# Patient Record
Sex: Female | Born: 1940 | Race: White | Hispanic: No | Marital: Married | Smoking: Former smoker
Health system: Southern US, Community
[De-identification: ages and names within clinical notes are randomized; demographics above are authoritative.]

## PROBLEM LIST (undated history)

## (undated) DIAGNOSIS — R739 Hyperglycemia, unspecified: Secondary | ICD-10-CM

## (undated) DIAGNOSIS — K5730 Diverticulosis of large intestine without perforation or abscess without bleeding: Secondary | ICD-10-CM

## (undated) DIAGNOSIS — K649 Unspecified hemorrhoids: Secondary | ICD-10-CM

## (undated) DIAGNOSIS — F0390 Unspecified dementia without behavioral disturbance: Secondary | ICD-10-CM

## (undated) DIAGNOSIS — K5140 Inflammatory polyps of colon without complications: Secondary | ICD-10-CM

## (undated) DIAGNOSIS — Z9221 Personal history of antineoplastic chemotherapy: Secondary | ICD-10-CM

## (undated) DIAGNOSIS — D509 Iron deficiency anemia, unspecified: Secondary | ICD-10-CM

## (undated) DIAGNOSIS — K589 Irritable bowel syndrome without diarrhea: Secondary | ICD-10-CM

## (undated) DIAGNOSIS — K2270 Barrett's esophagus without dysplasia: Secondary | ICD-10-CM

## (undated) DIAGNOSIS — IMO0002 Reserved for concepts with insufficient information to code with codable children: Secondary | ICD-10-CM

## (undated) DIAGNOSIS — K760 Fatty (change of) liver, not elsewhere classified: Secondary | ICD-10-CM

## (undated) DIAGNOSIS — I509 Heart failure, unspecified: Secondary | ICD-10-CM

## (undated) DIAGNOSIS — C50919 Malignant neoplasm of unspecified site of unspecified female breast: Secondary | ICD-10-CM

## (undated) DIAGNOSIS — M1990 Unspecified osteoarthritis, unspecified site: Secondary | ICD-10-CM

## (undated) DIAGNOSIS — K219 Gastro-esophageal reflux disease without esophagitis: Secondary | ICD-10-CM

## (undated) DIAGNOSIS — G4730 Sleep apnea, unspecified: Secondary | ICD-10-CM

## (undated) DIAGNOSIS — C189 Malignant neoplasm of colon, unspecified: Secondary | ICD-10-CM

## (undated) DIAGNOSIS — R740 Nonspecific elevation of levels of transaminase and lactic acid dehydrogenase [LDH]: Secondary | ICD-10-CM

## (undated) DIAGNOSIS — Z923 Personal history of irradiation: Secondary | ICD-10-CM

## (undated) DIAGNOSIS — C220 Liver cell carcinoma: Secondary | ICD-10-CM

## (undated) DIAGNOSIS — I10 Essential (primary) hypertension: Secondary | ICD-10-CM

## (undated) DIAGNOSIS — K449 Diaphragmatic hernia without obstruction or gangrene: Secondary | ICD-10-CM

## (undated) DIAGNOSIS — K7460 Unspecified cirrhosis of liver: Secondary | ICD-10-CM

## (undated) DIAGNOSIS — T7840XA Allergy, unspecified, initial encounter: Secondary | ICD-10-CM

## (undated) DIAGNOSIS — E785 Hyperlipidemia, unspecified: Secondary | ICD-10-CM

## (undated) DIAGNOSIS — C679 Malignant neoplasm of bladder, unspecified: Secondary | ICD-10-CM

## (undated) DIAGNOSIS — K2970 Gastritis, unspecified, without bleeding: Secondary | ICD-10-CM

## (undated) HISTORY — DX: Malignant neoplasm of bladder, unspecified: C67.9

## (undated) HISTORY — DX: Allergy, unspecified, initial encounter: T78.40XA

## (undated) HISTORY — DX: Gastritis, unspecified, without bleeding: K29.70

## (undated) HISTORY — DX: Diaphragmatic hernia without obstruction or gangrene: K44.9

## (undated) HISTORY — DX: Unspecified dementia, unspecified severity, without behavioral disturbance, psychotic disturbance, mood disturbance, and anxiety: F03.90

## (undated) HISTORY — DX: Unspecified cirrhosis of liver: K74.60

## (undated) HISTORY — PX: BREAST BIOPSY: SHX20

## (undated) HISTORY — PX: CARPAL TUNNEL RELEASE: SHX101

## (undated) HISTORY — DX: Inflammatory polyps of colon without complications: K51.40

## (undated) HISTORY — PX: ESOPHAGEAL DILATION: SHX303

## (undated) HISTORY — DX: Gastro-esophageal reflux disease without esophagitis: K21.9

## (undated) HISTORY — DX: Iron deficiency anemia, unspecified: D50.9

## (undated) HISTORY — DX: Hyperglycemia, unspecified: R73.9

## (undated) HISTORY — DX: Unspecified osteoarthritis, unspecified site: M19.90

## (undated) HISTORY — DX: Diverticulosis of large intestine without perforation or abscess without bleeding: K57.30

## (undated) HISTORY — DX: Essential (primary) hypertension: I10

## (undated) HISTORY — DX: Fatty (change of) liver, not elsewhere classified: K76.0

## (undated) HISTORY — DX: Nonspecific elevation of levels of transaminase and lactic acid dehydrogenase (ldh): R74.0

## (undated) HISTORY — DX: Liver cell carcinoma: C22.0

## (undated) HISTORY — DX: Unspecified hemorrhoids: K64.9

## (undated) HISTORY — PX: COLONOSCOPY: SHX174

## (undated) HISTORY — DX: Malignant neoplasm of unspecified site of unspecified female breast: C50.919

## (undated) HISTORY — PX: OTHER SURGICAL HISTORY: SHX169

## (undated) HISTORY — DX: Malignant neoplasm of colon, unspecified: C18.9

## (undated) HISTORY — DX: Reserved for concepts with insufficient information to code with codable children: IMO0002

## (undated) HISTORY — DX: Hyperlipidemia, unspecified: E78.5

## (undated) HISTORY — PX: MASTECTOMY: SHX3

## (undated) HISTORY — DX: Irritable bowel syndrome, unspecified: K58.9

## (undated) HISTORY — DX: Barrett's esophagus without dysplasia: K22.70

## (undated) HISTORY — DX: Sleep apnea, unspecified: G47.30

---

## 1993-05-08 HISTORY — PX: COLON SURGERY: SHX602

## 1998-12-23 ENCOUNTER — Other Ambulatory Visit: Admission: RE | Admit: 1998-12-23 | Discharge: 1998-12-23 | Payer: Self-pay | Admitting: Obstetrics and Gynecology

## 1999-05-09 HISTORY — PX: BREAST SURGERY: SHX581

## 1999-07-07 ENCOUNTER — Encounter (INDEPENDENT_AMBULATORY_CARE_PROVIDER_SITE_OTHER): Payer: Self-pay | Admitting: Specialist

## 1999-07-07 ENCOUNTER — Other Ambulatory Visit: Admission: RE | Admit: 1999-07-07 | Discharge: 1999-07-07 | Payer: Self-pay | Admitting: Obstetrics and Gynecology

## 1999-07-14 ENCOUNTER — Encounter: Admission: RE | Admit: 1999-07-14 | Discharge: 1999-07-14 | Payer: Self-pay | Admitting: Obstetrics and Gynecology

## 1999-07-14 ENCOUNTER — Encounter: Payer: Self-pay | Admitting: Obstetrics and Gynecology

## 1999-11-03 ENCOUNTER — Encounter (INDEPENDENT_AMBULATORY_CARE_PROVIDER_SITE_OTHER): Payer: Self-pay | Admitting: Specialist

## 1999-11-03 ENCOUNTER — Encounter: Admission: RE | Admit: 1999-11-03 | Discharge: 1999-11-03 | Payer: Self-pay | Admitting: Obstetrics and Gynecology

## 1999-11-03 ENCOUNTER — Other Ambulatory Visit: Admission: RE | Admit: 1999-11-03 | Discharge: 1999-11-03 | Payer: Self-pay | Admitting: Obstetrics and Gynecology

## 1999-11-03 ENCOUNTER — Encounter: Payer: Self-pay | Admitting: Obstetrics and Gynecology

## 1999-11-08 ENCOUNTER — Encounter: Admission: RE | Admit: 1999-11-08 | Discharge: 1999-11-08 | Payer: Self-pay | Admitting: General Surgery

## 1999-11-08 ENCOUNTER — Encounter: Payer: Self-pay | Admitting: General Surgery

## 1999-11-14 ENCOUNTER — Encounter: Payer: Self-pay | Admitting: General Surgery

## 1999-11-16 ENCOUNTER — Encounter: Payer: Self-pay | Admitting: General Surgery

## 1999-11-16 ENCOUNTER — Ambulatory Visit (HOSPITAL_COMMUNITY): Admission: RE | Admit: 1999-11-16 | Discharge: 1999-11-16 | Payer: Self-pay | Admitting: General Surgery

## 1999-11-16 ENCOUNTER — Encounter (INDEPENDENT_AMBULATORY_CARE_PROVIDER_SITE_OTHER): Payer: Self-pay | Admitting: Specialist

## 1999-12-05 ENCOUNTER — Ambulatory Visit (HOSPITAL_COMMUNITY): Admission: RE | Admit: 1999-12-05 | Discharge: 1999-12-06 | Payer: Self-pay | Admitting: General Surgery

## 1999-12-05 ENCOUNTER — Encounter: Payer: Self-pay | Admitting: General Surgery

## 1999-12-05 ENCOUNTER — Encounter (INDEPENDENT_AMBULATORY_CARE_PROVIDER_SITE_OTHER): Payer: Self-pay | Admitting: Specialist

## 1999-12-19 ENCOUNTER — Other Ambulatory Visit: Admission: RE | Admit: 1999-12-19 | Discharge: 1999-12-19 | Payer: Self-pay | Admitting: Obstetrics and Gynecology

## 1999-12-22 ENCOUNTER — Ambulatory Visit (HOSPITAL_COMMUNITY): Admission: RE | Admit: 1999-12-22 | Discharge: 1999-12-22 | Payer: Self-pay

## 1999-12-22 ENCOUNTER — Encounter: Payer: Self-pay | Admitting: *Deleted

## 2000-01-12 ENCOUNTER — Encounter (INDEPENDENT_AMBULATORY_CARE_PROVIDER_SITE_OTHER): Payer: Self-pay | Admitting: Specialist

## 2000-01-12 ENCOUNTER — Observation Stay (HOSPITAL_COMMUNITY): Admission: RE | Admit: 2000-01-12 | Discharge: 2000-01-13 | Payer: Self-pay | Admitting: Urology

## 2000-03-09 ENCOUNTER — Encounter: Admission: RE | Admit: 2000-03-09 | Discharge: 2000-06-07 | Payer: Self-pay | Admitting: Radiation Oncology

## 2000-07-03 ENCOUNTER — Encounter (HOSPITAL_COMMUNITY): Admission: RE | Admit: 2000-07-03 | Discharge: 2000-10-01 | Payer: Self-pay | Admitting: *Deleted

## 2000-09-17 ENCOUNTER — Ambulatory Visit (HOSPITAL_COMMUNITY): Admission: RE | Admit: 2000-09-17 | Discharge: 2000-09-17 | Payer: Self-pay | Admitting: *Deleted

## 2000-09-17 ENCOUNTER — Encounter: Payer: Self-pay | Admitting: *Deleted

## 2000-09-18 ENCOUNTER — Encounter: Payer: Self-pay | Admitting: *Deleted

## 2000-09-18 ENCOUNTER — Ambulatory Visit (HOSPITAL_COMMUNITY): Admission: RE | Admit: 2000-09-18 | Discharge: 2000-09-18 | Payer: Self-pay | Admitting: *Deleted

## 2000-12-05 ENCOUNTER — Encounter (INDEPENDENT_AMBULATORY_CARE_PROVIDER_SITE_OTHER): Payer: Self-pay | Admitting: Specialist

## 2000-12-05 ENCOUNTER — Ambulatory Visit (HOSPITAL_COMMUNITY): Admission: RE | Admit: 2000-12-05 | Discharge: 2000-12-05 | Payer: Self-pay | Admitting: Gastroenterology

## 2000-12-07 ENCOUNTER — Encounter: Payer: Self-pay | Admitting: *Deleted

## 2000-12-07 ENCOUNTER — Encounter: Admission: RE | Admit: 2000-12-07 | Discharge: 2000-12-07 | Payer: Self-pay | Admitting: *Deleted

## 2001-01-24 ENCOUNTER — Other Ambulatory Visit: Admission: RE | Admit: 2001-01-24 | Discharge: 2001-01-24 | Payer: Self-pay | Admitting: Obstetrics and Gynecology

## 2001-03-19 ENCOUNTER — Encounter: Admission: RE | Admit: 2001-03-19 | Discharge: 2001-06-17 | Payer: Self-pay | Admitting: Internal Medicine

## 2001-04-10 ENCOUNTER — Ambulatory Visit (HOSPITAL_COMMUNITY): Admission: RE | Admit: 2001-04-10 | Discharge: 2001-04-10 | Payer: Self-pay | Admitting: Gastroenterology

## 2001-04-10 ENCOUNTER — Encounter (INDEPENDENT_AMBULATORY_CARE_PROVIDER_SITE_OTHER): Payer: Self-pay | Admitting: Specialist

## 2001-07-02 ENCOUNTER — Ambulatory Visit (HOSPITAL_BASED_OUTPATIENT_CLINIC_OR_DEPARTMENT_OTHER): Admission: RE | Admit: 2001-07-02 | Discharge: 2001-07-02 | Payer: Self-pay | Admitting: Orthopedic Surgery

## 2001-09-20 ENCOUNTER — Encounter: Payer: Self-pay | Admitting: *Deleted

## 2001-09-20 ENCOUNTER — Ambulatory Visit (HOSPITAL_COMMUNITY): Admission: RE | Admit: 2001-09-20 | Discharge: 2001-09-20 | Payer: Self-pay | Admitting: *Deleted

## 2001-12-17 ENCOUNTER — Encounter: Payer: Self-pay | Admitting: *Deleted

## 2001-12-17 ENCOUNTER — Encounter: Admission: RE | Admit: 2001-12-17 | Discharge: 2001-12-17 | Payer: Self-pay

## 2002-02-24 ENCOUNTER — Other Ambulatory Visit: Admission: RE | Admit: 2002-02-24 | Discharge: 2002-02-24 | Payer: Self-pay | Admitting: Obstetrics and Gynecology

## 2002-04-08 ENCOUNTER — Ambulatory Visit (HOSPITAL_BASED_OUTPATIENT_CLINIC_OR_DEPARTMENT_OTHER): Admission: RE | Admit: 2002-04-08 | Discharge: 2002-04-08 | Payer: Self-pay | Admitting: Orthopedic Surgery

## 2002-04-09 ENCOUNTER — Encounter: Payer: Self-pay | Admitting: Gastroenterology

## 2002-04-09 ENCOUNTER — Ambulatory Visit (HOSPITAL_COMMUNITY): Admission: RE | Admit: 2002-04-09 | Discharge: 2002-04-09 | Payer: Self-pay | Admitting: Gastroenterology

## 2002-04-16 ENCOUNTER — Encounter (INDEPENDENT_AMBULATORY_CARE_PROVIDER_SITE_OTHER): Payer: Self-pay | Admitting: Specialist

## 2002-04-16 ENCOUNTER — Ambulatory Visit (HOSPITAL_COMMUNITY): Admission: RE | Admit: 2002-04-16 | Discharge: 2002-04-16 | Payer: Self-pay | Admitting: Gastroenterology

## 2002-05-21 ENCOUNTER — Ambulatory Visit (HOSPITAL_COMMUNITY): Admission: RE | Admit: 2002-05-21 | Discharge: 2002-05-21 | Payer: Self-pay | Admitting: Gastroenterology

## 2002-10-13 ENCOUNTER — Ambulatory Visit (HOSPITAL_BASED_OUTPATIENT_CLINIC_OR_DEPARTMENT_OTHER): Admission: RE | Admit: 2002-10-13 | Discharge: 2002-10-13 | Payer: Self-pay | Admitting: General Surgery

## 2002-12-19 ENCOUNTER — Encounter: Payer: Self-pay | Admitting: *Deleted

## 2002-12-19 ENCOUNTER — Encounter: Admission: RE | Admit: 2002-12-19 | Discharge: 2002-12-19 | Payer: Self-pay | Admitting: *Deleted

## 2003-03-26 ENCOUNTER — Other Ambulatory Visit: Admission: RE | Admit: 2003-03-26 | Discharge: 2003-03-26 | Payer: Self-pay | Admitting: Obstetrics and Gynecology

## 2003-07-08 DIAGNOSIS — C189 Malignant neoplasm of colon, unspecified: Secondary | ICD-10-CM

## 2003-07-08 HISTORY — DX: Malignant neoplasm of colon, unspecified: C18.9

## 2003-07-13 ENCOUNTER — Encounter: Admission: RE | Admit: 2003-07-13 | Discharge: 2003-07-13 | Payer: Self-pay | Admitting: General Surgery

## 2003-07-20 ENCOUNTER — Inpatient Hospital Stay (HOSPITAL_COMMUNITY): Admission: RE | Admit: 2003-07-20 | Discharge: 2003-07-26 | Payer: Self-pay | Admitting: General Surgery

## 2003-07-20 ENCOUNTER — Encounter (INDEPENDENT_AMBULATORY_CARE_PROVIDER_SITE_OTHER): Payer: Self-pay | Admitting: Specialist

## 2003-08-19 ENCOUNTER — Ambulatory Visit (HOSPITAL_COMMUNITY): Admission: RE | Admit: 2003-08-19 | Discharge: 2003-08-19 | Payer: Self-pay | Admitting: Oncology

## 2003-08-24 ENCOUNTER — Encounter: Admission: RE | Admit: 2003-08-24 | Discharge: 2003-08-24 | Payer: Self-pay | Admitting: Oncology

## 2003-09-15 ENCOUNTER — Ambulatory Visit (HOSPITAL_BASED_OUTPATIENT_CLINIC_OR_DEPARTMENT_OTHER): Admission: RE | Admit: 2003-09-15 | Discharge: 2003-09-15 | Payer: Self-pay | Admitting: General Surgery

## 2003-09-15 ENCOUNTER — Ambulatory Visit (HOSPITAL_COMMUNITY): Admission: RE | Admit: 2003-09-15 | Discharge: 2003-09-15 | Payer: Self-pay | Admitting: General Surgery

## 2003-11-11 ENCOUNTER — Ambulatory Visit (HOSPITAL_COMMUNITY): Admission: RE | Admit: 2003-11-11 | Discharge: 2003-11-11 | Payer: Self-pay | Admitting: Oncology

## 2003-12-30 ENCOUNTER — Encounter: Admission: RE | Admit: 2003-12-30 | Discharge: 2003-12-30 | Payer: Self-pay | Admitting: General Surgery

## 2004-03-08 ENCOUNTER — Ambulatory Visit: Payer: Self-pay | Admitting: Oncology

## 2004-04-21 ENCOUNTER — Other Ambulatory Visit: Admission: RE | Admit: 2004-04-21 | Discharge: 2004-04-21 | Payer: Self-pay | Admitting: Obstetrics and Gynecology

## 2004-04-27 ENCOUNTER — Ambulatory Visit: Payer: Self-pay | Admitting: Internal Medicine

## 2004-05-05 ENCOUNTER — Ambulatory Visit (HOSPITAL_COMMUNITY): Admission: RE | Admit: 2004-05-05 | Discharge: 2004-05-05 | Payer: Self-pay | Admitting: Oncology

## 2004-05-11 ENCOUNTER — Ambulatory Visit: Payer: Self-pay | Admitting: Oncology

## 2004-08-09 ENCOUNTER — Ambulatory Visit: Payer: Self-pay | Admitting: Oncology

## 2004-08-22 ENCOUNTER — Encounter: Admission: RE | Admit: 2004-08-22 | Discharge: 2004-08-22 | Payer: Self-pay | Admitting: Oncology

## 2004-08-22 ENCOUNTER — Ambulatory Visit (HOSPITAL_COMMUNITY): Admission: RE | Admit: 2004-08-22 | Discharge: 2004-08-22 | Payer: Self-pay | Admitting: Oncology

## 2004-09-06 ENCOUNTER — Ambulatory Visit: Payer: Self-pay | Admitting: Gastroenterology

## 2004-09-14 ENCOUNTER — Ambulatory Visit: Payer: Self-pay | Admitting: Oncology

## 2004-09-16 ENCOUNTER — Encounter: Admission: RE | Admit: 2004-09-16 | Discharge: 2004-09-16 | Payer: Self-pay | Admitting: Oncology

## 2004-09-26 ENCOUNTER — Ambulatory Visit: Payer: Self-pay | Admitting: Gastroenterology

## 2004-10-12 ENCOUNTER — Ambulatory Visit: Payer: Self-pay | Admitting: Oncology

## 2004-11-25 ENCOUNTER — Ambulatory Visit: Payer: Self-pay | Admitting: Internal Medicine

## 2004-11-28 ENCOUNTER — Ambulatory Visit: Payer: Self-pay | Admitting: Internal Medicine

## 2004-12-06 ENCOUNTER — Ambulatory Visit: Payer: Self-pay | Admitting: Internal Medicine

## 2004-12-20 ENCOUNTER — Ambulatory Visit: Payer: Self-pay | Admitting: Oncology

## 2004-12-20 ENCOUNTER — Encounter: Admission: RE | Admit: 2004-12-20 | Discharge: 2005-03-20 | Payer: Self-pay | Admitting: Internal Medicine

## 2005-01-16 ENCOUNTER — Encounter: Admission: RE | Admit: 2005-01-16 | Discharge: 2005-01-16 | Payer: Self-pay | Admitting: General Surgery

## 2005-01-17 ENCOUNTER — Ambulatory Visit (HOSPITAL_BASED_OUTPATIENT_CLINIC_OR_DEPARTMENT_OTHER): Admission: RE | Admit: 2005-01-17 | Discharge: 2005-01-17 | Payer: Self-pay | Admitting: General Surgery

## 2005-01-17 ENCOUNTER — Encounter (INDEPENDENT_AMBULATORY_CARE_PROVIDER_SITE_OTHER): Payer: Self-pay | Admitting: *Deleted

## 2005-02-14 ENCOUNTER — Ambulatory Visit: Payer: Self-pay | Admitting: Oncology

## 2005-02-20 ENCOUNTER — Ambulatory Visit (HOSPITAL_COMMUNITY): Admission: RE | Admit: 2005-02-20 | Discharge: 2005-02-20 | Payer: Self-pay | Admitting: Oncology

## 2005-02-21 ENCOUNTER — Ambulatory Visit: Payer: Self-pay | Admitting: Oncology

## 2005-04-14 ENCOUNTER — Ambulatory Visit: Payer: Self-pay | Admitting: Oncology

## 2005-04-20 ENCOUNTER — Ambulatory Visit: Payer: Self-pay | Admitting: Oncology

## 2005-05-25 ENCOUNTER — Other Ambulatory Visit: Admission: RE | Admit: 2005-05-25 | Discharge: 2005-05-25 | Payer: Self-pay | Admitting: Obstetrics and Gynecology

## 2005-06-08 ENCOUNTER — Ambulatory Visit: Payer: Self-pay | Admitting: Oncology

## 2005-06-16 ENCOUNTER — Ambulatory Visit: Payer: Self-pay | Admitting: Internal Medicine

## 2005-06-27 ENCOUNTER — Ambulatory Visit: Payer: Self-pay | Admitting: Internal Medicine

## 2005-07-04 ENCOUNTER — Ambulatory Visit: Payer: Self-pay | Admitting: Oncology

## 2005-07-27 ENCOUNTER — Ambulatory Visit: Payer: Self-pay | Admitting: Oncology

## 2005-08-11 ENCOUNTER — Ambulatory Visit (HOSPITAL_COMMUNITY): Admission: RE | Admit: 2005-08-11 | Discharge: 2005-08-11 | Payer: Self-pay | Admitting: Oncology

## 2005-08-21 ENCOUNTER — Encounter: Admission: RE | Admit: 2005-08-21 | Discharge: 2005-08-21 | Payer: Self-pay | Admitting: Oncology

## 2005-08-21 LAB — PROTIME-INR: Protime: 24.5 Seconds — ABNORMAL HIGH (ref 10.6–13.4)

## 2005-08-25 LAB — PROTIME-INR
INR: 1.4 (ref 2.00–3.50)
Protime: 14.7 Seconds (ref 10.6–13.4)

## 2005-09-07 LAB — PROTIME-INR: INR: 1.4 — ABNORMAL LOW (ref 2.00–3.50)

## 2005-09-13 ENCOUNTER — Ambulatory Visit: Payer: Self-pay | Admitting: Oncology

## 2005-09-14 LAB — PROTIME-INR: Protime: 16.5 Seconds — ABNORMAL HIGH (ref 10.6–13.4)

## 2005-09-25 ENCOUNTER — Ambulatory Visit: Payer: Self-pay | Admitting: Internal Medicine

## 2005-09-25 LAB — PROTIME-INR: Protime: 17 Seconds — ABNORMAL HIGH (ref 10.6–13.4)

## 2005-10-03 LAB — PROTIME-INR
INR: 4.2 — ABNORMAL HIGH (ref 2.00–3.50)
Protime: 24.9 Seconds — ABNORMAL HIGH (ref 10.6–13.4)

## 2005-10-17 LAB — PROTIME-INR
INR: 2.5 (ref 2.00–3.50)
Protime: 19 Seconds (ref 10.6–13.4)

## 2005-10-25 ENCOUNTER — Ambulatory Visit: Payer: Self-pay | Admitting: Oncology

## 2005-10-26 LAB — HEPATIC FUNCTION PANEL
Albumin: 4.1 g/dL (ref 3.5–5.2)
Alkaline Phosphatase: 102 U/L (ref 39–117)
Indirect Bilirubin: 0.6 mg/dL (ref 0.0–0.9)
Total Bilirubin: 0.7 mg/dL (ref 0.3–1.2)
Total Protein: 7.2 g/dL (ref 6.0–8.3)

## 2005-10-26 LAB — PROTIME-INR (CHCC SATELLITE)
INR: 2.7 (ref 2.0–3.5)
Protime: 20 Seconds — ABNORMAL HIGH (ref 10.6–13.4)

## 2005-10-30 ENCOUNTER — Ambulatory Visit: Payer: Self-pay | Admitting: Internal Medicine

## 2005-10-30 ENCOUNTER — Ambulatory Visit (HOSPITAL_COMMUNITY): Admission: RE | Admit: 2005-10-30 | Discharge: 2005-10-30 | Payer: Self-pay | Admitting: Oncology

## 2005-11-06 ENCOUNTER — Ambulatory Visit: Payer: Self-pay | Admitting: Oncology

## 2005-11-14 ENCOUNTER — Ambulatory Visit: Payer: Self-pay | Admitting: Internal Medicine

## 2005-11-20 ENCOUNTER — Ambulatory Visit: Payer: Self-pay | Admitting: Internal Medicine

## 2005-12-11 ENCOUNTER — Ambulatory Visit: Payer: Self-pay | Admitting: Internal Medicine

## 2006-01-16 ENCOUNTER — Ambulatory Visit: Payer: Self-pay | Admitting: Oncology

## 2006-01-18 LAB — BASIC METABOLIC PANEL - CANCER CENTER ONLY
BUN, Bld: 19 mg/dL (ref 7–22)
CO2: 29 mEq/L (ref 18–33)
Calcium: 10.3 mg/dL (ref 8.0–10.3)
Glucose, Bld: 130 mg/dL — ABNORMAL HIGH (ref 73–118)
Potassium: 4.2 mEq/L (ref 3.3–4.7)
Sodium: 138 mEq/L (ref 128–145)

## 2006-01-18 LAB — CBC WITH DIFFERENTIAL (CANCER CENTER ONLY)
BASO%: 0.8 % (ref 0.0–2.0)
EOS%: 1.8 % (ref 0.0–7.0)
HCT: 40.6 % (ref 34.8–46.6)
LYMPH#: 3.2 10*3/uL (ref 0.9–3.3)
MCHC: 33.3 g/dL (ref 32.0–36.0)
MONO#: 0.5 10*3/uL (ref 0.1–0.9)
NEUT#: 3.8 10*3/uL (ref 1.5–6.5)
NEUT%: 49.5 % (ref 39.6–80.0)
Platelets: 301 10*3/uL (ref 145–400)
RDW: 11.8 % (ref 10.5–14.6)
WBC: 7.7 10*3/uL (ref 3.9–10.0)

## 2006-01-30 ENCOUNTER — Encounter: Admission: RE | Admit: 2006-01-30 | Discharge: 2006-01-30 | Payer: Self-pay | Admitting: Oncology

## 2006-02-12 ENCOUNTER — Ambulatory Visit: Payer: Self-pay | Admitting: Internal Medicine

## 2006-07-12 ENCOUNTER — Ambulatory Visit: Payer: Self-pay | Admitting: Oncology

## 2006-07-17 LAB — CBC WITH DIFFERENTIAL/PLATELET
BASO%: 0.6 % (ref 0.0–2.0)
Basophils Absolute: 0 10*3/uL (ref 0.0–0.1)
EOS%: 2.6 % (ref 0.0–7.0)
Eosinophils Absolute: 0.2 10*3/uL (ref 0.0–0.5)
HCT: 35.9 % (ref 34.8–46.6)
HGB: 12.3 g/dL (ref 11.6–15.9)
LYMPH%: 29.7 % (ref 14.0–48.0)
MCH: 30.7 pg (ref 26.0–34.0)
MCHC: 34.2 g/dL (ref 32.0–36.0)
MCV: 89.8 fL (ref 81.0–101.0)
MONO#: 0.7 10*3/uL (ref 0.1–0.9)
MONO%: 8.8 % (ref 0.0–13.0)
NEUT#: 4.6 10*3/uL (ref 1.5–6.5)
NEUT%: 58.3 % (ref 39.6–76.8)
Platelets: 262 10*3/uL (ref 145–400)
RBC: 4 10*6/uL (ref 3.70–5.32)
RDW: 13.3 % (ref 11.3–14.5)
WBC: 7.9 10*3/uL (ref 3.9–10.0)
lymph#: 2.3 10*3/uL (ref 0.9–3.3)

## 2006-07-17 LAB — COMPREHENSIVE METABOLIC PANEL
ALT: 58 U/L — ABNORMAL HIGH (ref 0–35)
AST: 48 U/L — ABNORMAL HIGH (ref 0–37)
Albumin: 3.9 g/dL (ref 3.5–5.2)
Alkaline Phosphatase: 100 U/L (ref 39–117)
BUN: 17 mg/dL (ref 6–23)
CO2: 26 mEq/L (ref 19–32)
Calcium: 9.3 mg/dL (ref 8.4–10.5)
Chloride: 100 mEq/L (ref 96–112)
Creatinine, Ser: 0.77 mg/dL (ref 0.40–1.20)
Glucose, Bld: 147 mg/dL — ABNORMAL HIGH (ref 70–99)
Potassium: 4.1 mEq/L (ref 3.5–5.3)
Sodium: 139 mEq/L (ref 135–145)
Total Bilirubin: 0.4 mg/dL (ref 0.3–1.2)
Total Protein: 7 g/dL (ref 6.0–8.3)

## 2006-07-17 LAB — CEA: CEA: 1.1 ng/mL (ref 0.0–5.0)

## 2006-07-17 LAB — LACTATE DEHYDROGENASE: LDH: 139 U/L (ref 94–250)

## 2006-07-19 ENCOUNTER — Ambulatory Visit (HOSPITAL_COMMUNITY): Admission: RE | Admit: 2006-07-19 | Discharge: 2006-07-19 | Payer: Self-pay | Admitting: Oncology

## 2006-07-19 ENCOUNTER — Encounter (INDEPENDENT_AMBULATORY_CARE_PROVIDER_SITE_OTHER): Payer: Self-pay | Admitting: *Deleted

## 2006-07-25 ENCOUNTER — Ambulatory Visit: Payer: Self-pay | Admitting: Oncology

## 2006-08-02 ENCOUNTER — Encounter: Admission: RE | Admit: 2006-08-02 | Discharge: 2006-08-02 | Payer: Self-pay | Admitting: Oncology

## 2006-08-09 ENCOUNTER — Ambulatory Visit: Payer: Self-pay | Admitting: Gastroenterology

## 2006-08-13 DIAGNOSIS — R740 Nonspecific elevation of levels of transaminase and lactic acid dehydrogenase [LDH]: Secondary | ICD-10-CM

## 2006-08-13 DIAGNOSIS — K5730 Diverticulosis of large intestine without perforation or abscess without bleeding: Secondary | ICD-10-CM | POA: Insufficient documentation

## 2006-08-13 DIAGNOSIS — K589 Irritable bowel syndrome without diarrhea: Secondary | ICD-10-CM

## 2006-08-13 DIAGNOSIS — R7989 Other specified abnormal findings of blood chemistry: Secondary | ICD-10-CM | POA: Insufficient documentation

## 2006-09-10 ENCOUNTER — Ambulatory Visit: Payer: Self-pay | Admitting: Gastroenterology

## 2006-09-10 ENCOUNTER — Encounter (INDEPENDENT_AMBULATORY_CARE_PROVIDER_SITE_OTHER): Payer: Self-pay | Admitting: Specialist

## 2007-01-23 ENCOUNTER — Ambulatory Visit: Payer: Self-pay | Admitting: Oncology

## 2007-01-24 ENCOUNTER — Encounter: Payer: Self-pay | Admitting: Internal Medicine

## 2007-01-24 LAB — CANCER ANTIGEN 27.29: CA 27.29: 11 U/mL (ref 0–39)

## 2007-01-24 LAB — CBC WITH DIFFERENTIAL (CANCER CENTER ONLY)
BASO#: 0.1 10*3/uL (ref 0.0–0.2)
EOS%: 2.5 % (ref 0.0–7.0)
Eosinophils Absolute: 0.2 10*3/uL (ref 0.0–0.5)
HCT: 37.7 % (ref 34.8–46.6)
HGB: 12.6 g/dL (ref 11.6–15.9)
LYMPH#: 2.9 10*3/uL (ref 0.9–3.3)
MONO#: 0.6 10*3/uL (ref 0.1–0.9)
NEUT#: 4 10*3/uL (ref 1.5–6.5)
NEUT%: 52.1 % (ref 39.6–80.0)
RBC: 4.17 10*6/uL (ref 3.70–5.32)
WBC: 7.8 10*3/uL (ref 3.9–10.0)

## 2007-01-24 LAB — COMPREHENSIVE METABOLIC PANEL
AST: 59 U/L — ABNORMAL HIGH (ref 0–37)
BUN: 13 mg/dL (ref 6–23)
Calcium: 10.1 mg/dL (ref 8.4–10.5)
Chloride: 105 mEq/L (ref 96–112)
Creatinine, Ser: 0.83 mg/dL (ref 0.40–1.20)
Glucose, Bld: 99 mg/dL (ref 70–99)

## 2007-01-24 LAB — LACTATE DEHYDROGENASE: LDH: 149 U/L (ref 94–250)

## 2007-03-21 ENCOUNTER — Telehealth (INDEPENDENT_AMBULATORY_CARE_PROVIDER_SITE_OTHER): Payer: Self-pay | Admitting: *Deleted

## 2007-04-01 ENCOUNTER — Ambulatory Visit: Payer: Self-pay | Admitting: Internal Medicine

## 2007-04-01 ENCOUNTER — Telehealth (INDEPENDENT_AMBULATORY_CARE_PROVIDER_SITE_OTHER): Payer: Self-pay | Admitting: *Deleted

## 2007-04-15 LAB — CONVERTED CEMR LAB
ALT: 70 units/L — ABNORMAL HIGH (ref 0–35)
AST: 56 units/L — ABNORMAL HIGH (ref 0–37)
Albumin: 3.7 g/dL (ref 3.5–5.2)
BUN: 14 mg/dL (ref 6–23)
Chloride: 97 meq/L (ref 96–112)
Creatinine, Ser: 0.7 mg/dL (ref 0.4–1.2)
Direct LDL: 146.3 mg/dL
GFR calc non Af Amer: 89 mL/min
Hgb A1c MFr Bld: 7.1 % — ABNORMAL HIGH (ref 4.6–6.0)
Microalb Creat Ratio: 16.9 mg/g (ref 0.0–30.0)
Potassium: 4.2 meq/L (ref 3.5–5.1)
Total CHOL/HDL Ratio: 5.7
Triglycerides: 194 mg/dL — ABNORMAL HIGH (ref 0–149)
VLDL: 39 mg/dL (ref 0–40)

## 2007-04-16 ENCOUNTER — Telehealth (INDEPENDENT_AMBULATORY_CARE_PROVIDER_SITE_OTHER): Payer: Self-pay | Admitting: *Deleted

## 2007-04-25 ENCOUNTER — Ambulatory Visit: Payer: Self-pay | Admitting: Internal Medicine

## 2007-04-25 DIAGNOSIS — E782 Mixed hyperlipidemia: Secondary | ICD-10-CM

## 2007-04-25 DIAGNOSIS — C50111 Malignant neoplasm of central portion of right female breast: Secondary | ICD-10-CM

## 2007-04-25 DIAGNOSIS — E1169 Type 2 diabetes mellitus with other specified complication: Secondary | ICD-10-CM

## 2007-04-25 DIAGNOSIS — E669 Obesity, unspecified: Secondary | ICD-10-CM | POA: Insufficient documentation

## 2007-04-25 DIAGNOSIS — C679 Malignant neoplasm of bladder, unspecified: Secondary | ICD-10-CM | POA: Insufficient documentation

## 2007-04-25 DIAGNOSIS — E8881 Metabolic syndrome: Secondary | ICD-10-CM | POA: Insufficient documentation

## 2007-04-25 LAB — CONVERTED CEMR LAB
HDL goal, serum: 40 mg/dL
LDL Goal: 100 mg/dL

## 2007-05-06 ENCOUNTER — Ambulatory Visit: Payer: Self-pay | Admitting: Cardiology

## 2007-05-17 ENCOUNTER — Telehealth (INDEPENDENT_AMBULATORY_CARE_PROVIDER_SITE_OTHER): Payer: Self-pay | Admitting: *Deleted

## 2007-05-30 ENCOUNTER — Ambulatory Visit: Payer: Self-pay

## 2007-05-30 LAB — CONVERTED CEMR LAB
AST: 60 units/L — ABNORMAL HIGH (ref 0–37)
Albumin: 3.7 g/dL (ref 3.5–5.2)
Bilirubin, Direct: 0.2 mg/dL (ref 0.0–0.3)
HDL: 30.9 mg/dL — ABNORMAL LOW (ref >39.0)
LDL Cholesterol: 54 mg/dL (ref 0–99)
VLDL: 26 mg/dL (ref 0–40)

## 2007-06-03 ENCOUNTER — Ambulatory Visit: Payer: Self-pay | Admitting: Cardiology

## 2007-06-05 ENCOUNTER — Telehealth (INDEPENDENT_AMBULATORY_CARE_PROVIDER_SITE_OTHER): Payer: Self-pay | Admitting: *Deleted

## 2007-06-10 ENCOUNTER — Telehealth (INDEPENDENT_AMBULATORY_CARE_PROVIDER_SITE_OTHER): Payer: Self-pay | Admitting: *Deleted

## 2007-06-11 ENCOUNTER — Telehealth (INDEPENDENT_AMBULATORY_CARE_PROVIDER_SITE_OTHER): Payer: Self-pay | Admitting: *Deleted

## 2007-06-24 ENCOUNTER — Encounter: Payer: Self-pay | Admitting: Internal Medicine

## 2007-06-24 ENCOUNTER — Telehealth (INDEPENDENT_AMBULATORY_CARE_PROVIDER_SITE_OTHER): Payer: Self-pay | Admitting: *Deleted

## 2007-06-25 ENCOUNTER — Telehealth (INDEPENDENT_AMBULATORY_CARE_PROVIDER_SITE_OTHER): Payer: Self-pay | Admitting: *Deleted

## 2007-08-01 ENCOUNTER — Ambulatory Visit: Payer: Self-pay | Admitting: Internal Medicine

## 2007-08-08 ENCOUNTER — Encounter: Payer: Self-pay | Admitting: Internal Medicine

## 2007-08-22 ENCOUNTER — Encounter: Admission: RE | Admit: 2007-08-22 | Discharge: 2007-08-22 | Payer: Self-pay | Admitting: Oncology

## 2007-08-30 ENCOUNTER — Ambulatory Visit: Payer: Self-pay | Admitting: Oncology

## 2007-09-03 LAB — COMPREHENSIVE METABOLIC PANEL
ALT: 43 U/L — ABNORMAL HIGH (ref 0–35)
AST: 42 U/L — ABNORMAL HIGH (ref 0–37)
Calcium: 9.6 mg/dL (ref 8.4–10.5)
Chloride: 100 mEq/L (ref 96–112)
Creatinine, Ser: 0.72 mg/dL (ref 0.40–1.20)
Potassium: 4 mEq/L (ref 3.5–5.3)

## 2007-09-03 LAB — CBC WITH DIFFERENTIAL/PLATELET
BASO%: 0.1 % (ref 0.0–2.0)
EOS%: 1.4 % (ref 0.0–7.0)
MCH: 31 pg (ref 26.0–34.0)
MCHC: 34.4 g/dL (ref 32.0–36.0)
NEUT%: 58.4 % (ref 39.6–76.8)
RBC: 4.07 10*6/uL (ref 3.70–5.32)
RDW: 13.3 % (ref 11.3–14.5)
lymph#: 2.5 10*3/uL (ref 0.9–3.3)

## 2007-09-05 ENCOUNTER — Ambulatory Visit: Payer: Self-pay | Admitting: Internal Medicine

## 2007-09-05 ENCOUNTER — Encounter (INDEPENDENT_AMBULATORY_CARE_PROVIDER_SITE_OTHER): Payer: Self-pay | Admitting: *Deleted

## 2007-09-05 ENCOUNTER — Ambulatory Visit (HOSPITAL_COMMUNITY): Admission: RE | Admit: 2007-09-05 | Discharge: 2007-09-05 | Payer: Self-pay | Admitting: Oncology

## 2007-09-05 LAB — CONVERTED CEMR LAB
ALT: 56 units/L — ABNORMAL HIGH (ref 0–35)
AST: 56 units/L — ABNORMAL HIGH (ref 0–37)
Albumin: 3.5 g/dL (ref 3.5–5.2)
Alkaline Phosphatase: 94 units/L (ref 39–117)
Cholesterol: 166 mg/dL (ref 0–200)
LDL Cholesterol: 98 mg/dL (ref 0–99)

## 2007-09-11 ENCOUNTER — Encounter: Payer: Self-pay | Admitting: Internal Medicine

## 2007-09-11 ENCOUNTER — Ambulatory Visit: Payer: Self-pay | Admitting: Oncology

## 2007-09-12 ENCOUNTER — Encounter: Payer: Self-pay | Admitting: Gastroenterology

## 2007-09-17 ENCOUNTER — Encounter (INDEPENDENT_AMBULATORY_CARE_PROVIDER_SITE_OTHER): Payer: Self-pay | Admitting: *Deleted

## 2007-10-28 ENCOUNTER — Ambulatory Visit: Payer: Self-pay

## 2007-10-28 LAB — CONVERTED CEMR LAB
ALT: 48 units/L — ABNORMAL HIGH (ref 0–35)
AST: 48 units/L — ABNORMAL HIGH (ref 0–37)

## 2007-12-12 ENCOUNTER — Telehealth (INDEPENDENT_AMBULATORY_CARE_PROVIDER_SITE_OTHER): Payer: Self-pay | Admitting: *Deleted

## 2007-12-16 ENCOUNTER — Telehealth (INDEPENDENT_AMBULATORY_CARE_PROVIDER_SITE_OTHER): Payer: Self-pay | Admitting: *Deleted

## 2008-04-16 ENCOUNTER — Encounter: Payer: Self-pay | Admitting: Internal Medicine

## 2008-04-23 ENCOUNTER — Telehealth (INDEPENDENT_AMBULATORY_CARE_PROVIDER_SITE_OTHER): Payer: Self-pay | Admitting: *Deleted

## 2008-04-27 ENCOUNTER — Telehealth (INDEPENDENT_AMBULATORY_CARE_PROVIDER_SITE_OTHER): Payer: Self-pay | Admitting: *Deleted

## 2008-04-28 ENCOUNTER — Ambulatory Visit: Payer: Self-pay | Admitting: Oncology

## 2008-04-30 LAB — COMPREHENSIVE METABOLIC PANEL
ALT: 44 U/L — ABNORMAL HIGH (ref 0–35)
AST: 39 U/L — ABNORMAL HIGH (ref 0–37)
Albumin: 4 g/dL (ref 3.5–5.2)
Alkaline Phosphatase: 100 U/L (ref 39–117)
BUN: 11 mg/dL (ref 6–23)
Chloride: 102 mEq/L (ref 96–112)
Potassium: 4.4 mEq/L (ref 3.5–5.3)
Sodium: 140 mEq/L (ref 135–145)

## 2008-04-30 LAB — CBC WITH DIFFERENTIAL/PLATELET
BASO%: 0.4 % (ref 0.0–2.0)
Basophils Absolute: 0 10*3/uL (ref 0.0–0.1)
EOS%: 1.5 % (ref 0.0–7.0)
MCH: 31.2 pg (ref 26.0–34.0)
MCHC: 33.6 g/dL (ref 32.0–36.0)
MCV: 92.8 fL (ref 81.0–101.0)
MONO%: 7.3 % (ref 0.0–13.0)
RBC: 3.96 10*6/uL (ref 3.70–5.32)
RDW: 13.2 % (ref 11.3–14.5)
lymph#: 2.1 10*3/uL (ref 0.9–3.3)

## 2008-04-30 LAB — LACTATE DEHYDROGENASE: LDH: 153 U/L (ref 94–250)

## 2008-04-30 LAB — CEA: CEA: 1.4 ng/mL (ref 0.0–5.0)

## 2008-04-30 LAB — CANCER ANTIGEN 27.29: CA 27.29: 12 U/mL (ref 0–39)

## 2008-05-04 ENCOUNTER — Encounter: Payer: Self-pay | Admitting: Gastroenterology

## 2008-05-07 ENCOUNTER — Ambulatory Visit (HOSPITAL_COMMUNITY): Admission: RE | Admit: 2008-05-07 | Discharge: 2008-05-07 | Payer: Self-pay | Admitting: Oncology

## 2008-05-11 ENCOUNTER — Ambulatory Visit: Payer: Self-pay | Admitting: Internal Medicine

## 2008-05-11 DIAGNOSIS — I10 Essential (primary) hypertension: Secondary | ICD-10-CM

## 2008-05-11 DIAGNOSIS — Z85038 Personal history of other malignant neoplasm of large intestine: Secondary | ICD-10-CM | POA: Insufficient documentation

## 2008-05-11 DIAGNOSIS — Z853 Personal history of malignant neoplasm of breast: Secondary | ICD-10-CM | POA: Insufficient documentation

## 2008-05-11 DIAGNOSIS — Z85828 Personal history of other malignant neoplasm of skin: Secondary | ICD-10-CM

## 2008-05-11 DIAGNOSIS — K625 Hemorrhage of anus and rectum: Secondary | ICD-10-CM | POA: Insufficient documentation

## 2008-05-18 DIAGNOSIS — K219 Gastro-esophageal reflux disease without esophagitis: Secondary | ICD-10-CM

## 2008-05-18 DIAGNOSIS — Z8719 Personal history of other diseases of the digestive system: Secondary | ICD-10-CM

## 2008-05-18 DIAGNOSIS — K449 Diaphragmatic hernia without obstruction or gangrene: Secondary | ICD-10-CM | POA: Insufficient documentation

## 2008-05-22 ENCOUNTER — Ambulatory Visit: Payer: Self-pay | Admitting: Gastroenterology

## 2008-05-22 LAB — CONVERTED CEMR LAB
Basophils Relative: 0.7 % (ref 0.0–3.0)
Eosinophils Relative: 1.5 % (ref 0.0–5.0)
Ferritin: 44.7 ng/mL (ref 10.0–291.0)
Lymphocytes Relative: 26.6 % (ref 12.0–46.0)
Monocytes Relative: 6.7 % (ref 3.0–12.0)
Neutro Abs: 5.3 10*3/uL (ref 1.4–7.7)
Neutrophils Relative %: 64.5 % (ref 43.0–77.0)
Transferrin: 346 mg/dL (ref >=212.0)

## 2008-06-15 ENCOUNTER — Ambulatory Visit: Payer: Self-pay | Admitting: Gastroenterology

## 2008-06-26 ENCOUNTER — Encounter: Payer: Self-pay | Admitting: Gastroenterology

## 2008-07-06 ENCOUNTER — Telehealth (INDEPENDENT_AMBULATORY_CARE_PROVIDER_SITE_OTHER): Payer: Self-pay | Admitting: *Deleted

## 2008-07-10 ENCOUNTER — Telehealth (INDEPENDENT_AMBULATORY_CARE_PROVIDER_SITE_OTHER): Payer: Self-pay | Admitting: *Deleted

## 2008-07-17 ENCOUNTER — Ambulatory Visit: Payer: Self-pay | Admitting: Internal Medicine

## 2008-07-17 DIAGNOSIS — K5732 Diverticulitis of large intestine without perforation or abscess without bleeding: Secondary | ICD-10-CM

## 2008-07-17 DIAGNOSIS — L0291 Cutaneous abscess, unspecified: Secondary | ICD-10-CM

## 2008-07-17 DIAGNOSIS — L0390 Cellulitis, unspecified: Secondary | ICD-10-CM

## 2008-07-17 LAB — CONVERTED CEMR LAB
Bilirubin Urine: NEGATIVE
Blood in Urine, dipstick: NEGATIVE
Glucose, Urine, Semiquant: NEGATIVE
Ketones, urine, test strip: NEGATIVE
Nitrite: NEGATIVE
Protein, U semiquant: NEGATIVE
Specific Gravity, Urine: 1.025
Urobilinogen, UA: 0.2
WBC Urine, dipstick: NEGATIVE
pH: 6

## 2008-07-27 ENCOUNTER — Telehealth: Payer: Self-pay | Admitting: Internal Medicine

## 2008-08-17 ENCOUNTER — Encounter: Payer: Self-pay | Admitting: Internal Medicine

## 2008-08-24 ENCOUNTER — Encounter: Admission: RE | Admit: 2008-08-24 | Discharge: 2008-08-24 | Payer: Self-pay | Admitting: Oncology

## 2008-09-11 ENCOUNTER — Ambulatory Visit: Payer: Self-pay | Admitting: Internal Medicine

## 2008-09-11 DIAGNOSIS — H109 Unspecified conjunctivitis: Secondary | ICD-10-CM | POA: Insufficient documentation

## 2008-09-11 DIAGNOSIS — S058X9A Other injuries of unspecified eye and orbit, initial encounter: Secondary | ICD-10-CM | POA: Insufficient documentation

## 2008-11-05 ENCOUNTER — Telehealth (INDEPENDENT_AMBULATORY_CARE_PROVIDER_SITE_OTHER): Payer: Self-pay | Admitting: *Deleted

## 2008-11-25 ENCOUNTER — Ambulatory Visit: Payer: Self-pay | Admitting: Oncology

## 2008-11-27 LAB — COMPREHENSIVE METABOLIC PANEL
ALT: 39 U/L — ABNORMAL HIGH (ref 0–35)
AST: 42 U/L — ABNORMAL HIGH (ref 0–37)
Albumin: 3.9 g/dL (ref 3.5–5.2)
Alkaline Phosphatase: 95 U/L (ref 39–117)
BUN: 17 mg/dL (ref 6–23)
Calcium: 9.2 mg/dL (ref 8.4–10.5)
Chloride: 103 mEq/L (ref 96–112)
Potassium: 4.2 mEq/L (ref 3.5–5.3)
Sodium: 138 mEq/L (ref 135–145)
Total Protein: 7.2 g/dL (ref 6.0–8.3)

## 2008-11-27 LAB — CBC WITH DIFFERENTIAL/PLATELET
Basophils Absolute: 0 10*3/uL (ref 0.0–0.1)
Eosinophils Absolute: 0.1 10*3/uL (ref 0.0–0.5)
HGB: 12.1 g/dL (ref 11.6–15.9)
LYMPH%: 32.4 % (ref 14.0–49.7)
MCV: 91.6 fL (ref 79.5–101.0)
MONO%: 7.6 % (ref 0.0–14.0)
NEUT#: 4.9 10*3/uL (ref 1.5–6.5)
Platelets: 209 10*3/uL (ref 145–400)
RDW: 13.7 % (ref 11.2–14.5)

## 2008-11-27 LAB — CANCER ANTIGEN 27.29: CA 27.29: 17 U/mL (ref 0–39)

## 2008-12-02 ENCOUNTER — Ambulatory Visit: Payer: Self-pay | Admitting: Oncology

## 2008-12-03 ENCOUNTER — Encounter: Payer: Self-pay | Admitting: Internal Medicine

## 2008-12-31 ENCOUNTER — Telehealth (INDEPENDENT_AMBULATORY_CARE_PROVIDER_SITE_OTHER): Payer: Self-pay | Admitting: *Deleted

## 2009-01-25 ENCOUNTER — Ambulatory Visit: Payer: Self-pay | Admitting: Internal Medicine

## 2009-01-25 DIAGNOSIS — R1032 Left lower quadrant pain: Secondary | ICD-10-CM

## 2009-02-15 ENCOUNTER — Telehealth (INDEPENDENT_AMBULATORY_CARE_PROVIDER_SITE_OTHER): Payer: Self-pay | Admitting: *Deleted

## 2009-03-04 ENCOUNTER — Ambulatory Visit: Payer: Self-pay | Admitting: Cardiology

## 2009-03-08 ENCOUNTER — Encounter (INDEPENDENT_AMBULATORY_CARE_PROVIDER_SITE_OTHER): Payer: Self-pay | Admitting: *Deleted

## 2009-03-08 LAB — CONVERTED CEMR LAB
ALT: 55 units/L — ABNORMAL HIGH (ref 0–35)
Alkaline Phosphatase: 98 units/L (ref 39–117)
Cholesterol: 168 mg/dL (ref 0–200)
LDL Cholesterol: 104 mg/dL — ABNORMAL HIGH (ref 0–99)
Total CHOL/HDL Ratio: 5
Total Protein: 7.7 g/dL (ref 6.0–8.3)

## 2009-03-11 ENCOUNTER — Ambulatory Visit: Payer: Self-pay | Admitting: Internal Medicine

## 2009-04-27 ENCOUNTER — Emergency Department (HOSPITAL_COMMUNITY): Admission: EM | Admit: 2009-04-27 | Discharge: 2009-04-27 | Payer: Self-pay | Admitting: Emergency Medicine

## 2009-04-29 ENCOUNTER — Telehealth (INDEPENDENT_AMBULATORY_CARE_PROVIDER_SITE_OTHER): Payer: Self-pay | Admitting: *Deleted

## 2009-05-17 ENCOUNTER — Encounter: Payer: Self-pay | Admitting: Internal Medicine

## 2009-06-24 ENCOUNTER — Telehealth (INDEPENDENT_AMBULATORY_CARE_PROVIDER_SITE_OTHER): Payer: Self-pay | Admitting: *Deleted

## 2009-06-29 ENCOUNTER — Ambulatory Visit: Payer: Self-pay | Admitting: Oncology

## 2009-07-01 LAB — COMPREHENSIVE METABOLIC PANEL WITH GFR
ALT: 46 U/L — ABNORMAL HIGH (ref 0–35)
AST: 52 U/L — ABNORMAL HIGH (ref 0–37)
Albumin: 3.6 g/dL (ref 3.5–5.2)
Alkaline Phosphatase: 112 U/L (ref 39–117)
BUN: 12 mg/dL (ref 6–23)
CO2: 29 meq/L (ref 19–32)
Calcium: 9.4 mg/dL (ref 8.4–10.5)
Chloride: 105 meq/L (ref 96–112)
Creatinine, Ser: 0.8 mg/dL (ref 0.40–1.20)
Glucose, Bld: 161 mg/dL — ABNORMAL HIGH (ref 70–99)
Potassium: 3.7 meq/L (ref 3.5–5.3)
Sodium: 141 meq/L (ref 135–145)
Total Bilirubin: 0.5 mg/dL (ref 0.3–1.2)
Total Protein: 7.9 g/dL (ref 6.0–8.3)

## 2009-07-01 LAB — CBC WITH DIFFERENTIAL/PLATELET
Basophils Absolute: 0 10*3/uL (ref 0.0–0.1)
EOS%: 2.4 % (ref 0.0–7.0)
Eosinophils Absolute: 0.2 10*3/uL (ref 0.0–0.5)
HCT: 37.5 % (ref 34.8–46.6)
HGB: 12.6 g/dL (ref 11.6–15.9)
MONO#: 0.7 10*3/uL (ref 0.1–0.9)
NEUT#: 5.2 10*3/uL (ref 1.5–6.5)
NEUT%: 59 % (ref 38.4–76.8)
RDW: 14 % (ref 11.2–14.5)
WBC: 8.8 10*3/uL (ref 3.9–10.3)
lymph#: 2.7 10*3/uL (ref 0.9–3.3)

## 2009-07-01 LAB — CEA: CEA: 1.9 ng/mL (ref 0.0–5.0)

## 2009-07-01 LAB — CANCER ANTIGEN 27.29: CA 27.29: 16 U/mL (ref 0–39)

## 2009-07-06 ENCOUNTER — Ambulatory Visit: Payer: Self-pay | Admitting: Oncology

## 2009-07-08 ENCOUNTER — Encounter: Payer: Self-pay | Admitting: Internal Medicine

## 2009-07-26 ENCOUNTER — Telehealth (INDEPENDENT_AMBULATORY_CARE_PROVIDER_SITE_OTHER): Payer: Self-pay | Admitting: *Deleted

## 2009-08-27 ENCOUNTER — Encounter: Admission: RE | Admit: 2009-08-27 | Discharge: 2009-08-27 | Payer: Self-pay | Admitting: Oncology

## 2009-09-10 ENCOUNTER — Ambulatory Visit: Payer: Self-pay | Admitting: Cardiology

## 2009-09-10 DIAGNOSIS — E7800 Pure hypercholesterolemia, unspecified: Secondary | ICD-10-CM | POA: Insufficient documentation

## 2009-09-15 ENCOUNTER — Telehealth (INDEPENDENT_AMBULATORY_CARE_PROVIDER_SITE_OTHER): Payer: Self-pay | Admitting: *Deleted

## 2009-09-16 ENCOUNTER — Ambulatory Visit: Payer: Self-pay | Admitting: Internal Medicine

## 2009-09-16 ENCOUNTER — Ambulatory Visit: Payer: Self-pay | Admitting: Family Medicine

## 2009-09-16 DIAGNOSIS — B029 Zoster without complications: Secondary | ICD-10-CM | POA: Insufficient documentation

## 2009-09-20 LAB — CONVERTED CEMR LAB
Alkaline Phosphatase: 111 units/L (ref 39–117)
Bilirubin, Direct: 0.1 mg/dL (ref 0.0–0.3)
HDL: 42.8 mg/dL (ref >39.00)
Total Bilirubin: 0.6 mg/dL (ref 0.3–1.2)
VLDL: 30 mg/dL (ref 0.0–40.0)

## 2009-10-11 ENCOUNTER — Telehealth (INDEPENDENT_AMBULATORY_CARE_PROVIDER_SITE_OTHER): Payer: Self-pay | Admitting: *Deleted

## 2009-10-26 ENCOUNTER — Ambulatory Visit: Payer: Self-pay | Admitting: Internal Medicine

## 2009-10-26 DIAGNOSIS — B0229 Other postherpetic nervous system involvement: Secondary | ICD-10-CM

## 2010-03-15 ENCOUNTER — Ambulatory Visit: Payer: Self-pay | Admitting: Internal Medicine

## 2010-03-15 ENCOUNTER — Telehealth (INDEPENDENT_AMBULATORY_CARE_PROVIDER_SITE_OTHER): Payer: Self-pay | Admitting: *Deleted

## 2010-03-17 ENCOUNTER — Ambulatory Visit: Payer: Self-pay | Admitting: Cardiology

## 2010-03-17 LAB — CONVERTED CEMR LAB
ALT: 48 units/L — ABNORMAL HIGH (ref 0–35)
AST: 45 units/L — ABNORMAL HIGH (ref 0–37)
Albumin: 3.6 g/dL (ref 3.5–5.2)
Bilirubin, Direct: 0.1 mg/dL (ref 0.0–0.3)
LDL Cholesterol: 81 mg/dL (ref 0–99)
Total CHOL/HDL Ratio: 4
Total Protein: 7.1 g/dL (ref 6.0–8.3)
VLDL: 28.2 mg/dL (ref 0.0–40.0)

## 2010-03-21 ENCOUNTER — Ambulatory Visit: Payer: Self-pay | Admitting: Internal Medicine

## 2010-03-30 ENCOUNTER — Encounter: Payer: Self-pay | Admitting: Internal Medicine

## 2010-05-23 ENCOUNTER — Ambulatory Visit
Admission: RE | Admit: 2010-05-23 | Discharge: 2010-05-23 | Payer: Self-pay | Source: Home / Self Care | Attending: Internal Medicine | Admitting: Internal Medicine

## 2010-05-23 DIAGNOSIS — D179 Benign lipomatous neoplasm, unspecified: Secondary | ICD-10-CM | POA: Insufficient documentation

## 2010-05-24 ENCOUNTER — Other Ambulatory Visit: Payer: Self-pay | Admitting: Internal Medicine

## 2010-05-24 ENCOUNTER — Ambulatory Visit
Admission: RE | Admit: 2010-05-24 | Discharge: 2010-05-24 | Payer: Self-pay | Source: Home / Self Care | Attending: Internal Medicine | Admitting: Internal Medicine

## 2010-05-24 LAB — CREATININE, SERUM: Creatinine, Ser: 0.7 mg/dL (ref 0.4–1.2)

## 2010-05-24 LAB — BUN: BUN: 14 mg/dL (ref 6–23)

## 2010-05-25 ENCOUNTER — Telehealth: Payer: Self-pay | Admitting: Internal Medicine

## 2010-05-25 DIAGNOSIS — R42 Dizziness and giddiness: Secondary | ICD-10-CM | POA: Insufficient documentation

## 2010-05-26 ENCOUNTER — Ambulatory Visit: Payer: Self-pay | Admitting: Internal Medicine

## 2010-05-29 ENCOUNTER — Encounter: Payer: Self-pay | Admitting: Oncology

## 2010-06-09 NOTE — Miscellaneous (Signed)
Summary: Orders Update  Clinical Lists Changes  Orders: Added new Test order of TLB-Hepatic/Liver Function Pnl (80076-HEPATIC) - Signed Added new Test order of TLB-Lipid Panel (80061-LIPID) - Signed 

## 2010-06-09 NOTE — Progress Notes (Signed)
Summary: Refill Request  Phone Note Refill Request Message from:  [facility] Fax #: [phone]  Refills Requested: Medication #1:  ALLEGRA 180 MG  TABS 1 by mouth qd   Dosage confirmed as above?Dosage Confirmed   Brand Name Necessary? No   Supply Requested: 6 months Next Appointment Scheduled: none Initial call taken by: Harold Barban,  July 26, 2009 1:33 PM    Prescriptions: ALLEGRA 180 MG  TABS (FEXOFENADINE HCL) 1 by mouth qd  #180 x 1   Entered by:   Shonna Chock   Authorized by:   Marga Melnick MD   Signed by:   Shonna Chock on 07/26/2009   Method used:   Faxed to ...       [facility] (mail-order)             , [place]         Ph:        Fax: [phone]   RxID:   [id number]

## 2010-06-09 NOTE — Progress Notes (Signed)
Summary: RX for dizziness/vertigo  Phone Note Call from Patient Call back at Home Phone [phone]   Summary of Call: Patient left message on triage that she was not given prescription at the time of her office visit for dizziness/vertigo. Patient notes that she has taken this med before but cannot remember the name. Please advise. Initial call taken by: Lucious Groves CMA,  May 25, 2010 2:47 PM  Follow-up for Phone Call        see Rx to FAX for Meclizine Follow-up by: Marga Melnick MD,  May 26, 2010 6:46 AM  Additional Follow-up for Phone Call Additional follow up Details #1::        Patient aware. Additional Follow-up by: Lucious Groves CMA,  May 26, 2010 9:27 AM  New Problems: DIZZINESS (ICD-780.4)   New Problems: DIZZINESS (ICD-780.4) New/Updated Medications: MECLIZINE HCL 25 MG TABS (MECLIZINE HCL) 1/2 - 1 q 8 hrs as needed for dizziness Prescriptions: MECLIZINE HCL 25 MG TABS (MECLIZINE HCL) 1/2 - 1 q 8 hrs as needed for dizziness  #30 x 0   Entered by:   Lucious Groves CMA   Authorized by:   Marga Melnick MD   Signed by:   Lucious Groves CMA on 05/26/2010   Method used:   Electronically to        [id number] (retail)       [address]       [place], [place]  [address]       Ph: [phone] or [phone]       Fax: [phone]   RxID:   [id number]

## 2010-06-09 NOTE — Progress Notes (Signed)
Summary: Pain Mangement  Phone Note Call from Patient Call back at Home Phone [phone]   Caller: Patient Summary of Call: Patient is still in alot of pain from the shingles on the inside, not the outside, plus the rash is still bad. She would like to have something call in for pain if possible. She is will come in for an appt if needed. She doesn't want to take tylenol around the clock bc she doesn't want her liver enzymes to rise. She also can not take ibupropfen.   [facility] [address] [facility]   Her cell # is: [phone] Initial call taken by: Harold Barban,  October 11, 2009 12:43 PM  Follow-up for Phone Call        Dr.Hopper please advise Follow-up by: Shonna Chock,  October 11, 2009 1:25 PM  Additional Follow-up for Phone Call Additional follow up Details #1::        Gabapentin 100 mg 1 q 8 hrs as needed. Can increase to 2 q 8 hrs as needed after 48 hrs if needed. #60Lovie Chol Additional Follow-up by: Marga Melnick MD,  October 12, 2009 5:29 AM    Additional Follow-up for Phone Call Additional follow up Details #2::    rx faxed to [facility], pt informed .Kandice Hams  October 12, 2009 12:24 PM  Follow-up by: Kandice Hams,  October 12, 2009 12:24 PM  New/Updated Medications: GABAPENTIN 100 MG CAPS (GABAPENTIN) 1 every 8 hours as needed, can increase to 2 every 8 hours as needed after 48 hours if needed Prescriptions: GABAPENTIN 100 MG CAPS (GABAPENTIN) 1 every 8 hours as needed, can increase to 2 every 8 hours as needed after 48 hours if needed  #60 x 0   Entered by:   Kandice Hams   Authorized by:   Marga Melnick MD   Signed by:   Kandice Hams on 10/12/2009   Method used:   Faxed to ...       [id number] (retail[facility]       [address]       [place], [place]  [address]       Ph: [phone] or [phone]       Fax: [phone]   RxID:   [id number]

## 2010-06-09 NOTE — Progress Notes (Signed)
Summary: Refill  Phone Note Refill Request Call back at [phone] Message from:  Fax from Pharmacy [facility]  Refills Requested: Medication #1:  GLIMEPIRIDE 4 MG  TABS take 1 two times a day with 2 largest meals**LABS DUE NOW**   Last Refilled: 04/29/2009    New/Updated Medications: GLIMEPIRIDE 4 MG  TABS (GLIMEPIRIDE) **APPOINTMENT DUE**take 1 two times a day with 2 largest meals**LABS DUE NOW** Prescriptions: GLIMEPIRIDE 4 MG  TABS (GLIMEPIRIDE) **APPOINTMENT DUE**take 1 two times a day with 2 largest meals**LABS DUE NOW**  #180 x 0   Entered by:   Shonna Chock   Authorized by:   Marga Melnick MD   Signed by:   Shonna Chock on 09/15/2009   Method used:   Faxed to ...       [facility] (mail-order)             , [place]         Ph:        Fax: [phone]   RxID:   [id number]

## 2010-06-09 NOTE — Progress Notes (Signed)
Summary: Triage: has question about lipid clinic     Phone Note Call from Patient Call back at Home Phone [phone]   Caller: Patient Summary of Call: patient says that DR Alwyn Ren sent her to a lipid clinic on [facility] one or two years ago--wants to know if it is necessary for her to continue going to them or can Dr Alwyn Ren take over for them---she says all they do is give her a prescription for a crestor pill---please call her to advise  She has an appointment with the lipid clinic on Thursday at 3:00 and would like to cancel that appointment if Dr Alwyn Ren will see her instead Initial call taken by: Jerolyn Shin,  March 15, 2010 4:08 PM  Follow-up for Phone Call        she needs to see them & have them request transfer back if cholesterol values are @ goal Follow-up by: Marga Melnick MD,  March 16, 2010 2:16 PM  Additional Follow-up for Phone Call Additional follow up Details #1::        Left message on machine to call back to office. Lucious Groves CMA  March 16, 2010 3:29 PM     Additional Follow-up for Phone Call Additional follow up Details #2::    Patient aware of Dr.Hopper's response and ok'd .Shonna Chock CMA  March 17, 2010 8:14 AM

## 2010-06-09 NOTE — Assessment & Plan Note (Signed)
Summary: feels bad, little nausea for few days; look at  puffy place--...   Vital Signs:  Patient profile:   70 year old female Height:      64.75 inches (164.47 cm) Weight:      180.25 pounds (81.93 kg) BMI:     30.34 Temp:     98.7 degrees F (37.06 degrees C) oral Resp:     15 per minute BP sitting:   130 / 80  (left arm) Cuff size:   regular  Vitals Entered By: Lucious Groves CMA (May 23, 2010 12:54 PM) CC: Vague nausea and puffy place at collar bone./kb Is Patient Diabetic? No Pain Assessment Patient in pain? no      Comments Patient notes that her nausea has been going on for a few days and she has had some sinus congestion. She does need her prescriptions send to [facility] due to insurance. She denies fever, cough, HA, vomiting, SOB, and chest pain.  RX's faxed to [phone] ([facility])   Primary Care Provider:  Marga Melnick, MD  CC:  Vague nausea and puffy place at collar bone./kb.  History of Present Illness:    She is concerned about a possible lymph node @ L clavicular area noted as visable asymmetry in mirror for 3-4 weeks. Upon palpation she questioned a node.  Allergies: No Known Drug Allergies  Review of Systems General:  Denies chills, fatigue, fever, sweats, and weight loss. ENT:  Denies nasal congestion; No purulence , facial pain or frontal headache. Resp:  Denies cough and sputum productive. GI:  Denies abdominal pain, bloody stools, dark tarry stools, and indigestion; Minor nausea 1 week. Derm:  Denies lesion(s) and rash. Heme:  Denies abnormal bruising and bleeding.  Physical Exam  General:  in no acute distress; alert,appropriate and cooperative throughout examination Neck:  No deformities  or tenderness noted. 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 1 two times a day 2)  Janumet 50-1000 Mg Tabs (Sitagliptin-metformin hcl) .... One tablet by mouth two times a day 3)  Bisoprolol Fumarate 5 Mg Tabs (Bisoprolol fumarate) .... Take one tablet daily. 4)  Prilosec Otc 20 Mg Tbec (Omeprazole magnesium) .Marland Kitchen.. 1 by mouth qd 5)  Paxil 20 Mg Tabs (Paroxetine hcl) .Marland Kitchen.. 1 by mouth qd 6)  Allegra 180 Mg Tabs (Fexofenadine hcl) .Marland Kitchen.. 1 by mouth qd 7)  Asa 81mg   .... Once daily 8)  Lomotil  .... Prn 9)  One Touch Ultra Strips 100's  .... Use twice daily 10)  Analpram-hc 1-2.5 % Crea (Hydrocortisone ace-pramoxine) .... Apply to rectum two times a day to three times a day as needed 11)  Crestor 10 Mg Tabs (Rosuvastatin calcium) .... Take one-half tablet by mouth daily. 12)  Lantus 100 Unit/ml Soln (Insulin glargine) .Marland Kitchen.. 10 units subcutaneously once a day. 13)  Fish Oil 1000 Mg Caps (Omega-3 fatty acids) .... Take 1 capsule daily. 14)  Gabapentin 300 Mg Caps (gabapentin)  .Marland Kitchen.. 1-2  every 8 hours as needed  15)  Lidoderm 5 % Ptch (Lidocaine) .... Apply  for up to   12 hrs once daily 16)  Omeprazole 20 Mg Cpdr (Omeprazole) .Marland Kitchen.. 1 once daily 30 min pre b'fast  Other Orders: Prescription Created Electronically [id number])  Patient Instructions: 1)  The neck CT will be scheduled. Omeprazole & Bisoprolol will be sent to [facility] when agency FAX  verified. Prescriptions: OMEPRAZOLE 20 MG CPDR (OMEPRAZOLE) 1 once daily 30 min pre b'fast  #90 x 1   Entered by:   Shonna Chock CMA   Authorized by:   Marga Melnick MD   Signed by:   Shonna Chock  CMA on 05/23/2010   Method used:   Print then Give to Patient   RxID:   [id number] BISOPROLOL FUMARATE 5 MG  TABS (BISOPROLOL FUMARATE) Take one tablet daily.  #90 Tablet x 1   Entered by:   Shonna Chock CMA   Authorized by:   Marga Melnick MD   Signed by:   Shonna Chock CMA on 05/23/2010   Method used:   Print then Give to Patient   RxID:   [id number]    Orders Added: 1)  Est. Patient Level III [02725] 2)  Radiology Referral [Radiology] 3)  Prescription Created Electronically [id number]

## 2010-06-09 NOTE — Assessment & Plan Note (Signed)
Summary: Ongoing Pain from shingles/scm   Vital Signs:  Patient profile:   70 year old female Height:      64.75 inches Weight:      183 pounds BMI:     30.80 BSA:     1.90 Pulse rate:   76 / minute Pulse rhythm:   regular Resp:     17 per minute BP sitting:   128 / 64  (left arm) Cuff size:   large  Vitals Entered By: Ok Anis CMA (October 26, 2009 12:09 PM)  Primary Care Provider:  Marga Melnick, MD  CC:  Pain after shingles .  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  Current Medications (verified): 1)  Glimepiride 4 Mg  Tabs (Glimepiride) .... **appointment Due**take 1 Two Times A Day With 2 Largest Meals**labs Due Now** 2)  Janumet 50-1000 Mg Tabs (Sitagliptin-Metformin Hcl) .... One Tablet By Mouth Two Times A Day 3)  Bisoprolol Fumarate 5 Mg  Tabs (Bisoprolol Fumarate) .... Take One Tablet Daily. 4)  Prilosec Otc 20 Mg  Tbec (Omeprazole Magnesium) .Marland Kitchen.. 1 By Mouth Qd 5)  Paxil 20 Mg  Tabs (Paroxetine Hcl) .Marland Kitchen.. 1 By Mouth Qd 6)  Allegra 180 Mg  Tabs (Fexofenadine Hcl) .Marland Kitchen.. 1 By Mouth Qd 7)  Asa 81mg  .... Once Daily 8)  Lomotil .... Prn 9)  One Touch Ultra Strips  100's .... Use Twice Daily 10)  Analpram-Hc 1-2.5 % Crea (Hydrocortisone Ace-Pramoxine) .... Apply To Rectum Two Times A Day To Three Times A Day As Needed 11)  Crestor 10 Mg Tabs (Rosuvastatin Calcium) .... Take One-Half Tablet By Mouth Daily. 12)  Lantus 100 Unit/ml Soln (Insulin Glargine) .Marland Kitchen.. 10 Units Subcutaneously Once A Day. 13)  Fish Oil 1000 Mg Caps (Omega-3 Fatty Acids) .... Take 1 Capsule Daily. 14)  Gabapentin 100 Mg Caps (Gabapentin) .Marland Kitchen.. 1 Every 8 Hours As Needed, Can Increase To 2 Every 8 Hours As Needed After 48 Hours If Needed  Allergies (verified): No Known Drug Allergies  Review of Systems General:  Denies chills, fever, and sweats. CV:  Denies chest pain or discomfort and shortness of  breath with exertion. Derm:  Rash remnants L chest; minor itching diffusely.  Physical Exam  General:  in no acute distress; alert,appropriate and cooperative throughout examination Lungs:  Normal respiratory effort, chest expands symmetrically. Lungs are clear to auscultation, no crackles or wheezes. Heart:  Normal rate and regular rhythm. S1 and S2 normal without gallop, murmur, click, rub or other extra sounds. S4 Skin:  Post herpetic scarring  T2 area on L Cervical Nodes:  No lymphadenopathy noted Axillary Nodes:  No palpable lymphadenopathy. Post op changes R axilla   Impression & Recommendations:  Problem # 1:  POSTHERPETIC NEURALGIA (ICD-053.19)  Complete Medication List: 1)  Glimepiride 4 Mg Tabs (Glimepiride) .... **appointment due**take 1 two times a day with 2 largest meals**labs due now** 2)  Janumet 50-1000 Mg Tabs (Sitagliptin-metformin hcl) .... One tablet by mouth two times a day 3)  Bisoprolol Fumarate 5 Mg Tabs (Bisoprolol fumarate) .... Take one tablet daily. 4)  Prilosec Otc 20 Mg Tbec (Omeprazole magnesium) .Marland Kitchen.. 1 by mouth qd 5)  Paxil 20 Mg Tabs (Paroxetine hcl) .Marland Kitchen.. 1 by mouth qd 6)  Allegra 180 Mg Tabs (Fexofenadine hcl) .Marland Kitchen.. 1 by mouth qd 7)  Asa 81mg   .... Once daily 8)  Lomotil  .Marland KitchenMarland KitchenMarland Kitchen  Prn 9)  One Touch Ultra Strips 100's  .... Use twice daily 10)  Analpram-hc 1-2.5 % Crea (Hydrocortisone ace-pramoxine) .... Apply to rectum two times a day to three times a day as needed 11)  Crestor 10 Mg Tabs (Rosuvastatin calcium) .... Take one-half tablet by mouth daily. 12)  Lantus 100 Unit/ml Soln (Insulin glargine) .Marland Kitchen.. 10 units subcutaneously once a day. 13)  Fish Oil 1000 Mg Caps (Omega-3 fatty acids) .... Take 1 capsule daily. 14)  Gabapentin 300 Mg Caps (gabapentin)  .Marland Kitchen.. 1-2  every 8 hours as needed 15)  Lidoderm 5 % Ptch (Lidocaine) .... Apply  for up to   12 hrs once daily 16)  Omeprazole 20 Mg Cpdr (Omeprazole) .Marland Kitchen.. 1 once daily 30 min pre b'fast  Patient  Instructions: 1)  Do not exceed 2700 mg of Neurontin daily as 3 divided doses. Prescriptions: OMEPRAZOLE 20 MG CPDR (OMEPRAZOLE) 1 once daily 30 min pre b'fast  #90 x 1   Entered and Authorized by:   Marga Melnick MD   Signed by:   Marga Melnick MD on 10/26/2009   Method used:   Print then Give to Patient   RxID:   [id number] LIDODERM 5 % PTCH (LIDOCAINE) apply  for up to   12 hrs once daily  #30 x 1   Entered and Authorized by:   Marga Melnick MD   Signed by:   Marga Melnick MD on 10/26/2009   Method used:   Print then Give to Patient   RxID:   [id number] GABAPENTIN 300 MG CAPS (GABAPENTIN) 1-2  every 8 hours as needed  #90 x 1   Entered and Authorized by:   Marga Melnick MD   Signed by:   Marga Melnick MD on 10/26/2009   Method used:   Faxed to ...       [id number] ([facility]       [address]       [place], [place]  [address]       Ph: [phone] or [phone]       Fax: [phone]   RxID:   [id number]

## 2010-06-09 NOTE — Letter (Signed)
Summary: [facility]   Imported By: Lanelle Bal 06/07/2009 11:29:19  _____________________________________________________________________  External Attachment:    Type:   Image     Comment:   External Document

## 2010-06-09 NOTE — Assessment & Plan Note (Signed)
Summary: lipid      Allergies Added: NKDA  Visit Type:  Follow-up   Lipid Clinic Follow-Up:   Pt presents for dyslipidemia follow-up.  Pts current regimen is Crestor 5 mg at bedtime and fish oil capsule once daily. Pt is tolerating regimen well but expresses some concerns about the cost.  She has tried Crestor 10 mg at bedtime in the past but experienced muscle pain which requried her to return to the lower dose of 5 mg.   Diet:  Breakfast: typically shredded wheats with whole milk or Oatmeal w/ splenda and whole milk or bagel with regular cream cheese and coffee with coffee mate. Lunch: liver cheese sandwhich with white bread or hot dogs w/ no bun Dinner is hamburger or pork ribs or stewed chicken w/ regular noodles and vegtables (corn, greens, green beans, brussel sprouts, and fried okra) Snack:almonds, canned fruit (no sugar added) w/ cottage cheese, and candy Drinks: diet/caffeine free Dr. Reino Kent and coffee  Exercise:Typically 1x/wk golf   Lipid Management Provider  Lyna Poser, PharmD and Hoy Register, PharmD Candidate  Current Medications (verified): 1)  Glimepiride 4 Mg  Tabs (Glimepiride) .... **appointment Due**take 1 Two Times A Day With 2 Largest Meals**labs Due Now** 2)  Janumet 50-1000 Mg Tabs (Sitagliptin-Metformin Hcl) .... One Tablet By Mouth Two Times A Day 3)  Bisoprolol Fumarate 5 Mg  Tabs (Bisoprolol Fumarate) .... Take One Tablet Daily. 4)  Prilosec Otc 20 Mg  Tbec (Omeprazole Magnesium) .Marland Kitchen.. 1 By Mouth Qd 5)  Paxil 20 Mg  Tabs (Paroxetine Hcl) .Marland Kitchen.. 1 By Mouth Qd 6)  Allegra 180 Mg  Tabs (Fexofenadine Hcl) .Marland Kitchen.. 1 By Mouth Qd 7)  Asa 81mg  .... Once Daily 8)  Lomotil .... Prn 9)  One Touch Ultra Strips  100's .... Use Twice Daily 10)  Analpram-Hc 1-2.5 % Crea (Hydrocortisone Ace-Pramoxine) .... Apply To Rectum Two Times A Day To Three Times A Day As Needed 11)  Crestor 10 Mg Tabs (Rosuvastatin Calcium) .... Take One-Half Tablet By Mouth Daily. 12)  Lantus 100  Unit/ml Soln (Insulin Glargine) .Marland Kitchen.. 10 Units Subcutaneously Once A Day. 13)  Fish Oil 1000 Mg Caps (Omega-3 Fatty Acids) .... Take 1 Capsule Daily. 14)  Gabapentin 300 Mg Caps (Gabapentin) .Marland Kitchen.. 1-2  Every 8 Hours As Needed 15)  Lidoderm 5 % Ptch (Lidocaine) .... Apply  For Up To   12 Hrs Once Daily 16)  Omeprazole 20 Mg Cpdr (Omeprazole) .Marland Kitchen.. 1 Once Daily 30 Min Pre B'fast  Allergies (verified): No Known Drug Allergies   Vital Signs:  Patient profile:   70 year old female Height:      64.75 inches Weight:      180.75 pounds BMI:     30.42 Pulse rate:   84 / minute BP sitting:   122 / 82  (left arm)  Impression & Recommendations:  Problem # 1:  HYPERLIPIDEMIA (ICD-272.2) Controlled on Crestor 5 mg at bedtime and Fish Oil 1 gm daily. Current lipid panal TC 145, TG 141, HDL 36, LDL 81. HDL goal > 45, LDL goal < 70. Educated on diet, exercise and plate method for portion control. Pt has agreeded to make small changes to diet and exercise to improve her lipid control as opposed to an increase in medication. Set goal to use 2% milk as opposed to whole milk and to eat more oatmeal and shredded wheats as opposed to bagels for breakfast. Pt will also try to substitute red meat such as hot dogs and  hamburger meat with [place] subsitutes, pt will also try to eat more non-fried fish for dinner. Set goal to increase water intake by keeping a glass with her and adding flavor packs or [place] if needed to improve taste. Set goal to increase exercise to 3x/wk as either playing golf or walking for at least 30 mins each day with granddaughter or husband. Pt will return to lipid clinic in 6 months for f/u.   Her updated medication list for this problem includes:    Crestor 10 Mg Tabs (Rosuvastatin calcium) .Marland Kitchen... Take one-half tablet by mouth daily.  Patient Instructions: 1)  It was nice meeting you, keep up the good work 2)  Try to eat more oatmeal and shredded wheats for breakfast 3)  Change milk from whole  milk to 2% milk 4)  Try [place] dogs in place of hot dogs 5)  Try [place] burger instead of cooking with hamburger 6)  Increase fish intake for dinner 7)  Increase exercise to 3 times a week for at least 30 mins 8)  Lab Appointment 04/11 at 11 am 9)  Return to clinic on 04/16 at 2:30 pm

## 2010-06-09 NOTE — Progress Notes (Signed)
Summary: REFILL  Phone Note Refill Request Message from:  Fax from Pharmacy on June 24, 2009 9:17 AM  Refills Requested: Medication #1:  BISOPROLOL FUMARATE 5 MG  TABS Take one tablet daily. [facility] [phone]   Method Requested: Mail to Pharmacy Next Appointment Scheduled: NO APPT Initial call taken by: Barb Merino,  June 24, 2009 9:18 AM    Prescriptions: BISOPROLOL FUMARATE 5 MG  TABS (BISOPROLOL FUMARATE) Take one tablet daily.  #90 x 1   Entered by:   Shonna Chock   Authorized by:   Marga Melnick MD   Signed by:   Shonna Chock on 06/24/2009   Method used:   Faxed to ...       [facility] (mail-order)             , [place]         Ph:        Fax: [phone]   RxID:   [id number]

## 2010-06-09 NOTE — Assessment & Plan Note (Signed)
Summary: [name] [name]   Primary Provider:  Marga Melnick, MD  CC:  dyslipidemia follow-up.  History of Present Illness:  Lipid Clinic Visit      The patient comes in today for dyslipidemia follow-up.  The patient denies medication problems.  Dietary compliance review reveals an overall grade of A. Patient has a very difficult time with choosing healthy dietary choices due to combination of diabetes, dyslipidemia, and short colon (due to diverticulitis and removal after colon cancer).  Despite this challenge, patient attempts to limit fats, choosing lean and hi fiber, low carb options.  Review of exercise habits reveals that the patient is not exercising.  Adjunctive measures being instituted include omega-3 supplements which the patient just recently initiated  ~3 weeks ago.    Patient's current lipid regimen includes Crestor 5 mg daily and Fish Oil 1 capsule daily (started  ~3 weeks ago).  Pt is tolerating this regimen well.  Diet is relatively the same as that reported in previous visit.  Patient did see a dietician to help her with meal planning as it is very difficult to find healthy options given the patient has diabetes, dyslipidemia, and short colon.  Pt reports that dietician was of little help and was challenged by her case.  I have encouraged patient to do the best she can with dietary choices, continuing to choose lean meats, hi fiber options (if tolerating), and watch for low sugar choices.  One specific recommendation for breakfast is a hi protein cereal.  Patient was having a peice of sausage with her coffee as a "low carb" option...hopefully she can choose a hi protein cereal instead.  Patient reports that she does not currently exercise. She is aware that she needs to and would like to start walking and golfing at least twice weekly.    Lipid Management Provider  Eda Keys, PharmD  Preventive Screening-Counseling & Management  Alcohol-Tobacco     Alcohol drinks/day: 0  Smoking Status: quit  Comments: Patient quit smoking >20 yrs ago and says she will never go back.  I have commended her for this.  Current Medications (verified): 1)  Glimepiride 4 Mg  Tabs (Glimepiride) .... **appointment Due**take 1 Two Times A Day With 2 Largest Meals**labs Due Now** 2)  Janumet 50-1000 Mg Tabs (Sitagliptin-Metformin Hcl) .... One Tablet By Mouth Two Times A Day 3)  Bisoprolol Fumarate 5 Mg  Tabs (Bisoprolol Fumarate) .... Take One Tablet Daily. 4)  Prilosec Otc 20 Mg  Tbec (Omeprazole Magnesium) .Marland Kitchen.. 1 By Mouth Qd 5)  Paxil 20 Mg  Tabs (Paroxetine Hcl) .Marland Kitchen.. 1 By Mouth Qd 6)  Allegra 180 Mg  Tabs (Fexofenadine Hcl) .Marland Kitchen.. 1 By Mouth Qd 7)  Asa 81mg  .... Once Daily 8)  Lomotil .... Prn 9)  One Touch Ultra Strips  100's .... Use Twice Daily 10)  Analpram-Hc 1-2.5 % Crea (Hydrocortisone Ace-Pramoxine) .... Apply To Rectum Two Times A Day To Three Times A Day As Needed 11)  Crestor 10 Mg Tabs (Rosuvastatin Calcium) .... Take One-Half Tablet By Mouth Daily. 12)  Lantus 100 Unit/ml Soln (Insulin Glargine) .Marland Kitchen.. 10 Units Subcutaneously Once A Day. 13)  Valacyclovir Hcl 1 Gm Tabs (Valacyclovir Hcl) .Marland Kitchen.. 1 Tab By Mouth Three Times A Day X7 Days 14)  Fish Oil 1000 Mg Caps (Omega-3 Fatty Acids) .... Take 1 Capsule Daily.  Allergies (verified): No Known Drug Allergies  Social History: Alcohol drinks/day:  0   Vital Signs:  Patient profile:   70 year old female Weight:  182 pounds BP sitting:   118 / 82  Impression & Recommendations:  Problem # 1:  HYPERLIPIDEMIA (ICD-272.2) Assessment Improved Lipids:  TC 157 (goal <200), TG 150 (goal <150), HDL 42.8 (goal >50), LDL 84 (goal <100, optimal <70).  TG has worsened slightly from previous value of 143, HDL has improved from 35, LDL has improved from 104.  Patient is frustrated with the challenge of making healthy dietary choices, but will work harder at this.  Patient admits to not exercising, but has set a goal to begin  exercising at least twice per week.  Patient has recently initiated fish oil capsules once daily.  I have encouraged pt to increase these to twice daily.   Plan:     1) Continue making healthy dietary choices, consider a hi protein/low sugar cereal as a break fast option 2) Begin exercising at least twice weekly 3) Continue taking Crestor 5 mg daily 4)  Continue Fish Oil - start at 1 capsule daily, increasing to 1 capsule with each meal as tolerated 5)  Follow-up in 6 months Her updated medication list for this problem includes:    Crestor 10 Mg Tabs (Rosuvastatin calcium) .Marland Kitchen... Take one-half tablet by mouth daily.  Patient Instructions: 1)  Continue making healthy dietary choices.  Low-fat, high fiber options,  consider trying a high fiber/hi protein/low sugar cereal. 2)  Set a goal to exercise at least twice per week, working up to more times weekly if able. 3)  Continue taking Crestor 1/2 tablet daily. 4)  Continue taking Fish Oil Capsules - start one capsule daily, then increase to one capsule with each meal if tolerating.   5)  Return to clinic in 6 months.   6)  Fasting Labwork on 03/14/10 @ 11:00 7)  Lipid Clinic on 03/17/10 @ 3:00 pm Prescriptions: CRESTOR 10 MG TABS (ROSUVASTATIN CALCIUM) Take one-half tablet by mouth daily.  #45 x 3   Entered by:   Eda Keys   Authorized by:   Hillis Range, MD   Signed by:   Eda Keys on 09/16/2009   Method used:   Faxed to ...       [facility] (mail-order)             , [place]         Ph:        Fax: [phone]   RxID:   [id number]

## 2010-06-09 NOTE — Assessment & Plan Note (Signed)
Summary: rash/cbs   Vital Signs:  Patient profile:   70 year old female Height:      64.75 inches Weight:      183 pounds BMI:     30.80 Temp:     98.1 degrees F oral BP sitting:   120 / 80  (left arm)  Vitals Entered By: Doristine Devoid (Sep 16, 2009 11:00 AM) CC: rash on chest, under arm , and on back very red and some discomfort    History of Present Illness: 70 yo woman here today for rash.  sxs started 2 days ago.  on L chest, flank, back.  awoke from sleep 2 nights ago w/ constant discomfort in back.  rash appeared yesterday, + vesicles.  was told by friend while golfing today that it looked like shingles.  denies severe pain, + discomfort.  Allergies (verified): No Known Drug Allergies  Review of Systems      See HPI  Physical Exam  General:  in no acute distress; alert,appropriate and cooperative throughout examination Skin:  vesicular rash in dermatomal pattern   Impression & Recommendations:  Problem # 1:  SHINGLES (ICD-053.9) Assessment New start Valtrex.  discussed importance of vaccine once we have doses available.  reviewed signs of infxn.  reviewed supportive care and red flags that should prompt return.  Pt expresses understanding and is in agreement w/ this plan.  Complete Medication List: 1)  Glimepiride 4 Mg Tabs (Glimepiride) .... **appointment due**take 1 two times a day with 2 largest meals**labs due now** 2)  Janumet 50-1000 Mg Tabs (Sitagliptin-metformin hcl) .... One tablet by mouth two times a day 3)  Bisoprolol Fumarate 5 Mg Tabs (Bisoprolol fumarate) .... Take one tablet daily. 4)  Prilosec Otc 20 Mg Tbec (Omeprazole magnesium) .Marland Kitchen.. 1 by mouth qd 5)  Paxil 20 Mg Tabs (Paroxetine hcl) .Marland Kitchen.. 1 by mouth qd 6)  Allegra 180 Mg Tabs (Fexofenadine hcl) .Marland Kitchen.. 1 by mouth qd 7)  Evista 60 Mg Tabs (Raloxifene hcl) .Marland Kitchen.. 1 by mouth qd 8)  Asa 81mg   .... Once daily 9)  Lomotil  .... Prn 10)  One Touch Ultra Strips 100's  .... Use twice daily 11)  Analpram-hc  1-2.5 % Crea (Hydrocortisone ace-pramoxine) .... Apply to rectum two times a day to three times a day as needed 12)  Darvocet-n 50 50-325 Mg Tabs (Propoxyphene n-apap) .Marland Kitchen.. 1-2 q 6 hrs as needed pain 13)  Crestor 10 Mg Tabs (Rosuvastatin calcium) .... Take one-half tablet by mouth daily. 14)  Lantus 100 Unit/ml Soln (Insulin glargine) .Marland Kitchen.. 10 units subcutaneously once a day. 15)  Valacyclovir Hcl 1 Gm Tabs (Valacyclovir hcl) .Marland Kitchen.. 1 tab by mouth three times a day x7 days  Patient Instructions: 1)  This is shingles- please call in early June to see if there is a vaccine available 2)  Take the Valcyclovir as directed 3)  Avoid picking or scratching- you don't want this to get infected 4)  If you develop spreading redness or pus- please call 5)  Tylenol/Ibuprofen as needed for pain 6)  Hang in there!!! Prescriptions: VALACYCLOVIR HCL 1 GM TABS (VALACYCLOVIR HCL) 1 tab by mouth three times a day x7 days  #21 x 0   Entered and Authorized by:   Neena Rhymes MD   Signed by:   Neena Rhymes MD on 09/16/2009   Method used:   Electronically to        [id number] (retail)       [address]  [place], [place]  [address]       Ph: [phone] or [phone]       Fax: [phone]   RxID:   [id number]

## 2010-06-09 NOTE — Medication Information (Signed)
Summary: Fax Regarding Use of ACE or ARB/[facility]  Fax Regarding Use of ACE or [facility]B/[facility]   Imported By: Lanelle Bal 04/08/2010 10:45:29  _____________________________________________________________________  External Attachment:    Type:   Image     Comment:   External Document

## 2010-06-09 NOTE — Letter (Signed)
[facility]   Imported By: Lanelle Bal 07/29/2009 10:47:57  _____________________________________________________________________  External Attachment:    Type:   Image     Comment:   External Document

## 2010-07-05 ENCOUNTER — Telehealth (INDEPENDENT_AMBULATORY_CARE_PROVIDER_SITE_OTHER): Payer: Self-pay | Admitting: *Deleted

## 2010-07-14 NOTE — Progress Notes (Signed)
Summary: refill  Phone Note Refill Request Message from:  Patient on July 05, 2010 1:07 PM  Refills Requested: Medication #1:  BISOPROLOL FUMARATE 5 MG  TABS Take one tablet daily. patient changed pharmacy -[facility] mail order is  [facility] - 90 day supply  Initial call taken by: Okey Regal [place],  July 05, 2010 1:08 PM    Prescriptions: BISOPROLOL FUMARATE 5 MG  TABS (BISOPROLOL FUMARATE) Take one tablet daily.  #90 Tablet x 1   Entered by:   Shonna Chock CMA   Authorized by:   Marga Melnick MD   Signed by:   Shonna Chock CMA on 07/05/2010   Method used:   Faxed to ...       [facility] (mail-order)             , [place]         Ph: [phone]       Fax: [phone]   RxID:   [id number]

## 2010-07-19 ENCOUNTER — Other Ambulatory Visit: Payer: Self-pay | Admitting: Oncology

## 2010-07-19 ENCOUNTER — Encounter (HOSPITAL_BASED_OUTPATIENT_CLINIC_OR_DEPARTMENT_OTHER): Payer: Medicare Other | Admitting: Oncology

## 2010-07-19 DIAGNOSIS — Z85038 Personal history of other malignant neoplasm of large intestine: Secondary | ICD-10-CM

## 2010-07-19 DIAGNOSIS — C50919 Malignant neoplasm of unspecified site of unspecified female breast: Secondary | ICD-10-CM

## 2010-07-19 DIAGNOSIS — Z8551 Personal history of malignant neoplasm of bladder: Secondary | ICD-10-CM

## 2010-07-19 LAB — CBC WITH DIFFERENTIAL/PLATELET
BASO%: 0.3 % (ref 0.0–2.0)
Eosinophils Absolute: 0.1 10*3/uL (ref 0.0–0.5)
HCT: 35.7 % (ref 34.8–46.6)
MCHC: 34.1 g/dL (ref 31.5–36.0)
MONO#: 0.5 10*3/uL (ref 0.1–0.9)
NEUT#: 3.8 10*3/uL (ref 1.5–6.5)
NEUT%: 54.3 % (ref 38.4–76.8)
RBC: 3.98 10*6/uL (ref 3.70–5.45)
WBC: 7.1 10*3/uL (ref 3.9–10.3)
lymph#: 2.6 10*3/uL (ref 0.9–3.3)

## 2010-07-19 LAB — COMPREHENSIVE METABOLIC PANEL
ALT: 44 U/L — ABNORMAL HIGH (ref 0–35)
CO2: 19 mEq/L (ref 19–32)
Calcium: 9.6 mg/dL (ref 8.4–10.5)
Chloride: 101 mEq/L (ref 96–112)
Sodium: 136 mEq/L (ref 135–145)
Total Protein: 7.7 g/dL (ref 6.0–8.3)

## 2010-07-19 LAB — LACTATE DEHYDROGENASE: LDH: 155 U/L (ref 94–250)

## 2010-07-27 ENCOUNTER — Other Ambulatory Visit: Payer: Self-pay | Admitting: General Surgery

## 2010-07-27 DIAGNOSIS — Z853 Personal history of malignant neoplasm of breast: Secondary | ICD-10-CM

## 2010-07-27 DIAGNOSIS — Z9010 Acquired absence of unspecified breast and nipple: Secondary | ICD-10-CM

## 2010-08-08 LAB — COMPREHENSIVE METABOLIC PANEL
ALT: 94 U/L — ABNORMAL HIGH (ref 0–35)
Albumin: 3.8 g/dL (ref 3.5–5.2)
Calcium: 8.7 mg/dL (ref 8.4–10.5)
GFR calc Af Amer: >60 mL/min (ref >60)
Glucose, Bld: 232 mg/dL — ABNORMAL HIGH (ref 70–99)
Sodium: 135 mEq/L (ref 135–145)
Total Protein: 7.8 g/dL (ref 6.0–8.3)

## 2010-08-08 LAB — CBC
Hemoglobin: 13.4 g/dL (ref 12.0–15.0)
MCHC: 33.4 g/dL (ref 30.0–36.0)
Platelets: 233 10*3/uL (ref 150–400)
RDW: 14.2 % (ref 11.5–15.5)

## 2010-08-08 LAB — DIFFERENTIAL
Eosinophils Absolute: 0 10*3/uL (ref 0.0–0.7)
Lymphs Abs: 0.7 10*3/uL (ref 0.7–4.0)
Monocytes Absolute: 2.2 10*3/uL — ABNORMAL HIGH (ref 0.1–1.0)
Monocytes Relative: 18 % — ABNORMAL HIGH (ref 3–12)
Neutrophils Relative %: 77 % (ref 43–77)

## 2010-08-08 LAB — URINALYSIS, ROUTINE W REFLEX MICROSCOPIC
Glucose, UA: NEGATIVE mg/dL
Leukocytes, UA: NEGATIVE
pH: 5 (ref 5.0–8.0)

## 2010-08-08 LAB — URINE MICROSCOPIC-ADD ON

## 2010-08-17 ENCOUNTER — Other Ambulatory Visit (INDEPENDENT_AMBULATORY_CARE_PROVIDER_SITE_OTHER): Payer: Medicare Other | Admitting: *Deleted

## 2010-08-17 DIAGNOSIS — Z79899 Other long term (current) drug therapy: Secondary | ICD-10-CM

## 2010-08-17 DIAGNOSIS — E7800 Pure hypercholesterolemia, unspecified: Secondary | ICD-10-CM

## 2010-08-17 LAB — HEPATIC FUNCTION PANEL
ALT: 44 U/L — ABNORMAL HIGH (ref 0–35)
AST: 47 U/L — ABNORMAL HIGH (ref 0–37)
Alkaline Phosphatase: 124 U/L — ABNORMAL HIGH (ref 39–117)
Bilirubin, Direct: 0.2 mg/dL (ref 0.0–0.3)
Total Bilirubin: 0.7 mg/dL (ref 0.3–1.2)

## 2010-08-23 LAB — GLUCOSE, CAPILLARY: Glucose-Capillary: 139 mg/dL — ABNORMAL HIGH (ref 70–99)

## 2010-08-25 ENCOUNTER — Ambulatory Visit: Payer: Self-pay

## 2010-08-25 ENCOUNTER — Ambulatory Visit (INDEPENDENT_AMBULATORY_CARE_PROVIDER_SITE_OTHER): Payer: Medicare Other

## 2010-08-25 DIAGNOSIS — E7800 Pure hypercholesterolemia, unspecified: Secondary | ICD-10-CM

## 2010-08-25 NOTE — Patient Instructions (Signed)
Increase Crestor to 1 tablet on Monday and Friday and 1/2 tablet all other days.   Continue fish oil 1 gm daily  Try to cut down on portion sizes and only eat when you are hungry  Set a consistent routine of walking at least 2 days per week.   Recheck labs in 6 months.

## 2010-08-31 NOTE — Progress Notes (Signed)
HPI:  Pt presents for dyslipidemia follow-up. Pts current regimen is Crestor 5 mg at bedtime and fish oil capsule once daily. Pt is tolerating regimen well but expresses some concerns about the cost.  Explained to pt Crestor is best option for LDL reduction and least risk of myalgias although it is more expensive than other options. She has tried Crestor 10 mg at bedtime in the past but experienced muscle pain which requried her to return to the lower dose of 5 mg.    Pt reports her blood sugars have been elevated lately.  Today, her fasting CBG was 187, which is down from in the 200's recently.  She has increased her dose of insulin to help.   Pt's diet is relatively unchanged since last visit.  She will eat cheese and crackers or oatmeal with coffee with Coffee Mate for breakfast.  Lunch is a pimento and cheese or bologna and cheese sandwich or Progresso soup.  She likes to eat a variety of meats at dinnertime such as ribs, steak, chicken and pork.  Vegetables include corn, broccoli, greens, etc.  She will sometimes eat cereal for a snack.  She did report going to a nutritionalist in the past.  She has had several surgeries to remove parts of her colon due to cancer so she is unable to tolerate most low-cholesterol, low- carbohydrate foods such as salads, fruits and vegetables.   Exercise:Typically 1x/wk golf.  She does try to walk but this is very inconsistent at this time.   Current Outpatient Prescriptions  Medication Sig Dispense Refill  . aspirin 81 MG tablet Take 81 mg by mouth daily.        . bisoprolol (ZEBETA) 5 MG tablet Take 5 mg by mouth daily.        . diphenoxylate-atropine (LOMOTIL) 2.5-0.025 MG per tablet Take 1 tablet by mouth 2 (two) times daily as needed.        . fexofenadine (ALLEGRA) 180 MG tablet Take 180 mg by mouth daily as needed.       . fish oil-omega-3 fatty acids 1000 MG capsule Take 1 g by mouth daily.       . hydrocortisone-pramoxine (ANALPRAM-HC) 2.5-1 % rectal  cream Place 1 application rectally as needed.       . insulin glargine (LANTUS) 100 UNIT/ML injection Inject 55 Units into the skin at bedtime.       . insulin lispro (HUMALOG) 100 UNIT/ML injection Inject 6 Units into the skin 3 (three) times daily before meals.        Marland Kitchen omeprazole (PRILOSEC) 20 MG capsule Take 20 mg by mouth daily.        Marland Kitchen PARoxetine (PAXIL) 20 MG tablet Take 20 mg by mouth every morning.        . rosuvastatin (CRESTOR) 10 MG tablet Take 1/2 tablet every day except take 1 tablet on Monday and Friday.      . sitaGLIPtan-metformin (JANUMET) 50-1000 MG per tablet Take 1 tablet by mouth 2 (two) times daily with a meal.

## 2010-09-01 NOTE — Assessment & Plan Note (Addendum)
Pt's cholesterol slightly increased from November.  TC- 164 (goal<200), TG- 123 (goal<150), HDL- 43.4 (goal>45), and LDL- 96 (goal<70).  LFTs are mildly elevated but stable.  LDL increased.  Will increase Crestor to 10mg  on 2 days of the week and 5mg  all other days.  Will allow pt to continue current diet given her history of intolerance to many foods.  Encouraged pt to walk on a consistent basis, at least 2 days a week in addition to golfing.  Will follow-up in 6 months.

## 2010-09-05 ENCOUNTER — Ambulatory Visit
Admission: RE | Admit: 2010-09-05 | Discharge: 2010-09-05 | Disposition: A | Discharge status: Home / Self Care | Payer: Medicare Other | Source: Ambulatory Visit | Attending: General Surgery | Admitting: General Surgery

## 2010-09-05 DIAGNOSIS — Z853 Personal history of malignant neoplasm of breast: Secondary | ICD-10-CM

## 2010-09-05 DIAGNOSIS — Z9010 Acquired absence of unspecified breast and nipple: Secondary | ICD-10-CM

## 2010-09-20 NOTE — Assessment & Plan Note (Signed)
[facility]                               LIPID CLINIC NOTE   NAME:[name] [name], [name] [name]                       MRN:          [id number]  DATE:06/03/2007                            DOB:          1941/04/25    [name] [name] is seen back in the lipid clinic for further evaluation and  medication titration associated with her hyperlipidemia in the setting  of primary prevention and previous intolerance of Lipitor.  The patient  has been working to increase her walking, though she has not gotten to  her goal of 3 days a week at 30 minutes.  She is looking forward to  playing golf soon.  She has worked on her diet and has had overall  better GI tolerability since her last visit.  The patient's medications  are unchanged from last visit with the exception of addition of Crestor  10 mg daily.  The patient has had no muscle aches or pains, weakness,  fatigue or other problems with this therapy.  Her labs obtained on  May 30, 2007 revealed normal LFTs except AST of 60 which is only  very slightly increased from 56 previously.  ALT of 54 which is much  improved from 70, and her total cholesterol now 111, triglycerides 132,  HDL 30.9, LDL 54 down from 146.3.   PHYSICAL EXAMINATION:  Weight today in the office is 183 pounds, blood  pressure is 120/76, respirations are 16.   ASSESSMENT:  The patient is thrilled with her results and I am pleased  to see how well she is doing with her Crestor 10 mg daily.  She has had  no muscle aches, pains.  Her labs are stable.  She has had no myalgias  or issues at this time.  Therefore, will continue this therapy.  I have  asked the patient to reduce to 5 mg daily and she will call with  questions or problems in the meantime.  We will see her back in 6 weeks  to re verify that her labs are in line.  Thank you for the opportunity  to see this pleasant patient.      Shelby Dubin, PharmD, BCPS, CPP  Electronically Signed      Rollene Rotunda, MD, [facility]  Electronically Signed   MP/MedQ  DD: 06/06/2007  DT: 06/06/2007  Job #: [id number]   cc:   Titus Dubin. Alwyn Ren, MD,FACP,FCCP

## 2010-09-20 NOTE — Assessment & Plan Note (Signed)
[facility]                               LIPID CLINIC NOTE   NAME:[name] [name]                       MRN:          [id number]  DATE:10/11/2007                            DOB:          1940-05-19    TIME OF PHONE CALL:  Is 11:45 a.m.   DATE OF PHONE CALL:  October 11, 2007.   [name] called me yesterday in the afternoon regarding muscle problems  and need for dosing instructions.  I called back in at 4:30 and Mr. [name] [name]  said that the patient had left her cell phone at home as she had gone to  the market.  I asked her to call back today.  I spoke with the patient  about 11:45 and she stated that approximately 8 days ago she  discontinued her Crestor 10 mg every day because of some cranking  sensation in her neck.  She has had resolution of it and no worsening  changes.  The patient has been off of medicine for approximately 8 days.  I have asked her to begin taking 10 mg of Crestor every other day.  She  will call with questions or problems in the meantime.  She will continue  on with labs as planned.  She understands that if she has worsening pain  or problem, or if this recurs, she has to call me that day and come in  for a CK and liver function test that day.  She will call with questions  or problems in the meantime.      Shelby Dubin, PharmD, BCPS, CPP  Electronically Signed      Rollene Rotunda, MD, [facility]  Electronically Signed   MP/MedQ  DD: 10/11/2007  DT: 10/11/2007  Job #: [id number]

## 2010-09-20 NOTE — Assessment & Plan Note (Signed)
[facility]                               LIPID CLINIC NOTE   NAME:[name], [name]                       MRN:          [id number]  DATE:05/06/2007                            DOB:          09/05/1940    [name] [name] is seen in the lipid clinic for further evaluation and  medication titration for primary prevention in the setting of history of  intolerance to Lipitor 40.  [name] [name] is a pleasant lady seen with Tawni Levy, PharmD today in the office.   She has a past medical history that is pertinent for:  1. Breast cancer.  2. Bladder cancer in 2001.  3. Colon cancer in 2005.  4. Skin cancer in 2008.  5. She has other history of diverticulitis.  6. Diabetes, type 2.  7. Irritable bowel syndrome.  8. Hypertension.   CURRENT MEDICATIONS:  1. Allegra 180 mg daily.  2. Amaryl 1 mg twice daily.  3. Calcium with vitamin D 1500 mg daily in divided doses.  4. Glucophage 1,000 mg twice daily.  5. Zetia 10 mg daily.   The patient relates she discontinued Lipitor several years ago due to  increase in her liver enzymes.  She states that she has been prescribed  Arimidex for cancer prevention which she does take some of the time,  though she continues to have problems with this therapy from a  tolerability standpoint associated with hot flashes.   Her other medications include:  1. Lomotil 1-2 tablets daily  2. Prilosec 20 mg daily.  3. Zebeta 5 mg daily.  4. Evista 60 mg daily.  5. Aspirin 81 mg daily.   She has no known drug allergies.  As noted she is intolerant of LIPITOR  due to liver function elevation.   SOCIAL HISTORY:  The patient smokes 2.5 packs of cigarettes daily for 20  years, then she quit 25 years ago.  She drinks a rare half glass of wine  on occasion.  She has been evaluated by a dietician who encouraged her  to decrease her carbohydrate intake as well as her fats and cholesterol  for hyperlipidemia and hypertension and her GI issues.   She states that  very frankly yogurt is the only nutritious thing that the nutritionist  was able to come up that she could eat on a regular basis.  She does  enjoy gold and she is walking 30 minutes periodically.  She wants to  improve on this and we have recommended to her that 30 minutes three  times a week would be her goal.   PHYSICAL EXAMINATION:  VITAL SIGNS:  Weight today is 184 pounds, blood  pressure 130/78, heart rate 64.   LABORATORY:  On April 01, 2007, reveal a total cholesterol 221,  triglycerides 194, HDL 38.7, LDL 146.3.  LFTs slightly elevated, ALT is  70, AST is 56.   ASSESSMENT:  The patient is in need of 50% LDL reduction in order to  obtain primary prevention goals.  The patient is willing and eager to re-  try Statins if her  therapy can be modified in a manner that is tolerable  to her.   PLAN:  1. Dietary therapy, the patient will work to decrease her portion size      and overall calorie intake.  2. The patient will begin walking on a regular basis 30 minutes three      times a week.  3. She will follow up with labs in 3.5 weeks and lipid clinic in 4      weeks to make certain that her therapy does not increase her liver      functions dramatically.  4. From a medication perspective, we will begin Crestor 10 mg daily      and we will decrease the frequency and/or discontinue completely if      myopathy develops or liver functions increase.   I appreciate the opportunity to see this pleasant patient.      Shelby Dubin, PharmD, BCPS, CPP  Electronically Signed      Rollene Rotunda, MD, [facility]  Electronically Signed   MP/MedQ  DD: 06/06/2007  DT: 06/06/2007  Job #: [id number]   cc:   Titus Dubin. Alwyn Ren, MD,FACP,FCCP

## 2010-09-23 NOTE — Op Note (Signed)
NAME:  [name] [name], [name] [name]                          ACCOUNT NO.:  [id number]   MEDICAL RECORD NO.:  [id number]                   PATIENT TYPE:  AMB   LOCATION:  [facility]                                  FACILITY:  [facility]   PHYSICIAN:  Katy Fitch. Naaman Plummer., M.D.          DATE OF BIRTH:  05/01/1941   DATE OF PROCEDURE:  04/08/2002  DATE OF DISCHARGE:                                 OPERATIVE REPORT   PREOPERATIVE DIAGNOSES:  Intraoperative neuropathy median nerve, right  carpal tunnel.   POSTOPERATIVE DIAGNOSIS:  Intraoperative neuropathy median nerve, right  carpal tunnel.   OPERATION:  Release of right transverse scapular ligament.   SURGEON:  Katy Fitch. Sypher, M.D.   ASSISTANT:  Marveen Reeks. Dasnoit, PA-C.   ANESTHESIA:  General by LMA, supervised by anesthesiologist Dr. Michelle Piper.   INDICATIONS:  The patient is a 70 year old woman referred by Lona Kettle,  M.D. for evaluation and management of the painful and numb right hand.  Clinic examination revealed signs of probable entraped neuropathy.  Electrodiagnostic studies confirmed median neuropathy at the level of the  transverse scapular ligament. Due to her failure to respond to nonoperative  measures, she is brought back at this time anticipating release of her right  transverse scapular ligament.   DESCRIPTION OF PROCEDURE:  The patient is brought to the operating room and  placed in the supine position on the operating table.  Following induction  of general anesthesia, the right arm was prepped with Betadine and soap  solution and sterilely draped.  Following exsanguination of the limb with an  Esmarch bandage, arterial current on proximal brachium was delayed to 220  mmHg.  Procedure advanced with a short incision in the line of the ring  finger and palm.  The subcutaneous tissues were carefully divided along the  palmar fascia. This was split longitudinally for ability to accomplish  intervention to the median nerve.  These were  followed back to transverse  carpal ligament which was carefully isolated from the median nerve.  The  ligament was released along its ulnar border extending in the distal  forearm.  This allowed the entry into the carpal canal.  No mass or other  predicaments were noted.  Bleeding points along the margin of released ligament were electrocauterized  with bipolar current followed by repair of the skin with intradermal 0  Prolene suture.  A compressive dressing applied and a volar plaster splint  with fingers in 5 degrees of dorsiflexion.   POSTOPERATIVE CARE:  The patient was given a prescription for Keflex 500 mg  1 p.o. q.8h. x4 days as a prophylactic antibiotic and Percocet 5 mg 1-2  tablets p.o. q.4-6h. p.r.n. pain, #20 tablets without refill.  Katy Fitch Naaman Plummer., M.D.   RVS/MEDQ  D:  04/08/2002  T:  04/08/2002  Job:  [id number]   cc:   Titus Dubin. Alwyn Ren, M.D. [facility]

## 2010-09-23 NOTE — Discharge Summary (Signed)
NAME:  [name] [name], [name] [name]                          ACCOUNT NO.:  [id number]   MEDICAL RECORD NO.:  [id number]                   PATIENT TYPE:  INP   LOCATION:  0440                                 FACILITY:  [facility]   PHYSICIAN:  Anselm Pancoast. Zachery Dakins, M.D.          DATE OF BIRTH:  03-16-1941   DATE OF ADMISSION:  07/20/2003  DATE OF DISCHARGE:  07/26/2003                                 DISCHARGE SUMMARY   DISCHARGE DIAGNOSES:  1. 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  2. History of breast cancer.  3. Diabetes mellitus.   OPERATION:  Extended right colectomy.   HISTORY:  [name] [name] is a 70 year old Caucasian female who I of course met  approximately 10+ years ago when she had recurrent episodes of  diverticulitis.  She had been followed and managed by Dr. Sheryn Bison  and with the reoccurring symptoms she underwent sigmoid colectomy, she did  satisfactorily following this but several years later developed incisional  hernia, this was repaired with mesh and approximately 4 years ago she had  presented with large cancer of the right breast and had numerous positive  nodes and was treated with mastectomy and axillary resection after positive  sentinel node and then has received chemotherapy and is presently on  tamoxifen-type medications.  Patient had a colon polyp and colonoscopy  approximately a little over a year ago that was excised and this was benign,  on followup colonoscopy by Dr. Jarold Motto there was a 3 cm lesion that was  found adenocarcinoma.  Patient has had a few other little polyps in the  remaining colon and it appears that we could do a right colectomy and it was  thought preoperatively that the area was in the hepatic flexure.  The  patient was prepped with GoLYTELY and given neomycin and erythromycin bowel  prep.  We did do a CT that shows no evidence of any metastatic disease in  the liver or abdomen and her chest x-ray is unremarkable.  Patient was  admitted  on July 20, 2003.  Dr. Johna Sheriff assisted and a right colectomy was  performed.  We did have to go through the mesh that had been used for  incisional hernia repair and the lesion was found more in the nearly mid-  transverse colon than the hepatic flexure but we were able to remove it with  an extended right colectomy but still the left pelvic splenic flexure and  descending colon is viable left.  She reports to have some extensive  adhesions on her abdomen as she has had a previous sigmoid colon resection.  She has also had a history of cancer.  Patient's path report showed that  this was a 3 cm moderately differentiated invasive cancer extending through  the muscularis propria and the peritoneal and connective tissue and 22 lymph  nodes were all negative __________ noted in the liver on gross inspection  and this confirms  with the CT findings __________.  Postoperatively she had  an NG tube for past 48 hours, NG tube was removed, we have started her on a  liquid diet, she is a diabetic and was given some insulin sliding coverage.  Blood sugars were approximately 220 to 40-50 range but now her Glucophage  has been resumed and her Accu-Cheks are running in the 150 range which is  what she normally runs.  Her incision is healing satisfactorily and we will  plan on removing the staples in the office Wednesday since this is a repeat  incision with mesh and I will discharge on Vicodin plus she will resume all  of her usual medications.  She has had occasional nausea that has probably  been related to her Tylox which she has received, discharged on Vicodin.  She will need no  further management of this colon cancer except for a colonoscopy in  approximately 1 year and she is followed now by Dr. Welton Flakes having originally  been followed by Dr. Katrinka Blazing.  She is being treated for her breast cancer with  adjuvant chemotherapy.                                               Anselm Pancoast. Zachery Dakins,  M.D.    WJW/MEDQ  D:  07/26/2003  T:  07/28/2003  Job:  [id number]   cc:   Vania Rea. Jarold Motto, M.D. [facility]   Drue Second, MD  Fax: [phone]

## 2010-09-23 NOTE — Op Note (Signed)
[place]. [facility]  Patient:    [name] [name], [name] [name]                       MRN: [id number] Proc. Date: 11/16/99 Adm. Date:  [id number] Attending:  Henrene Dodge CC:         Anselm Pancoast. Zachery Dakins, M.D.             Guy Sandifer Arleta Creek, M.D.                           Operative Report  PREOPERATIVE DIAGNOSIS:  Carcinoma of the right breast.  POSTOPERATIVE DIAGNOSIS:  Carcinoma of the right breast.  OPERATION PERFORMED:  Sentinel lymph node biopsy of the right axilla and lumpectomy.  SURGEON:  Anselm Pancoast. Zachery Dakins, M.D.  ANESTHESIA:  General.  ASSISTANT:  Nurse.  INDICATIONS FOR PROCEDURE:  Shenna Brissette is a 70 year old Caucasian female who was referred to [place] by Dr. Henderson Cloud.  Dr. Marga Melnick is her regular physician, for an abnormal breast mass.  The patient about three months ago had a normal mammogram.  No definite abnormalities on physical exam and then afterwards noticed a little thickening of she said pea-sized in the subareolar area on the right breast.  She returned and had a repeat mammogram that then showed an area of question and was referred to Dr. Doloris Hall and had a core biopsy that confirmed that this is carcinoma.  She saw me in the office approximately 10 days ago.  That was about 24 hours after the biopsy and she had a significant hematoma and I recommended that we definitely plan on doing a sentinel node and a lumpectomy and then would make a definite decision on whether or not she could be managed with radiation therapy or whether she will possibly need a mastectomy.  The lesion is fairly central and on exam when I examined her in the office it was certainly much larger than a pea size but I am not sure how much was related to the hematoma.  The patient over the last 10 days has had a bone scan that was negative.  Her metastatic work-up was negative and on discussing with her preoperatively, she is well aware that if the  margins are not clear with a kind of central lesion, then she may need a mastectomy and reconstruction.  She was given a gram of Ancef preoperatively and taken to the operating room.  DESCRIPTION OF PROCEDURE:  The radionuclide had been injected and on examination of the axilla there was definitely a hot area up under the lateral edge of the pectoralis major muscle ____________ the tumor over the subareolar area was extremely hot on the [employer].  Induction of general anesthesia and I elected not to inject any methylene blue since we have got this hematoma and trying to distinguish what is hematoma and what is tumor which would be more difficult in my opinion when at the blue dye.  She was prepped with Betadine solution and draped in a sterile manner.  The area where the hot area was noted a little small incision was made, sharp dissection down through the skin and subcutaneous.  The lateral edge of the rectus was identified and then right under this was a definite node that was about the size of my middle fingernail but was extremely warm and the node itself and the node itself was quite firm.  I was  worried that it was definitely metastatic cancer and the node was excised and had a count of about 225.  The axilla contents had dropped down significantly but there was another warm area and there was a second node that looked about the same size also quite firm located a little more in the true axillary area that I excised and this one had a count of about 45.  Dr. Delila Spence on doing Touch Preps of both felt that they were both negative for metastatic disease.  The little wound, the little vessels and etc. were hemoclipped during dissection.  No drain was placed and then the fascia of the axilla was closed with interrupted 3-0 chromic and then the skin closed with staples.  I then basically directed my attention to the subareolar mass area, made sort of a curved incision about 1 cm from  the areolar edge and then developed the areola from the underlying mass and I fear that this tumor is really in the duct structures and is not going to be a good one for lumpectomy.  A generous biopsy was made so that all the firm area was removed.  I fear that this is going to be tumor and could possibly be old fibrocystic disease and hematoma.  Previously she had been on estrogen replacement but this was stopped when I saw her in the office 10 days ago. Numerous little blood vessels were cauterized or sutured with 3-0 chromic and this explains why she had the significant hematoma and after good hemostasis had been obtained, the wound was closed in two layers with 3-0 chromic on the inner and then skin staples on the skin.  Dr. Delila Spence will make sure that we get estrogen and progesterone receptors.  After the actual lump had been removed and we removed an area of tissue about the size of an egg, I did go back and actually do a little basically debridement type biopsies in the surrounding tissue of anything that was firm and of question and this was sent down in a second container to basically if any of these are positive, we definitely dont have good margins.  The patient was awakened and sent to the recovery room in stable postoperative condition.  He will be released after a short stay in the recovery room.  I will be in contact with her either tomorrow or Friday morning with the results of the pathology and if we will need further surgery, she will need to see a plastic surgeon for reconstruction after mastectomy. DD:  11/16/99 TD:  11/16/99 Job: [id number]

## 2010-09-23 NOTE — Assessment & Plan Note (Signed)
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  She is not abusing sorbitol or  fructose.  She is due for followup colonoscopy exam, and certainly has  risk for recurrent colonoscopy polyps and carcinoma per her past  history.   RECOMMENDATIONS:  1. Xifaxan 400 mg t.i.d. for 10 days along with Align probiotic      therapy.  2. Outpatient colonoscopy at her convenience.     Vania Rea. Jarold Motto, MD, Caleen Essex, FAGA  Electronically Signed    DRP/MedQ  DD: 08/09/2006  DT: 08/09/2006  Job #: [id number]

## 2010-09-23 NOTE — Op Note (Signed)
[place]. [facility]  Patient:    NELANI, SCHMELZLE Visit Number: [id number] MRN: [id number]          Service Type: DSU Location: [facility] Attending Physician:  Susa Day Dictated by:   Katy Fitch Naaman Plummer., M.D. Proc. Date: 07/02/01 Admit Date:  07/02/2001   CC:         Anselm Pancoast. Zachery Dakins, M.D.   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  OPERATION:  Release of left transverse carpal ligament.  SURGEON:  Katy Fitch. Sypher, Montez Hageman., M.D.  ASSISTANT:  Jonni Sanger, P.A.- C.  ANESTHESIA:  General by LMA.  SUPERVISING ANESTHESIOLOGIST:  Dr. Guadalupe Maple.  INDICATIONS:  Kloie Whiting is a 70 year old woman, who was referred by Dr. Zachery Dakins for evaluation and management of a painful left hand.  Clinical examination revealed signs of entrapment neuropathy of the median nerve consistent with carpal tunnel syndrome.  Due to a failure to respond to nonoperative measures, she is brought to the operating room at this time for release of her left transverse carpal ligament.  DESCRIPTION OF PROCEDURE:  Genever Hentges was brought to the operating room and placed in supine position upon the operating room table.  Following induction of general anesthesia by LMA, the left arm was prepped with Betadine soap and solution, and sterilely draped.  Following exsanguination of the limb with Esmarch bandage, arterial tourniquet was inflated to 220 mmHg. The procedure commenced with a short incision in the line of the ring finger in the palm.  Subcutaneous tissues were carefully divided revealing the palmar fascia.  This was split longitudinally to the reveal common sensory branch of the median nerve.  The ligament was then isolated and released on its ulnar border with scissors extending  into the distal forearm.  This widely opened the carpal canal.  No masses or other predicaments were noted.  Bleeding points along the margin of the released transverse carpal ligament were electrocauterized with bipolar current, followed by repair of the skin with intradermal 3-0 Prolene suture.  A compressive dressing was applied with a volar forearm splint maintaining the wrist in 5 degrees of dorsiflexion. Dictated by:   Katy Fitch Naaman Plummer., M.D. Attending Physician:  Susa Day DD:  07/02/01 TD:  07/02/01 Job: [id number]

## 2010-09-23 NOTE — H&P (Signed)
NAME:  [name] [name], [name] [name]                          ACCOUNT NO.:  [id number]   MEDICAL RECORD NO.:  [id number]                   PATIENT TYPE:  INP   LOCATION:  0440                                 FACILITY:  [facility]   PHYSICIAN:  Anselm Pancoast. Zachery Dakins, M.D.          DATE OF BIRTH:  [birth year]   DATE OF ADMISSION:  07/20/2003  DATE OF DISCHARGE:                                HISTORY & PHYSICAL   CHIEF COMPLAINT:  Colon cancer.   HISTORY:  [name] [name] is a 70 year old Caucasian female referred to me by  Dr. Sheryn Bison for removal of a recently identified colon carcinoma.  The patient is an old patient of mine, first having problems with  diverticulitis and a sigmoid colectomy was performed approximately 10 to 12  years ago.  Several years later, she developed an incisional hernia that was  repaired.  Then approximately 4 years ago, she had carcinoma of the breast  with some positive lymph nodes and received chemotherapy, and has been off  chemotherapy ___________ for the last couple of years.  She was found to  have a mildly elevated glucose approximately a couple of years ago.  Has  been on Glucophage 500 mg b.i.d.  Has some problems with arthritis, for  which she is on Celebrex.  She recently had a colonoscopy and had a polyp  excised approximately a year ago.  At this time, she is known to have a  carcinoma as described in the hepatic flexure.  It was a carcinoma.  I  obtained a CT of the abdomen and pelvis and a chest x-ray with no evidence  of metastatic disease noted, and she is now admitted electively  ____________.   ALLERGIES:  No known drug allergies.   MEDICATIONS:  1. Allegra 100 mg daily.  2. Glucophage 500 mg b.i.d.  3. Starlix ____________.  4. ____________.  5. Celebrex is on hold before surgery.  6. Zebeta 5 mg daily.  7. Lipitor 40 mg daily.  8. Paxil 20 mg daily.  9. Prilosec 20 mg b.i.d.   REVIEW OF SYSTEMS:  Included in the above history.  CARDIAC:  Mild  elevation  of blood pressure.  ___________symptoms.  PULMONARY:  She does not smoke.  MUSCULOSKELETAL:  She has arthritis-type symptoms in the large joints.   PRIMARY CARE PHYSICIAN:  Dr. Alwyn Ren.   GASTROENTEROLOGIST:  Dr. Sheryn Bison.   ONCOLOGIST:  Dr. ____________ is her present oncologist.  She was seen by  Dr. Katrinka Blazing.   PHYSICAL EXAMINATION:  GENERAL:  She is a pleasant female who appears her  stated age in no acute distress.  VITAL SIGNS:  Temperature was 98.1, pulse 88, respirations 16, blood  pressure 120/70.  She is 5 feet 6 inches, and weighs ___________ pounds.  HEENT:  Normocephalic.  Pupils equal, round, reactive to light.  ____________.  NECK:  ____________ lymph nodes.  CARDIAC:  Normal sinus rhythm.  CHEST:  She has symmetric breath sounds bilaterally.  BREASTS:  The right breast is surgically absent.  There is very minimal  edema of the right arm as she has complete axillary dissection.  Left breast  is without masses or abnormalities noted.  She has had previous Port-A-Cath  placement and it has been removed.  There is a scar from that.  ABDOMEN:  No organomegaly or tenderness.  There is a midline incision.  No  evidence of hernia.  PELVIC:  Did not do.  RECTAL:  Did not do.  EXTREMITIES:  No pitting edema.  Good pulses.____________.   IMPRESSION:  1. Carcinoma of the right colon.  2. History of breast cancer with positive nodes presently ___________.   PLAN:  The patient will undergo a right colectomy procedure.  We will do  Accu-Cheks and we will manage her glucose in the postoperative period.                                               Anselm Pancoast. Zachery Dakins, M.D.    WJW/MEDQ  D:  07/22/2003  T:  07/23/2003  Job:  [id number]

## 2010-09-23 NOTE — Op Note (Signed)
[place]. [facility]  Patient:    [name] [name]                        MRN: [id number] Proc. Date: 12/05/99 Adm. Date:  [id number] Disc. Date: [id number] Attending:  Henrene Dodge CC:         Anselm Pancoast. Zachery Dakins, M.D.  Guy Sandifer Arleta Creek, M.D.  [facility]   Operative Report  PREOPERATIVE DIAGNOSIS:  Carcinoma of the right breast with positive nodes.  OPERATION:  Right modified radical mastectomy and also placement of a Port-A-Cath.  ANESTHESIA:  General.  SURGEON:  Anselm Pancoast. Zachery Dakins, M.D.  ASSISTANT:  On mastectomy portion was Dr. Adolph Pollack, M.D.  HISTORY:  [name] [name] is a 70 year old Caucasian female, who, approximately three weeks ago, underwent a right lumpectomy and sentinel node biopsy and at the time of surgery, they thought that the sentinel nodes were negative.  On permanent examination, the nodes were positive.  We also had involved positive margins.  She had undergone a previous metastatic work-up that showed a negative bone scan, negative chest x-ray and negative laboratories, but with these findings, we had her seen first by Dr. Benna Dunks, who was considering doing a latissimus dorsi flap.  The patient has had a previous ventral incisional hernia in the lower abdomen and then, she saw a medical oncologist and he was under the impression with her positive lymph node status that really reconstruction should be postponed at this time and to proceed on with a modified radical mastectomy and then chemotherapy immediately afterwards.  I was in agreement with this and she is now for right modified radical mastectomy.  Patient desires that we go ahead and place her Port-A-Cath at the same time.  Her estrogen/progesterone receptors were positive.  The tumor itself appears to be intraductal and lobular mix variation.  DESCRIPTION OF PROCEDURE:  The patient was taken to the operative suite, PAS stockings was  given a g of Kefzol and induction of general anesthesia and endotracheal tube positioned.  The right breast and axilla was prepped along with the left breast and axilla with Betadine surgical scrub and solution and draped in a sterile manner.  I had marked the skin incisions and elected to kind of ______ Roseanne Reno type but slight up into the axilla and then the superior flap was developed.  The chest skin freed on up to the subclavicular area and over the lateral and to the pectoralis major and then the inferior flap was likewise developed.  There was a little serum right at the inferior aspect that weeped and weeped, aspirated this up from the old lumpectomy site and then the open in through the axillary content.  Where the axillary nodes had been removed, was scarring and we dissected very carefully, freeing the basically the operative area from the overlying skin then retracting the pectoralis major medially open into the  ______  and this was sort of freed and then the underlying true axilla opened into that area.  The nodes basically are kind of high in the axilla and I went down and sort of freed the pectoclavicle fascia and then the nodes were going anteriorly to the axillary vein. Whether this was postoperative changes or whether this was just where her nodes are I am not sure, but after these were sort of peeled away inferior we were then on the axillary vein and numerous little tributaries were clipped distally and  then ligated with 3-0 Vicryl.  The long thoracic nerve and the thoracodorsal nerves were both identified and saved.  The cutaneous nerves going through the axilla were doubly clipped proximally, singly distally and sacrificed and the latissimus dorsi was identified laterally.  I expect quite a few axillary lymph nodes that will be positive, but they were all removed en bloc and the brachial plexus and of course the axillary vein were all protected.  Wound good hemostasis  was obtained.  Most of the vessels had been cauterized on the pectoralis major muscle, a few had been sutured with 3-0 Vicryl and then two 19 Blake drains round were placed, one over the pectoralis major, one up into the axilla brought out through stab wounds in the lower aspect of the wound.  I closed the subcuticular portion of the wound with interrupted 3-0 Vicryl and then the skin was closed with staples.  The superior skin flap was shorter than the posterior and I used a little T kind of in the axilla to compensate for the skin discrepancies.  The surgery completed, the staples had been placed, etc.  I then basically changed my glove and gowns, put on an apron and then we opened the left side that had been prepped, but the sterile drape had been placed over it and cut this and put towels around circumferentially and then placed a Duval detachable Port-A-Cath in the left subclavian vein.  I had entered the vein with her in the Trendelenburg position on about the second attempt with the guidewire was slipped into place in the superior vena cava easily and then patient taken partially out of Trendelenburg and the pocket for the Port-A-Cath created.  A few little vessels were coagulated and then the heparinized filled Duval Port-A-Cath was placed in the pocket, but not sutured.  The sutures were placed, but not tied and then the #10 [facility] dilator was used introduced over the guidewire, sheath left in place, the silastic catheter threaded into the junction of the superior vena cava and right atrium and then tunneled subcutaneously and hooked up to the catheter.  Good C-arm position with the fluoroscopy and then the sutures to the pectoral fascia were tied and two ______ sutures of 2-0 Prolene were placed on the lateral aspect, repositioned, reinspected the catheter and slight flat, gentle curve in the silastic tubing was at the appropriate positioning in the superior vena cava  and then the  subcutaneous wounds were first closed with 3-0 Vicryl and then 4-0 Monocryl subcuticular and then benzoin and Steri-Strips on the actual skin.  The second dressing was placed over this.  I did fill the Port-A-Cath with 5 cc of the heparinized saline 100 units/cc and aspirates good blood return and filled the reservoir and we will not use this Port-A-Cath during this hospitalization.  Patient tolerated the procedure nicely and was extubated and taken to the recovery room in stable condition after sterile occlusive dressings had been applied.  Get a chest x-ray in recovery room and hopefully will have no pneumo.  Patients estimated blood loss is probably 150 cc or less and the sponge and needle counts were correct x 2 at completion of surgery. DD:  12/05/98 TD:  12/06/99 Job: [id number]

## 2010-09-23 NOTE — Assessment & Plan Note (Signed)
[facility]                         GASTROENTEROLOGY OFFICE NOTE   NAME:[name] [name], [name] [name]                       MRN:          [id number]  DATE:08/09/2006                            DOB:          [birth year]    [name] is due for a followup colonoscopy for a previous colon  carcinoma, stage 2 in March of 2005 requiring resection with adjuvant 5-  FU leucovorin for 4 cycles.  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  She has chronic recurrent bacterial  overgrowth syndrome, is having diarrhea at this time.  She has had good  response in the past to oral nonabsorbable antibiotics.  She denies  abdominal pain, anorexia, weight loss, melena, or hematochezia.  She is  followed clinically by Dr. Alwyn Ren, and from an oncology standpoint by  Dr. Welton Flakes.  She, apparently, has been doing well, is in remission at this  time.   Her vital signs were all normal as was the abdominal exam.   INCOMPLETE     Vania Rea. Jarold Motto, MD, Caleen Essex, FAGA  Electronically Signed    DRP/MedQ  DD: 08/09/2006  DT: 08/09/2006  Job #: [id number]   cc:   Drue Second, MD  Dr. Alwyn Ren

## 2010-09-23 NOTE — Op Note (Signed)
NAME:  [name] [name], SOHAIL                          ACCOUNT NO.:  [id number]   MEDICAL RECORD NO.:  [id number]                   PATIENT TYPE:  AMB   LOCATION:  DSC                                  FACILITY:  [facility]   PHYSICIAN:  Anselm Pancoast. Zachery Dakins, M.D.          DATE OF BIRTH:  [birth year]   DATE OF PROCEDURE:  09/15/2003  DATE OF DISCHARGE:                                 OPERATIVE REPORT   PREOPERATIVE DIAGNOSES:  1. Recent carcinoma of the right colon.  2. History of previous cancer of the breast.   OPERATION:  Placement of Port-A-Cath for chemotherapy.   ANESTHESIA:  Local anesthesia with sedation.   SURGEON:  Anselm Pancoast. Zachery Dakins, M.D.   HISTORY:  [name] is a 70 year old female who has had cancer of the  breast with extensive nodes approximately, I think, five or six years ago.  Four years ago, underwent chemotherapy after a right modified radical  mastectomy, did well.  Recently, was noted to have guaiac-positive stools  and a cancer of the right transverse colon.  An extended right colectomy was  performed.  She has had a previous sigmoid colectomy and has had  intermittent diarrhea.  She has had and extensive workup by the medical  oncologists, and they have recommended kind of chemotherapy in spite of  having all nodes negative.  The patient is for replacement of her Port-A-  Cath.  She has had one previously in the right subclavian that was removed  approximately a year ago, and we will try to get one back in the same  location.  She has kind of mild lymphedema of the right upper extremity  following her treatment for the right breast cancer.   The patient was positioned on the OR table.  She had received a gram of  Kefzol and then the left chest was prepped with Betadine solution and draped  in a sterile manner.  I put a hemostat where I think the superior vena cava-  atrial junction is and then anesthetized the subclavian area and,  fortunately, on the first stick  the needle did go into the subclavian, the  guidewire was slipped in place, and it slipped in place easily.  Next she  was taken out of the extreme Trendelenburg and the old incision site was  anesthetized with 1% plain Xylocaine and a small incision was made.  The  underlying pocket was created, and then I placed her back in a mild  Trendelenburg position and using the 10 Cook introducer, slipped over the  guidewire.  It slipped in place without significant resistance.  The wire  and introducer were removed.  The catheter was then slipped through the  little sheath.  It appeared to kind of hang up in the sheath, and it was  necessary to slip a wire in it to go a little past resistance, and then the  peel-away was removed with the catheter  in the atrium.  I then took her out  of the Trendelenburg and, hopefully, got the catheter right at the most  distal superior vena cava-atrial junction and then tunneled it into the  reservoir, cut off the catheter, hooked it up, and put the little locking  device in place.  The previously-placed 2-0 Prolenes and a third were tied  so that the Port-A-Cath was lying flat on the pectoral fascia and then the  subcutaneous tissue was closed around it with 3-0 chromic, then 4-0 Vicryl  was used for the subcuticular and then Benzoin and Steri-Strips on the skin.  The patient tolerated the procedure nicely and the catheter was lying flat  so that the little loop is not protruding up.  The Steri-  Strips and Benzoin were placed.  I then accessed the Port-A-Cath with a  Huber needle with the little locking device in place and injected dilute  saline in about 3 mL of 100 units/mL of heparin so that they can use it in  the a.m.  A sterile occlusive dressing was then applied, and we will get a  chest x-ray in the recovery room.                                               Anselm Pancoast. Zachery Dakins, M.D.    WJW/MEDQ  D:  09/15/2003  T:  09/15/2003  Job:  [id number]    cc:   Dr. Luciano Cutter

## 2010-09-23 NOTE — Op Note (Signed)
NAME:  [name] [name], [name] [name]                          ACCOUNT NO.:  [id number]   MEDICAL RECORD NO.:  [id number]                   PATIENT TYPE:  INP   LOCATION:  0005                                 FACILITY:  [facility]   PHYSICIAN:  Anselm Pancoast. Zachery Dakins, M.D.          DATE OF BIRTH:  1940/09/03   DATE OF PROCEDURE:  07/20/2003  DATE OF DISCHARGE:                                 OPERATIVE REPORT   PREOPERATIVE DIAGNOSES:  Carcinoma of the hepatic flexure.   POSTOPERATIVE DIAGNOSES:  Carcinoma of the  transverse colon.   OPERATION:  Extended right colectomy.   SURGEON:  Anselm Pancoast. Zachery Dakins, M.D.   ASSISTANT:  Sharlet Salina T. Hoxworth, M.D.   ANESTHESIA:  General.   HISTORY:  [name] [name] is a 70 year old Caucasian female who I have known  for many years having dong a sigmoid colectomy for recurring diverticulitis  probably 12-15 years.  She developed the incisional hernia 2 or 3 years  later and then this was repaired with mesh. 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  Dr. Jarold Motto  has been doing her colonoscopies and she was referred back to me for  surgical management.  Her abdominal incision appears to be well healed. The  mesh was kind of at the umbilicus and above and was slightly below and I was  hopeful that we would be able to do a right colectomy without too much small  bowel dissection.  The patient has PAS stockings and been given 3 g of  Unasyn and Foley catheter had been inserted sterilely.  Induction of general  anesthesia, an NG tube was placed into the stomach through the nose and then  the abdomen was prepped with Betadine surgical scrub and solution and draped  in a sterile manner. I made a little  small incision up above the umbilicus  and really right at the top area of the mesh. Sharp dissection carefully  into the peritoneal cavity and the omentum was adherent to the undersurface  of this.  We extended the incision a little bit inferior, identified the  hepatic flexure. The liver is a little fatty liver but no evidence of any  metastatic disease. I tried to mobilize the hepatic flexure but the small  bowel was definitely adherent inferiorly and it was necessary to kind of  extend the incision on down to the umbilicus dividing through approximately  4 inches of the mesh.  With this, we then could get to the terminal ileum  and could kind of free the cecum area medially and then what I thought I was  feeling the tumor kind of more in the cecum was  not really the tumor but it  was really more the transverse colon which is really down adherent under the  umbilicus a little lower and definitely a little more to the left side than  we had originally thought.  We took down the transverse colon completely and  then fortunately it is still a pretty large splenic flexure and left colon  left and the blood supply to it was such that with removing the transverse  colon to pass the midline, I was still able to move good pedicle going over  to the left so that at no time was there any questionable viability of the  left colon. The small bowel, we took probably about 3 inches of terminal  ileum and divided the mesentery between [facility] and then elected to do a GIA  stapled functional end to end anastomosis.  We opened the small bowel and  the colon, used a GIA with the 60 stapler, a 55 and then after making sure  that the suture line was no evidence of any bleeding, closed the end of it  with a TA-60 having Allis's on each end of the staple line and then removed  the specimen from the field. The mesenteric defect was closed with three  figure-of-eight sutures of 3-0 silk and the small bowel was  lined with no  excessive tension. There was still a little bit of left omentum that could  be brought over the suture line and we inspected all areas, no evidence of  any active bleeding, irrigated the inferior and superior pelvis and  aspirated this a correct sponge count.  I then closed the fascia with figure-  of-eight sutures of #1 novofil since the mesh had been split and hopefully  the his will not develop a repeat incisional hernia. The wound was irrigated  and the skin closed with staples.  The patient tolerated the procedure  nicely and was extubated and sent to recovery room in stable postop  condition. Will use PCA morphine for postop control. She will be on insulin  sliding after the Glucometer for the first 24 hours and plan on keeping the  NG tube until we start having some intestinal function.                                               Anselm Pancoast. Zachery Dakins, M.D.    WJW/MEDQ  D:  07/20/2003  T:  07/20/2003  Job:  [id number]   cc:   Vania Rea. Jarold Motto, M.D. [facility]

## 2010-09-23 NOTE — Op Note (Signed)
NAME:  [name] [name], [name] [name]                ACCOUNT NO.:  [id number]   MEDICAL RECORD NO.:  [id number]          PATIENT TYPE:  AMB   LOCATION:  DSC                          FACILITY:  [facility]   PHYSICIAN:  Anselm Pancoast. Weatherly, M.D.DATE OF BIRTH:  08/22/1940   DATE OF PROCEDURE:  01/17/2005  DATE OF DISCHARGE:                                 OPERATIVE REPORT   PREOPERATIVE DIAGNOSES:  1.  Port-A-Cath nonuse, status post treatment of chemotherapy for colon      cancer and past history of breast cancer.  2.  Small sebaceous cyst below the left breast.   POSTOPERATIVE DIAGNOSIS:  1.  Port-A-Cath nonuse, status post treatment of chemotherapy for colon      cancer and past history of breast cancer.  2.  Small sebaceous cyst below the left breast.   OPERATION:  Removal of Port-A-Cath and excision of small sebaceous cyst,  left breast.   SURGEON:  Anselm Pancoast. Zachery Dakins, M.D.   ANESTHESIA:  Local.   HISTORY:  [name] is a 70 year old female who is approximately five  years status post right breast cancer with chemotherapy afterwards with a  Port-A-Cath and then approximately a year and a half ago she developed a  cancer of the colon and had no nodes but they treated her with chemotherapy  and a second Port-A-Cath had been placed.  She has completed all treatments  now and is doing well, and desires that the Port-A-Cath be removed.  She has  a small cyst below the left breast which her bra irritates and it was  recommended we excise that simultaneously.   DESCRIPTION OF PROCEDURE:  The patient's areas were prepped with Betadine  solution and then anesthetized with 1% Xylocaine with adrenaline under the  Port-A-Cath and at the end I anesthetized the little sebaceous cyst.  We  went through the old incision, identifying the Port-A-Cath and the little  Prolene sutures attaching it to the pectoral fascia were divided, freeing  the Port-A-Cath and then a figure-of-eight 3-0 chromic was placed  around the  little sheath, leading to the subclavian vein.  The Port-A-Cath was removed  intact and the little figure-of-eight chromic suture wound was ligated.  I  then closed the deeper area with interrupted 3-0 chromic and then closed the  skin with 5-0 simple, interrupted sutures.  Next, the little sebaceous cyst  that was about 1 cm in size was anesthetized with 4 cc of the solution.  This was ellipsed out intact and then the skin closed with three simple  sutures of 5-0 nylon.  The patient tolerated the procedure nicely and will  be released after a short stay.   She can get the area wet in about 48 hours, keep a little antibiotic  ointment on the areas, and I will see her in the office in about 10 days for  removal of the sutures.           ______________________________  Anselm Pancoast. Zachery Dakins, M.D.    WJW/MEDQ  D:  01/17/2005  T:  01/17/2005  Job:  [id number]

## 2010-09-23 NOTE — Op Note (Signed)
[facility]  Patient:    [name] [name], [name] [name]                       MRN: [id number] Proc. Date: 01/12/00 Adm. Date:  [id number] Disc. Date: [id number] Attending:  Laqueta Jean CC:         Dr. Lorin Picket, [facility]   Operative Report  PREOPERATIVE DIAGNOSES:  Left bladder wall bladder tumor.  POSTOPERATIVE DIAGNOSES:  Left bladder wall bladder tumor.  OPERATION PERFORMED:  Cystourethroscopy and transurethral resection of left bladder wall bladder tumor, with cold cut bladder biopsies.  SURGEON:  Dr. Patsi Sears.  ANESTHESIA:  General.  PREPARATION:  After appropriate preanesthesia, the patient was brought to the operating room and placed on the operating table in dorsal supine position where general anesthesia (LMA) was introduced. She was then replaced in the low Allen stirrup dorsal lithotomy position where the pubis was prepped with Betadine solution and draped in the usual fashion.  DESCRIPTION OF PROCEDURE:  Cystourethroscopy again revealed a large left bladder wall bladder tumor measuring approximately 4 cm to 5 cm across its diameter. It appeared to be proximal and lateral to the left ureteral orifice. Cold cut bladder biopsy was taken of the tumor and cold cut bladder biopsy was also taken of the right bladder base. This was cauterized. Transurethral resection of the bladder tumor was then accomplished, and no bleeding was noted. The patient tolerated the procedure well and after chips were evacuated from the bladder, a 16 Foley catheter was placed in the bladder. The patient was given antiemetic prior to awakening, and was then awakened and taken to the recovery room in good condition. DD:  01/12/00 TD:  01/13/00 Job: [id number]

## 2010-10-24 ENCOUNTER — Other Ambulatory Visit: Payer: Self-pay

## 2010-10-24 MED ORDER — OMEPRAZOLE 20 MG PO CPDR: 20.0000 mg | DELAYED_RELEASE_CAPSULE | Freq: Every day | ORAL | Status: DC

## 2011-01-18 ENCOUNTER — Other Ambulatory Visit: Payer: Self-pay | Admitting: Internal Medicine

## 2011-02-20 ENCOUNTER — Other Ambulatory Visit: Payer: Medicare Other | Admitting: *Deleted

## 2011-02-27 ENCOUNTER — Ambulatory Visit: Payer: Medicare Other

## 2011-03-07 ENCOUNTER — Ambulatory Visit (INDEPENDENT_AMBULATORY_CARE_PROVIDER_SITE_OTHER): Payer: Medicare Other | Admitting: *Deleted

## 2011-03-07 DIAGNOSIS — E782 Mixed hyperlipidemia: Secondary | ICD-10-CM

## 2011-03-07 DIAGNOSIS — E7800 Pure hypercholesterolemia, unspecified: Secondary | ICD-10-CM

## 2011-03-07 LAB — HEPATIC FUNCTION PANEL
Albumin: 3.8 g/dL (ref 3.5–5.2)
Alkaline Phosphatase: 123 U/L — ABNORMAL HIGH (ref 39–117)
Bilirubin, Direct: 0 mg/dL (ref 0.0–0.3)

## 2011-03-07 LAB — LIPID PANEL
HDL: 47.7 mg/dL (ref >39.00)
Triglycerides: 124 mg/dL (ref 0.0–149.0)
VLDL: 24.8 mg/dL (ref 0.0–40.0)

## 2011-03-13 ENCOUNTER — Other Ambulatory Visit: Payer: Medicare Other

## 2011-03-14 ENCOUNTER — Encounter: Payer: Self-pay | Admitting: Internal Medicine

## 2011-03-15 ENCOUNTER — Ambulatory Visit (INDEPENDENT_AMBULATORY_CARE_PROVIDER_SITE_OTHER): Payer: Medicare Other | Admitting: Internal Medicine

## 2011-03-15 ENCOUNTER — Encounter: Payer: Self-pay | Admitting: Internal Medicine

## 2011-03-15 VITALS — BP 124/80 | HR 90 | Temp 99.0°F | Wt 184.0 lb

## 2011-03-15 DIAGNOSIS — R197 Diarrhea, unspecified: Secondary | ICD-10-CM

## 2011-03-15 DIAGNOSIS — Z85038 Personal history of other malignant neoplasm of large intestine: Secondary | ICD-10-CM

## 2011-03-15 DIAGNOSIS — R160 Hepatomegaly, not elsewhere classified: Secondary | ICD-10-CM

## 2011-03-15 MED ORDER — DIPHENOXYLATE-ATROPINE 2.5-0.025 MG PO TABS: 1.0000 | ORAL_TABLET | Freq: Two times a day (BID) | ORAL | Status: DC | PRN

## 2011-03-15 NOTE — Progress Notes (Signed)
  Subjective:    Patient ID: [name] [name], female    DOB: 27-May-1940, 70 y.o.   MRN: [id number]  HPI She has had frank diarrhea since her second colon surgery for malignancy in 2005. Her surgeon, Dr. Zachery Dakins had prescribed Lomotil 2.5 mg up to 8 a day as needed. She has never taken more than 2 daily to control the diarrhea which is manifested as frank "hot" water.  She had a sigmoid colectomy apparently in 1987 for diverticulitis. The diarrhea did not start until the second colon surgery.    Review of Systems Symptoms Nausea/Vomiting: no  Constipation: no Melena/BRBPR: no  Hematemesis: no  Anorexia: no  Fever/Chills: no  Wt loss: no  NSAIDs/ASA: no   Her  Gynecologist, Dr. Huntley Dec, stated that her liver was slightly enlarged and recommended she discuss this with me.  She denies clay-colored stool or cola-colored urine. She denies jaundice. She does have pruritis of the skin. Review of the chart reveals chronic mild elevation of hepatic enzymes which is essentially stable        Objective:   Physical Exam General appearance: good health and nourishment w/o distress.  Eyes: No conjunctival inflammation or scleral icterus is present.  Oral exam: Dental hygiene is good; lips and gums are healthy appearing.There is no oropharyngeal erythema or exudate noted.   Heart:  Normal rate and regular rhythm. S1 and S2 normal without gallop,  click, rub or other extra sounds. A grade 1 systolic murmur is present at the base  .   Lungs:Chest clear to auscultation; no wheezes, rhonchi,rales ,or rubs present.No increased work of breathing.   Abdomen: bowel sounds normal, soft and non-tender without masses, or hernias noted.  No guarding or rebound . The left lobe of the liver is palpable 4 fingerbreadths below the inferior costal margin  Skin:Warm & dry.  Intact without suspicious lesions or rashes ; no jaundice or tenting  Lymphatic: No lymphadenopathy is noted about the head, neck, axilla  areas.              Assessment & Plan:  #1 frank diarrhea related to  colon surgery x2  #2 clinical hepatomegaly  Plan: See orders and recommendations

## 2011-03-15 NOTE — Patient Instructions (Signed)
.  Share results with all MDs seen  

## 2011-03-16 LAB — TSH: TSH: 1.81 u[IU]/mL (ref 0.35–5.50)

## 2011-03-20 ENCOUNTER — Ambulatory Visit (INDEPENDENT_AMBULATORY_CARE_PROVIDER_SITE_OTHER): Payer: Medicare Other | Admitting: Pharmacist

## 2011-03-20 VITALS — BP 124/80 | HR 80 | Ht 64.5 in | Wt 189.0 lb

## 2011-03-20 DIAGNOSIS — E7800 Pure hypercholesterolemia, unspecified: Secondary | ICD-10-CM

## 2011-03-20 NOTE — Progress Notes (Signed)
HPI:  87 yoF presents to clinic for follow-up on hyperlipidemia.  PMHx significant for diabetes, diverticulitis, and colon cancer s/p colon resection.  Pt currently taking Fish oil 1 gram daily and Crestor 5mg  daily.  Following recommendations from last HLD visit, pt attempted to increase to Crestor 10mg  two days a week and Crestor 5mg  all other days, but experienced significant muscle pain and weakness with increase in Crestor dose and reduced dose back to Crestor 5mg  daily.  Pt states her diet is not good but she is limited in foods she can tolerate due to colon resection surgeries.  Pt states she does not exercise very often and has gained 10lb in weight since last visit, which she partially attributes to increase in insulin requirements.    Current Outpatient Prescriptions on File Prior to Visit  Medication Sig Dispense Refill  . aspirin 81 MG tablet Take 81 mg by mouth daily.        . bisoprolol (ZEBETA) 5 MG tablet Take 5 mg by mouth daily.        . diphenoxylate-atropine (LOMOTIL) 2.5-0.025 MG per tablet Take 1 tablet by mouth 2 (two) times daily as needed.  180 tablet  0  . fish oil-omega-3 fatty acids 1000 MG capsule Take 1 g by mouth daily.       Marland Kitchen glucose blood test strip 1 each by Other route as needed. Use as instructed       . hydrocortisone-pramoxine (ANALPRAM-HC) 2.5-1 % rectal cream Place 1 application rectally as needed.       . insulin glargine (LANTUS) 100 UNIT/ML injection Inject into the skin at bedtime. 50 units in the am, 65 units evening      . insulin lispro (HUMALOG) 100 UNIT/ML injection Inject 6 Units into the skin 3 (three) times daily before meals.        . meclizine (ANTIVERT) 25 MG tablet Take 25 mg by mouth 3 (three) times daily as needed.        Marland Kitchen omeprazole (PRILOSEC) 20 MG capsule TAKE 1 CAPSULE DAILY (APPOINTMENT DUE)  90 capsule  0  . PARoxetine (PAXIL) 20 MG tablet Take 20 mg by mouth every morning.        . rosuvastatin (CRESTOR) 10 MG tablet Take 5 mg by mouth  daily. Take 1/2 tablet every day except take 1 tablet on Monday and Friday.      . sitaGLIPtan-metformin (JANUMET) 50-1000 MG per tablet Take 1 tablet by mouth 2 (two) times daily with a meal.         No Known Allergies

## 2011-03-20 NOTE — Patient Instructions (Signed)
Please call your primary care physician, Dr. Alwyn Ren, to see if samples of Zetia 10mg  are available.    It was very nice to meet you.  Please try to walk at least 2x weekly and continue trying to eat healthy foods as tolerated with the colon.  Your cholesterol numbers look great!  We just want to try and reduce your LDL from 100 to < 70.    Have a great Thanksgiving!

## 2011-03-21 ENCOUNTER — Ambulatory Visit
Admission: RE | Admit: 2011-03-21 | Discharge: 2011-03-21 | Disposition: A | Discharge status: Home / Self Care | Payer: Medicare Other | Source: Ambulatory Visit | Attending: Internal Medicine | Admitting: Internal Medicine

## 2011-03-21 DIAGNOSIS — R160 Hepatomegaly, not elsewhere classified: Secondary | ICD-10-CM

## 2011-03-21 NOTE — Assessment & Plan Note (Signed)
A: Hyperlipidemia: Elevated LDL: 100 mg/dL (goal <16 mg/dL).  Total cholesterol, triglycerides, and HDL stable and controlled: TC: 172 mg/dL (goal <109), TGs: 604 mg/dL (goal <540), HDL: 98.1 mg/dL (goal <19).   Contributing factors to hyperlipidemia include poor diet, inconsistent exercise,10lb weight gain since April, 2012, and intolerance to increases in Crestor doses.  Zetia is an option to further reduce LDL 15-20%.  Colon resection should not inhibit mechanism of action as Zetia inhibits cholesterol absorption at brush border of small intestines.  Pt has concerns about cost of medications and there are not any samples are available in clinic.    P: Continue Crestor 5mg  daily.   Pt encouraged to obtain samples of Zetia 10mg  daily from her PCP, Dr. Alwyn Ren, and to call her insurance company to find out her estimated copay.  If pt able to tolerate and afford Zetia, a prescription can be called in to her pharmacy.  Pt counseled to increase protein and non-starchy carbohydrates as tolerated in her diet, and to increase exercise with goal of walking at least 2-3x weekly.   [name] [name]  03/21/2011

## 2011-03-22 ENCOUNTER — Telehealth: Payer: Self-pay | Admitting: Internal Medicine

## 2011-03-22 NOTE — Telephone Encounter (Signed)
6 weeks if available

## 2011-03-22 NOTE — Telephone Encounter (Signed)
Please advise I do not see this med on Pt med list, ok to give samples.

## 2011-03-22 NOTE — Telephone Encounter (Signed)
Pt aware samples placed up front for pick up . 

## 2011-03-27 ENCOUNTER — Other Ambulatory Visit: Payer: Self-pay | Admitting: Internal Medicine

## 2011-03-27 ENCOUNTER — Telehealth: Payer: Self-pay | Admitting: Internal Medicine

## 2011-03-27 DIAGNOSIS — R197 Diarrhea, unspecified: Secondary | ICD-10-CM

## 2011-03-27 NOTE — Telephone Encounter (Signed)
Patient wants 30 day supply  Lomotil called in to local pharmacy [facility] [address]

## 2011-03-27 NOTE — Telephone Encounter (Signed)
Left message on voicemail for patient to return call when available, reason for call-inform patient Dr.Hopper ok with filling, ? Pill or liquid form

## 2011-03-27 NOTE — Telephone Encounter (Signed)
See notes; GI has had her on this.

## 2011-03-27 NOTE — Telephone Encounter (Signed)
Dr.Hopper please advise, med on patient list in EPIC and [facility] but never filled

## 2011-03-27 NOTE — Telephone Encounter (Signed)
OK X 3 mos 

## 2011-03-27 NOTE — Telephone Encounter (Signed)
Spoke with patient, patient see's GI once every 3 years. Patient states Dr.Weatherly her surgeon originally prescribed this, patient states she was told her primary care Dr or GI could fill.  Patient states she spoke with Dr.Hopper about this.

## 2011-03-28 ENCOUNTER — Telehealth: Payer: Self-pay

## 2011-03-28 DIAGNOSIS — Z01818 Encounter for other preprocedural examination: Secondary | ICD-10-CM

## 2011-03-28 DIAGNOSIS — N281 Cyst of kidney, acquired: Secondary | ICD-10-CM

## 2011-03-28 MED ORDER — DIPHENOXYLATE-ATROPINE 2.5-0.025 MG PO TABS: 1.0000 | ORAL_TABLET | Freq: Two times a day (BID) | ORAL | Status: DC | PRN

## 2011-03-28 NOTE — Telephone Encounter (Signed)
Message copied by Edgardo Roys on Tue Mar 28, 2011  5:28 PM ------      Message from: Pecola Lawless      Created: Thu Mar 23, 2011  6:22 PM       The kidney cyst as an incidental finding ;please consider the MRA imaging study as recommended.       .Hepatic steatosis or  "fatty liver" is almost always due to excess intake of sugar from High Fructose Corn Syrup added to foods & drinks.HFCS is converted into Triglycerides (TG) which is essentially liquid fat by the liver. After all intra-abdominal binding sites are saturated ,  TG then enter the liver . TG infiltrate around the liver cells , compressing them & causing release of AST & ALT, liver enzymes.   You should consume as little HFCS sugar as possible, but @ least < 30 grams per day from foods & drinks with HFCS as # 1,2,3, or #4 on the label.  .Share results with all MDs seen. [employer]

## 2011-03-28 NOTE — Telephone Encounter (Signed)
Spoke with patient, patient taking pill form. RX sent

## 2011-03-28 NOTE — Telephone Encounter (Signed)
Left message on voicemail for patient to return call to discuss if she would like to consider MRA

## 2011-04-03 NOTE — Telephone Encounter (Signed)
Spoke with patient, patient agreed to setting up MRA

## 2011-04-04 NOTE — Telephone Encounter (Signed)
The order that needs to be entered for this patient is an MRI ABDOMEN W - W/O, INCLUDE "MRCP".  Please enter order per Chrae/Dr. Alwyn Ren.    **THIS PATIENT DOES NOT HAVE A RECENT BUN & CREATININE, AND CAN NOT HAVE THE MRI UNTIL LAB ORDERS ARE PUT IN, PATIENT IS CALLED TO HAVE LABS, AND RESULTS ARE RECEIVED**

## 2011-04-06 ENCOUNTER — Other Ambulatory Visit: Payer: Self-pay | Admitting: Internal Medicine

## 2011-04-07 ENCOUNTER — Other Ambulatory Visit (INDEPENDENT_AMBULATORY_CARE_PROVIDER_SITE_OTHER): Payer: Medicare Other

## 2011-04-07 ENCOUNTER — Other Ambulatory Visit: Payer: Self-pay | Admitting: Internal Medicine

## 2011-04-07 DIAGNOSIS — N281 Cyst of kidney, acquired: Secondary | ICD-10-CM

## 2011-04-07 DIAGNOSIS — Z01818 Encounter for other preprocedural examination: Secondary | ICD-10-CM

## 2011-04-07 NOTE — Progress Notes (Signed)
12  

## 2011-04-07 NOTE — Telephone Encounter (Signed)
Patient followed at the Lipid clinic.     KP

## 2011-04-07 NOTE — Telephone Encounter (Signed)
Discussed with patient--- orders put in... She will come in at 1 pm to have labs drawn    KP

## 2011-04-10 ENCOUNTER — Other Ambulatory Visit: Payer: Medicare Other

## 2011-04-10 LAB — CREATININE, SERUM: Creatinine, Ser: 0.7 mg/dL (ref 0.4–1.2)

## 2011-04-13 ENCOUNTER — Ambulatory Visit (HOSPITAL_COMMUNITY)
Admission: RE | Admit: 2011-04-13 | Discharge: 2011-04-13 | Disposition: A | Discharge status: Home / Self Care | Payer: Medicare Other | Source: Ambulatory Visit | Attending: Internal Medicine | Admitting: Internal Medicine

## 2011-04-13 DIAGNOSIS — R9389 Abnormal findings on diagnostic imaging of other specified body structures: Secondary | ICD-10-CM | POA: Insufficient documentation

## 2011-04-13 DIAGNOSIS — N281 Cyst of kidney, acquired: Secondary | ICD-10-CM

## 2011-04-13 DIAGNOSIS — Z853 Personal history of malignant neoplasm of breast: Secondary | ICD-10-CM | POA: Insufficient documentation

## 2011-04-13 MED ORDER — GADOBENATE DIMEGLUMINE 529 MG/ML IV SOLN
20.0000 mL | Freq: Once | INTRAVENOUS | Status: AC | PRN
  Administered 2011-04-13: 18 mL via INTRAVENOUS

## 2011-04-25 ENCOUNTER — Telehealth: Payer: Self-pay

## 2011-04-25 NOTE — Telephone Encounter (Signed)
Left message on voicemail for patient to return call, reason for call was to inform patient of radiology results (copy of report mailed)

## 2011-04-25 NOTE — Telephone Encounter (Signed)
Message copied by Maurice Small on Tue Apr 25, 2011  9:37 AM ------      Message from: Pecola Lawless      Created: Sun Apr 23, 2011  9:03 AM       FANTASTIC report; the possible lesion apparently was an artifact.This is usually caused by two normal structures overlapping each other, suggesting a mass. Congratulations . Please consider repeat US in 6 months to be cautious.      [employer]

## 2011-04-26 NOTE — Telephone Encounter (Signed)
Spoke with patient, patient ok'd all information and aware copy of report to be mailed

## 2011-07-17 ENCOUNTER — Telehealth: Payer: Self-pay | Admitting: Oncology

## 2011-07-17 ENCOUNTER — Encounter: Payer: Self-pay | Admitting: *Deleted

## 2011-07-17 NOTE — Telephone Encounter (Signed)
S/w the pt regarding her r/s appts from 07/20/2011 to 07/28/2011@3 :30pm due to the md's schedule.

## 2011-07-18 ENCOUNTER — Telehealth: Payer: Self-pay | Admitting: *Deleted

## 2011-07-18 NOTE — Telephone Encounter (Signed)
patient called and confirmed over the phone  

## 2011-07-19 DIAGNOSIS — E7800 Pure hypercholesterolemia, unspecified: Secondary | ICD-10-CM | POA: Diagnosis not present

## 2011-07-19 DIAGNOSIS — I10 Essential (primary) hypertension: Secondary | ICD-10-CM | POA: Diagnosis not present

## 2011-07-20 ENCOUNTER — Other Ambulatory Visit: Payer: Medicare Other | Admitting: Lab

## 2011-07-20 ENCOUNTER — Ambulatory Visit: Payer: Medicare Other | Admitting: Oncology

## 2011-07-28 ENCOUNTER — Telehealth: Payer: Self-pay | Admitting: *Deleted

## 2011-07-28 ENCOUNTER — Ambulatory Visit (HOSPITAL_BASED_OUTPATIENT_CLINIC_OR_DEPARTMENT_OTHER): Payer: Medicare Other | Admitting: Oncology

## 2011-07-28 ENCOUNTER — Encounter: Payer: Self-pay | Admitting: Oncology

## 2011-07-28 ENCOUNTER — Other Ambulatory Visit (HOSPITAL_BASED_OUTPATIENT_CLINIC_OR_DEPARTMENT_OTHER): Payer: Medicare Other | Admitting: Lab

## 2011-07-28 VITALS — BP 131/81 | HR 96 | Temp 98.9°F | Ht 64.5 in | Wt 187.8 lb

## 2011-07-28 DIAGNOSIS — Z8551 Personal history of malignant neoplasm of bladder: Secondary | ICD-10-CM

## 2011-07-28 DIAGNOSIS — C679 Malignant neoplasm of bladder, unspecified: Secondary | ICD-10-CM

## 2011-07-28 DIAGNOSIS — C50919 Malignant neoplasm of unspecified site of unspecified female breast: Secondary | ICD-10-CM

## 2011-07-28 DIAGNOSIS — Z85038 Personal history of other malignant neoplasm of large intestine: Secondary | ICD-10-CM | POA: Diagnosis not present

## 2011-07-28 DIAGNOSIS — Z853 Personal history of malignant neoplasm of breast: Secondary | ICD-10-CM

## 2011-07-28 DIAGNOSIS — E559 Vitamin D deficiency, unspecified: Secondary | ICD-10-CM | POA: Diagnosis not present

## 2011-07-28 DIAGNOSIS — Z85828 Personal history of other malignant neoplasm of skin: Secondary | ICD-10-CM

## 2011-07-28 LAB — COMPREHENSIVE METABOLIC PANEL
ALT: 36 U/L — ABNORMAL HIGH (ref 0–35)
AST: 47 U/L — ABNORMAL HIGH (ref 0–37)
Alkaline Phosphatase: 138 U/L — ABNORMAL HIGH (ref 39–117)
Creatinine, Ser: 0.63 mg/dL (ref 0.50–1.10)
Sodium: 137 mEq/L (ref 135–145)
Total Bilirubin: 0.5 mg/dL (ref 0.3–1.2)

## 2011-07-28 LAB — CBC WITH DIFFERENTIAL/PLATELET
BASO%: 1.4 % (ref 0.0–2.0)
EOS%: 1.4 % (ref 0.0–7.0)
HCT: 34.8 % (ref 34.8–46.6)
LYMPH%: 36.8 % (ref 14.0–49.7)
MCH: 29.8 pg (ref 25.1–34.0)
MCHC: 33 g/dL (ref 31.5–36.0)
NEUT%: 51.1 % (ref 38.4–76.8)
Platelets: 207 10*3/uL (ref 145–400)
RBC: 3.84 10*6/uL (ref 3.70–5.45)

## 2011-07-28 NOTE — Telephone Encounter (Signed)
gave patient appointment for 07-2012 printed out calendar and gave to the patient 

## 2011-07-28 NOTE — Patient Instructions (Signed)
1. You are doing well.   2. Continue doing your mammograms and colonoscopies  3. We will see you back in 1 year with blood work

## 2011-08-01 ENCOUNTER — Other Ambulatory Visit: Payer: Self-pay | Admitting: Pharmacist

## 2011-08-01 DIAGNOSIS — E782 Mixed hyperlipidemia: Secondary | ICD-10-CM

## 2011-08-02 ENCOUNTER — Other Ambulatory Visit (INDEPENDENT_AMBULATORY_CARE_PROVIDER_SITE_OTHER): Payer: Self-pay | Admitting: General Surgery

## 2011-08-02 DIAGNOSIS — Z1231 Encounter for screening mammogram for malignant neoplasm of breast: Secondary | ICD-10-CM

## 2011-08-02 DIAGNOSIS — Z9011 Acquired absence of right breast and nipple: Secondary | ICD-10-CM

## 2011-08-03 NOTE — Progress Notes (Signed)
OFFICE PROGRESS NOTE  CC Dr. Sheryn Bison Marga Melnick, MD, MD [id number] [address] [facility] [address] 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  #2 stage II adenocarcinoma of the colon diagnosed March 2005.  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  PRIOR THERAPY:  #1 breast cancer: Patient underwent a mastectomy of the right breast that showed 17 out of 13 lymph nodes positive for metastatic disease. She received 4 cycles of adjuvant Adriamycin and Cytoxan then 3 cycles of Taxol. She subsequently began adjuvant Arimidex from April 2002 to April 2007. Then in 2007 she was switched to Evista 60 mg daily and she completed this in 2013.  #2 colon cancer: Patient was treated with colectomy followed by adjuvant chemotherapy consisting of 5-FU and leucovorin for a duration of 4 cycles.  #3 papillary cell carcinoma of the bladder.  CURRENT THERAPY:Observation for all 3 primaries  INTERVAL HISTORY: [name] [name] 70 y.o. female returns for Followup visit today. Overall she is doing well she seems to be without any significant complaints. She denies any fevers chills night sweats headaches shortness of breath chest pains palpitations she has no myalgias or arthralgias. She still continues to play golf. However she is noted to have a fatty liver for which she is getting some therapy. She will have her mammogram performed in April of this year. All in all patient is doing quite well without any evidence of recurrent disease or  MEDICAL HISTORY: Past Medical History  Diagnosis Date  . Transaminase or LDH elevation   . Hiatal hernia   . GERD (gastroesophageal reflux disease)   . Diverticulitis   . Hypertension   . Rectal bleeding   . Skin cancer   . HX: breast cancer   . Adenocarcinoma, breast   . Bladder cancer   . Dysmetabolic  syndrome X   . Diabetes mellitus   . Hyperlipidemia   . Hyperglycemia   . IBS (irritable bowel syndrome)     ALLERGIES:   has no known allergies.  MEDICATIONS:  Current Outpatient Prescriptions  Medication Sig Dispense Refill  . aspirin 81 MG tablet Take 81 mg by mouth daily.        . bisoprolol (ZEBETA) 5 MG tablet TAKE 1 TABLET DAILY  90 tablet  2  . CRESTOR 10 MG tablet TAKE 1/2 TABLET DAILY  45 each  3  . diphenoxylate-atropine (LOMOTIL) 2.5-0.025 MG per tablet Take 1 tablet by mouth 2 (two) times daily as needed.  180 tablet  0  . fish oil-omega-3 fatty acids 1000 MG capsule Take 1 g by mouth daily.       Marland Kitchen glucose blood test strip 1 each by Other route as needed. Use as instructed       . hydrocortisone-pramoxine (ANALPRAM-HC) 2.5-1 % rectal cream Place 1 application rectally as needed.       . insulin glargine (LANTUS) 100 UNIT/ML injection Inject into the skin at bedtime. 50 units in the am, 65 units evening      . insulin lispro (HUMALOG) 100 UNIT/ML injection Inject 6 Units into the skin 3 (three) times daily before meals.        Marland Kitchen omeprazole (PRILOSEC) 20 MG capsule TAKE 1 CAPSULE DAILY (NEED MD APPOINTMENT)  90 capsule  2  . PARoxetine (PAXIL) 20 MG tablet Take 20 mg by mouth every morning.        Marland Kitchen  sitaGLIPtan-metformin (JANUMET) 50-1000 MG per tablet Take 1 tablet by mouth 2 (two) times daily with a meal.          SURGICAL HISTORY:  Past Surgical History  Procedure Date  . Esophageal dilation   . Sigmoid colectomy 2005   . Incisonal hernia   . Mastectomy and biopsy   . Bladder cancer 2001   . Colon cancer 2005   . Squamous cell ca  r  calf  2008   . Carpal tunnel release     REVIEW OF SYSTEMS:  A comprehensive review of systems was negative.   PHYSICAL EXAMINATION: General appearance: alert, cooperative and appears stated age Neck: no adenopathy, no carotid bruit, no JVD, supple, symmetrical, trachea midline and thyroid not enlarged, symmetric, no  tenderness/mass/nodules Lymph nodes: Cervical, supraclavicular, and axillary nodes normal. Resp: clear to auscultation bilaterally and normal percussion bilaterally Back: symmetric, no curvature. ROM normal. No CVA tenderness. Cardio: regular rate and rhythm, S1, S2 normal, no murmur, click, rub or gallop GI: soft, non-tender; bowel sounds normal; no masses,  no organomegaly Extremities: extremities normal, atraumatic, no cyanosis or edema Neurologic: Grossly normal  ECOG PERFORMANCE STATUS: 1 - Symptomatic but completely ambulatory  Blood pressure 131/81, pulse 96, temperature 98.9 F (37.2 C), temperature source Oral, height 5' 4.5" (1.638 m), weight 187 lb 12.8 oz (85.186 kg).  LABORATORY DATA: Lab Results  Component Value Date   WBC 7.3 07/28/2011   HGB 11.5* 07/28/2011   HCT 34.8 07/28/2011   MCV 90.5 07/28/2011   PLT 207 07/28/2011      Chemistry      Component Value Date/Time   NA 137 07/28/2011 1540   NA 138 01/18/2006 1426   K 3.9 07/28/2011 1540   K 4.2 01/18/2006 1426   CL 100 07/28/2011 1540   CL 99 01/18/2006 1426   CO2 23 07/28/2011 1540   CO2 29 01/18/2006 1426   BUN 12 07/28/2011 1540   BUN 19 01/18/2006 1426   CREATININE 0.63 07/28/2011 1540   CREATININE 0.9 01/18/2006 1426      Component Value Date/Time   CALCIUM 9.7 07/28/2011 1540   CALCIUM 10.3 01/18/2006 1426   ALKPHOS 138* 07/28/2011 1540   AST 47* 07/28/2011 1540   ALT 36* 07/28/2011 1540   BILITOT 0.5 07/28/2011 1540       RADIOGRAPHIC STUDIES:  No results found.  ASSESSMENT: 71 year old female with  #1 stage III breast cancer colon cancer and papillary bladder cancer without evidence of recurrent disease.  #2 fatty liver.  #3 multiple other medical problems she is seen by Dr. Clearance Coots.   PLAN:   From oncology perspective patient is doing quite well she is without any evidence of recurrent disease. She is encouraged to continue getting her colonoscopies as well as mammograms and physical exercise and  a healthy diet. She also knows to call me with any problems questions or concerns. I will continue to see her on a yearly basis.   All questions were answered. The patient knows to call the clinic with any problems, questions or concerns. We can certainly see the patient much sooner if necessary.  I spent 25 minutes counseling the patient face to face. The total time spent in the appointment was 30 minutes.    Drue Second, MD Medical/Oncology [facility] [phone] (beeper) [phone] (Office)  08/03/2011, 9:11 AM

## 2011-08-16 ENCOUNTER — Telehealth: Payer: Self-pay | Admitting: Internal Medicine

## 2011-08-16 DIAGNOSIS — R197 Diarrhea, unspecified: Secondary | ICD-10-CM

## 2011-08-16 MED ORDER — DIPHENOXYLATE-ATROPINE 2.5-0.025 MG PO TABS: 1.0000 | ORAL_TABLET | Freq: Two times a day (BID) | ORAL | Status: DC | PRN

## 2011-08-16 NOTE — Telephone Encounter (Signed)
Refill: Diphen/atrop 2.5mg  tab. Last fill 03-28-11

## 2011-08-25 ENCOUNTER — Other Ambulatory Visit (INDEPENDENT_AMBULATORY_CARE_PROVIDER_SITE_OTHER): Payer: Medicare Other

## 2011-08-25 DIAGNOSIS — E782 Mixed hyperlipidemia: Secondary | ICD-10-CM

## 2011-08-25 LAB — LIPID PANEL: LDL Cholesterol: 90 mg/dL (ref 0–99)

## 2011-08-25 LAB — HEPATIC FUNCTION PANEL
ALT: 38 U/L — ABNORMAL HIGH (ref 0–35)
AST: 53 U/L — ABNORMAL HIGH (ref 0–37)
Alkaline Phosphatase: 108 U/L (ref 39–117)
Bilirubin, Direct: 0.1 mg/dL (ref 0.0–0.3)
Total Bilirubin: 0.6 mg/dL (ref 0.3–1.2)

## 2011-08-28 ENCOUNTER — Ambulatory Visit (INDEPENDENT_AMBULATORY_CARE_PROVIDER_SITE_OTHER): Payer: Medicare Other | Admitting: Pharmacist

## 2011-08-28 VITALS — BP 116/68 | Wt 183.0 lb

## 2011-08-28 DIAGNOSIS — E782 Mixed hyperlipidemia: Secondary | ICD-10-CM

## 2011-08-28 NOTE — Progress Notes (Signed)
Subjective- Mrs. Andreas presented today in good spirits for 86-month follow up of her lipids.  She continues on Crestor 5mg  daily without complaints of side effects and with good adherence.  She also takes fish oil 1000mg  BID, but reported indigestion following her evening dose.  Of note, due to the high cost of her insulin, she was recently switched to the "[employer] brand," which could be the [employer] NPH product.  Review of Diet- She does not appear to have any significant challenges in regards to her diet.  She continues to avoid fried foods and has reduced her intake of red meat.  Review of Exercise- She has not met her last visit goal of walking 3x/week, but does report that, with the better weather, she plans to play more golf.  Also, she has spent more time walking with her great grandson in his stroller.  Lipid Panel     Component Value Date/Time   CHOL 152 08/25/2011 1042   TRIG 102.0 08/25/2011 1042   HDL 41.20 08/25/2011 1042   CHOLHDL 4 08/25/2011 1042   VLDL 20.4 08/25/2011 1042   LDLCALC 90 08/25/2011 1042    Current Outpatient Prescriptions  Medication Sig Dispense Refill  . aspirin 81 MG tablet Take 81 mg by mouth daily.        . bisoprolol (ZEBETA) 5 MG tablet TAKE 1 TABLET DAILY  90 tablet  2  . CRESTOR 10 MG tablet TAKE 1/2 TABLET DAILY  45 each  3  . diphenoxylate-atropine (LOMOTIL) 2.5-0.025 MG per tablet Take 1 tablet by mouth 2 (two) times daily as needed.  180 tablet  0  . fish oil-omega-3 fatty acids 1000 MG capsule Take 1 g by mouth daily.       Marland Kitchen glucose blood test strip 1 each by Other route as needed. Use as instructed       . hydrocortisone-pramoxine (ANALPRAM-HC) 2.5-1 % rectal cream Place 1 application rectally as needed.       . insulin glargine (LANTUS) 100 UNIT/ML injection Inject into the skin at bedtime. 50 units in the am, 65 units evening      . insulin lispro (HUMALOG) 100 UNIT/ML injection Inject 6 Units into the skin 3 (three) times daily before meals.         Marland Kitchen omeprazole (PRILOSEC) 20 MG capsule TAKE 1 CAPSULE DAILY (NEED MD APPOINTMENT)  90 capsule  2  . PARoxetine (PAXIL) 20 MG tablet Take 20 mg by mouth every morning.        . sitaGLIPtan-metformin (JANUMET) 50-1000 MG per tablet Take 1 tablet by mouth 2 (two) times daily with a meal.

## 2011-08-28 NOTE — Patient Instructions (Addendum)
1. Continue taking Crestor 5mg  daily. 2. Try taking your 2 capsules of fish oil together in the morning, & put the bottle in the freezer. 3. Continue to maintain a heart healthy diet full of fresh fruits/vegetables. 4. Stay as physically active as possible. (try to walk for at least 150 minutes per week) 5. Follow-up visit and labwork in 6 months (we will call you for an appointment)

## 2011-08-28 NOTE — Assessment & Plan Note (Addendum)
CV Risk Assessment- Risk Factors: DMII, HTN, age TC goal <200, HDL goal >45, LDL goal <100 (<70 optimal), TG goal <150  All lipid parameters are under improved control, but HDL & LDL remain above their optimal goals.  Recommendations/Plan- 1. Continue taking Crestor 5mg  daily. 2. Try taking your 2 capsules of fish oil together in the morning, & put the bottle in the freezer. 3. Continue to maintain a heart healthy diet full of fresh fruits/vegetables. 4. Stay as physically active as possible. (try to walk for at least 150 minutes per week) 5. Follow-up visit and labwork in 6 months (we will call you for an appointment)

## 2011-09-20 ENCOUNTER — Ambulatory Visit: Payer: Medicare Other

## 2011-10-16 ENCOUNTER — Ambulatory Visit: Payer: Medicare Other

## 2011-10-31 ENCOUNTER — Telehealth: Payer: Self-pay | Admitting: Internal Medicine

## 2011-10-31 NOTE — Telephone Encounter (Signed)
Message copied by Marshell Garfinkel on Tue Oct 31, 2011  9:50 AM ------      Message from: Pecola Lawless      Created: Sun Oct 29, 2011  2:28 PM       Please schedule A1c & urine microalbumin with appt 2-3 days later. Code: 250.00

## 2011-10-31 NOTE — Telephone Encounter (Signed)
This request came from her insurance company;they are not getting information from Dr. Talmage Nap. Results should be mailed or faxed to [place] to include in her chart.

## 2011-10-31 NOTE — Telephone Encounter (Signed)
Pt states Dr. Talmage Nap checks her a1c every 3 or 4 months and she is due to go back next month. She states that if you still need her to come into the office she will, but she wanted to make sure you knew Dr. Talmage Nap is monitoring her diabetes. Please advise.

## 2011-11-01 ENCOUNTER — Other Ambulatory Visit: Payer: Self-pay | Admitting: Internal Medicine

## 2011-11-01 ENCOUNTER — Ambulatory Visit
Admission: RE | Admit: 2011-11-01 | Discharge: 2011-11-01 | Disposition: A | Discharge status: Home / Self Care | Payer: Medicare Other | Source: Ambulatory Visit | Attending: General Surgery | Admitting: General Surgery

## 2011-11-01 DIAGNOSIS — Z1231 Encounter for screening mammogram for malignant neoplasm of breast: Secondary | ICD-10-CM

## 2011-11-01 DIAGNOSIS — Z9011 Acquired absence of right breast and nipple: Secondary | ICD-10-CM

## 2011-11-08 NOTE — Telephone Encounter (Signed)
lmovm

## 2011-11-15 NOTE — Telephone Encounter (Signed)
Discussed w/patient. She is calling Dr. Willeen Cass office to have them fax [place] a copy of her most recent lab results.

## 2011-11-27 ENCOUNTER — Telehealth: Payer: Self-pay | Admitting: Internal Medicine

## 2011-11-27 DIAGNOSIS — R197 Diarrhea, unspecified: Secondary | ICD-10-CM

## 2011-11-27 MED ORDER — DIPHENOXYLATE-ATROPINE 2.5-0.025 MG PO TABS: 1.0000 | ORAL_TABLET | Freq: Two times a day (BID) | ORAL | Status: DC | PRN

## 2011-11-27 NOTE — Telephone Encounter (Signed)
Dr.Hopper please advise 

## 2011-11-27 NOTE — Telephone Encounter (Signed)
#  60 one as needed. I reviewed the chart; she is long overdue for followup with Dr. Jarold Motto, her gastroenterologist I do recommend that she schedule followup with him

## 2011-11-27 NOTE — Telephone Encounter (Signed)
RX sent over via manual fax. Left message on voicemail with Dr.Hopper recommendation to f/u with GI (phone number given)

## 2011-11-27 NOTE — Telephone Encounter (Signed)
Refill: Diphen/Atrop 2.5mg  tab. Last fill 08-18-11

## 2011-12-07 ENCOUNTER — Encounter: Payer: Self-pay | Admitting: *Deleted

## 2011-12-11 ENCOUNTER — Ambulatory Visit (INDEPENDENT_AMBULATORY_CARE_PROVIDER_SITE_OTHER): Payer: Medicare Other | Admitting: Gastroenterology

## 2011-12-11 ENCOUNTER — Encounter: Payer: Self-pay | Admitting: Gastroenterology

## 2011-12-11 VITALS — BP 120/66 | HR 80 | Ht 65.5 in | Wt 185.0 lb

## 2011-12-11 DIAGNOSIS — R197 Diarrhea, unspecified: Secondary | ICD-10-CM

## 2011-12-11 DIAGNOSIS — Z85048 Personal history of other malignant neoplasm of rectum, rectosigmoid junction, and anus: Secondary | ICD-10-CM | POA: Diagnosis not present

## 2011-12-11 DIAGNOSIS — K625 Hemorrhage of anus and rectum: Secondary | ICD-10-CM | POA: Diagnosis not present

## 2011-12-11 DIAGNOSIS — K648 Other hemorrhoids: Secondary | ICD-10-CM

## 2011-12-11 DIAGNOSIS — K7689 Other specified diseases of liver: Secondary | ICD-10-CM

## 2011-12-11 DIAGNOSIS — R160 Hepatomegaly, not elsewhere classified: Secondary | ICD-10-CM

## 2011-12-11 DIAGNOSIS — K219 Gastro-esophageal reflux disease without esophagitis: Secondary | ICD-10-CM

## 2011-12-11 DIAGNOSIS — E119 Type 2 diabetes mellitus without complications: Secondary | ICD-10-CM

## 2011-12-11 DIAGNOSIS — K7581 Nonalcoholic steatohepatitis (NASH): Secondary | ICD-10-CM

## 2011-12-11 MED ORDER — MOVIPREP 100 G PO SOLR: ORAL | Status: DC

## 2011-12-11 MED ORDER — DIPHENOXYLATE-ATROPINE 2.5-0.025 MG PO TABS: 1.0000 | ORAL_TABLET | Freq: Two times a day (BID) | ORAL | Status: DC | PRN

## 2011-12-11 MED ORDER — OMEPRAZOLE 20 MG PO CPDR: 20.0000 mg | DELAYED_RELEASE_CAPSULE | Freq: Two times a day (BID) | ORAL | Status: DC

## 2011-12-11 MED ORDER — HYDROCORTISONE ACETATE 25 MG RE SUPP: 25.0000 mg | Freq: Every day | RECTAL | Status: AC

## 2011-12-11 NOTE — Patient Instructions (Addendum)
You have been scheduled for an endoscopy and colonoscopy with propofol. Please follow the written instructions given to you at your visit today. Please pick up your prep at the pharmacy within the next 1-3 days. If you use inhalers (even only as needed), please bring them with you on the day of your procedure. We have sent the following medications to your pharmacy for you to pick up at your convenience: Prilosec twice daily Lomotil Anusol HC Suppositories CC: Dr Marga Melnick   Of note: per patient request, I have d/c'ed rx sent to [facility] for Lomotil and Prilosec. They have been sent to [facility] instead. Rx for Anusol suppositories and Moviprep will remain at [facility]. Vernia Buff, CMA 12/11/11 @ 2:47 pm

## 2011-12-11 NOTE — Progress Notes (Signed)
History of Present Illness:  This is a very nice 71 year old Caucasian female who has had surgical excision of breast cancer, bladder cancer, and colon cancer. She now presents with asymptomatic rectal bleeding. Last colonoscopy was 3 years ago. She is insulin-dependent diabetic and followed by Dr. Lurene Shadow. Patient has chronic acid reflux and is on Prilosec 20 mg one to 2 times a day. Last endoscopy was over 10 years ago. Her son recently was diagnosed with esophageal cancer related to Barrett's mucosa. The sizer rectal bleeding, the patient is asymptomatic except for chronic diarrhea state related to to previous: Surgical procedures for cancer and diverticulitis. She controls her symptomatology with Lomotil one to 2 times a day. 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 She is followed by Dr.Kahn in oncology, and is not on any chemotherapy at this time. MRI exam of the liver last November was unremarkable. The patient denies memory problems, ascites, peripheral edema, or coagulation difficulties. The patient has several family members with various types of cancer, but genetic evaluation has not shown any definite an inherited malignancy syndrome.   I have reviewed this patient's present history, medical and surgical past history, allergies and medications.     ROS: The remainder of the 10 point ROS is negative     Physical Exam: Blood pressure 120/66, pulse 80 and regular , weight 185 pounds and BMI of 30.32. Palmar erythema noted, but no other stigmata of chronic liver disease. General well developed well nourished patient in no acute distress, appearing their stated age Eyes PERRLA, no icterus, fundoscopic exam per opthamologist Skin no lesions noted Neck supple, no adenopathy, no thyroid enlargement, no tenderness Chest clear to percussion and auscultation Heart no significant murmurs, gallops or rubs  noted Abdomen no hepatosplenomegaly masses or tenderness, BS normal. There is a prominent left hepatic lobe with some nodularity but no tenderness. I cannot appreciate splenomegaly, ascites, or other abdominal abnormalities. Rectal inspection normal no fissures, or fistulae noted.  No masses or tenderness on digital exam. Stool guaiac negative. Extremities no acute joint lesions, edema, phlebitis or evidence of cellulitis. Neurologic patient oriented x 3, cranial nerves intact, no focal neurologic deficits noted. Psychological mental status normal and normal affect. Anoscopy: The anoscope was inserted without difficulty. There are 3 large columns of internal hemorrhoids with evidence of fresh bleeding present. I cannot appreciate any fistulae, fissures, or other abnormalities. There is a posterior skin tag noted.  Assessment and plan: Rectal bleeding from internal hemorrhoids. I placed her in Anusol-HC suppositories at bedtime as tolerated. We will renew her Lomotil prescription per her chronic diarrhea related to her previous surgical procedures. Previous diarrhea workup including colon biopsies has been negative. Also in the past she been treated for bacterial overgrowth syndrome to no avail. She continues with GERD, we have increased her Prilosec to 20 mg twice a day, and we'll schedule followup endoscopy and colonoscopy with propofol and nurse anesthesia. We will make adjustments in her diabetic medications for these procedures. This patient has been ill with multiple malignancies and other problems over the years, and actually today appears better than I have seen her in a long time. 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  No diagnosis found.

## 2011-12-12 DIAGNOSIS — E7800 Pure hypercholesterolemia, unspecified: Secondary | ICD-10-CM | POA: Diagnosis not present

## 2011-12-12 DIAGNOSIS — I10 Essential (primary) hypertension: Secondary | ICD-10-CM | POA: Diagnosis not present

## 2011-12-18 ENCOUNTER — Ambulatory Visit (AMBULATORY_SURGERY_CENTER): Payer: Medicare Other | Admitting: Gastroenterology

## 2011-12-18 ENCOUNTER — Encounter: Payer: Self-pay | Admitting: Gastroenterology

## 2011-12-18 VITALS — BP 133/75 | HR 74 | Temp 99.1°F | Resp 24 | Ht 65.0 in | Wt 185.0 lb

## 2011-12-18 DIAGNOSIS — K921 Melena: Secondary | ICD-10-CM | POA: Diagnosis not present

## 2011-12-18 DIAGNOSIS — Z85048 Personal history of other malignant neoplasm of rectum, rectosigmoid junction, and anus: Secondary | ICD-10-CM

## 2011-12-18 DIAGNOSIS — K620 Anal polyp: Secondary | ICD-10-CM | POA: Diagnosis not present

## 2011-12-18 DIAGNOSIS — K621 Rectal polyp: Secondary | ICD-10-CM | POA: Diagnosis not present

## 2011-12-18 DIAGNOSIS — Z85038 Personal history of other malignant neoplasm of large intestine: Secondary | ICD-10-CM | POA: Diagnosis not present

## 2011-12-18 DIAGNOSIS — K5730 Diverticulosis of large intestine without perforation or abscess without bleeding: Secondary | ICD-10-CM

## 2011-12-18 DIAGNOSIS — R197 Diarrhea, unspecified: Secondary | ICD-10-CM

## 2011-12-18 DIAGNOSIS — D126 Benign neoplasm of colon, unspecified: Secondary | ICD-10-CM

## 2011-12-18 DIAGNOSIS — Z8719 Personal history of other diseases of the digestive system: Secondary | ICD-10-CM | POA: Diagnosis not present

## 2011-12-18 DIAGNOSIS — I10 Essential (primary) hypertension: Secondary | ICD-10-CM | POA: Diagnosis not present

## 2011-12-18 DIAGNOSIS — E119 Type 2 diabetes mellitus without complications: Secondary | ICD-10-CM | POA: Diagnosis not present

## 2011-12-18 DIAGNOSIS — K2270 Barrett's esophagus without dysplasia: Secondary | ICD-10-CM | POA: Diagnosis not present

## 2011-12-18 LAB — GLUCOSE, CAPILLARY: Glucose-Capillary: 174 mg/dL — ABNORMAL HIGH (ref 70–99)

## 2011-12-18 MED ORDER — SODIUM CHLORIDE 0.9 % IV SOLN: 500.0000 mL | INTRAVENOUS | Status: DC

## 2011-12-18 NOTE — Op Note (Signed)
[facility] [address] [place], [place]  [address]  ENDOSCOPY PROCEDURE REPORT  PATIENT:  [name], [name]  MR#:  [id number] BIRTHDATE:  1941/03/08, 70 yrs. old  GENDER:  female  ENDOSCOPIST:  Vania Rea. Jarold Motto, MD, [facility] Referred by:  PROCEDURE DATE:  12/18/2011 PROCEDURE:  EGD with biopsy, [id number] ASA CLASS:  Class III INDICATIONS:  GERD FH BARRETT'S MUCOSA,CHRONIC GERD.  MEDICATIONS:   There was residual sedation effect present from prior procedure., propofol (Diprivan) 70, glycopyrrolate (Robinal) 0.2 mg IV TOPICAL ANESTHETIC:  Cetacaine Spray  DESCRIPTION OF PROCEDURE:   After the risks and benefits of the procedure were explained, informed consent was obtained.  The [facility] GIF-H180 [id number] endoscope was introduced through the mouth and advanced to the second portion of the duodenum.  The instrument was slowly withdrawn as the mucosa was fully examined. <<PROCEDUREIMAGES>>  irregular Z-line. BIOPSIES OF GE JUNCTION DONE  There were multiple polyps identified. SCATTERED SMALL FUNDAL HYPERPLASTIC POLYPS NOTED.  Otherwise the examination was normal. Retroflexed views revealed no abnormalities.    The scope was then withdrawn from the patient and the procedure completed.  COMPLICATIONS:  None  ENDOSCOPIC IMPRESSION: 1) Irregular Z-line 2) Polyps, multiple 3) Otherwise normal examination 1.R/O BARRETT'S MUCOSA 2.TREATED GERD 3.PPI GASTRIC POLYPS RECOMMENDATIONS: 1) Await biopsy results 2) continue PPI  ______________________________ Vania Rea. Jarold Motto, MD, [facility]  CC:  Pecola Lawless, MD  n. Rosalie DoctorMarland Kitchen   Vania Rea. Brynja Marker at 12/18/2011 02:31 PM  Jolee Ewing, [phone]

## 2011-12-18 NOTE — Patient Instructions (Addendum)
YOU HAD AN ENDOSCOPIC PROCEDURE TODAY AT THE [facility]: Refer to the procedure report that was given to you for any specific questions about what was found during the examination.  If the procedure report does not answer your questions, please call your gastroenterologist to clarify.  If you requested that your care partner not be given the details of your procedure findings, then the procedure report has been included in a sealed envelope for you to review at your convenience later.  YOU SHOULD EXPECT: Some feelings of bloating in the abdomen. Passage of more gas than usual.  Walking can help get rid of the air that was put into your GI tract during the procedure and reduce the bloating. If you had a lower endoscopy (such as a colonoscopy or flexible sigmoidoscopy) you may notice spotting of blood in your stool or on the toilet paper. If you underwent a bowel prep for your procedure, then you may not have a normal bowel movement for a few days.  DIET: Your first meal following the procedure should be a light meal and then it is ok to progress to your normal diet.  A half-sandwich or bowl of soup is an example of a good first meal.  Heavy or fried foods are harder to digest and may make you feel nauseous or bloated.  Likewise meals heavy in dairy and vegetables can cause extra gas to form and this can also increase the bloating.  Drink plenty of fluids but you should avoid alcoholic beverages for 24 hours.  ACTIVITY: Your care partner should take you home directly after the procedure.  You should plan to take it easy, moving slowly for the rest of the day.  You can resume normal activity the day after the procedure however you should NOT DRIVE or use heavy machinery for 24 hours (because of the sedation medicines used during the test).    SYMPTOMS TO REPORT IMMEDIATELY: A gastroenterologist can be reached at any hour.  During normal business hours, 8:30 AM to 5:00 PM Monday through Friday,  call [phone].  After hours and on weekends, please call the GI answering service at [phone] who will take a message and have the physician on call contact you.   Following lower endoscopy (colonoscopy or flexible sigmoidoscopy):  Excessive amounts of blood in the stool  Significant tenderness or worsening of abdominal pains  Swelling of the abdomen that is new, acute  Fever of 100F or higher  Following upper endoscopy (EGD)  Vomiting of blood or coffee ground material  New chest pain or pain under the shoulder blades  Painful or persistently difficult swallowing  New shortness of breath  Fever of 100F or higher  Black, tarry-looking stools  FOLLOW UP: If any biopsies were taken you will be contacted by phone or by letter within the next 1-3 weeks.  Call your gastroenterologist if you have not heard about the biopsies in 3 weeks.  Our staff will call the home number listed on your records the next business day following your procedure to check on you and address any questions or concerns that you may have at that time regarding the information given to you following your procedure. This is a courtesy call and so if there is no answer at the home number and we have not heard from you through the emergency physician on call, we will assume that you have returned to your regular daily activities without incident.  SIGNATURES/CONFIDENTIALITY: You and/or your care   partner have signed paperwork which will be entered into your electronic medical record.  These signatures attest to the fact that that the information above on your After Visit Summary has been reviewed and is understood.  Full responsibility of the confidentiality of this discharge information lies with you and/or your care-partner.  

## 2011-12-18 NOTE — Progress Notes (Addendum)
Patient did not have preoperative order for IV antibiotic SSI prophylaxis. (G8918)  Patient did not experience any of the following events: a burn prior to discharge; a fall within the facility; wrong site/side/patient/procedure/implant event; or a hospital transfer or hospital admission upon discharge from the facility. (G8907)  

## 2011-12-18 NOTE — Op Note (Signed)
[facility] [address] [place], [place]  [address]  COLONOSCOPY PROCEDURE REPORT  PATIENT:  [name] [name], [name] [name]  MR#:  [id number] BIRTHDATE:  1940/12/06, 70 yrs. old  GENDER:  female ENDOSCOPIST:  Vania Rea. Jarold Motto, MD, [facility] REF. BY: PROCEDURE DATE:  12/18/2011 PROCEDURE:  Colonoscopy with snare polypectomy ASA CLASS:  Class III INDICATIONS:  Colorectal cancer screening, average risk, history of colon cancer, hematochezia MEDICATIONS:   propofol (Diprivan) 70 mg IV  DESCRIPTION OF PROCEDURE:   After the risks and benefits and of the procedure were explained, informed consent was obtained. Digital rectal exam was performed and revealed no abnormalities. The LB CF-[id number] endoscope was introduced through the anus and advanced to the anastomosis.  The quality of the prep was excellent, using MoviPrep.  The instrument was then slowly withdrawn as the colon was fully examined. <<PROCEDUREIMAGES>>  FINDINGS:  There was a surgical anastomosis in the right colon. ILEO-COLONIC ANASTOMOSIS WIDELY PATENT AND NOMAL.SMALL BOWEL EXAMENED FOR 20 CM. AND ALSO APPEARS NORMAL.  Moderate diverticulosis was found in the sigmoid to descending colon segments.  A sessile polyp was found in the rectum. A SESSILE RECTAL POLYP COLD SNARE EXCISED.   Retroflexed views in the rectum revealed hemorrhoids.    The scope was then withdrawn from the patient and the procedure completed.  COMPLICATIONS:  None ENDOSCOPIC IMPRESSION: 1) Anastomosis in the right colon 2) Moderate diverticulosis in the sigmoid to descending colon segments 3) Sessile polyp in the rectum 4) Hemorrhoids RECOMMENDATIONS: 1) Await biopsy results 2) Repeat colonoscopy in 5 years if polyp adenomatous; otherwise 10 years 3) Continue current medications  REPEAT EXAM:  No  ______________________________ Vania Rea. Jarold Motto, MD, [facility]  CC:  Pecola Lawless, MD  n. Rosalie DoctorMarland Kitchen   Vania Rea. Patterson at 12/18/2011  02:25 PM  Jolee Ewing, [phone]

## 2011-12-19 ENCOUNTER — Telehealth: Payer: Self-pay | Admitting: *Deleted

## 2011-12-19 NOTE — Telephone Encounter (Signed)
Message left

## 2011-12-20 DIAGNOSIS — K2270 Barrett's esophagus without dysplasia: Secondary | ICD-10-CM

## 2011-12-20 HISTORY — DX: Barrett's esophagus without dysplasia: K22.70

## 2011-12-22 ENCOUNTER — Encounter: Payer: Self-pay | Admitting: Gastroenterology

## 2012-02-09 DIAGNOSIS — L989 Disorder of the skin and subcutaneous tissue, unspecified: Secondary | ICD-10-CM | POA: Diagnosis not present

## 2012-03-13 DIAGNOSIS — B079 Viral wart, unspecified: Secondary | ICD-10-CM | POA: Diagnosis not present

## 2012-03-20 ENCOUNTER — Telehealth: Payer: Self-pay | Admitting: Pharmacist

## 2012-03-20 NOTE — Telephone Encounter (Signed)
Pt called to report she started having pain in her L hip down to her lower leg a few weeks ago.  She thought it may be due to Crestor so she stopped it about a week ago and the pain got better.  She is concerned over what her options are.  She wants to try to stay on Crestor but does not want to take pain medications or be limited in her movements.  We discussed several options and decided to try Crestor 5mg  three days of the week.   Pt will call back in a week or so to let us know how she is doing.  If she tolerates this, will recheck her lipid panel in ~4-6 weeks to make sure cholesterol remains at goal.

## 2012-04-02 ENCOUNTER — Encounter: Payer: Self-pay | Admitting: Internal Medicine

## 2012-04-02 ENCOUNTER — Ambulatory Visit (INDEPENDENT_AMBULATORY_CARE_PROVIDER_SITE_OTHER): Payer: Medicare Other | Admitting: Internal Medicine

## 2012-04-02 VITALS — BP 126/78 | HR 91 | Temp 99.1°F | Wt 186.0 lb

## 2012-04-02 DIAGNOSIS — J209 Acute bronchitis, unspecified: Secondary | ICD-10-CM

## 2012-04-02 DIAGNOSIS — M2550 Pain in unspecified joint: Secondary | ICD-10-CM | POA: Diagnosis not present

## 2012-04-02 DIAGNOSIS — R042 Hemoptysis: Secondary | ICD-10-CM | POA: Diagnosis not present

## 2012-04-02 DIAGNOSIS — R413 Other amnesia: Secondary | ICD-10-CM

## 2012-04-02 DIAGNOSIS — J069 Acute upper respiratory infection, unspecified: Secondary | ICD-10-CM

## 2012-04-02 MED ORDER — AMOXICILLIN 500 MG PO CAPS: 500.0000 mg | ORAL_CAPSULE | Freq: Three times a day (TID) | ORAL | Status: DC

## 2012-04-02 MED ORDER — TRAMADOL HCL 50 MG PO TABS: ORAL_TABLET | ORAL | Status: DC

## 2012-04-02 NOTE — Progress Notes (Signed)
Subjective:    Patient ID: [name] [name], female    DOB: April 20, 1941, 71 y.o.   MRN: [id number]  HPI Extremity pain Location:L hip & knee Onset:1 month Trigger/injury:no Pain quality:sharp climbing stairs; dull ache in bed Pain severity:up to 9 Duration:just until position or activity changed Radiation:to L thigh & L ankle intermittently Treatment/response:Tylenol helps        Review of Systems Constitutional: no fever, chills, sweats, change in weight  Musculoskeletal:no  muscle cramps or pain; no  joint stiffness, redness, or swelling Skin:no rash, color/temp change Neuro: Some LLE weakness. She has chronic urinary & stool   incontinence for which  meds Rxed. Bladder surgery being considered. No  numbness and tingling Heme:no lymphadenopathy; abnormal bruising or bleeding. The question of memory loss has been raised by her family. She feels she is less attentive & focused since she has retired.  He describes purulent nasal discharge as well as cough with yellow-green sputum with some blood over the last week. She denies frontal headache or facial pain     Objective:   Physical Exam Gen.:  well-nourished in appearance. Alert, appropriate and cooperative throughout exam.  Eyes: No corneal or conjunctival inflammation noted.  Ears: External  ear exam reveals no significant lesions or deformities. Canals clear .TMs dull  Nose: External nasal exam reveals no deformity or inflammation. Nasal mucosa are pink and moist. No lesions or exudates noted.   Mouth: Oral mucosa and oropharynx reveal no lesions or exudates. Teeth in good repair. Hoarse Neck: No deformities, masses, or tenderness noted. Range of motion decreased. Lungs: Normal respiratory effort; chest expands symmetrically. Lungs are clear to auscultation without rales, wheezes, or increased work of breathing. Heart: Normal rate and rhythm. Normal S1 and S2. No gallop, click, or rub. Grade 1/6 systolic murmur . Abdomen: Bowel  sounds normal; abdomen soft and nontender. No masses or  organomegaly  noted.Dullness RUQ; ventral hernia                                                                                 Musculoskeletal/extremities: Accentuated curvature of upper thoracic  spine. There is some asymmetry of the posterior thoracic musculature suggesting occult scoliosis.  No clubbing, cyanosis, edema, or deformity noted. Range of motion  Decreased @ knees; crepitus L > R knee .Tone & strength  normal. Nail health  Good. Neg SLR. She pushes up out of the chair. Her gait is broad and slow Vascular: Carotid, radial artery, dorsalis pedis and  posterior tibial pulses are full and equal. No bruits present. Neurologic: Alert and oriented x3. She is an excellent historian providing elaborate details concerning her past medical history. Deep tendon reflexes symmetrical and normal.          Skin: Intact without suspicious lesions or rashes. Lymph: No cervical, axillary lymphadenopathy present. Psych: Mood and affect are normal. Normally interactive  Assessment & Plan:  #1 left hip and knee pain; this is most likely related to degenerative joint disease. She may have osteophytes in the lumbosacral spine which are causing some radiculopathy intermittently.  #2 acute bronchitis with streaky hemoptysis  #3 upper respiratory tract infection  #4 memory loss as per family; this is not documented today.  Plan: See orders and recommendations

## 2012-04-02 NOTE — Telephone Encounter (Signed)
Called to follow up with patient.  She did not start taking Crestor a few weeks ago when we last talked.  She wanted to wait and see if her pain improved.  Unfortunately the pain has not improved and she has an appt with her PCP today to discuss.  She did take 1 dose of Crestor last night.  Will follow up in another week or so.

## 2012-04-02 NOTE — Patient Instructions (Addendum)
As we discussed please do  mental exercises such as crossword puzzles & [place]uko, and keep  current with the news. Order for x-rays entered into  the computer; these will be performed at [address] [facility] [address] across from [facility]. No appointment is necessary.   If you activate My Chart; the results can be released to you as soon as they populate from the lab. If you choose not to use this program; the labs have to be reviewed, copied & mailed   causing a delay in getting the results to you.

## 2012-04-10 DIAGNOSIS — Z124 Encounter for screening for malignant neoplasm of cervix: Secondary | ICD-10-CM | POA: Diagnosis not present

## 2012-04-17 DIAGNOSIS — R5383 Other fatigue: Secondary | ICD-10-CM | POA: Diagnosis not present

## 2012-04-17 DIAGNOSIS — R5381 Other malaise: Secondary | ICD-10-CM | POA: Diagnosis not present

## 2012-04-17 DIAGNOSIS — I10 Essential (primary) hypertension: Secondary | ICD-10-CM | POA: Diagnosis not present

## 2012-04-17 DIAGNOSIS — E7800 Pure hypercholesterolemia, unspecified: Secondary | ICD-10-CM | POA: Diagnosis not present

## 2012-04-18 ENCOUNTER — Ambulatory Visit (INDEPENDENT_AMBULATORY_CARE_PROVIDER_SITE_OTHER)
Admission: RE | Admit: 2012-04-18 | Discharge: 2012-04-18 | Disposition: A | Discharge status: Home / Self Care | Payer: Medicare Other | Source: Ambulatory Visit | Attending: Internal Medicine | Admitting: Internal Medicine

## 2012-04-18 DIAGNOSIS — R042 Hemoptysis: Secondary | ICD-10-CM

## 2012-04-18 DIAGNOSIS — J209 Acute bronchitis, unspecified: Secondary | ICD-10-CM

## 2012-04-18 DIAGNOSIS — J40 Bronchitis, not specified as acute or chronic: Secondary | ICD-10-CM | POA: Diagnosis not present

## 2012-04-22 ENCOUNTER — Telehealth: Payer: Self-pay | Admitting: Internal Medicine

## 2012-04-22 MED ORDER — BISOPROLOL FUMARATE 5 MG PO TABS: ORAL_TABLET | ORAL | Status: DC

## 2012-04-22 NOTE — Telephone Encounter (Signed)
Refill: Bisoprol fum tab 5 mg. Take 1 tablet daily. Qty 90.

## 2012-04-22 NOTE — Telephone Encounter (Signed)
RX sent

## 2012-04-24 ENCOUNTER — Telehealth: Payer: Self-pay | Admitting: Internal Medicine

## 2012-04-24 NOTE — Telephone Encounter (Signed)
PT called in stated [facility] advised her we have not approved the  Bisprolol however we show it was sent on 12.16.13, can you re-send & advise pt once done as she is almost out cb# [phone]

## 2012-04-24 NOTE — Telephone Encounter (Signed)
I called [facility], rx was shipped out yesterday.  I called patient to give her a status update.

## 2012-05-09 ENCOUNTER — Ambulatory Visit (INDEPENDENT_AMBULATORY_CARE_PROVIDER_SITE_OTHER): Payer: Medicare Other

## 2012-05-09 DIAGNOSIS — Z23 Encounter for immunization: Secondary | ICD-10-CM

## 2012-05-28 ENCOUNTER — Other Ambulatory Visit: Payer: Self-pay | Admitting: *Deleted

## 2012-05-28 DIAGNOSIS — R197 Diarrhea, unspecified: Secondary | ICD-10-CM

## 2012-05-28 MED ORDER — DIPHENOXYLATE-ATROPINE 2.5-0.025 MG PO TABS: 1.0000 | ORAL_TABLET | Freq: Two times a day (BID) | ORAL | Status: DC | PRN

## 2012-05-28 NOTE — Telephone Encounter (Signed)
[name] Bison, MD 12/12/2011 8:16 AM Signed  History of Present Illness: This is a very nice 72 year old Caucasian female who has had surgical excision of breast cancer, bladder cancer, and colon cancer. She now presents with asymptomatic rectal bleeding. Last colonoscopy was 3 years ago. She is insulin-dependent diabetic and followed by Dr. Lurene Shadow. Patient has chronic acid reflux and is on Prilosec 20 mg one to 2 times a day. Last endoscopy was over 10 years ago. Her son recently was diagnosed with esophageal cancer related to Barrett's mucosa. The sizer rectal bleeding, the patient is asymptomatic except for chronic diarrhea state related to to previous: Surgical procedures for cancer and diverticulitis. She controls her symptomatology with Lomotil one to 2 times a day. 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 She is followed by Dr.Kahn in oncology, and is not on any chemotherapy at this time. MRI exam of the liver last November was unremarkable. The patient denies memory problems, ascites, peripheral edema, or coagulation difficulties. The patient has several family members with various types of cancer, but genetic evaluation has not shown any definite an inherited malignancy syndrome.   Tresa Endo, please refill the script. Thanks.

## 2012-05-28 NOTE — Telephone Encounter (Signed)
Would you like to refill lomotil?

## 2012-05-28 NOTE — Telephone Encounter (Signed)
Will have Dr. Jarold Motto sign prescription.  Prescription will be faxed to [facility]

## 2012-05-30 ENCOUNTER — Other Ambulatory Visit: Payer: Self-pay | Admitting: *Deleted

## 2012-05-30 DIAGNOSIS — E7800 Pure hypercholesterolemia, unspecified: Secondary | ICD-10-CM | POA: Diagnosis not present

## 2012-05-30 DIAGNOSIS — I10 Essential (primary) hypertension: Secondary | ICD-10-CM | POA: Diagnosis not present

## 2012-05-30 MED ORDER — OMEPRAZOLE 20 MG PO CPDR: 20.0000 mg | DELAYED_RELEASE_CAPSULE | Freq: Two times a day (BID) | ORAL | Status: DC

## 2012-07-10 DIAGNOSIS — C679 Malignant neoplasm of bladder, unspecified: Secondary | ICD-10-CM | POA: Diagnosis not present

## 2012-07-22 ENCOUNTER — Telehealth: Payer: Self-pay | Admitting: Internal Medicine

## 2012-07-22 NOTE — Telephone Encounter (Signed)
Refill: Crestor 10 mg tab. Take 1/2 tablet daily. Qty 45.

## 2012-07-24 MED ORDER — ROSUVASTATIN CALCIUM 10 MG PO TABS: ORAL_TABLET | ORAL | Status: DC

## 2012-07-24 NOTE — Telephone Encounter (Signed)
RX sent electronically 

## 2012-07-26 ENCOUNTER — Ambulatory Visit: Payer: Medicare Other | Admitting: Oncology

## 2012-07-26 ENCOUNTER — Other Ambulatory Visit: Payer: Medicare Other | Admitting: Lab

## 2012-07-26 ENCOUNTER — Telehealth: Payer: Self-pay | Admitting: *Deleted

## 2012-07-26 NOTE — Telephone Encounter (Signed)
Called and spoke with patient to cancel her appt. Due to Dr.Khan out sick.  Informed her we would call her back with another appt.

## 2012-08-15 ENCOUNTER — Other Ambulatory Visit (INDEPENDENT_AMBULATORY_CARE_PROVIDER_SITE_OTHER): Payer: Medicare Other

## 2012-08-15 DIAGNOSIS — E785 Hyperlipidemia, unspecified: Secondary | ICD-10-CM

## 2012-08-15 DIAGNOSIS — T887XXA Unspecified adverse effect of drug or medicament, initial encounter: Secondary | ICD-10-CM | POA: Diagnosis not present

## 2012-08-15 LAB — LIPID PANEL
Cholesterol: 185 mg/dL (ref 0–200)
LDL Cholesterol: 129 mg/dL — ABNORMAL HIGH (ref 0–99)
Triglycerides: 103 mg/dL (ref 0.0–149.0)
VLDL: 20.6 mg/dL (ref 0.0–40.0)

## 2012-08-23 ENCOUNTER — Telehealth: Payer: Self-pay | Admitting: *Deleted

## 2012-08-23 NOTE — Telephone Encounter (Signed)
Confirmed 08/30/12 appt w/ pt.  We rescheduled due to Dr. Welton Flakes being sick.

## 2012-08-30 ENCOUNTER — Ambulatory Visit (HOSPITAL_BASED_OUTPATIENT_CLINIC_OR_DEPARTMENT_OTHER): Payer: Medicare Other | Admitting: Oncology

## 2012-08-30 ENCOUNTER — Encounter: Payer: Self-pay | Admitting: Oncology

## 2012-08-30 VITALS — BP 134/78 | HR 98 | Temp 97.3°F | Resp 20 | Ht 65.0 in | Wt 190.6 lb

## 2012-08-30 DIAGNOSIS — C679 Malignant neoplasm of bladder, unspecified: Secondary | ICD-10-CM | POA: Diagnosis not present

## 2012-08-30 DIAGNOSIS — C189 Malignant neoplasm of colon, unspecified: Secondary | ICD-10-CM | POA: Diagnosis not present

## 2012-08-30 DIAGNOSIS — Z85038 Personal history of other malignant neoplasm of large intestine: Secondary | ICD-10-CM

## 2012-08-30 DIAGNOSIS — Z853 Personal history of malignant neoplasm of breast: Secondary | ICD-10-CM

## 2012-08-30 DIAGNOSIS — C50919 Malignant neoplasm of unspecified site of unspecified female breast: Secondary | ICD-10-CM | POA: Diagnosis not present

## 2012-08-30 DIAGNOSIS — K7689 Other specified diseases of liver: Secondary | ICD-10-CM | POA: Diagnosis not present

## 2012-08-30 NOTE — Progress Notes (Signed)
OFFICE PROGRESS NOTE  CC Dr. Sheryn Bison Marga Melnick, MD [phone] 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  #2 stage II adenocarcinoma of the colon diagnosed March 2005.  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  PRIOR THERAPY:  #1 breast cancer: Patient underwent a mastectomy of the right breast that showed 17 out of 13 lymph nodes positive for metastatic disease. She received 4 cycles of adjuvant Adriamycin and Cytoxan then 3 cycles of Taxol. She subsequently began adjuvant Arimidex from April 2002 to April 2007. Then in 2007 she was switched to Evista 60 mg daily and she completed this in 2013.  #2 colon cancer: Patient was treated with colectomy followed by adjuvant chemotherapy consisting of 5-FU and leucovorin for a duration of 4 cycles.  #3 papillary cell carcinoma of the bladder.  CURRENT THERAPY:Observation for all 3 primaries  INTERVAL HISTORY: [name] [name] 72 y.o. female returns for Followup visit today. Overall she is doing well she seems to be without any significant complaints. She denies any fevers chills night sweats headaches shortness of breath chest pains palpitations she has no myalgias or arthralgias. She still continues to play golf. However she is noted to have a fatty liver for which she is getting some therapy. She will have her mammogram performed in April of this year. All in all patient is doing quite well without any evidence of recurrent disease or  MEDICAL HISTORY: Past Medical History  Diagnosis Date  . Transaminase or LDH elevation   . Hiatal hernia   . GERD (gastroesophageal reflux disease)   . Diverticulosis of colon (without mention of hemorrhage) 09/26/2004,09/10/2006  . Hypertension   . Rectal bleeding   . Skin cancer   . HX: breast cancer   .  Adenocarcinoma, breast   . Bladder cancer   . Dysmetabolic syndrome X   . Diabetes mellitus   . Hyperlipidemia   . Hyperglycemia   . IBS (irritable bowel syndrome)   . Malignant neoplasm of colon, unspecified site 07/08/2003  . Allergy     environmental  . Arthritis   . Cataract     ALLERGIES:  has No Known Allergies.  MEDICATIONS:  Current Outpatient Prescriptions  Medication Sig Dispense Refill  . aspirin 81 MG tablet Take 81 mg by mouth daily.        . bisoprolol (ZEBETA) 5 MG tablet TAKE 1 TABLET DAILY  90 tablet  1  . diphenoxylate-atropine (LOMOTIL) 2.5-0.025 MG per tablet Take 1 tablet by mouth 2 (two) times daily as needed.  180 tablet  0  . fish oil-omega-3 fatty acids 1000 MG capsule Take 1 g by mouth daily.       Marland Kitchen glucose blood test strip 1 each by Other route as needed. Use as instructed       . insulin lispro (HUMALOG) 100 UNIT/ML injection Inject 6 Units into the skin 3 (three) times daily before meals.        . Insulin Zinc Human (NOVOLIN L [place]) Inject into the skin. Take 55 units in the morning and 70 units at night [employer] brand      . JANUMET 50-1000 MG per tablet       . metFORMIN (GLUMETZA) 1000 MG (MOD) 24 hr tablet Take 1,000 mg by mouth 2 (two) times daily with a meal.      .  NOVOLIN N RELION 100 UNIT/ML injection       . omeprazole (PRILOSEC) 20 MG capsule Take 1 capsule (20 mg total) by mouth 2 (two) times daily.  180 capsule  2  . PARoxetine (PAXIL) 20 MG tablet Take 20 mg by mouth every morning.        Marland Kitchen RELION INSULIN SYRINGE 1ML/31G 31G X 5/16" 1 ML MISC       . rosuvastatin (CRESTOR) 10 MG tablet 1/2 by mouth daily  45 tablet  0  . traMADol (ULTRAM) 50 MG tablet 1/2-1 evey 8 hrs prn pain  30 tablet  2  . hydrocortisone (ANUSOL-HC) 25 MG suppository Place 1 suppository (25 mg total) rectally at bedtime.  12 suppository  1  . hydrocortisone-pramoxine (ANALPRAM-HC) 2.5-1 % rectal cream Place 1 application rectally as needed.        No current  facility-administered medications for this visit.    SURGICAL HISTORY:  Past Surgical History  Procedure Laterality Date  . Sigmoid colectomy 2005    . Incisonal hernia    . Mastectomy and biopsy    . Bladder cancer 2001    . Colon cancer 2005    . Squamous cell ca  r  calf  2008    . Carpal tunnel release    . Esophageal dilation    . Colonoscopy    . Colon surgery  2005    colectomy    REVIEW OF SYSTEMS:  A comprehensive review of systems was negative.   PHYSICAL EXAMINATION: General appearance: alert, cooperative and appears stated age Neck: no adenopathy, no carotid bruit, no JVD, supple, symmetrical, trachea midline and thyroid not enlarged, symmetric, no tenderness/mass/nodules Lymph nodes: Cervical, supraclavicular, and axillary nodes normal. Resp: clear to auscultation bilaterally and normal percussion bilaterally Back: symmetric, no curvature. ROM normal. No CVA tenderness. Cardio: regular rate and rhythm, S1, S2 normal, no murmur, click, rub or gallop GI: soft, non-tender; bowel sounds normal; no masses,  no organomegaly Extremities: extremities normal, atraumatic, no cyanosis or edema Neurologic: Grossly normal  ECOG PERFORMANCE STATUS: 1 - Symptomatic but completely ambulatory  Blood pressure 134/78, pulse 98, temperature 97.3 F (36.3 C), temperature source Oral, resp. rate 20, height 5\' 5"  (1.651 m), weight 190 lb 9.6 oz (86.456 kg).  LABORATORY DATA: Lab Results  Component Value Date   WBC 7.3 07/28/2011   HGB 11.5* 07/28/2011   HCT 34.8 07/28/2011   MCV 90.5 07/28/2011   PLT 207 07/28/2011      Chemistry      Component Value Date/Time   NA 137 07/28/2011 1540   NA 138 01/18/2006 1426   K 3.9 07/28/2011 1540   K 4.2 01/18/2006 1426   CL 100 07/28/2011 1540   CL 99 01/18/2006 1426   CO2 23 07/28/2011 1540   CO2 29 01/18/2006 1426   BUN 12 07/28/2011 1540   BUN 19 01/18/2006 1426   CREATININE 0.63 07/28/2011 1540   CREATININE 0.9 01/18/2006 1426       Component Value Date/Time   CALCIUM 9.7 07/28/2011 1540   CALCIUM 10.3 01/18/2006 1426   ALKPHOS 108 08/25/2011 1042   AST 52* 08/15/2012 1203   ALT 39* 08/15/2012 1203   BILITOT 0.6 08/25/2011 1042       RADIOGRAPHIC STUDIES:  No results found.  ASSESSMENT: 72 year old female with  #1 stage III breast cancer colon cancer and papillary bladder cancer without evidence of recurrent disease.  #2 fatty liver.  #3 multiple other medical problems she  is seen by Dr. Clearance Coots.   PLAN:   From oncology perspective patient is doing quite well she is without any evidence of recurrent disease. She is encouraged to continue getting her colonoscopies as well as mammograms and physical exercise and a healthy diet. She also knows to call me with any problems questions or concerns. I will continue to see her on a yearly basis.   All questions were answered. The patient knows to call the clinic with any problems, questions or concerns. We can certainly see the patient much sooner if necessary.  I spent 25 minutes counseling the patient face to face. The total time spent in the appointment was 30 minutes.    Drue Second, MD [facility] [phone] (beeper) [phone] (Office)  08/30/2012, 3:52 PM

## 2012-08-30 NOTE — Patient Instructions (Addendum)
Doing well  We will continue to see you once a year

## 2012-09-02 ENCOUNTER — Telehealth: Payer: Self-pay | Admitting: Oncology

## 2012-09-05 ENCOUNTER — Other Ambulatory Visit: Payer: Self-pay | Admitting: *Deleted

## 2012-09-05 MED ORDER — BISOPROLOL FUMARATE 5 MG PO TABS: ORAL_TABLET | ORAL | Status: DC

## 2012-09-05 NOTE — Telephone Encounter (Signed)
Rx sent 

## 2012-10-23 DIAGNOSIS — Z9189 Other specified personal risk factors, not elsewhere classified: Secondary | ICD-10-CM | POA: Diagnosis not present

## 2012-11-28 ENCOUNTER — Encounter: Payer: Medicare Other | Admitting: Internal Medicine

## 2012-12-11 ENCOUNTER — Other Ambulatory Visit: Payer: Self-pay

## 2012-12-12 ENCOUNTER — Encounter: Payer: Self-pay | Admitting: Internal Medicine

## 2012-12-12 ENCOUNTER — Ambulatory Visit (INDEPENDENT_AMBULATORY_CARE_PROVIDER_SITE_OTHER): Payer: Medicare Other | Admitting: Internal Medicine

## 2012-12-12 VITALS — BP 126/80 | HR 77 | Temp 98.9°F | Resp 12 | Ht 65.0 in | Wt 188.0 lb

## 2012-12-12 DIAGNOSIS — E782 Mixed hyperlipidemia: Secondary | ICD-10-CM | POA: Diagnosis not present

## 2012-12-12 DIAGNOSIS — Z23 Encounter for immunization: Secondary | ICD-10-CM | POA: Diagnosis not present

## 2012-12-12 DIAGNOSIS — G4710 Hypersomnia, unspecified: Secondary | ICD-10-CM

## 2012-12-12 DIAGNOSIS — M2550 Pain in unspecified joint: Secondary | ICD-10-CM

## 2012-12-12 DIAGNOSIS — I10 Essential (primary) hypertension: Secondary | ICD-10-CM

## 2012-12-12 DIAGNOSIS — Z0000 Encounter for general adult medical examination without abnormal findings: Secondary | ICD-10-CM

## 2012-12-12 MED ORDER — TRAMADOL HCL 50 MG PO TABS: ORAL_TABLET | ORAL | Status: DC

## 2012-12-12 NOTE — Progress Notes (Signed)
Subjective:    Patient ID: [name] [name], female    DOB: 1940-12-20, 72 y.o.   MRN: [id number]  HPI Medicare Wellness Visit:  Psychosocial & medical history were reviewed as required by Medicare (abuse,antisocial behavioral risks,firearm risk).  Social history: caffeine:1-2 cups/day  , alcohol: no  ,  tobacco use: quit  1987 Exercise :  no Home & personal  safety / fall risk: no Limitations of activities of daily living:help with support if climbing on stool Seatbelt  and smoke alarm use:yes Power of Attorney/Living Will status : in place Ophthalmology exam status :not current Hearing evaluation status: not current Orientation :oriented X 3 Memory & recall : good Spelling  testing: good Active depression / anxiety:no Foreign travel history :  [place] 2006 Immunization status for Shingles /Flu/ PNA/ tetanus : Shingles needed Transfusion history:  ?with colectomy Preventive health surveillance status of colonoscopy, BMD , mammograms,PAP as per protocol/ SOC: current Dental care: every 6 mos  Chart reviewed &  Updated. Active issues reviewed & addressed.      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 Tramadol helps some  Husband describes snoring & questions apnea . She describes excess somnolence during the day       Objective:   Physical Exam Gen.: well-nourished in appearance. Alert, appropriate and cooperative  throughout exam.  Head: Normocephalic without obvious abnormalities Eyes: No corneal or conjunctival inflammation noted.  Extraocular motion intact. Vision grossly normal without lenses Ears: External  ear exam reveals no significant lesions or deformities. Canals clear .TMs normal. Hearing is grossly slightly decreased bilaterally. Nose: External nasal exam reveals no deformity or inflammation. Nasal mucosa are pink and moist. No lesions or exudates noted.   Mouth: Oral mucosa and oropharynx reveal no lesions or exudates. Teeth in good repair. Neck: No deformities, masses, or tenderness noted. Range of motion decreased to L > R. Thyroidnormal. Lungs: Normal respiratory effort; chest expands symmetrically. Lungs are clear to auscultation without rales, wheezes, or increased work of breathing. Heart: Normal rate and rhythm. Normal S1 and S2. No gallop, click, or rub. Grade 1/6 systolic murmur. Abdomen: Bowel sounds normal; abdomen soft and nontender. No masses or  Organomegaly. Ventral hernias noted. Genitalia: As per Gyn                                  Musculoskeletal/extremities: Accentuated curvature of upper thoracic  Spine. No clubbing, cyanosis, edema, or significant extremity  deformity noted. Range of motion decreased in legs .Tone & strength  Normal. Joints  reveal minor  DJD DIP changes. Nail health good. Able to lie down & sit up w/o help. Negative SLR bilaterally Vascular: Carotid, radial artery, dorsalis pedis and  posterior tibial pulses are full and equal. No bruits present. Neurologic: Alert and oriented x3. Deep tendon reflexes symmetrical and normal.          Skin: Intact without suspicious lesions or rashes. Lymph: No cervical, axillary lymphadenopathy present. Surgical  deficit R axilla Psych: Mood and affect are normal. Normally interactive                                                                                        Assessment & Plan:  #1 Medicare Wellness  Exam; criteria met ; data entered #2 Problem List/Diagnoses reviewed #3 ? Sleep apnea #4 arthralgias; R/O RA Plan:  Assessments made/ Orders entered

## 2012-12-12 NOTE — Patient Instructions (Addendum)
Share results with all non [facility] medical staff seen  

## 2012-12-13 ENCOUNTER — Encounter: Payer: Self-pay | Admitting: Internal Medicine

## 2012-12-13 ENCOUNTER — Other Ambulatory Visit: Payer: Self-pay | Admitting: Internal Medicine

## 2012-12-13 DIAGNOSIS — M2550 Pain in unspecified joint: Secondary | ICD-10-CM | POA: Insufficient documentation

## 2012-12-13 LAB — TSH: TSH: 1.52 u[IU]/mL (ref 0.35–5.50)

## 2012-12-13 LAB — SEDIMENTATION RATE: Sed Rate: 65 mm/hr — ABNORMAL HIGH (ref 0–22)

## 2012-12-16 DIAGNOSIS — Z79899 Other long term (current) drug therapy: Secondary | ICD-10-CM | POA: Diagnosis not present

## 2012-12-18 ENCOUNTER — Telehealth: Payer: Self-pay | Admitting: *Deleted

## 2012-12-18 DIAGNOSIS — R197 Diarrhea, unspecified: Secondary | ICD-10-CM

## 2012-12-18 MED ORDER — DIPHENOXYLATE-ATROPINE 2.5-0.025 MG PO TABS: 1.0000 | ORAL_TABLET | Freq: Two times a day (BID) | ORAL | Status: DC | PRN

## 2012-12-18 NOTE — Telephone Encounter (Signed)
ok 

## 2012-12-18 NOTE — Telephone Encounter (Signed)
Patient is requesting refill on Lomotil  Do you want to refill?

## 2012-12-19 ENCOUNTER — Telehealth: Payer: Self-pay | Admitting: Gastroenterology

## 2012-12-19 MED ORDER — OMEPRAZOLE 20 MG PO CPDR: 20.0000 mg | DELAYED_RELEASE_CAPSULE | Freq: Two times a day (BID) | ORAL | Status: DC

## 2012-12-19 NOTE — Telephone Encounter (Signed)
Called patient and left a message for patient to call us back Lomotil was already sent and Prilosec Patient needs an office visit for further refills

## 2012-12-20 ENCOUNTER — Telehealth: Payer: Self-pay | Admitting: *Deleted

## 2012-12-20 NOTE — Telephone Encounter (Signed)
I called [facility] at [phone] because they keep sending a fax for RX refill for Lomotil   Per Misty Stanley at [facility] just ignore the letter because Lomotil was shipped out today

## 2012-12-23 ENCOUNTER — Telehealth: Payer: Self-pay | Admitting: Gastroenterology

## 2012-12-23 NOTE — Telephone Encounter (Signed)
I called patient back and patient [place]. I advised patient that per her request we sent Lomotil and Prilosec to [facility] on 12-19-12. Patient said that is fine, she [place] her mail [place] the beach and she forgot she called to request it to be sent to [facility]. I advised patient that for further refills she will have to make a follow up appointment with Dr. Jarold Motto and we can schedule one today. Patient verbalized understanding and stated when she gets back from the beach in November she will make an appointment then.

## 2012-12-27 ENCOUNTER — Telehealth: Payer: Self-pay | Admitting: Internal Medicine

## 2012-12-27 DIAGNOSIS — M2550 Pain in unspecified joint: Secondary | ICD-10-CM

## 2012-12-27 DIAGNOSIS — R768 Other specified abnormal immunological findings in serum: Secondary | ICD-10-CM

## 2012-12-27 NOTE — Telephone Encounter (Signed)
Patient called requesting referral to rheumatologist per instructions on recent lab work. She does not have a preference.

## 2012-12-27 NOTE — Telephone Encounter (Signed)
Spoke with patient and advised that the referral has been placed.

## 2013-01-16 ENCOUNTER — Institutional Professional Consult (permissible substitution): Payer: Medicare Other | Admitting: Pulmonary Disease

## 2013-01-17 ENCOUNTER — Encounter: Payer: Self-pay | Admitting: Internal Medicine

## 2013-01-21 ENCOUNTER — Other Ambulatory Visit: Payer: Self-pay

## 2013-01-21 DIAGNOSIS — Z9011 Acquired absence of right breast and nipple: Secondary | ICD-10-CM

## 2013-01-21 DIAGNOSIS — Z1231 Encounter for screening mammogram for malignant neoplasm of breast: Secondary | ICD-10-CM

## 2013-01-22 ENCOUNTER — Other Ambulatory Visit: Payer: Self-pay | Admitting: *Deleted

## 2013-01-22 MED ORDER — BISOPROLOL FUMARATE 5 MG PO TABS: ORAL_TABLET | ORAL | Status: DC

## 2013-01-22 NOTE — Telephone Encounter (Signed)
Rx refilled for bisoprolol 5 mg.  Ag cma

## 2013-02-04 ENCOUNTER — Ambulatory Visit (INDEPENDENT_AMBULATORY_CARE_PROVIDER_SITE_OTHER): Payer: Medicare Other | Admitting: Pulmonary Disease

## 2013-02-04 ENCOUNTER — Encounter: Payer: Self-pay | Admitting: Pulmonary Disease

## 2013-02-04 VITALS — BP 118/68 | HR 84 | Temp 98.7°F | Ht 65.0 in | Wt 192.2 lb

## 2013-02-04 DIAGNOSIS — G4733 Obstructive sleep apnea (adult) (pediatric): Secondary | ICD-10-CM | POA: Diagnosis not present

## 2013-02-04 NOTE — Assessment & Plan Note (Signed)
The patient's history is very suggestive of clinically significant sleep apnea.  I have had a long discussion with her about sleep apnea, including its impact to her quality of life and cardiovascular health.  I think she needs to have a sleep study done for diagnosis, and she is an excellent candidate for home sleep testing.  The patient is agreeable to this approach.

## 2013-02-04 NOTE — Patient Instructions (Addendum)
Will schedule for home sleep testing, and will let you know results once available.  Work on weight reduction.

## 2013-02-04 NOTE — Progress Notes (Signed)
Subjective:    Patient ID: [name] [name], female    DOB: 07-27-1940, 72 y.o.   MRN: [id number]  HPI The patient is a 72 year old female who I've been asked to see for possible obstructive sleep apnea.  She has been noted to have loud snoring by her husband, as well as an abnormal breathing pattern during sleep.  She has frequent awakenings that she blames on nocturia, and has not rested in the mornings upon arising.  She notes significant daytime sleepiness, and her husband states that she throughout the day.  She denies significant sleepiness in the evenings watching television, but again has slept quite a bit during the day and also drinks caffeine.  She admits to having sleepiness with driving longer distances.  She states that her weight is up 7 pounds over the last 2 years, and her Epworth score today is very abnormal at 14.   Sleep Questionnaire What time do you typically go to bed?( Between what hours) 10p 10p at 1516 on 02/04/13 by Maisie Fus, CMA How long does it take you to fall asleep? within minutes within minutes at 1516 on 02/04/13 by Maisie Fus, CMA How many times during the night do you wake up? 5 5 at 1516 on 02/04/13 by Maisie Fus, CMA What time do you get out of bed to start your day? No Value 10-11a at 1516 on 02/04/13 by Maisie Fus, CMA Do you drive or operate heavy machinery in your occupation? No No at 1516 on 02/04/13 by Maisie Fus, CMA How much has your weight changed (up or down) over the past two years? (In pounds) 6 lb (2.722 kg) 6 lb (2.722 kg) at 1516 on 02/04/13 by Maisie Fus, CMA Have you ever had a sleep study before? No No at 1516 on 02/04/13 by Maisie Fus, CMA Do you currently use CPAP? No No at 1516 on 02/04/13 by Maisie Fus, CMA Do you wear oxygen at any time? No No at 1516 on 02/04/13 by Maisie Fus, CMA   Review of Systems  Constitutional: Negative for fever and unexpected weight change.  HENT: Negative  for ear pain, nosebleeds, congestion, sore throat, rhinorrhea, sneezing, trouble swallowing, dental problem, postnasal drip and sinus pressure.   Eyes: Negative for redness and itching.  Respiratory: Negative for cough, chest tightness, shortness of breath and wheezing.   Cardiovascular: Negative for palpitations and leg swelling.  Gastrointestinal: Negative for nausea and vomiting.  Genitourinary: Negative for dysuria.  Musculoskeletal: Negative for joint swelling.  Skin: Negative for rash.  Neurological: Negative for headaches.  Hematological: Does not bruise/bleed easily.  Psychiatric/Behavioral: Negative for dysphoric mood. The patient is not nervous/anxious.        Objective:   Physical Exam Constitutional:  Overweight female, no acute distress  HENT:  Nares patent without discharge  Oropharynx without exudate, palate and uvula are significantly elongated.   Eyes:  Perrla, eomi, no scleral icterus  Neck:  No JVD, no TMG  Cardiovascular:  Normal rate, regular rhythm, no rubs or gallops.  No murmurs        Intact distal pulses  Pulmonary :  Normal breath sounds, no stridor or respiratory distress   No rales, rhonchi, or wheezing  Abdominal:  Soft, nondistended, bowel sounds present.  No tenderness noted.   Musculoskeletal:  minimal lower extremity edema noted.  Lymph Nodes:  No cervical lymphadenopathy noted  Skin:  No cyanosis noted  Neurologic:  Alert, appropriate, moves  all 4 extremities without obvious deficit.         Assessment & Plan:

## 2013-02-05 DIAGNOSIS — I749 Embolism and thrombosis of unspecified artery: Secondary | ICD-10-CM | POA: Diagnosis not present

## 2013-02-05 DIAGNOSIS — M545 Low back pain: Secondary | ICD-10-CM | POA: Diagnosis not present

## 2013-02-05 DIAGNOSIS — M25569 Pain in unspecified knee: Secondary | ICD-10-CM | POA: Diagnosis not present

## 2013-02-05 DIAGNOSIS — G4700 Insomnia, unspecified: Secondary | ICD-10-CM | POA: Diagnosis not present

## 2013-02-06 ENCOUNTER — Ambulatory Visit
Admission: RE | Admit: 2013-02-06 | Discharge: 2013-02-06 | Disposition: A | Discharge status: Home / Self Care | Payer: Medicare Other | Source: Ambulatory Visit | Attending: Rheumatology | Admitting: Rheumatology

## 2013-02-06 ENCOUNTER — Other Ambulatory Visit: Payer: Self-pay | Admitting: Rheumatology

## 2013-02-06 DIAGNOSIS — M25569 Pain in unspecified knee: Secondary | ICD-10-CM | POA: Diagnosis not present

## 2013-02-06 DIAGNOSIS — Z79899 Other long term (current) drug therapy: Secondary | ICD-10-CM | POA: Diagnosis not present

## 2013-02-06 DIAGNOSIS — M47817 Spondylosis without myelopathy or radiculopathy, lumbosacral region: Secondary | ICD-10-CM | POA: Diagnosis not present

## 2013-02-06 DIAGNOSIS — M545 Low back pain, unspecified: Secondary | ICD-10-CM | POA: Diagnosis not present

## 2013-02-06 DIAGNOSIS — G4733 Obstructive sleep apnea (adult) (pediatric): Secondary | ICD-10-CM

## 2013-02-07 ENCOUNTER — Telehealth: Payer: Self-pay | Admitting: Pulmonary Disease

## 2013-02-07 ENCOUNTER — Other Ambulatory Visit: Payer: Self-pay | Admitting: Rheumatology

## 2013-02-07 DIAGNOSIS — G4733 Obstructive sleep apnea (adult) (pediatric): Secondary | ICD-10-CM

## 2013-02-07 DIAGNOSIS — M545 Low back pain: Secondary | ICD-10-CM

## 2013-02-07 NOTE — Telephone Encounter (Signed)
Called pt and discussed home sleep testing.  She has mild osa with ahi 6/hr.  Discussed a trial of weight loss alone vs more aggressive treatment with cpap or dental appliance.  She prefers cpap IF she is going to treat this aggressively.  I asked her to think about options, and to let me know once she gets back in town.

## 2013-02-08 ENCOUNTER — Ambulatory Visit
Admission: RE | Admit: 2013-02-08 | Discharge: 2013-02-08 | Disposition: A | Discharge status: Home / Self Care | Payer: Medicare Other | Source: Ambulatory Visit | Attending: Rheumatology | Admitting: Rheumatology

## 2013-02-08 DIAGNOSIS — M545 Low back pain: Secondary | ICD-10-CM

## 2013-02-08 DIAGNOSIS — M47817 Spondylosis without myelopathy or radiculopathy, lumbosacral region: Secondary | ICD-10-CM | POA: Diagnosis not present

## 2013-02-10 ENCOUNTER — Encounter: Payer: Self-pay | Admitting: Pulmonary Disease

## 2013-02-14 ENCOUNTER — Telehealth: Payer: Self-pay | Admitting: Pulmonary Disease

## 2013-02-14 NOTE — Telephone Encounter (Signed)
Let us know when she gets back in town, and can then send an order in.

## 2013-02-14 NOTE — Telephone Encounter (Signed)
Spoke with the pt's spouse  He is calling on behalf of the pt She wants to start using CPAP when they return from vacation on 02/24/13 Wants to go with [facility] as DME company  Please advise, thanks!

## 2013-02-14 NOTE — Telephone Encounter (Signed)
Called, spoke with pt's spouse.  Informed him of below per Dr. Shelle Iron.  He verbalized understanding and will call back once pt comes back in town so order can be sent.

## 2013-02-15 ENCOUNTER — Other Ambulatory Visit: Payer: Medicare Other

## 2013-02-25 ENCOUNTER — Telehealth: Payer: Self-pay | Admitting: Pulmonary Disease

## 2013-02-25 DIAGNOSIS — D485 Neoplasm of uncertain behavior of skin: Secondary | ICD-10-CM | POA: Diagnosis not present

## 2013-02-25 DIAGNOSIS — L299 Pruritus, unspecified: Secondary | ICD-10-CM | POA: Diagnosis not present

## 2013-02-25 NOTE — Telephone Encounter (Signed)
Spoke with patient's husband; states they were told to call back once they returned from out of town so the [facility] could get new start CPAP through [facility] order taken care of. Phone message sent to [facility].

## 2013-02-26 ENCOUNTER — Other Ambulatory Visit: Payer: Self-pay | Admitting: Pulmonary Disease

## 2013-02-26 DIAGNOSIS — G4733 Obstructive sleep apnea (adult) (pediatric): Secondary | ICD-10-CM

## 2013-02-26 NOTE — Telephone Encounter (Signed)
According to phone message 02/14/13 pt to call back once in town to send order for CPAP. Dr. Shelle Iron please advise what settings pt needs. thanks

## 2013-02-26 NOTE — Telephone Encounter (Signed)
There has not been an order placed for CPAP. Please place order and send to [facility] to arrange for CPAP and what pressure CPAP should be set on. Thanks, Ollen Gross

## 2013-02-26 NOTE — Telephone Encounter (Signed)
Order sent to [facility].  

## 2013-02-27 NOTE — Telephone Encounter (Signed)
Referral and study was faxed to [facility] yesterday (02/26/13). Called and spoke with Jasmine December at [facility] and they did receive order and everything they need to process order. Victorino Dike with [facility] will be calling patient to arrange set up. Called and spoke with Mr. Holness and he is aware that order has been faxed to [facility]. Nothing else needed at this time. Rhonda J Cobb

## 2013-02-27 NOTE — Telephone Encounter (Signed)
Have you guys taken care of this? Thanks!

## 2013-03-03 ENCOUNTER — Ambulatory Visit
Admission: RE | Admit: 2013-03-03 | Discharge: 2013-03-03 | Disposition: A | Discharge status: Home / Self Care | Payer: Medicare Other | Source: Ambulatory Visit

## 2013-03-03 DIAGNOSIS — Z1231 Encounter for screening mammogram for malignant neoplasm of breast: Secondary | ICD-10-CM | POA: Diagnosis not present

## 2013-03-03 DIAGNOSIS — Z9011 Acquired absence of right breast and nipple: Secondary | ICD-10-CM

## 2013-03-11 ENCOUNTER — Institutional Professional Consult (permissible substitution): Payer: Medicare Other | Admitting: Pulmonary Disease

## 2013-03-13 ENCOUNTER — Other Ambulatory Visit: Payer: Self-pay

## 2013-03-18 ENCOUNTER — Institutional Professional Consult (permissible substitution): Payer: Medicare Other | Admitting: Pulmonary Disease

## 2013-03-19 DIAGNOSIS — M545 Low back pain: Secondary | ICD-10-CM | POA: Diagnosis not present

## 2013-03-19 DIAGNOSIS — D529 Folate deficiency anemia, unspecified: Secondary | ICD-10-CM | POA: Diagnosis not present

## 2013-03-19 DIAGNOSIS — IMO0002 Reserved for concepts with insufficient information to code with codable children: Secondary | ICD-10-CM | POA: Diagnosis not present

## 2013-03-19 DIAGNOSIS — R700 Elevated erythrocyte sedimentation rate: Secondary | ICD-10-CM | POA: Diagnosis not present

## 2013-04-09 ENCOUNTER — Ambulatory Visit: Payer: Medicare Other | Admitting: Pulmonary Disease

## 2013-04-22 ENCOUNTER — Ambulatory Visit (INDEPENDENT_AMBULATORY_CARE_PROVIDER_SITE_OTHER): Payer: Medicare Other | Admitting: Pulmonary Disease

## 2013-04-22 ENCOUNTER — Encounter: Payer: Self-pay | Admitting: Pulmonary Disease

## 2013-04-22 VITALS — BP 128/70 | HR 78 | Temp 98.5°F | Ht 65.0 in | Wt 188.8 lb

## 2013-04-22 DIAGNOSIS — G4733 Obstructive sleep apnea (adult) (pediatric): Secondary | ICD-10-CM | POA: Diagnosis not present

## 2013-04-22 NOTE — Assessment & Plan Note (Signed)
The patient tells me that she has been wearing CPAP compliantly, but has not seen a significant change in her sleep or daytime alertness. She is using a full face mask, but has been having breakthrough snoring according to the husband. This may indicate that her pressure is not high enough, and therefore I would like to optimize her pressure with the automatic setting. If she continues to have daytime sleepiness issues, then I suspect is not related to her very mild sleep apnea.

## 2013-04-22 NOTE — Patient Instructions (Signed)
Will have your machine put on auto for the next few weeks to optimize your pressure.  Will also be able to see if your symptoms are better controlled.  Work on weight loss Please call me after you have been on the auto setting for 3 weeks, and give me some feedback with how things are going.

## 2013-04-22 NOTE — Progress Notes (Signed)
   Subjective:    Patient ID: [name] [name], female    DOB: May 17, 1940, 72 y.o.   MRN: [id number]  HPI The patient comes in today for followup of her mild obstructive sleep apnea. She was recently started on CPAP, and has been wearing the device compliantly. She is having no issues with her mask fit or pressure, but is having breakthrough snoring according to her husband. She has not seen increased sleep efficiency or improved daytime alertness as of yet. She uses a full face mask that is fitting satisfactorily. Her pressure has yet to be optimize at this time.   Review of Systems  Constitutional: Negative for fever and unexpected weight change.  HENT: Negative for congestion, dental problem, ear pain, nosebleeds, postnasal drip, rhinorrhea, sinus pressure, sneezing, sore throat and trouble swallowing.   Eyes: Negative for redness and itching.  Respiratory: Negative for cough, chest tightness, shortness of breath and wheezing.   Cardiovascular: Negative for palpitations and leg swelling.  Gastrointestinal: Negative for nausea and vomiting.  Genitourinary: Negative for dysuria.  Musculoskeletal: Negative for joint swelling.  Skin: Negative for rash.  Neurological: Negative for headaches.  Hematological: Does not bruise/bleed easily.  Psychiatric/Behavioral: Negative for dysphoric mood. The patient is not nervous/anxious.        Objective:   Physical Exam Overweight female in no acute distress Nose without purulence or discharge noted No skin breakdown or pressure necrosis from the CPAP mass Neck without lymphadenopathy or thyromegaly Lower extremities with minimal edema, no cyanosis Alert and oriented, moves all 4 extremities       Assessment & Plan:

## 2013-04-28 ENCOUNTER — Ambulatory Visit: Payer: Medicare Other | Admitting: Internal Medicine

## 2013-05-06 ENCOUNTER — Encounter: Payer: Self-pay | Admitting: Internal Medicine

## 2013-05-06 ENCOUNTER — Ambulatory Visit (INDEPENDENT_AMBULATORY_CARE_PROVIDER_SITE_OTHER): Payer: Medicare Other | Admitting: Internal Medicine

## 2013-05-06 ENCOUNTER — Other Ambulatory Visit: Payer: Self-pay | Admitting: Internal Medicine

## 2013-05-06 VITALS — BP 129/74 | HR 84 | Temp 99.0°F | Ht 65.0 in | Wt 191.0 lb

## 2013-05-06 DIAGNOSIS — D649 Anemia, unspecified: Secondary | ICD-10-CM | POA: Insufficient documentation

## 2013-05-06 DIAGNOSIS — G4733 Obstructive sleep apnea (adult) (pediatric): Secondary | ICD-10-CM | POA: Diagnosis not present

## 2013-05-06 DIAGNOSIS — Z23 Encounter for immunization: Secondary | ICD-10-CM

## 2013-05-06 DIAGNOSIS — M5416 Radiculopathy, lumbar region: Secondary | ICD-10-CM | POA: Insufficient documentation

## 2013-05-06 DIAGNOSIS — R5381 Other malaise: Secondary | ICD-10-CM

## 2013-05-06 DIAGNOSIS — R079 Chest pain, unspecified: Secondary | ICD-10-CM | POA: Diagnosis not present

## 2013-05-06 DIAGNOSIS — IMO0001 Reserved for inherently not codable concepts without codable children: Secondary | ICD-10-CM

## 2013-05-06 LAB — CBC WITH DIFFERENTIAL/PLATELET
Basophils Absolute: 0 10*3/uL (ref 0.0–0.1)
Basophils Relative: 0.4 % (ref 0.0–3.0)
Eosinophils Relative: 1.9 % (ref 0.0–5.0)
HCT: 30.8 % — ABNORMAL LOW (ref 36.0–46.0)
Lymphocytes Relative: 34.4 % (ref 12.0–46.0)
Lymphs Abs: 2 10*3/uL (ref 0.7–4.0)
MCV: 86.6 fl (ref 78.0–100.0)
Monocytes Absolute: 0.5 10*3/uL (ref 0.1–1.0)
Neutro Abs: 3.1 10*3/uL (ref 1.4–7.7)
Platelets: 208 10*3/uL (ref 150.0–400.0)
RDW: 16.6 % — ABNORMAL HIGH (ref 11.5–14.6)

## 2013-05-06 NOTE — Progress Notes (Signed)
Subjective:    Patient ID: [name] [name], female    DOB: 1941/01/21, 72 y.o.   MRN: [id number]  HPI  She describes fatigue which has been intermittent over the last few months. She does have sleep apnea and was prescribed CPAP in October of this year. This will most likely need adjusting; she has a followup appointment with Dr. Shelle Iron in the near future. She finds herself napping during the day because of somnolence  She also describes substernal dull pressure after walking approximately 40 yards. The pressure does not radiate to the neck or arm. It is not associated with nausea or diaphoresis.  Her diabetes is managed by Dr. Talmage Nap; she is insulin-dependent.  Her  lumbar radiculopathy is managed by Dr. Kellie Simmering. 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  She does have some chronic extrinsic symptoms of itchy, watery eyes. Vision is blurred related to presbyopia.  She's had minimal weight gain of 2 pounds.  Review of Systems She is not having fever, chills, sweats, or weight loss.  She denies diplopia or frank vision loss  She has no associated angioedema  She has no hoarseness or trouble swallowing.  She does not have pedal edema or paroxysmal nocturnal dyspnea  She never has constipation. She denies melena or rectal bleeding  She does have increased number skin tags but no other significant change in hair, skin,or nails  She denies abnormal bruising or bleeding or enlarged lymph nodes.  She does not believe that she has significant depression or fatigue.        Objective:   Physical Exam Gen.: Adequately nourished in appearance; but central weight excess. Alert, appropriate and cooperative throughout exam.  Head: Normocephalic without obvious abnormalities Eyes: No corneal or conjunctival inflammation noted. Pupils equal round reactive to light and accommodation. Extraocular motion  intact. No proptosis or lid lag  Nose: External nasal exam reveals no deformity or inflammation. Nasal mucosa are pink and moist. No lesions or exudates noted.  Mouth: Oral mucosa and oropharynx reveal no lesions or exudates. Teeth in good repair. Neck: No deformities, masses, or tenderness noted. Range of motion good. Thyroid small. Lungs: Normal respiratory effort; chest expands symmetrically. Lungs are clear to auscultation without rales, wheezes, or increased work of breathing. Heart: Normal rate and rhythm. Normal S1 and S2. No gallop, click, or rub. Grade 1/6 systolic murmur. Abdomen: Bowel sounds normal; abdomen soft and nontender. No masses, organomegaly or hernias noted.Protuberant                                  Musculoskeletal/extremities: Markedly accentuated curvature of upper thoracic spine. No clubbing, cyanosis, edema, or significant extremity  deformity noted. Range of motion normal .Tone & strength normal. Hand joints normal . Fingernail  health good. Able to lie down & sit up w/o help. Negative SLR bilaterally Vascular: Carotid, radial artery, dorsalis pedis and  posterior tibial pulses are full and equal. No bruits present. Neurologic: Alert and oriented x3. Deep tendon reflexes symmetrical and normal.       Skin: Intact without suspicious lesions or rashes. Lymph: No cervical, axillary lymphadenopathy present. Psych: Mood and affect are normal. Normally interactive  Assessment & Plan:  #1 fatigue  #2 exertional chest pain  #3 sleep apnea  Plan: See orders.  Cardiology referral will be made if there is no other explanation for the fatigue and chest pain after review of labs.  Medications cannot be renewed as the patient was not sure of them. Attempts will be made to clarify the med list by phone.

## 2013-05-06 NOTE — Progress Notes (Signed)
Pre visit review using our clinic review tool, if applicable. No additional management support is needed unless otherwise documented below in the visit note. 

## 2013-05-06 NOTE — Patient Instructions (Signed)
Your next office appointment will be determined based upon review of your pending labs . Those instructions will be transmitted to you through My Chart . Followup as needed for your this acute issue. Please report any significant change in your symptoms. Share results with Dr Talmage Nap & Dr Kellie Simmering

## 2013-05-07 ENCOUNTER — Telehealth: Payer: Self-pay

## 2013-05-07 NOTE — Telephone Encounter (Signed)
Per Alfonse Flavors called patient and reviewed and updated medication list and pharmacies.

## 2013-05-15 ENCOUNTER — Telehealth: Payer: Self-pay | Admitting: Gastroenterology

## 2013-05-15 DIAGNOSIS — R197 Diarrhea, unspecified: Secondary | ICD-10-CM

## 2013-05-15 MED ORDER — OMEPRAZOLE 20 MG PO CPDR: 20.0000 mg | DELAYED_RELEASE_CAPSULE | Freq: Two times a day (BID) | ORAL | Status: DC

## 2013-05-15 MED ORDER — DIPHENOXYLATE-ATROPINE 2.5-0.025 MG PO TABS: 1.0000 | ORAL_TABLET | Freq: Two times a day (BID) | ORAL | Status: DC | PRN

## 2013-05-15 NOTE — Telephone Encounter (Signed)
I called patient back. I advised patient I can send refill in but we do need to make her a follow up office visit because Dr. Sharlett Iles likes yearly follow ups. Patient made follow up appointment and I sent WP'Y per patient's request to [place]. Patient verbalized understanding to keep appointment for further refills.

## 2013-05-16 ENCOUNTER — Encounter: Payer: Self-pay | Admitting: Internal Medicine

## 2013-05-28 ENCOUNTER — Encounter: Payer: Self-pay | Admitting: *Deleted

## 2013-05-30 ENCOUNTER — Encounter: Payer: Self-pay | Admitting: Pulmonary Disease

## 2013-05-30 ENCOUNTER — Telehealth: Payer: Self-pay | Admitting: Pulmonary Disease

## 2013-05-30 DIAGNOSIS — G4733 Obstructive sleep apnea (adult) (pediatric): Secondary | ICD-10-CM

## 2013-05-30 NOTE — Telephone Encounter (Signed)
This has been placed in [facility] green folder for review

## 2013-05-30 NOTE — Telephone Encounter (Signed)
Spoke with spouse. He reports a download was done off pt machine about 2 weeks ago. Requesting results. Please advise Ashtyn if we have results? thanks

## 2013-05-30 NOTE — Telephone Encounter (Signed)
Let pt know that her sleep apnea is well controlled on the auto setting.  No significant mask leaks.  If she feels better on the auto, would send an order to leave her device on the auto setting.  If she prefers to try a fixed pressure, her optimal pressure appears to be 13cm.

## 2013-05-30 NOTE — Telephone Encounter (Signed)
Spoke with Mr Peasley-- Aware of results. Pt has decided she would like to stay on Auto setting. Order to be sent to DME to keep pt at current setting is AUTO.  Nothing further needed.

## 2013-06-03 ENCOUNTER — Other Ambulatory Visit (INDEPENDENT_AMBULATORY_CARE_PROVIDER_SITE_OTHER): Payer: Medicare Other

## 2013-06-03 ENCOUNTER — Encounter: Payer: Self-pay | Admitting: Gastroenterology

## 2013-06-03 ENCOUNTER — Ambulatory Visit (INDEPENDENT_AMBULATORY_CARE_PROVIDER_SITE_OTHER): Payer: Medicare Other | Admitting: Gastroenterology

## 2013-06-03 VITALS — BP 124/70 | HR 88 | Ht 65.0 in | Wt 189.4 lb

## 2013-06-03 DIAGNOSIS — D649 Anemia, unspecified: Secondary | ICD-10-CM

## 2013-06-03 DIAGNOSIS — K7689 Other specified diseases of liver: Secondary | ICD-10-CM | POA: Diagnosis not present

## 2013-06-03 DIAGNOSIS — Z85038 Personal history of other malignant neoplasm of large intestine: Secondary | ICD-10-CM

## 2013-06-03 DIAGNOSIS — C679 Malignant neoplasm of bladder, unspecified: Secondary | ICD-10-CM | POA: Diagnosis not present

## 2013-06-03 DIAGNOSIS — K760 Fatty (change of) liver, not elsewhere classified: Secondary | ICD-10-CM

## 2013-06-03 DIAGNOSIS — C50919 Malignant neoplasm of unspecified site of unspecified female breast: Secondary | ICD-10-CM | POA: Diagnosis not present

## 2013-06-03 LAB — IBC PANEL
IRON: 62 ug/dL (ref 42–145)
SATURATION RATIOS: 11.6 % — AB (ref 20.0–50.0)
TRANSFERRIN: 382.2 mg/dL — AB (ref 212.0–360.0)

## 2013-06-03 LAB — CBC WITH DIFFERENTIAL/PLATELET
Basophils Absolute: 0 10*3/uL (ref 0.0–0.1)
Basophils Relative: 0.3 % (ref 0.0–3.0)
EOS PCT: 1.8 % (ref 0.0–5.0)
Eosinophils Absolute: 0.1 10*3/uL (ref 0.0–0.7)
HCT: 31.3 % — ABNORMAL LOW (ref 36.0–46.0)
Hemoglobin: 10.3 g/dL — ABNORMAL LOW (ref 12.0–15.0)
LYMPHS PCT: 31.6 % (ref 12.0–46.0)
Lymphs Abs: 1.7 10*3/uL (ref 0.7–4.0)
MCHC: 32.8 g/dL (ref 30.0–36.0)
MCV: 84.8 fl (ref 78.0–100.0)
MONO ABS: 0.6 10*3/uL (ref 0.1–1.0)
MONOS PCT: 10.5 % (ref 3.0–12.0)
NEUTROS PCT: 55.8 % (ref 43.0–77.0)
Neutro Abs: 3 10*3/uL (ref 1.4–7.7)
PLATELETS: 221 10*3/uL (ref 150.0–400.0)
RBC: 3.69 Mil/uL — AB (ref 3.87–5.11)
RDW: 16.8 % — ABNORMAL HIGH (ref 11.5–14.6)
WBC: 5.3 10*3/uL (ref 4.5–10.5)

## 2013-06-03 LAB — VITAMIN B12: Vitamin B-12: 376 pg/mL (ref 211–911)

## 2013-06-03 LAB — FOLATE: FOLATE: 13.2 ng/mL (ref >5.9)

## 2013-06-03 LAB — FERRITIN: Ferritin: 15.6 ng/mL (ref 10.0–291.0)

## 2013-06-03 NOTE — Patient Instructions (Addendum)
You have been scheduled for an abdominal ultrasound at [facility] (1st floor of hospital) on 06-06-2013 at 8:30 am. Please arrive 15 minutes prior to your appointment for registration. Make certain not to have anything to eat or drink 6 hours prior to your appointment. Should you need to reschedule your appointment, please contact radiology at [phone]. This test typically takes about 30 minutes to perform.  Your physician has requested that you go to the basement for the following lab work before leaving today: Anemia Panel  CBC CEA

## 2013-06-03 NOTE — Progress Notes (Signed)
This is a  73 year old Caucasian female who has had previous surgery and chemotherapy for colon cancer, bladder cancer, and breast cancer.  She colonoscopy and endoscopy in August of 2013.  She has an enlarged liver which is been felt secondary to fatty infiltration of her liver.  She also has chronic anemia, and hemoglobin was 9.9 in December.  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  She denies any symptoms of anemia such as shortness of breath, palpitations, et Ronney Asters.  Her appetite is good her weight is stable.  She denies abdominal pain, nausea vomiting, acid reflux or dysphagia.  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  Do not think she is seeing oncology since April of 2014 her hemoglobin was 10.5.  Patient has had some recent dental surgery with some local mouth bleeding but no other history of known bleeding sources.  She generally follows a regular diet.   Current Medications, Allergies, Past Medical History, Past Surgical History, Family History and Social History were reviewed in [employer] record.  ROS: All systems were reviewed and are negative unless otherwise stated in the HPI.          Physical Exam: Blood pressure 124/70, pulse 88 and regular, and weight 189 with a BMI of 31.52.  I cannot appreciate stigmata of chronic liver disease.  Her abdomen shows enlarged liver, left lobe in the mid abdominal area with some firmness but no nodularity or tenderness.  Abdominal exam otherwise unremarkable.  Inspection of rectum is unremarkable as is rectal exam with formed stool present which is guaiac negative.    Assessment and Plan: Anemia probably of chronic  disease associated with her multiple previous malignancies and chemotherapy.  Review of her CBC shows normal white count platelet count.  Her enlarged liver has been noted for several years in several scans including MRI that have shown no evidence of metastatic disease.  Will check anemia profile, followup CBC, CEA level, and we will do upper abdominal ultrasound exam followup.  She otherwise his continue medications as listed and reviewed.  As before, she is up-to-date on her endoscopy and colonoscopy exams.  She may need referral to hematology for further workup and evaluation.  Review of her liver enzymes his mild transaminase elevations consistent with fatty liver.  Abnormal ultrasound results, she may need ultrasound-guided liver biopsy.   Cc: Dr. Unice Cobble

## 2013-06-03 NOTE — Progress Notes (Signed)
ear ago  Current Medications, Allergies, Past Medical History, Past Surgical History, Family History and Social History were reviewed in [employer] record.  ROS: All systems were reviewed and are negative unless otherwise stated in the HPI.          Physical Exam:    Assessment and Plan:

## 2013-06-04 DIAGNOSIS — IMO0001 Reserved for inherently not codable concepts without codable children: Secondary | ICD-10-CM | POA: Diagnosis not present

## 2013-06-04 LAB — CEA: CEA: 3.3 ng/mL (ref 0.0–5.0)

## 2013-06-05 ENCOUNTER — Telehealth: Payer: Self-pay | Admitting: Gastroenterology

## 2013-06-05 DIAGNOSIS — D649 Anemia, unspecified: Secondary | ICD-10-CM

## 2013-06-05 MED ORDER — FERROUS FUM-IRON POLYSACCH-FA 162-115.2-1 MG PO CAPS: 1.0000 | ORAL_CAPSULE | Freq: Every day | ORAL | Status: DC

## 2013-06-05 NOTE — Telephone Encounter (Signed)
Notes Recorded by Sable Feil, MD on 06/03/2013 at 2:44 PM Prescribed tandem daily for 2 months with followup CBC, ferritin, iron studies, an appointment with Dr.[name] in 2 months. Her serum iron is slightly low. Ultrasound report pending.          Informed pt of low iron and she needs to begin Tandem daily for 2 months; we will repeat the labs and then she will f/u with Dr Hilarie Fredrickson. Pt stated understanding. Ordered med, but appts aren't available by 1st week of March; needs something around the end of March or 1st of April. Informed pt we will call her back.

## 2013-06-06 ENCOUNTER — Ambulatory Visit (HOSPITAL_COMMUNITY)
Admission: RE | Admit: 2013-06-06 | Discharge: 2013-06-06 | Disposition: A | Discharge status: Home / Self Care | Payer: Medicare Other | Source: Ambulatory Visit | Attending: Gastroenterology | Admitting: Gastroenterology

## 2013-06-06 DIAGNOSIS — K760 Fatty (change of) liver, not elsewhere classified: Secondary | ICD-10-CM

## 2013-06-06 DIAGNOSIS — R161 Splenomegaly, not elsewhere classified: Secondary | ICD-10-CM | POA: Diagnosis not present

## 2013-06-06 DIAGNOSIS — K7460 Unspecified cirrhosis of liver: Secondary | ICD-10-CM | POA: Diagnosis not present

## 2013-06-06 DIAGNOSIS — K7689 Other specified diseases of liver: Secondary | ICD-10-CM | POA: Insufficient documentation

## 2013-06-17 ENCOUNTER — Ambulatory Visit (INDEPENDENT_AMBULATORY_CARE_PROVIDER_SITE_OTHER): Payer: Medicare Other | Admitting: Family Medicine

## 2013-06-17 ENCOUNTER — Encounter: Payer: Self-pay | Admitting: Family Medicine

## 2013-06-17 VITALS — BP 140/72 | HR 96 | Temp 99.6°F | Ht 65.0 in | Wt 190.0 lb

## 2013-06-17 DIAGNOSIS — B0229 Other postherpetic nervous system involvement: Secondary | ICD-10-CM | POA: Diagnosis not present

## 2013-06-17 DIAGNOSIS — R197 Diarrhea, unspecified: Secondary | ICD-10-CM | POA: Diagnosis not present

## 2013-06-17 DIAGNOSIS — M5416 Radiculopathy, lumbar region: Secondary | ICD-10-CM

## 2013-06-17 DIAGNOSIS — R011 Cardiac murmur, unspecified: Secondary | ICD-10-CM

## 2013-06-17 DIAGNOSIS — IMO0002 Reserved for concepts with insufficient information to code with codable children: Secondary | ICD-10-CM

## 2013-06-17 DIAGNOSIS — G4733 Obstructive sleep apnea (adult) (pediatric): Secondary | ICD-10-CM

## 2013-06-17 DIAGNOSIS — M7660 Achilles tendinitis, unspecified leg: Secondary | ICD-10-CM | POA: Diagnosis not present

## 2013-06-17 DIAGNOSIS — E782 Mixed hyperlipidemia: Secondary | ICD-10-CM

## 2013-06-17 DIAGNOSIS — I10 Essential (primary) hypertension: Secondary | ICD-10-CM

## 2013-06-17 MED ORDER — NAPROXEN SODIUM 220 MG PO TABS: 220.0000 mg | ORAL_TABLET | Freq: Two times a day (BID) | ORAL | Status: DC | PRN

## 2013-06-17 MED ORDER — ROSUVASTATIN CALCIUM 10 MG PO TABS: ORAL_TABLET | ORAL | Status: DC

## 2013-06-17 MED ORDER — BISOPROLOL FUMARATE 5 MG PO TABS: ORAL_TABLET | ORAL | Status: DC

## 2013-06-17 MED ORDER — DIPHENOXYLATE-ATROPINE 2.5-0.025 MG PO TABS: 1.0000 | ORAL_TABLET | Freq: Two times a day (BID) | ORAL | Status: DC | PRN

## 2013-06-17 NOTE — Progress Notes (Signed)
   Subjective:    Patient ID: [name] [name], female    DOB: October 22, 1940, 73 y.o.   MRN: [id number]  HPI 73 yr old female to establish after transferring from Dr. Linna Darner. She has a number of ongoing issues and sees multiple specialists. She had a wellness exam with labs last August. She does have one new issue to discuss today. For the past 3 weeks she has had intermittent sharp pains behind the left heel and in the lower Achilles tendon. No hx of recent trauma. She enjoys playing golf but has not done so for several months due to the cold weather. She tries to wear supportive shoes and even places gel arch inserts in her shoes. On my exam I note a cardiac murmur, but she says no one has ever mentioned this to her. No SOB or chest pain.    Review of Systems  Constitutional: Negative.   Respiratory: Negative.   Cardiovascular: Negative.   Musculoskeletal: Positive for arthralgias. Negative for gait problem and joint swelling.       Objective:   Physical Exam  Constitutional: She appears well-developed and well-nourished.  Cardiovascular: Normal rate, regular rhythm and intact distal pulses.  Exam reveals no gallop and no friction rub.   2/6 SM over the aortic area   Musculoskeletal: She exhibits no edema.  Tender over the insertion of the Achilles tendon onto the right heel , no swelling           Assessment & Plan:  She has some tendonitis in the Achilles. Wear supportive shoes, apply moist heat. Try Aleve 2 tabs BID for a week or two to reduce inflammation. Set up an ECHO to evaluate the murmur.

## 2013-06-17 NOTE — Progress Notes (Signed)
Pre visit review using our clinic review tool, if applicable. No additional management support is needed unless otherwise documented below in the visit note. 

## 2013-07-02 ENCOUNTER — Other Ambulatory Visit (HOSPITAL_COMMUNITY): Payer: Medicare Other

## 2013-07-07 ENCOUNTER — Encounter (HOSPITAL_COMMUNITY): Payer: Self-pay | Admitting: Emergency Medicine

## 2013-07-07 ENCOUNTER — Emergency Department (HOSPITAL_COMMUNITY): Payer: Medicare Other

## 2013-07-07 ENCOUNTER — Inpatient Hospital Stay (HOSPITAL_COMMUNITY): Payer: Medicare Other

## 2013-07-07 ENCOUNTER — Ambulatory Visit: Payer: Medicare Other | Admitting: Family Medicine

## 2013-07-07 ENCOUNTER — Inpatient Hospital Stay (HOSPITAL_COMMUNITY)
Admission: EM | Admit: 2013-07-07 | Discharge: 2013-07-10 | DRG: 293 | Disposition: A | Discharge status: Home / Self Care | Payer: Medicare Other | Attending: Family Medicine | Admitting: Family Medicine

## 2013-07-07 DIAGNOSIS — G4733 Obstructive sleep apnea (adult) (pediatric): Secondary | ICD-10-CM

## 2013-07-07 DIAGNOSIS — M5416 Radiculopathy, lumbar region: Secondary | ICD-10-CM

## 2013-07-07 DIAGNOSIS — E1165 Type 2 diabetes mellitus with hyperglycemia: Secondary | ICD-10-CM

## 2013-07-07 DIAGNOSIS — Z8249 Family history of ischemic heart disease and other diseases of the circulatory system: Secondary | ICD-10-CM

## 2013-07-07 DIAGNOSIS — R740 Nonspecific elevation of levels of transaminase and lactic acid dehydrogenase [LDH]: Secondary | ICD-10-CM

## 2013-07-07 DIAGNOSIS — I208 Other forms of angina pectoris: Secondary | ICD-10-CM | POA: Diagnosis not present

## 2013-07-07 DIAGNOSIS — R0600 Dyspnea, unspecified: Secondary | ICD-10-CM

## 2013-07-07 DIAGNOSIS — I059 Rheumatic mitral valve disease, unspecified: Secondary | ICD-10-CM | POA: Diagnosis not present

## 2013-07-07 DIAGNOSIS — I209 Angina pectoris, unspecified: Secondary | ICD-10-CM | POA: Diagnosis present

## 2013-07-07 DIAGNOSIS — R0789 Other chest pain: Secondary | ICD-10-CM | POA: Diagnosis not present

## 2013-07-07 DIAGNOSIS — Z794 Long term (current) use of insulin: Secondary | ICD-10-CM | POA: Diagnosis not present

## 2013-07-07 DIAGNOSIS — I5033 Acute on chronic diastolic (congestive) heart failure: Principal | ICD-10-CM

## 2013-07-07 DIAGNOSIS — D649 Anemia, unspecified: Secondary | ICD-10-CM | POA: Diagnosis not present

## 2013-07-07 DIAGNOSIS — E785 Hyperlipidemia, unspecified: Secondary | ICD-10-CM | POA: Diagnosis present

## 2013-07-07 DIAGNOSIS — Z853 Personal history of malignant neoplasm of breast: Secondary | ICD-10-CM

## 2013-07-07 DIAGNOSIS — Z79899 Other long term (current) drug therapy: Secondary | ICD-10-CM

## 2013-07-07 DIAGNOSIS — Z8551 Personal history of malignant neoplasm of bladder: Secondary | ICD-10-CM

## 2013-07-07 DIAGNOSIS — R079 Chest pain, unspecified: Secondary | ICD-10-CM | POA: Diagnosis not present

## 2013-07-07 DIAGNOSIS — Z85038 Personal history of other malignant neoplasm of large intestine: Secondary | ICD-10-CM | POA: Diagnosis not present

## 2013-07-07 DIAGNOSIS — R0602 Shortness of breath: Secondary | ICD-10-CM | POA: Diagnosis not present

## 2013-07-07 DIAGNOSIS — E669 Obesity, unspecified: Secondary | ICD-10-CM | POA: Diagnosis present

## 2013-07-07 DIAGNOSIS — I509 Heart failure, unspecified: Secondary | ICD-10-CM

## 2013-07-07 DIAGNOSIS — K219 Gastro-esophageal reflux disease without esophagitis: Secondary | ICD-10-CM | POA: Diagnosis present

## 2013-07-07 DIAGNOSIS — K589 Irritable bowel syndrome without diarrhea: Secondary | ICD-10-CM | POA: Diagnosis present

## 2013-07-07 DIAGNOSIS — Z833 Family history of diabetes mellitus: Secondary | ICD-10-CM | POA: Diagnosis not present

## 2013-07-07 DIAGNOSIS — IMO0001 Reserved for inherently not codable concepts without codable children: Secondary | ICD-10-CM

## 2013-07-07 DIAGNOSIS — R935 Abnormal findings on diagnostic imaging of other abdominal regions, including retroperitoneum: Secondary | ICD-10-CM | POA: Diagnosis not present

## 2013-07-07 DIAGNOSIS — Z85828 Personal history of other malignant neoplasm of skin: Secondary | ICD-10-CM

## 2013-07-07 DIAGNOSIS — I10 Essential (primary) hypertension: Secondary | ICD-10-CM | POA: Diagnosis not present

## 2013-07-07 DIAGNOSIS — Z8719 Personal history of other diseases of the digestive system: Secondary | ICD-10-CM

## 2013-07-07 DIAGNOSIS — C679 Malignant neoplasm of bladder, unspecified: Secondary | ICD-10-CM

## 2013-07-07 DIAGNOSIS — Z7982 Long term (current) use of aspirin: Secondary | ICD-10-CM

## 2013-07-07 DIAGNOSIS — Z803 Family history of malignant neoplasm of breast: Secondary | ICD-10-CM | POA: Diagnosis not present

## 2013-07-07 DIAGNOSIS — Z87891 Personal history of nicotine dependence: Secondary | ICD-10-CM

## 2013-07-07 DIAGNOSIS — R0989 Other specified symptoms and signs involving the circulatory and respiratory systems: Secondary | ICD-10-CM

## 2013-07-07 DIAGNOSIS — E1169 Type 2 diabetes mellitus with other specified complication: Secondary | ICD-10-CM | POA: Diagnosis present

## 2013-07-07 DIAGNOSIS — R0609 Other forms of dyspnea: Secondary | ICD-10-CM

## 2013-07-07 DIAGNOSIS — K449 Diaphragmatic hernia without obstruction or gangrene: Secondary | ICD-10-CM

## 2013-07-07 DIAGNOSIS — R42 Dizziness and giddiness: Secondary | ICD-10-CM

## 2013-07-07 DIAGNOSIS — C50111 Malignant neoplasm of central portion of right female breast: Secondary | ICD-10-CM | POA: Diagnosis present

## 2013-07-07 DIAGNOSIS — K5730 Diverticulosis of large intestine without perforation or abscess without bleeding: Secondary | ICD-10-CM

## 2013-07-07 DIAGNOSIS — B0229 Other postherpetic nervous system involvement: Secondary | ICD-10-CM

## 2013-07-07 DIAGNOSIS — E782 Mixed hyperlipidemia: Secondary | ICD-10-CM

## 2013-07-07 DIAGNOSIS — I5032 Chronic diastolic (congestive) heart failure: Secondary | ICD-10-CM

## 2013-07-07 DIAGNOSIS — I2089 Other forms of angina pectoris: Secondary | ICD-10-CM

## 2013-07-07 DIAGNOSIS — R7402 Elevation of levels of lactic acid dehydrogenase (LDH): Secondary | ICD-10-CM

## 2013-07-07 DIAGNOSIS — M2550 Pain in unspecified joint: Secondary | ICD-10-CM

## 2013-07-07 DIAGNOSIS — C50919 Malignant neoplasm of unspecified site of unspecified female breast: Secondary | ICD-10-CM

## 2013-07-07 LAB — I-STAT TROPONIN, ED: Troponin i, poc: 0.01 ng/mL (ref 0.00–0.08)

## 2013-07-07 LAB — BASIC METABOLIC PANEL
BUN: 14 mg/dL (ref 6–23)
CALCIUM: 9.7 mg/dL (ref 8.4–10.5)
CO2: 23 meq/L (ref 19–32)
Chloride: 104 mEq/L (ref 96–112)
Creatinine, Ser: 0.66 mg/dL (ref 0.50–1.10)
GFR calc non Af Amer: 86 mL/min — ABNORMAL LOW (ref >90)
Glucose, Bld: 131 mg/dL — ABNORMAL HIGH (ref 70–99)
Potassium: 4.1 mEq/L (ref 3.7–5.3)
SODIUM: 141 meq/L (ref 137–147)

## 2013-07-07 LAB — HEPATIC FUNCTION PANEL
ALK PHOS: 120 U/L — AB (ref 39–117)
ALT: 24 U/L (ref 0–35)
AST: 48 U/L — AB (ref 0–37)
Albumin: 3.6 g/dL (ref 3.5–5.2)
BILIRUBIN DIRECT: 0.2 mg/dL (ref 0.0–0.3)
BILIRUBIN INDIRECT: 0.6 mg/dL (ref 0.3–0.9)
BILIRUBIN TOTAL: 0.8 mg/dL (ref 0.3–1.2)
Total Protein: 8.2 g/dL (ref 6.0–8.3)

## 2013-07-07 LAB — CBC
HCT: 30.4 % — ABNORMAL LOW (ref 36.0–46.0)
Hemoglobin: 9.4 g/dL — ABNORMAL LOW (ref 12.0–15.0)
MCH: 28 pg (ref 26.0–34.0)
MCHC: 30.9 g/dL (ref 30.0–36.0)
MCV: 90.5 fL (ref 78.0–100.0)
PLATELETS: 198 10*3/uL (ref 150–400)
RBC: 3.36 MIL/uL — AB (ref 3.87–5.11)
RDW: 18.1 % — AB (ref 11.5–15.5)
WBC: 5.9 10*3/uL (ref 4.0–10.5)

## 2013-07-07 LAB — GLUCOSE, CAPILLARY: Glucose-Capillary: 94 mg/dL (ref 70–99)

## 2013-07-07 LAB — TROPONIN I
Troponin I: <0.3 ng/mL (ref <0.30)
Troponin I: <0.3 ng/mL (ref <0.30)

## 2013-07-07 LAB — PRO B NATRIURETIC PEPTIDE: PRO B NATRI PEPTIDE: 612.1 pg/mL — AB (ref 0–125)

## 2013-07-07 MED ORDER — INSULIN GLARGINE 100 UNIT/ML ~~LOC~~ SOLN
35.0000 [IU] | Freq: Every day | SUBCUTANEOUS | Status: DC
  Administered 2013-07-07 – 2013-07-09 (×3): 35 [IU] via SUBCUTANEOUS
  Filled 2013-07-07 (×4): qty 0.35

## 2013-07-07 MED ORDER — FUROSEMIDE 10 MG/ML IJ SOLN: 40.0000 mg | Freq: Two times a day (BID) | INTRAMUSCULAR | Status: DC

## 2013-07-07 MED ORDER — ENOXAPARIN SODIUM 40 MG/0.4ML ~~LOC~~ SOLN
40.0000 mg | SUBCUTANEOUS | Status: DC
  Administered 2013-07-07 – 2013-07-09 (×3): 40 mg via SUBCUTANEOUS
  Filled 2013-07-07 (×5): qty 0.4

## 2013-07-07 MED ORDER — PAROXETINE HCL 10 MG PO TABS
10.0000 mg | ORAL_TABLET | Freq: Every morning | ORAL | Status: DC
  Administered 2013-07-09 – 2013-07-10 (×2): 10 mg via ORAL
  Filled 2013-07-07 (×3): qty 1

## 2013-07-07 MED ORDER — FERROUS FUMARATE 325 (106 FE) MG PO TABS
1.0000 | ORAL_TABLET | Freq: Every day | ORAL | Status: DC
  Administered 2013-07-09 – 2013-07-10 (×2): 106 mg via ORAL
  Filled 2013-07-07 (×4): qty 1

## 2013-07-07 MED ORDER — HYDROCODONE-ACETAMINOPHEN 5-325 MG PO TABS: 1.0000 | ORAL_TABLET | Freq: Four times a day (QID) | ORAL | Status: DC | PRN

## 2013-07-07 MED ORDER — FOLIC ACID 1 MG PO TABS
1.0000 mg | ORAL_TABLET | Freq: Every day | ORAL | Status: DC
  Administered 2013-07-09 – 2013-07-10 (×2): 1 mg via ORAL
  Filled 2013-07-07 (×3): qty 1

## 2013-07-07 MED ORDER — FUROSEMIDE 10 MG/ML IJ SOLN
40.0000 mg | Freq: Two times a day (BID) | INTRAMUSCULAR | Status: DC
  Administered 2013-07-08 – 2013-07-09 (×3): 40 mg via INTRAVENOUS
  Filled 2013-07-07 (×6): qty 4

## 2013-07-07 MED ORDER — FERROUS FUM-IRON POLYSACCH-FA 162-115.2-1 MG PO CAPS: 1.0000 | ORAL_CAPSULE | Freq: Every morning | ORAL | Status: DC

## 2013-07-07 MED ORDER — ASPIRIN EC 81 MG PO TBEC
81.0000 mg | DELAYED_RELEASE_TABLET | Freq: Every day | ORAL | Status: DC
  Administered 2013-07-07 – 2013-07-09 (×3): 81 mg via ORAL
  Filled 2013-07-07 (×4): qty 1

## 2013-07-07 MED ORDER — ASPIRIN 81 MG PO CHEW
81.0000 mg | CHEWABLE_TABLET | Freq: Every day | ORAL | Status: DC
  Administered 2013-07-07: 81 mg via ORAL

## 2013-07-07 MED ORDER — INSULIN ASPART 100 UNIT/ML ~~LOC~~ SOLN
0.0000 [IU] | Freq: Every day | SUBCUTANEOUS | Status: DC
  Administered 2013-07-07 – 2013-07-08 (×2): 2 [IU] via SUBCUTANEOUS
  Administered 2013-07-09: 4 [IU] via SUBCUTANEOUS

## 2013-07-07 MED ORDER — SODIUM CHLORIDE 0.9 % IJ SOLN
3.0000 mL | Freq: Two times a day (BID) | INTRAMUSCULAR | Status: DC
  Administered 2013-07-07 – 2013-07-10 (×6): 3 mL via INTRAVENOUS

## 2013-07-07 MED ORDER — INSULIN ASPART 100 UNIT/ML ~~LOC~~ SOLN
0.0000 [IU] | Freq: Three times a day (TID) | SUBCUTANEOUS | Status: DC
  Administered 2013-07-08: 2 [IU] via SUBCUTANEOUS
  Administered 2013-07-09: 3 [IU] via SUBCUTANEOUS
  Administered 2013-07-09: 11 [IU] via SUBCUTANEOUS
  Administered 2013-07-09: 8 [IU] via SUBCUTANEOUS
  Administered 2013-07-10: 3 [IU] via SUBCUTANEOUS
  Administered 2013-07-10: 11 [IU] via SUBCUTANEOUS

## 2013-07-07 MED ORDER — FUROSEMIDE 10 MG/ML IJ SOLN
40.0000 mg | Freq: Once | INTRAMUSCULAR | Status: AC
  Administered 2013-07-07: 40 mg via INTRAVENOUS
  Filled 2013-07-07: qty 4

## 2013-07-07 MED ORDER — PANTOPRAZOLE SODIUM 40 MG PO TBEC
40.0000 mg | DELAYED_RELEASE_TABLET | Freq: Every day | ORAL | Status: DC
  Administered 2013-07-09 – 2013-07-10 (×2): 40 mg via ORAL
  Filled 2013-07-07 (×4): qty 1

## 2013-07-07 MED ORDER — ATORVASTATIN CALCIUM 10 MG PO TABS
10.0000 mg | ORAL_TABLET | Freq: Every day | ORAL | Status: DC
  Administered 2013-07-07 – 2013-07-09 (×3): 10 mg via ORAL
  Filled 2013-07-07 (×4): qty 1

## 2013-07-07 MED ORDER — INSULIN ASPART 100 UNIT/ML ~~LOC~~ SOLN
5.0000 [IU] | Freq: Three times a day (TID) | SUBCUTANEOUS | Status: DC
  Administered 2013-07-07 – 2013-07-10 (×7): 5 [IU] via SUBCUTANEOUS

## 2013-07-07 NOTE — ED Notes (Signed)
MD at bedside. 

## 2013-07-07 NOTE — H&P (Addendum)
History and Physical   [name] [name] [id number] DOB: Jan 11, 1941 DOA: 07/07/2013  Referring physician: Dr. Maryan Rued PCP: [name], MD  Specialists: none  Chief Complaint: DOE  HPI: [name] [name] is a 73 y.o. female has a past medical history significant for insulin-dependent diabetes, hypertension, hyperlipidemia, IBS, history of breast cancer bladder cancer and colon cancer, presents to the emergency room with a chief complaint of chest tightness and shortness of breath with exertion, progressively getting worse over the last few weeks. Patient now is barely able to walk inside her house. She first noticed these symptoms last December, when she saw her PCP Dr. Linna Darner. She is now following with Dr. Sarajane Jews. She recalls having symptoms like this for a very short period of time about 3 years ago. She denies chest pain per se but it's more of a tightness and inability to breathe. She denies any lightheadedness or dizziness. She has no orthopnea or PND. She has noticed lower extremity swelling and abdominal distention, more so in the last 2 days, and she was surprised to notice that she has gained 10 pounds in the last 3-4 days. She has no abdominal pain, nausea, vomiting or diarrhea. She denies any fever or chills. In the emergency room patient with stable vital signs, blood work remarkable for mild anemia (which is not new), elevated BNP 612. Troponin was negative. chest x-ray with possible edema  Review of Systems: as per HPI otherwise negative  Past Medical History  Diagnosis Date  . Transaminase or LDH elevation     sees Dr. Zenovia Jarred   . Hiatal hernia   . GERD (gastroesophageal reflux disease)   . Diverticulosis of colon (without mention of hemorrhage) 09/26/2004,09/10/2006  . Hypertension   . Hyperlipidemia   . Hyperglycemia   . IBS (irritable bowel syndrome)   . Allergy     environmental  . Sleep apnea     sees Dr. Gwenette Greet   . Hepatic steatosis   . Arthritis     sees Dr.  Hurley Cisco   . Adenocarcinoma, breast     sees Dr. Marcy Panning   . Bladder cancer   . Malignant neoplasm of colon, unspecified site 07/08/2003  . Diabetes mellitus     sees Dr. Chalmers Cater    Past Surgical History  Procedure Laterality Date  . Sigmoid colectomy 2005      for cancer  . Incisonal hernia    . Mastectomy and biopsy    . Bladder cancer 2001    . Squamous cell ca  r  calf  2008    . Carpal tunnel release    . Esophageal dilation    . Colonoscopy    . Colon surgery  1995    for diverticulitis   Social History:  reports that she quit smoking about 38 years ago. Her smoking use included Cigarettes. She has a 40 pack-year smoking history. She has never used smokeless tobacco. She reports that she does not drink alcohol or use illicit drugs.  No Known Allergies  Family History  Problem Relation Age of Onset  . Diabetes Mother   . Skin cancer Mother   . Lung cancer Father   . Lymphoma Sister   . Hypertension Sister   . Diabetes Sister   . Hypertension Sister   . Breast cancer Sister   . Diabetes Sister   . Hypertension Sister   . Inflammatory bowel disease Daughter   . Stroke Neg Hx    Prior to  Admission medications   Medication Sig Start Date End Date Taking? Authorizing Provider  aspirin EC 81 MG tablet Take 81 mg by mouth at bedtime.   Yes Historical Provider, MD  Calcium Carbonate-Vitamin D (CALCIUM + D PO) Take 1 tablet by mouth daily.   Yes Historical Provider, MD  diphenoxylate-atropine (LOMOTIL) 2.5-0.025 MG per tablet Take 1 tablet by mouth every morning.   Yes Historical Provider, MD  Ferrous Fum-Iron Polysacch-FA 162-115.2-1 MG CAPS Take 1 capsule by mouth every morning.   Yes Historical Provider, MD  fish oil-omega-3 fatty acids 1000 MG capsule Take 1 g by mouth daily.    Yes Historical Provider, MD  glucose blood test strip 1 each by Other route as needed. Use as instructed    Yes Historical Provider, MD  HYDROcodone-acetaminophen (NORCO/VICODIN)  5-325 MG per tablet Take 1 tablet by mouth every 6 (six) hours as needed (For pain.).  05/18/13  Yes Historical Provider, MD  Insulin Isophane Human (NOVOLIN N RELION [place]) Inject 65-75 Units into the skin 2 (two) times daily. She takes 65 units in the morning and 75 units at bedtime.   Yes Historical Provider, MD  metFORMIN (GLUCOPHAGE-XR) 500 MG 24 hr tablet Take 1,000 mg by mouth 2 (two) times daily. 04/14/13  Yes Historical Provider, MD  naproxen sodium (ALEVE) 220 MG tablet Take 440 mg by mouth 2 (two) times daily with a meal.   Yes Historical Provider, MD  NOVOLIN R RELION 100 UNIT/ML injection Inject 15 Units into the skin 3 (three) times daily before meals.  05/05/13  Yes Historical Provider, MD  omeprazole (PRILOSEC) 20 MG capsule Take 1 capsule (20 mg total) by mouth 2 (two) times daily. 05/15/13  Yes Sable Feil, MD  PARoxetine (PAXIL) 20 MG tablet Take 10 mg by mouth every morning.    Yes Historical Provider, MD  [place] 1ML/31G 31G X 5/16" 1 ML MISC  12/08/11  Yes Historical Provider, MD  rosuvastatin (CRESTOR) 10 MG tablet Take 5 mg by mouth at bedtime.   Yes Historical Provider, MD  traMADol (ULTRAM) 50 MG tablet Take 50 mg by mouth every 8 (eight) hours as needed (For pain.).  12/12/12  Yes Hendricks Limes, MD   Physical Exam: Filed Vitals:   07/07/13 1214 07/07/13 1357 07/07/13 1400  BP: 139/63 129/63 131/65  Pulse: 81 67 66  Temp: 98.4 F (36.9 C)    TempSrc: Oral    Resp: 16 18 17   SpO2: 96% 98% 96%     General:  No apparent distress  Eyes: PERRL, EOMI, no scleral icterus  ENT: moist oropharynx  Neck: supple, no JVD  Cardiovascular: Regular rate and rhythm, 3/6 SEM  Respiratory: CTA biL, good air movement without wheezing, rhonchi or crackles  Abdomen: Soft abdomen, mildly distended  Skin: no rashes  Musculoskeletal: 2+ pitting lower extremity edema  Psychiatric: normal mood and affect  Neurologic: Nonfocal  Labs on Admission:  Basic  Metabolic Panel:  Recent Labs Lab 07/07/13 1255  NA 141  K 4.1  CL 104  CO2 23  GLUCOSE 131*  BUN 14  CREATININE 0.66  CALCIUM 9.7   Liver Function Tests:  Recent Labs Lab 07/07/13 1540  AST 48*  ALT 24  ALKPHOS 120*  BILITOT 0.8  PROT 8.2  ALBUMIN 3.6   No results found for this basename: LIPASE, AMYLASE,  in the last 168 hours No results found for this basename: AMMONIA,  in the last 168 hours CBC:  Recent  Labs Lab 07/07/13 1255  WBC 5.9  HGB 9.4*  HCT 30.4*  MCV 90.5  PLT 198   Cardiac Enzymes:  Recent Labs Lab 07/07/13 1255 07/07/13 1540  TROPONINI <0.30 <0.30    BNP (last 3 results)  Recent Labs  07/07/13 1255  PROBNP 612.1*   Radiological Exams on Admission: Dg Chest 2 View  07/07/2013   CLINICAL DATA:  Shortness of breath.  Tightness in chest.  EXAM: CHEST  2 VIEW  COMPARISON:  04/18/2012  FINDINGS: Patient had a right mastectomy and there are surgical clips in the right axilla. There are new densities at lung bases, particularly on the right side. Coarse lung markings appear to be chronic. Heart size is upper limits of normal but stable. Degenerative changes throughout the thoracic spine.  IMPRESSION: Bibasilar densities, right side greater than right. Findings probably represent areas of atelectasis. Asymmetric dependent edema or an early infectious process cannot be excluded at the right lung base.  Right mastectomy.   Electronically Signed   By: Markus Daft M.D.   On: 07/07/2013 13:50   US Abdomen Limited  07/07/2013   CLINICAL DATA:  ABD DISTENTION EVAL FOR ASCITES  EXAM: LIMITED ABDOMEN ULTRASOUND FOR ASCITES  TECHNIQUE: Limited ultrasound survey for ascites was performed in all four abdominal quadrants.  COMPARISON:  None.  US ABDOMEN COMPLETE dated 06/06/2013  FINDINGS: No sonographic evidence of free fluid nor loculated fluid collections within the abdomen.  IMPRESSION: No sonographically appreciable evidence of ascites.   Electronically Signed    By: Margaree Mackintosh M.D.   On: 07/07/2013 16:39   EKG: Independently reviewed. Sinus rhythm   Assessment/Plan   Chest tightness/dyspnea on exertion /fluid overload - her symptoms are concerning for heart failure with lower extremity edema, JVD and mild bibasilar crackles. She has multiple risk factors for heart disease including insulin-dependent diabetes (will check hemoglobin A1c), hypertension, hyperlipidemia. Obtain 2-D echo to assess LV function. I feel that she would need formal cardiac evaluation, and I have consulted cardiology. Appreciate their input. Cycle troponin overnight. She has gained 10 pounds in the last 3 days, mainly seen in the lower extremities as well as abdominal distention. Obtain an abdominal ultrasound to evaluate for ascites. Obtain liver function tests. She has received 40 mg of IV Lasix. Continue Lasix 40 mg twice daily . Abdominal distention - obtain abdominal ultrasound Right arm swelling - this is chronic for her, exacerbated right now due to fluid overload. Diabetes mellitus - she is on high doses of insulin at home. Restart Lantus/Aspart as per orders. Add sliding scale insulin.  GERD - continue PPI Anemia - this is chronic, likely iron deficient. Continue her home iron.    Diet: heart healthy  Fluids: none  DVT Prophylaxis:  Code Status: Presumed full code  Family Communication: Husband at the bedside  Disposition Plan: Inpatient, telemetry  Time spent: 12  This note has been created with Surveyor, quantity. Any transcriptional errors are unintentional.   Damali Broadfoot M. Cruzita Lederer, MD [facility] Pager [phone]  If 7PM-7AM, please contact night-coverage [email or web address] Password [facility] 07/07/2013, 4:56 PM

## 2013-07-07 NOTE — ED Notes (Signed)
Patient getting ultrasound at bedside.

## 2013-07-07 NOTE — ED Provider Notes (Addendum)
CSN: [id number]     Arrival date & time 07/07/13  1208 History   First MD Initiated Contact with Patient 07/07/13 1306     Chief Complaint  Patient presents with  . Shortness of Breath     (Consider location/radiation/quality/duration/timing/severity/associated sxs/prior Treatment) Patient is a 73 y.o. female presenting with shortness of breath. The history is provided by the patient.  Shortness of Breath Severity:  Moderate Onset quality:  Gradual Duration:  3 weeks Timing:  Constant Progression:  Worsening Chronicity:  New Context: activity   Relieved by:  Rest and sitting up Worsened by:  Activity and exertion Ineffective treatments:  None tried Associated symptoms: chest pain   Associated symptoms: no abdominal pain, no cough, no diaphoresis, no vomiting and no wheezing   Associated symptoms comment:  Leg swelling Chest pain:    Quality:  Pressure (pain occurs when she walks or goes up steps and stops with rest)   Severity:  Moderate   Onset quality:  Gradual   Duration:  1 week   Timing:  Intermittent   Progression:  Resolved   Chronicity:  New Risk factors: no prolonged immobilization, no recent surgery and no tobacco use     Past Medical History  Diagnosis Date  . Transaminase or LDH elevation     sees Dr. Zenovia Jarred   . Hiatal hernia   . GERD (gastroesophageal reflux disease)   . Diverticulosis of colon (without mention of hemorrhage) 09/26/2004,09/10/2006  . Hypertension   . Hyperlipidemia   . Hyperglycemia   . IBS (irritable bowel syndrome)   . Allergy     environmental  . Sleep apnea     sees Dr. Gwenette Greet   . Hepatic steatosis   . Arthritis     sees Dr. Hurley Cisco   . Adenocarcinoma, breast     sees Dr. Marcy Panning   . Bladder cancer   . Malignant neoplasm of colon, unspecified site 07/08/2003  . Diabetes mellitus     sees Dr. Chalmers Cater    Past Surgical History  Procedure Laterality Date  . Sigmoid colectomy 2005      for cancer  . Incisonal  hernia    . Mastectomy and biopsy    . Bladder cancer 2001    . Squamous cell ca  r  calf  2008    . Carpal tunnel release    . Esophageal dilation    . Colonoscopy    . Colon surgery  1995    for diverticulitis   Family History  Problem Relation Age of Onset  . Diabetes Mother   . Skin cancer Mother   . Lung cancer Father   . Lymphoma Sister   . Hypertension Sister   . Diabetes Sister   . Hypertension Sister   . Breast cancer Sister   . Diabetes Sister   . Hypertension Sister   . Inflammatory bowel disease Daughter   . Stroke Neg Hx    History  Substance Use Topics  . Smoking status: Former Smoker -- 2.00 packs/day for 20 years    Types: Cigarettes    Quit date: 05/09/1975  . Smokeless tobacco: Never Used     Comment: smoked 1958-1977, up to 2 ppd  . Alcohol Use: No   OB History   Grav Para Term Preterm Abortions TAB SAB Ect Mult Living                 Review of Systems  Constitutional: Negative for diaphoresis.  Respiratory:  Positive for shortness of breath. Negative for cough and wheezing.   Cardiovascular: Positive for chest pain.  Gastrointestinal: Negative for vomiting and abdominal pain.  All other systems reviewed and are negative.      Allergies  Review of patient's allergies indicates no known allergies.  Home Medications   Current Outpatient Rx  Name  Route  Sig  Dispense  Refill  . aspirin EC 81 MG tablet   Oral   Take 81 mg by mouth at bedtime.         . Calcium Carbonate-Vitamin D (CALCIUM + D PO)   Oral   Take 1 tablet by mouth daily.         . diphenoxylate-atropine (LOMOTIL) 2.5-0.025 MG per tablet   Oral   Take 1 tablet by mouth every morning.         . Ferrous Fum-Iron Polysacch-FA 162-115.2-1 MG CAPS   Oral   Take 1 capsule by mouth every morning.         . fish oil-omega-3 fatty acids 1000 MG capsule   Oral   Take 1 g by mouth daily.          Marland Kitchen glucose blood test strip   Other   1 each by Other route as  needed. Use as instructed          . HYDROcodone-acetaminophen (NORCO/VICODIN) 5-325 MG per tablet   Oral   Take 1 tablet by mouth every 6 (six) hours as needed (For pain.).          . Insulin Isophane Human (NOVOLIN N RELION [place])   Subcutaneous   Inject 65-75 Units into the skin 2 (two) times daily. She takes 65 units in the morning and 75 units at bedtime.         . metFORMIN (GLUCOPHAGE-XR) 500 MG 24 hr tablet   Oral   Take 1,000 mg by mouth 2 (two) times daily.         . naproxen sodium (ALEVE) 220 MG tablet   Oral   Take 440 mg by mouth 2 (two) times daily with a meal.         . NOVOLIN R RELION 100 UNIT/ML injection   Subcutaneous   Inject 15 Units into the skin 3 (three) times daily before meals.          Marland Kitchen omeprazole (PRILOSEC) 20 MG capsule   Oral   Take 1 capsule (20 mg total) by mouth 2 (two) times daily.   60 capsule   0     PLEASE KEEP APPOINTMENT FOR FURTHER REFILLS   . PARoxetine (PAXIL) 20 MG tablet   Oral   Take 10 mg by mouth every morning.          Marland Kitchen RELION INSULIN [place]YRINGE 1ML/31G 31G X 5/16" 1 ML MISC               . rosuvastatin (CRESTOR) 10 MG tablet   Oral   Take 5 mg by mouth at bedtime.         . traMADol (ULTRAM) 50 MG tablet   Oral   Take 50 mg by mouth every 8 (eight) hours as needed (For pain.).           BP 139/63  Pulse 81  Temp(Src) 98.4 F (36.9 C) (Oral)  Resp 16  SpO2 96% Physical Exam  Nursing note and vitals reviewed. Constitutional: She is oriented to person, place, and time. She appears well-developed and well-nourished. No distress.  HENT:  Head: Normocephalic and atraumatic.  Mouth/Throat: Oropharynx is clear and moist.  Eyes: Conjunctivae and EOM are normal. Pupils are equal, round, and reactive to light.  Neck: Normal range of motion. Neck supple.  Cardiovascular: Normal rate, regular rhythm and intact distal pulses.   Murmur heard.  Systolic murmur is present with a grade of 3/6   Pulmonary/Chest: Effort normal. No respiratory distress. She has no wheezes. She has rales in the right lower field and the left lower field.  Abdominal: Soft. She exhibits distension. There is no tenderness. There is no rebound and no guarding.  Musculoskeletal: Normal range of motion. She exhibits edema. She exhibits no tenderness.  1+ bilateral lower ext edema  Neurological: She is alert and oriented to person, place, and time.  Skin: Skin is warm and dry. No rash noted. No erythema.  Psychiatric: She has a normal mood and affect. Her behavior is normal.    ED Course  Procedures (including critical care time) Labs Review Labs Reviewed  CBC - Abnormal; Notable for the following:    RBC 3.36 (*)    Hemoglobin 9.4 (*)    HCT 30.4 (*)    RDW 18.1 (*)    All other components within normal limits  BASIC METABOLIC PANEL - Abnormal; Notable for the following:    Glucose, Bld 131 (*)    GFR calc non Af Amer 86 (*)    All other components within normal limits  PRO B NATRIURETIC PEPTIDE - Abnormal; Notable for the following:    Pro B Natriuretic peptide (BNP) 612.1 (*)    All other components within normal limits  I-STAT TROPOININ, ED   Imaging Review Dg Chest 2 View  07/07/2013   CLINICAL DATA:  Shortness of breath.  Tightness in chest.  EXAM: CHEST  2 VIEW  COMPARISON:  04/18/2012  FINDINGS: Patient had a right mastectomy and there are surgical clips in the right axilla. There are new densities at lung bases, particularly on the right side. Coarse lung markings appear to be chronic. Heart size is upper limits of normal but stable. Degenerative changes throughout the thoracic spine.  IMPRESSION: Bibasilar densities, right side greater than right. Findings probably represent areas of atelectasis. Asymmetric dependent edema or an early infectious process cannot be excluded at the right lung base.  Right mastectomy.   Electronically Signed   By: Markus Daft M.D.   On: 07/07/2013 13:50     EKG  Interpretation   Date/Time:  Monday July 07 2013 12:18:32 EST Ventricular Rate:  74 PR Interval:  157 QRS Duration: 88 QT Interval:  435 QTC Calculation: 483 R Axis:     Text Interpretation:  Sinus rhythm new Borderline T abnormalities,  inferior leads Confirmed by Maryan Rued  MD, Loree Fee ([id number]) on 07/07/2013  1:19:22 PM      MDM   Final diagnoses:  CHF (congestive heart failure)    Patient is here complaining of 2-3 weeks of shortness of breath that is worse with activity. She also gets what sounds like stable angina with exertion over the last 3-4 days. She's had a 10 pound weight gain . She denies any infectious symptoms but has noticed intermittent extremity and abdominal swelling. On exam patient has a systolic murmur and distal edema with rales. Feel likely patient has CHF low suspicion for anemia, infection.    EKG with new t-wave changes.  Troponin neg.  CBC, BMP wnl.  BNP elevated at 612 without prior hx of CHF.  CXR  with fluid.  Will start lasix and admit for further care.    Blanchie Dessert, MD 07/07/13 [place], MD 07/07/13 1429

## 2013-07-07 NOTE — ED Notes (Signed)
Report called to 4th floor. 

## 2013-07-07 NOTE — ED Notes (Signed)
Patient reports no SOB at rest, but with exertion.

## 2013-07-07 NOTE — Consult Note (Signed)
Reason for Consult: Exertional dyspnea nad chest pain  Requesting Physician: Mary Sella  HPI: This is a 73 y.o. female with a past medical history significant for DM, HTN, dyslipidemia, and sleep apnea. She has no prior history of CAD, MI, or cardiac work up. She is in the ER at [facility] today at the urging of her husband. The pt has had several weeks of exertional dyspnea. In the last 2-3 weeks this has progressed. She now also complains of exertional chest "tightness". She denies any rest symptoms. She denies any associated radiation to her arms or jaw, no orthopnea or PND, no associated nausea or diaphoresis.    PMHx:  Past Medical History  Diagnosis Date  . Transaminase or LDH elevation     sees Dr. Zenovia Jarred   . Hiatal hernia   . GERD (gastroesophageal reflux disease)   . Diverticulosis of colon (without mention of hemorrhage) 09/26/2004,09/10/2006  . Hypertension   . Hyperlipidemia   . Hyperglycemia   . IBS (irritable bowel syndrome)   . Allergy     environmental  . Sleep apnea     sees Dr. Gwenette Greet   . Hepatic steatosis   . Arthritis     sees Dr. Hurley Cisco   . Adenocarcinoma, breast     sees Dr. Marcy Panning   . Bladder cancer   . Malignant neoplasm of colon, unspecified site 07/08/2003  . Diabetes mellitus     sees Dr. Chalmers Cater    Past Surgical History  Procedure Laterality Date  . Sigmoid colectomy 2005      for cancer  . Incisonal hernia    . Mastectomy and biopsy    . Bladder cancer 2001    . Squamous cell ca  r  calf  2008    . Carpal tunnel release    . Esophageal dilation    . Colonoscopy    . Colon surgery  1995    for diverticulitis    FAMHx: Remarkable for HTN, DM   SOCHx:  reports that she quit smoking about 38 years ago. Her smoking use included Cigarettes. She has a 40 pack-year smoking history. She has never used smokeless tobacco. She reports that she does not drink alcohol or use illicit drugs.  ALLERGIES: No Known Allergies  ROS: A  comprehensive review of systems was negative. Except where noted above.   HOME MEDICATIONS: See Med Rec  HOSPITAL MEDICATIONS: I have reviewed the patient's current medications.  VITALS: Blood pressure 131/65, pulse 66, temperature 98.4 F (36.9 C), temperature source Oral, resp. rate 17, SpO2 96.00%.  PHYSICAL EXAM: General appearance: alert, cooperative, no distress and moderately obese Neck: no carotid bruit and no JVD Lungs: clear to auscultation bilaterally Heart: regular rate and rhythm and soft systolic murmur Aov area Abdomen: obese Extremities: trace edema Pulses: 2+ and symmetric Skin: Skin color, texture, turgor normal. No rashes or lesions Neurologic: Grossly normal  LABS: Results for orders placed during the hospital encounter of 07/07/13 (from the past 48 hour(s))  CBC     Status: Abnormal   Collection Time    07/07/13 12:55 PM      Result Value Ref Range   WBC 5.9  4.0 - 10.5 K/uL   RBC 3.36 (*) 3.87 - 5.11 MIL/uL   Hemoglobin 9.4 (*) 12.0 - 15.0 g/dL   HCT 30.4 (*) 36.0 - 46.0 %   MCV 90.5  78.0 - 100.0 fL   MCH 28.0  26.0 - 34.0 pg   MCHC  30.9  30.0 - 36.0 g/dL   RDW 18.1 (*) 11.5 - 15.5 %   Platelets 198  150 - 400 K/uL  BASIC METABOLIC PANEL     Status: Abnormal   Collection Time    07/07/13 12:55 PM      Result Value Ref Range   Sodium 141  137 - 147 mEq/L   Potassium 4.1  3.7 - 5.3 mEq/L   Chloride 104  96 - 112 mEq/L   CO2 23  19 - 32 mEq/L   Glucose, Bld 131 (*) 70 - 99 mg/dL   BUN 14  6 - 23 mg/dL   Creatinine, Ser 0.66  0.50 - 1.10 mg/dL   Calcium 9.7  8.4 - 10.5 mg/dL   GFR calc non Af Amer 86 (*) >90 mL/min   GFR calc Af Amer >90  >90 mL/min   Comment: (NOTE)     The eGFR has been calculated using the CKD EPI equation.     This calculation has not been validated in all clinical situations.     eGFR's persistently <90 mL/min signify possible Chronic Kidney     Disease.  PRO B NATRIURETIC PEPTIDE     Status: Abnormal   Collection  Time    07/07/13 12:55 PM      Result Value Ref Range   Pro B Natriuretic peptide (BNP) 612.1 (*) 0 - 125 pg/mL  TROPONIN I     Status: None   Collection Time    07/07/13 12:55 PM      Result Value Ref Range   Troponin I <0.30  <0.30 ng/mL   Comment:            Due to the release kinetics of cTnI,     a negative result within the first hours     of the onset of symptoms does not rule out     myocardial infarction with certainty.     If myocardial infarction is still suspected,     repeat the test at appropriate intervals.  Randolm Idol, ED     Status: None   Collection Time    07/07/13  1:00 PM      Result Value Ref Range   Troponin i, poc 0.01  0.00 - 0.08 ng/mL   Comment 3            Comment: Due to the release kinetics of cTnI,     a negative result within the first hours     of the onset of symptoms does not rule out     myocardial infarction with certainty.     If myocardial infarction is still suspected,     repeat the test at appropriate intervals.  TROPONIN I     Status: None   Collection Time    07/07/13  3:40 PM      Result Value Ref Range   Troponin I <0.30  <0.30 ng/mL   Comment:            Due to the release kinetics of cTnI,     a negative result within the first hours     of the onset of symptoms does not rule out     myocardial infarction with certainty.     If myocardial infarction is still suspected,     repeat the test at appropriate intervals.  HEPATIC FUNCTION PANEL     Status: Abnormal (Preliminary result)   Collection Time    07/07/13  3:40 PM  Result Value Ref Range   Total Protein 8.2  6.0 - 8.3 g/dL   Albumin 3.6  3.5 - 5.2 g/dL   AST 48 (*) 0 - 37 U/L   ALT 24  0 - 35 U/L   Alkaline Phosphatase 120 (*) 39 - 117 U/L   Total Bilirubin 0.8  0.3 - 1.2 mg/dL   Bilirubin, Direct PENDING  0.0 - 0.3 mg/dL   Indirect Bilirubin PENDING  0.3 - 0.9 mg/dL    EKG: NSR no acute changes  IMAGING: Dg Chest 2 View  07/07/2013   CLINICAL DATA:   Shortness of breath.  Tightness in chest.  EXAM: CHEST  2 VIEW  COMPARISON:  04/18/2012  FINDINGS: Patient had a right mastectomy and there are surgical clips in the right axilla. There are new densities at lung bases, particularly on the right side. Coarse lung markings appear to be chronic. Heart size is upper limits of normal but stable. Degenerative changes throughout the thoracic spine.  IMPRESSION: Bibasilar densities, right side greater than right. Findings probably represent areas of atelectasis. Asymmetric dependent edema or an early infectious process cannot be excluded at the right lung base.  Right mastectomy.   Electronically Signed   By: Markus Daft M.D.   On: 07/07/2013 13:50    IMPRESSION: Principal Problem:   Dyspnea on exertion Active Problems:   Exertional angina   DIABETES MELLITUS, TYPE II, UNCONTROLLED   HYPERTENSION, ESSENTIAL NOS   OSA (obstructive sleep apnea)- C-pap   ADENOCARCINOMA, BREAST, RIGHT   BLADDER CANCER , UNSPEC.   HYPERLIPIDEMIA   COLON CANCER, HX OF   RECOMMENDATION: MD to see. She needs Echo and Nuc vs cath.   Time Spent Directly with Patient: 45 minutes  Erlene Quan [phone] beeper 07/07/2013, 4:31 PM   Personally seen and examined. Agree with above.   Symptoms have been progressively getting worse over the last several weeks. I reviewed a note from December where consideration of cardiology consult was noted. We will go ahead and proceed with ischemic evaluation with nuclear stress test. We will also check an echocardiogram to evaluate ejection fraction. She has gained weight recently.  BNP is also elevated. I agree with Lasix IV twice a day for now. We will also check a right upper extremity ultrasound to ensure that she does not have any evidence of deep venous thrombosis. She does have a history of lymphedema however she states that this has significantly worsened recently.  After studies have been performed, we will tailor her medical  therapy, i.e. beta blocker, ACE inhibitor, diuretic.   Candee Furbish, MD

## 2013-07-07 NOTE — ED Notes (Signed)
Pt reports shortness of breath x 2 weeks that is getting progressively worse since last night. Pt sts it gets worse with activity. Pt also reports chest tightness.

## 2013-07-08 ENCOUNTER — Other Ambulatory Visit (HOSPITAL_COMMUNITY): Payer: Medicare Other

## 2013-07-08 ENCOUNTER — Ambulatory Visit (HOSPITAL_COMMUNITY)
Admit: 2013-07-08 | Discharge: 2013-07-08 | Disposition: A | Discharge status: Home / Self Care | Payer: Medicare Other | Attending: Cardiology | Admitting: Cardiology

## 2013-07-08 ENCOUNTER — Telehealth: Payer: Self-pay | Admitting: Family Medicine

## 2013-07-08 ENCOUNTER — Other Ambulatory Visit: Payer: Self-pay

## 2013-07-08 ENCOUNTER — Inpatient Hospital Stay (HOSPITAL_COMMUNITY)
Admit: 2013-07-08 | Discharge: 2013-07-08 | Disposition: A | Discharge status: Home / Self Care | Payer: Medicare Other | Attending: Cardiology | Admitting: Cardiology

## 2013-07-08 DIAGNOSIS — I059 Rheumatic mitral valve disease, unspecified: Secondary | ICD-10-CM

## 2013-07-08 DIAGNOSIS — R079 Chest pain, unspecified: Secondary | ICD-10-CM | POA: Diagnosis not present

## 2013-07-08 LAB — CBC
HEMATOCRIT: 29.1 % — AB (ref 36.0–46.0)
HEMOGLOBIN: 9.1 g/dL — AB (ref 12.0–15.0)
MCH: 27.9 pg (ref 26.0–34.0)
MCHC: 31.3 g/dL (ref 30.0–36.0)
MCV: 89.3 fL (ref 78.0–100.0)
Platelets: 179 10*3/uL (ref 150–400)
RBC: 3.26 MIL/uL — ABNORMAL LOW (ref 3.87–5.11)
RDW: 18.2 % — ABNORMAL HIGH (ref 11.5–15.5)
WBC: 4.8 10*3/uL (ref 4.0–10.5)

## 2013-07-08 LAB — COMPREHENSIVE METABOLIC PANEL
ALK PHOS: 100 U/L (ref 39–117)
ALT: 21 U/L (ref 0–35)
AST: 39 U/L — AB (ref 0–37)
Albumin: 2.9 g/dL — ABNORMAL LOW (ref 3.5–5.2)
BILIRUBIN TOTAL: 0.7 mg/dL (ref 0.3–1.2)
BUN: 13 mg/dL (ref 6–23)
CO2: 26 mEq/L (ref 19–32)
Calcium: 9.8 mg/dL (ref 8.4–10.5)
Chloride: 102 mEq/L (ref 96–112)
Creatinine, Ser: 0.77 mg/dL (ref 0.50–1.10)
GFR calc Af Amer: >90 mL/min (ref >90)
GFR calc non Af Amer: 82 mL/min — ABNORMAL LOW (ref >90)
Glucose, Bld: 130 mg/dL — ABNORMAL HIGH (ref 70–99)
Potassium: 3.9 mEq/L (ref 3.7–5.3)
Sodium: 139 mEq/L (ref 137–147)
Total Protein: 6.4 g/dL (ref 6.0–8.3)

## 2013-07-08 LAB — PHOSPHORUS: Phosphorus: 5.1 mg/dL — ABNORMAL HIGH (ref 2.3–4.6)

## 2013-07-08 LAB — LIPID PANEL
Cholesterol: 132 mg/dL (ref 0–200)
HDL: 36 mg/dL — ABNORMAL LOW (ref >39)
LDL Cholesterol: 76 mg/dL (ref 0–99)
Total CHOL/HDL Ratio: 3.7 RATIO
Triglycerides: 98 mg/dL (ref <150)
VLDL: 20 mg/dL (ref 0–40)

## 2013-07-08 LAB — HEMOGLOBIN A1C
Hgb A1c MFr Bld: 8 % — ABNORMAL HIGH (ref <5.7)
MEAN PLASMA GLUCOSE: 183 mg/dL — AB (ref <117)

## 2013-07-08 LAB — TROPONIN I

## 2013-07-08 LAB — MAGNESIUM: Magnesium: 1.6 mg/dL (ref 1.5–2.5)

## 2013-07-08 MED ORDER — DIPHENOXYLATE-ATROPINE 2.5-0.025 MG PO TABS
1.0000 | ORAL_TABLET | Freq: Two times a day (BID) | ORAL | Status: DC | PRN
  Administered 2013-07-08: 1 via ORAL
  Filled 2013-07-08: qty 1

## 2013-07-08 MED ORDER — REGADENOSON 0.4 MG/5ML IV SOLN
INTRAVENOUS | Status: AC
  Administered 2013-07-08: 0.4 mg via INTRAVENOUS
  Filled 2013-07-08: qty 5

## 2013-07-08 MED ORDER — REGADENOSON 0.4 MG/5ML IV SOLN
0.4000 mg | Freq: Once | INTRAVENOUS | Status: AC
  Administered 2013-07-08: 0.4 mg via INTRAVENOUS

## 2013-07-08 MED ORDER — TECHNETIUM TC 99M SESTAMIBI GENERIC - CARDIOLITE
30.0000 | Freq: Once | INTRAVENOUS | Status: AC | PRN
  Administered 2013-07-08: 30 via INTRAVENOUS

## 2013-07-08 MED ORDER — TECHNETIUM TC 99M SESTAMIBI GENERIC - CARDIOLITE
10.0000 | Freq: Once | INTRAVENOUS | Status: AC | PRN
  Administered 2013-07-08: 10 via INTRAVENOUS

## 2013-07-08 NOTE — Progress Notes (Signed)
Echocardiogram 2D Echocardiogram has been performed.  Joelene Millin 07/08/2013, 10:02 AM

## 2013-07-08 NOTE — Progress Notes (Signed)
[facility] PROGRESS NOTE  [name] [name] IOE:[id number] DOB: 21-Feb-1941 DOA: 07/07/2013 PCP: [name], MD  Assessment/Plan: Chest Pain -For nuclear stress test today; further recommendations to follow.  CHF, type unknown -ECHO pending, but likely diastolic -Continue lasix. -3.2 L negative since admission.  DM -Well controlled.  GERD -Continue PPI  H/o Colon Cancer  Code Status: Full Code Family Communication: Patient only  Disposition Plan: Home when ready   Consultants:  Cardiology   Antibiotics:  None   Subjective: No complaints  Objective: Filed Vitals:   07/07/13 1700 07/07/13 2158 07/08/13 0610 07/08/13 1555  BP: 144/70 144/64 155/75 125/60  Pulse: 73 79 84 72  Temp: 98.3 F (36.8 C) 98.2 F (36.8 C) 98 F (36.7 C) 98 F (36.7 C)  TempSrc: Oral Oral Oral Oral  Resp: 20 20 20 18   Height: 5\' 5"  (1.651 m)     Weight: 88.4 kg (194 lb 14.2 oz)  86.682 kg (191 lb 1.6 oz)   SpO2: 100% 100% 97% 98%    Intake/Output Summary (Last 24 hours) at 07/08/13 1729 Last data filed at 07/08/13 1001  Gross per 24 hour  Intake      0 ml  Output   3225 ml  Net  -3225 ml   Filed Weights   07/07/13 1700 07/08/13 0610  Weight: 88.4 kg (194 lb 14.2 oz) 86.682 kg (191 lb 1.6 oz)    Exam:   General:  AA Ox3  Cardiovascular: RRR, +SEM  Respiratory: CTA B  Abdomen: S/NT/+BS  Extremities: 1-2+ edema    Neurologic:  Non-focal  Data Reviewed: Basic Metabolic Panel:  Recent Labs Lab 07/07/13 1255 07/08/13 0257  NA 141 139  K 4.1 3.9  CL 104 102  CO2 23 26  GLUCOSE 131* 130*  BUN 14 13  CREATININE 0.66 0.77  CALCIUM 9.7 9.8  MG  --  1.6  PHOS  --  5.1*   Liver Function Tests:  Recent Labs Lab 07/07/13 1540 07/08/13 0257  AST 48* 39*  ALT 24 21  ALKPHOS 120* 100  BILITOT 0.8 0.7  PROT 8.2 6.4  ALBUMIN 3.6 2.9*   No results found for this basename: LIPASE, AMYLASE,  in the last 168 hours No results found for this basename:  AMMONIA,  in the last 168 hours CBC:  Recent Labs Lab 07/07/13 1255 07/08/13 0257  WBC 5.9 4.8  HGB 9.4* 9.1*  HCT 30.4* 29.1*  MCV 90.5 89.3  PLT 198 179   Cardiac Enzymes:  Recent Labs Lab 07/07/13 1255 07/07/13 1540 07/08/13 0257  TROPONINI <0.30 <0.30 <0.30   BNP (last 3 results)  Recent Labs  07/07/13 1255  PROBNP 612.1*   CBG:  Recent Labs Lab 07/07/13 1656  GLUCAP 94    No results found for this or any previous visit (from the past 240 hour(s)).   Studies: Dg Chest 2 View  07/07/2013   CLINICAL DATA:  Shortness of breath.  Tightness in chest.  EXAM: CHEST  2 VIEW  COMPARISON:  04/18/2012  FINDINGS: Patient had a right mastectomy and there are surgical clips in the right axilla. There are new densities at lung bases, particularly on the right side. Coarse lung markings appear to be chronic. Heart size is upper limits of normal but stable. Degenerative changes throughout the thoracic spine.  IMPRESSION: Bibasilar densities, right side greater than right. Findings probably represent areas of atelectasis. Asymmetric dependent edema or an early infectious process cannot be excluded at the right lung  base.  Right mastectomy.   Electronically Signed   By: Markus Daft M.D.   On: 07/07/2013 13:50   Nm Myocar Multi W/spect W/wall Motion / Ef  07/08/2013   CLINICAL DATA:  Chest pain  EXAM: NUCLEAR MEDICINE MYOCARDIAL PERFUSION IMAGING  NUCLEAR MEDICINE LEFT VENTRICULAR WALL MOTION ANALYSIS  NUCLEAR MEDICINE LEFT VENTRICULAR EJECTION FRACTION CALCULATION  TECHNIQUE: Standard single day myocardial SPECT imaging was performed after resting intravenous injection of Tc-66m sestamibi. After intravenous infusion of Lexiscan (regadenoson) under supervision of cardiology staff, sestamibiwas injected intravenously and standard myocardial SPECT imaging was performed. Quantitative gated imaging was also performed to evaluate left ventricular wall motion and estimate left ventricular ejection  fraction.  Radiopharmaceutical: 10+30 mCi Tc57m sestamibiIV.  COMPARISON:  None  FINDINGS: The stress SPECT images demonstrate physiologic distribution of radiopharmaceutical. Rest images demonstrate no perfusion defects. The gated stress SPECT images demonstrate normal left ventricular myocardial thickening. No focal wall motion abnormality is seen. Calculated left ventricular end-diastolic volume 26JF, end-systolic volume 35KT, ejection fraction of 45%.  IMPRESSION: 1. Negative for pharmacologic-stress induced ischemia. 2. Left ventricular ejection fraction 45%.   Electronically Signed   By: Arne Cleveland M.D.   On: 07/08/2013 15:33   US Abdomen Limited  07/07/2013   CLINICAL DATA:  ABD DISTENTION EVAL FOR ASCITES  EXAM: LIMITED ABDOMEN ULTRASOUND FOR ASCITES  TECHNIQUE: Limited ultrasound survey for ascites was performed in all four abdominal quadrants.  COMPARISON:  None.  US ABDOMEN COMPLETE dated 06/06/2013  FINDINGS: No sonographic evidence of free fluid nor loculated fluid collections within the abdomen.  IMPRESSION: No sonographically appreciable evidence of ascites.   Electronically Signed   By: Margaree Mackintosh M.D.   On: 07/07/2013 16:39    Scheduled Meds: . aspirin EC  81 mg Oral QHS  . atorvastatin  10 mg Oral q1800  . enoxaparin (LOVENOX) injection  40 mg Subcutaneous Q24H  . ferrous fumarate  1 tablet Oral Daily   And  . folic acid  1 mg Oral Daily  . furosemide  40 mg Intravenous Q12H  . insulin aspart  0-15 Units Subcutaneous TID WC  . insulin aspart  0-5 Units Subcutaneous QHS  . insulin aspart  5 Units Subcutaneous TID WC  . insulin glargine  35 Units Subcutaneous QHS  . pantoprazole  40 mg Oral QAC breakfast  . PARoxetine  10 mg Oral q morning - 10a  . sodium chloride  3 mL Intravenous Q12H   Continuous Infusions:   Principal Problem:   Dyspnea on exertion Active Problems:   ADENOCARCINOMA, BREAST, RIGHT   BLADDER CANCER , UNSPEC.   DIABETES MELLITUS, TYPE II,  UNCONTROLLED   HYPERLIPIDEMIA   HYPERTENSION, ESSENTIAL NOS   COLON CANCER, HX OF   OSA (obstructive sleep apnea)- C-pap   Exertional angina   CHF (congestive heart failure)    Time spent: 35 minutes. Greater than 50% of this time was spent in direct contact with the patient coordinating care.    Lelon Frohlich  [facility] Pager [phone]  If 7PM-7AM, please contact night-coverage at [email or web address], password [facility] 07/08/2013, 5:29 PM  LOS: 1 day

## 2013-07-08 NOTE — Telephone Encounter (Signed)
Pt was on schedule to see Dr. Sarajane Jews on 07/07/13. I called pt and she was complaining of shortness of breath and not feeling good at all, also some swelling in legs. Per Dr. Sarajane Jews, pt should go to the ER. I spoke with pt and family member will take pt to the ER ASAP.

## 2013-07-08 NOTE — Care Management Note (Addendum)
    Page 1 of 1   07/10/2013     1:36:07 PM   CARE MANAGEMENT NOTE 07/10/2013  Patient:  NANSI, [place]   Account Number:  [id number]  Date Initiated:  07/08/2013  Documentation initiated by:  [facility]  Subjective/Objective Assessment:   73 Y/O F ADMITTED W/DOE.HX:DM,HTN,BREAST CA,COLON CA.     Action/Plan:   FROM HOME W/SPOUSE.HAS PCP,PHARMACY.   Anticipated DC Date:  07/10/2013   Anticipated DC Plan:  [place]  CM consult      Choice offered to / List presented to:             Status of service:  Completed, signed off Medicare Important Message given?   (If response is "NO", the following Medicare IM given date fields will be blank) Date Medicare IM given:   Date Additional Medicare IM given:    Discharge Disposition:  HOME/SELF CARE  Per UR Regulation:  Reviewed for med. necessity/level of care/duration of stay  If discussed at [place] of Stay Meetings, dates discussed:    Comments:  07/10/13 Saleemah Mollenhauer RN,BSN  NCM [phone] D/C HOME NO NEEDS OR ORDERS.  07/08/13 Garvin Ellena RN,BSN NCM [phone]

## 2013-07-08 NOTE — Progress Notes (Signed)
For testing today with further recommendations pending pending findings.

## 2013-07-09 ENCOUNTER — Ambulatory Visit: Payer: Medicare Other | Admitting: Internal Medicine

## 2013-07-09 DIAGNOSIS — I5033 Acute on chronic diastolic (congestive) heart failure: Principal | ICD-10-CM

## 2013-07-09 LAB — BASIC METABOLIC PANEL
BUN: 15 mg/dL (ref 6–23)
CO2: 28 meq/L (ref 19–32)
CREATININE: 0.72 mg/dL (ref 0.50–1.10)
Calcium: 9.5 mg/dL (ref 8.4–10.5)
Chloride: 98 mEq/L (ref 96–112)
GFR calc Af Amer: >90 mL/min (ref >90)
GFR calc non Af Amer: 84 mL/min — ABNORMAL LOW (ref >90)
GLUCOSE: 199 mg/dL — AB (ref 70–99)
Potassium: 3.6 mEq/L — ABNORMAL LOW (ref 3.7–5.3)
Sodium: 139 mEq/L (ref 137–147)

## 2013-07-09 LAB — CBC
HCT: 32.2 % — ABNORMAL LOW (ref 36.0–46.0)
Hemoglobin: 9.8 g/dL — ABNORMAL LOW (ref 12.0–15.0)
MCH: 27.4 pg (ref 26.0–34.0)
MCHC: 30.4 g/dL (ref 30.0–36.0)
MCV: 89.9 fL (ref 78.0–100.0)
Platelets: 196 10*3/uL (ref 150–400)
RBC: 3.58 MIL/uL — ABNORMAL LOW (ref 3.87–5.11)
RDW: 18.1 % — AB (ref 11.5–15.5)
WBC: 5.2 10*3/uL (ref 4.0–10.5)

## 2013-07-09 MED ORDER — FUROSEMIDE 40 MG PO TABS
40.0000 mg | ORAL_TABLET | Freq: Every day | ORAL | Status: DC
  Administered 2013-07-09 – 2013-07-10 (×2): 40 mg via ORAL
  Filled 2013-07-09 (×2): qty 1

## 2013-07-09 MED ORDER — METOPROLOL TARTRATE 25 MG PO TABS
25.0000 mg | ORAL_TABLET | Freq: Two times a day (BID) | ORAL | Status: DC
  Administered 2013-07-09 – 2013-07-10 (×3): 25 mg via ORAL
  Filled 2013-07-09 (×4): qty 1

## 2013-07-09 NOTE — Clinical Documentation Improvement (Signed)
  Chart states "exertional angina" in multiple Notes. In the Coding world this equates to "stable angina". Please clarify the term "exertional angina" based on your clinical judgement. Thank you.  Possible Clinical Conditions? - Unstable angina   - Stable angina  - Other Condition (please specify)  Thank You, [name] [name] ,RN Clinical Documentation Specialist:  [phone]  [place] Information Management

## 2013-07-09 NOTE — Progress Notes (Signed)
Right upper extremity venous duplex:  No evidence of DVT or superficial thrombosis.    

## 2013-07-09 NOTE — Progress Notes (Signed)
[facility] PROGRESS NOTE  [name] [name] WUJ:[id number] DOB: 1941/03/02 DOA: 07/07/2013 PCP: [name], MD  Assessment/Plan: Chest Pain  -For nuclear stress test today; further recommendations to follow.   CHF, type unknown  -ECHO reviewed and showing diastolic dysfunction -Lasix has been switched to oral form -Patient has lost > 6 L  DM  -Well controlled.   GERD  -Continue PPI   H/o Colon Cancer - Pt to continue routine follow up after discharge.  Code Status: full Family Communication: Discussed with patient and spouse at bedside Disposition Plan: Pending further evaluation and recommendations from cardiologist.   Consultants:  Cardiology  Procedures:  Echocardiogram  Antibiotics:  None  HPI/Subjective: No new complaints, no acute issues overnight.  Objective: Filed Vitals:   07/09/13 0605  BP: 132/63  Pulse: 83  Temp: 98 F (36.7 C)  Resp: 16    Intake/Output Summary (Last 24 hours) at 07/09/13 1403 Last data filed at 07/09/13 1240  Gross per 24 hour  Intake    920 ml  Output   4000 ml  Net  -3080 ml   Filed Weights   07/07/13 1700 07/08/13 0610 07/09/13 0500  Weight: 88.4 kg (194 lb 14.2 oz) 86.682 kg (191 lb 1.6 oz) 84.142 kg (185 lb 8 oz)    Exam:   General:  Pt in NAD, alert and awake  Cardiovascular: Normal s1 and s2, no MRG  Respiratory: CTA BL, no wheezes  Abdomen: soft, NT, ND  Musculoskeletal: no cyanosis or clubbing.   Data Reviewed: Basic Metabolic Panel:  Recent Labs Lab 07/07/13 1255 07/08/13 0257 07/09/13 0520  NA 141 139 139  K 4.1 3.9 3.6*  CL 104 102 98  CO2 23 26 28   GLUCOSE 131* 130* 199*  BUN 14 13 15   CREATININE 0.66 0.77 0.72  CALCIUM 9.7 9.8 9.5  MG  --  1.6  --   PHOS  --  5.1*  --    Liver Function Tests:  Recent Labs Lab 07/07/13 1540 07/08/13 0257  AST 48* 39*  ALT 24 21  ALKPHOS 120* 100  BILITOT 0.8 0.7  PROT 8.2 6.4  ALBUMIN 3.6 2.9*   No results found for this  basename: LIPASE, AMYLASE,  in the last 168 hours No results found for this basename: AMMONIA,  in the last 168 hours CBC:  Recent Labs Lab 07/07/13 1255 07/08/13 0257 07/09/13 0520  WBC 5.9 4.8 5.2  HGB 9.4* 9.1* 9.8*  HCT 30.4* 29.1* 32.2*  MCV 90.5 89.3 89.9  PLT 198 179 196   Cardiac Enzymes:  Recent Labs Lab 07/07/13 1255 07/07/13 1540 07/08/13 0257  TROPONINI <0.30 <0.30 <0.30   BNP (last 3 results)  Recent Labs  07/07/13 1255  PROBNP 612.1*   CBG:  Recent Labs Lab 07/07/13 1656  GLUCAP 94    No results found for this or any previous visit (from the past 240 hour(s)).   Studies: Nm Myocar Multi W/spect W/wall Motion / Ef  07/08/2013   CLINICAL DATA:  Chest pain  EXAM: NUCLEAR MEDICINE MYOCARDIAL PERFUSION IMAGING  NUCLEAR MEDICINE LEFT VENTRICULAR WALL MOTION ANALYSIS  NUCLEAR MEDICINE LEFT VENTRICULAR EJECTION FRACTION CALCULATION  TECHNIQUE: Standard single day myocardial SPECT imaging was performed after resting intravenous injection of Tc-19m sestamibi. After intravenous infusion of Lexiscan (regadenoson) under supervision of cardiology staff, sestamibiwas injected intravenously and standard myocardial SPECT imaging was performed. Quantitative gated imaging was also performed to evaluate left ventricular wall motion and estimate left ventricular ejection fraction.  Radiopharmaceutical: 10+30 mCi Tc86m sestamibiIV.  COMPARISON:  None  FINDINGS: The stress SPECT images demonstrate physiologic distribution of radiopharmaceutical. Rest images demonstrate no perfusion defects. The gated stress SPECT images demonstrate normal left ventricular myocardial thickening. No focal wall motion abnormality is seen. Calculated left ventricular end-diastolic volume 70JJ, end-systolic volume 00XF, ejection fraction of 45%.  IMPRESSION: 1. Negative for pharmacologic-stress induced ischemia. 2. Left ventricular ejection fraction 45%.   Electronically Signed   By: Arne Cleveland M.D.    On: 07/08/2013 15:33   US Abdomen Limited  07/07/2013   CLINICAL DATA:  ABD DISTENTION EVAL FOR ASCITES  EXAM: LIMITED ABDOMEN ULTRASOUND FOR ASCITES  TECHNIQUE: Limited ultrasound survey for ascites was performed in all four abdominal quadrants.  COMPARISON:  None.  US ABDOMEN COMPLETE dated 06/06/2013  FINDINGS: No sonographic evidence of free fluid nor loculated fluid collections within the abdomen.  IMPRESSION: No sonographically appreciable evidence of ascites.   Electronically Signed   By: Margaree Mackintosh M.D.   On: 07/07/2013 16:39    Scheduled Meds: . aspirin EC  81 mg Oral QHS  . atorvastatin  10 mg Oral q1800  . enoxaparin (LOVENOX) injection  40 mg Subcutaneous Q24H  . ferrous fumarate  1 tablet Oral Daily   And  . folic acid  1 mg Oral Daily  . furosemide  40 mg Oral Daily  . insulin aspart  0-15 Units Subcutaneous TID WC  . insulin aspart  0-5 Units Subcutaneous QHS  . insulin aspart  5 Units Subcutaneous TID WC  . insulin glargine  35 Units Subcutaneous QHS  . metoprolol tartrate  25 mg Oral BID  . pantoprazole  40 mg Oral QAC breakfast  . PARoxetine  10 mg Oral q morning - 10a  . sodium chloride  3 mL Intravenous Q12H   Continuous Infusions:   Principal Problem:   Dyspnea on exertion Active Problems:   ADENOCARCINOMA, BREAST, RIGHT   BLADDER CANCER , UNSPEC.   DIABETES MELLITUS, TYPE II, UNCONTROLLED   HYPERLIPIDEMIA   HYPERTENSION, ESSENTIAL NOS   COLON CANCER, HX OF   OSA (obstructive sleep apnea)- C-pap   Exertional angina   CHF (congestive heart failure)   Chest pain    Time spent: > 35 minutes    Velvet Bathe  [facility] Pager [phone] If 7PM-7AM, please contact night-coverage at [email or web address], password [facility] 07/09/2013, 2:03 PM  LOS: 2 days

## 2013-07-09 NOTE — Progress Notes (Signed)
Patient Name: [name] [name] Date of Encounter: 07/09/2013    SUBJECTIVE: The patient is sedentary since admission to the hospital. Therefore she has had no symptoms. Her symptoms have been exertional chest tightness compatible with angina. Her cardiac evaluation reveals a low risk nuclear study and normal LV function by echo.  TELEMETRY:  Normal sinus rhythm Filed Vitals:   07/08/13 1555 07/08/13 2055 07/09/13 0500 07/09/13 0605  BP: 125/60 123/60  132/63  Pulse: 72 75  83  Temp: 98 F (36.7 C) 98.8 F (37.1 C)  98 F (36.7 C)  TempSrc: Oral Oral  Oral  Resp: 18 20  16   Height:      Weight:   185 lb 8 oz (84.142 kg)   SpO2: 98% 96%  95%    Intake/Output Summary (Last 24 hours) at 07/09/13 1028 Last data filed at 07/09/13 0900  Gross per 24 hour  Intake    680 ml  Output   3200 ml  Net  -2520 ml    LABS: Basic Metabolic Panel:  Recent Labs  07/07/13 1255 07/08/13 0257 07/09/13 0520  NA 141 139 139  K 4.1 3.9 3.6*  CL 104 102 98  CO2 23 26 28   GLUCOSE 131* 130* 199*  BUN 14 13 15   CREATININE 0.66 0.77 0.72  CALCIUM 9.7 9.8 9.5  MG  --  1.6  --   PHOS  --  5.1*  --    CBC:  Recent Labs  07/08/13 0257 07/09/13 0520  WBC 4.8 5.2  HGB 9.1* 9.8*  HCT 29.1* 32.2*  MCV 89.3 89.9  PLT 179 196   Cardiac Enzymes:  Recent Labs  07/07/13 1255 07/07/13 1540 07/08/13 0257  TROPONINI <0.30 <0.30 <0.30   Hemoglobin A1C:  Recent Labs  07/07/13 1540  HGBA1C 8.0*   Fasting Lipid Panel:  Recent Labs  07/08/13 0257  CHOL 132  HDL 36*  LDLCALC 76  TRIG 98  CHOLHDL 3.7   BNP    Component Value Date/Time   PROBNP 612.1* 07/07/2013 1255     Radiology/Studies:   IMPRESSION:  Bibasilar densities, right side greater than right. Findings  probably represent areas of atelectasis. Asymmetric dependent edema  or an early infectious process cannot be excluded at the right lung  base.  Right mastectomy.  Electronically Signed  By: Markus Daft  M.D.  On: 07/07/2013 13:50   ECHO: Study Conclusions  - Left ventricle: The cavity size was normal. Systolic function was normal. The estimated ejection fraction was in the range of 55% to 60%. Wall motion was normal; there were no regional wall motion abnormalities. Features are consistent with a pseudonormal left ventricular filling pattern, with concomitant abnormal relaxation and increased filling pressure (grade 2 diastolic dysfunction). - Aortic valve: Mildly calcified annulus. Trileaflet. Moderate diffuse thickening and calcification, consistent with sclerosis. - Mitral valve: Moderate thickening and calcification. Mobility of the posterior leaflet was severely restricted. The findings are consistent with mild stenosis. - Left atrium: The atrium was mildly dilated.  Nuclear Stress Test:   IMPRESSION:  1. Negative for pharmacologic-stress induced ischemia.  2. Left ventricular ejection fraction 45%.  Electronically Signed  By: Arne Cleveland M.D.  On: 07/08/2013 15:33  Physical Exam: Blood pressure 132/63, pulse 83, temperature 98 F (36.7 C), temperature source Oral, resp. rate 16, height 5\' 5"  (1.651 m), weight 185 lb 8 oz (84.142 kg), SpO2 95.00%. Weight change: -9 lb 6.2 oz (-4.258 kg)   Basal rales  are heard. No murmur rub click or gallop is her auscultation. No edema is noted lower extremities. Neck veins are  ASSESSMENT:  1. Clinical symptoms are consistent with angina pectoris. Myocardial perfusion study is low risk but does not completely exclude the possibility of coronary artery disease with matched three-vessel ischemia. 2. Chronic diastolic heart failure, possibly causing the patient's exertional complaints. Diastolic dysfunction noted on echocardiography. BNP is elevated.  3. Long-standing diabetes mellitus  Plan:  1. Convert furosemide to oral 2. Start low dose beta blocker 3. Ambulate 4. SL NTG if prolonged CP 5. Needs OP cardiology  f/u  Demetrios Isaacs 07/09/2013, 10:28 AM

## 2013-07-10 DIAGNOSIS — I5032 Chronic diastolic (congestive) heart failure: Secondary | ICD-10-CM

## 2013-07-10 LAB — GLUCOSE, CAPILLARY
GLUCOSE-CAPILLARY: 202 mg/dL — AB (ref 70–99)
GLUCOSE-CAPILLARY: 147 mg/dL — AB (ref 70–99)
GLUCOSE-CAPILLARY: 182 mg/dL — AB (ref 70–99)
GLUCOSE-CAPILLARY: 319 mg/dL — AB (ref 70–99)
GLUCOSE-CAPILLARY: 332 mg/dL — AB (ref 70–99)
Glucose-Capillary: 142 mg/dL — ABNORMAL HIGH (ref 70–99)
Glucose-Capillary: 225 mg/dL — ABNORMAL HIGH (ref 70–99)
Glucose-Capillary: 271 mg/dL — ABNORMAL HIGH (ref 70–99)
Glucose-Capillary: 340 mg/dL — ABNORMAL HIGH (ref 70–99)
Glucose-Capillary: 180 mg/dL — ABNORMAL HIGH (ref 70–99)

## 2013-07-10 LAB — BASIC METABOLIC PANEL
BUN: 12 mg/dL (ref 6–23)
CALCIUM: 9.4 mg/dL (ref 8.4–10.5)
CO2: 27 meq/L (ref 19–32)
Chloride: 101 mEq/L (ref 96–112)
Creatinine, Ser: 0.69 mg/dL (ref 0.50–1.10)
GFR calc non Af Amer: 85 mL/min — ABNORMAL LOW (ref >90)
Glucose, Bld: 210 mg/dL — ABNORMAL HIGH (ref 70–99)
Potassium: 3.5 mEq/L — ABNORMAL LOW (ref 3.7–5.3)
Sodium: 140 mEq/L (ref 137–147)

## 2013-07-10 MED ORDER — METOPROLOL TARTRATE 25 MG PO TABS: 25.0000 mg | ORAL_TABLET | Freq: Two times a day (BID) | ORAL | Status: DC

## 2013-07-10 MED ORDER — POTASSIUM CHLORIDE CRYS ER 20 MEQ PO TBCR
20.0000 meq | EXTENDED_RELEASE_TABLET | Freq: Every day | ORAL | Status: DC
  Administered 2013-07-10: 20 meq via ORAL
  Filled 2013-07-10: qty 1

## 2013-07-10 MED ORDER — FUROSEMIDE 40 MG PO TABS: 40.0000 mg | ORAL_TABLET | Freq: Every day | ORAL | Status: DC

## 2013-07-10 MED ORDER — POTASSIUM CHLORIDE CRYS ER 20 MEQ PO TBCR: 20.0000 meq | EXTENDED_RELEASE_TABLET | Freq: Every day | ORAL | Status: DC

## 2013-07-10 NOTE — Progress Notes (Signed)
Inpatient Diabetes Program Recommendations  AACE/ADA: New Consensus Statement on Inpatient Glycemic Control (2013)  Target Ranges:  Prepandial:   less than 140 mg/dL      Peak postprandial:   less than 180 mg/dL (1-2 hours)      Critically ill patients:  140 - 180 mg/dL   Fasting hyperglycemia.  (Pt takes a total of 140 units NPH at home and 45 units R per meal, which is high dosing). I agree that she potentially could control her glucose with less) Please consider increase in lantus by 10 units as fasting this am was high at 210 mg/dL Increase to 45 units lantus to start and once fasting cbg's are controlled then can assess the units novolog meal coverage needed.   Thank you, Rosita Kea, RN, CNS, Diabetes Coordinator [phone])

## 2013-07-10 NOTE — Progress Notes (Signed)
       Patient Name: [name] [name] Date of Encounter: 07/10/2013    SUBJECTIVE:Feels much better. Dyspnea and chest tightness no longer present with exertion.  TELEMETRY:  NSR Filed Vitals:   07/09/13 0605 07/09/13 1421 07/09/13 2216 07/10/13 0653  BP: 132/63 113/53 127/50 120/63  Pulse: 83 77 84 74  Temp: 98 F (36.7 C) 98.2 F (36.8 C) 98.5 F (36.9 C) 98.3 F (36.8 C)  TempSrc: Oral Oral Oral Oral  Resp: 16 18 20 20   Height:      Weight:    184 lb 1.6 oz (83.507 kg)  SpO2: 95% 95% 96% 99%    Intake/Output Summary (Last 24 hours) at 07/10/13 0818 Last data filed at 07/10/13 0654  Gross per 24 hour  Intake    720 ml  Output   1800 ml  Net  -1080 ml   NET: 7 liters negative since admission.  LABS: Basic Metabolic Panel:  Recent Labs  07/07/13 1255 07/08/13 0257 07/09/13 0520 07/10/13 0428  NA 141 139 139 140  K 4.1 3.9 3.6* 3.5*  CL 104 102 98 101  CO2 23 26 28 27   GLUCOSE 131* 130* 199* 210*  BUN 14 13 15 12   CREATININE 0.66 0.77 0.72 0.69  CALCIUM 9.7 9.8 9.5 9.4  MG  --  1.6  --   --   PHOS  --  5.1*  --   --    CBC:  Recent Labs  07/08/13 0257 07/09/13 0520  WBC 4.8 5.2  HGB 9.1* 9.8*  HCT 29.1* 32.2*  MCV 89.3 89.9  PLT 179 196   Cardiac Enzymes:  Recent Labs  07/07/13 1255 07/07/13 1540 07/08/13 0257  TROPONINI <0.30 <0.30 <0.30   BNP: No components found with this basename: POCBNP,  Hemoglobin A1C:  Recent Labs  07/07/13 1540  HGBA1C 8.0*   Fasting Lipid Panel:  Recent Labs  07/08/13 0257  CHOL 132  HDL 36*  LDLCALC 76  TRIG 98  CHOLHDL 3.7    Radiology/Studies:  No new data  Physical Exam: Blood pressure 120/63, pulse 74, temperature 98.3 F (36.8 C), temperature source Oral, resp. rate 20, height 5\' 5"  (1.651 m), weight 184 lb 1.6 oz (83.507 kg), SpO2 99.00%. Weight change: -1 lb 6.4 oz (-0.635 kg)   Chest still with some basilar crackles.  ASSESSMENT:  1. Acute on chronic diastolic HF improved with  diuretic and beta blocker therapy 2. Anginal quality CP resolved.  Plan:  1. Lasix 40 mg daily 2. Metoprolol 25 mg BID 3. K Dur 20 Meq daily 4. Need OP f/u with Dr Sharlene Motts and BMET within 7 days. Also needs cardiology f/u in 1-2 weeks.  Demetrios Isaacs 07/10/2013, 8:18 AM

## 2013-07-10 NOTE — Progress Notes (Signed)
Came to visit patient and offer [facility] services to patient. She pleasantly declined. Left brochure and contact information at bedside for her to call in the future in case she changes in her mind. Appreciative of visit. Made inpatient RN case manager aware patient declined [facility] services.  Marthenia Rolling, MSN- RN, BSN- [phone]

## 2013-07-10 NOTE — Discharge Summary (Signed)
Physician Discharge Summary  [name] GUNNELS ZOX:[id number] DOB: Sep 10, 1940 DOA: 07/07/2013  PCP: [name], MD  Admit date: 07/07/2013 Discharge date: 07/10/2013  Time spent: > 35 minutes  Recommendations for Outpatient Follow-up:  1. Please see discharge recs below 2. Also will need a repeat BMP in 1 week  Discharge Diagnoses:  Principal Problem:   Dyspnea on exertion Active Problems:   ADENOCARCINOMA, BREAST, RIGHT   BLADDER CANCER , UNSPEC.   DIABETES MELLITUS, TYPE II, UNCONTROLLED   HYPERLIPIDEMIA   HYPERTENSION, ESSENTIAL NOS   COLON CANCER, HX OF   OSA (obstructive sleep apnea)- C-pap   Exertional angina   CHF (congestive heart failure)   Chest pain   Acute on chronic diastolic HF (heart failure)   Discharge Condition: stable  Diet recommendation: heart healthy, low sodium diet.  Filed Weights   07/08/13 0610 07/09/13 0500 07/10/13 0653  Weight: 86.682 kg (191 lb 1.6 oz) 84.142 kg (185 lb 8 oz) 83.507 kg (184 lb 1.6 oz)    History of present illness:  The patient is a 73 y/o female with h/o DM on insulin, HTN, HPL, IBS, h/o breast cancer, bladder cancer, and colon cancer.  Presented to the ED complaining of DOE and chest tightness.  Hospital Course:  Chest Pain  - resolved.  - Patient had negative for pharmacological- stress induced ischemia  CHF, type unknown  -ECHO reviewed and showing diastolic dysfunction  -Cardiology on board and recommended the following: 1. Lasix 40 mg daily  2. Metoprolol 25 mg BID  3. K Dur 20 Meq daily  4. Need OP f/u with Dr Sharlene Motts and BMET within 7 days. Also needs cardiology f/u in 1-2 weeks.  DM  -Diabetic diet. And will have patient continue home regimen.  She will need to check her blood sugars atleast 2 times daily (fasting and post prandial)  GERD  -Continue home regimen.  H/o Colon Cancer  - Pt to continue routine follow up after discharge.   Procedures:  As listed  above.  Consultations:  cardiology  Discharge Exam: Filed Vitals:   07/10/13 0653  BP: 120/63  Pulse: 74  Temp: 98.3 F (36.8 C)  Resp: 20    General: Pt in NAD, Alert and awake Cardiovascular: RRR, no MRG Respiratory: CTA BL, no wheezes  Discharge Instructions  Discharge Orders   Future Appointments Provider Department Dept Phone   07/28/2013 2:30 PM Jerene Bears, MD [facility] [phone]   09/01/2013 2:30 PM Deatra Robinson, MD [facility] [phone]   Future Orders Complete By Expires   Call MD for:  difficulty breathing, headache or visual disturbances  As directed    Call MD for:  severe uncontrolled pain  As directed    Call MD for:  temperature >100.4  As directed    Diet - low sodium heart healthy  As directed    Discharge instructions  As directed    Scheduling Instructions:     Need OP f/u with Dr Sharlene Motts and BMET within 7 days. Also needs cardiology f/u in 1-2 weeks.   Increase activity slowly  As directed        Medication List    STOP taking these medications       ALEVE 220 MG tablet  Generic drug:  naproxen sodium     diphenoxylate-atropine 2.5-0.025 MG per tablet  Commonly known as:  LOMOTIL      TAKE these medications       aspirin  EC 81 MG tablet  Take 81 mg by mouth at bedtime.     CALCIUM + D PO  Take 1 tablet by mouth daily.     Ferrous Fum-Iron Polysacch-FA 162-115.2-1 MG Caps  Take 1 capsule by mouth every morning.     fish oil-omega-3 fatty acids 1000 MG capsule  Take 1 g by mouth daily.     furosemide 40 MG tablet  Commonly known as:  LASIX  Take 1 tablet (40 mg total) by mouth daily.     glucose blood test strip  1 each by Other route as needed. Use as instructed     HYDROcodone-acetaminophen 5-325 MG per tablet  Commonly known as:  NORCO/VICODIN  Take 1 tablet by mouth every 6 (six) hours as needed (For pain.).     metFORMIN 500 MG 24 hr tablet  Commonly known  as:  GLUCOPHAGE-XR  Take 1,000 mg by mouth 2 (two) times daily.     metoprolol tartrate 25 MG tablet  Commonly known as:  LOPRESSOR  Take 1 tablet (25 mg total) by mouth 2 (two) times daily.     NOVOLIN N RELION [place]  Inject 65-75 Units into the skin 2 (two) times daily. She takes 65 units in the morning and 75 units at bedtime.     NOVOLIN R RELION 100 units/mL injection  Generic drug:  insulin regular  Inject 15 Units into the skin 3 (three) times daily before meals.     omeprazole 20 MG capsule  Commonly known as:  PRILOSEC  Take 1 capsule (20 mg total) by mouth 2 (two) times daily.     PARoxetine 20 MG tablet  Commonly known as:  PAXIL  Take 10 mg by mouth every morning.     potassium chloride SA 20 MEQ tablet  Commonly known as:  K-DUR,KLOR-CON  Take 1 tablet (20 mEq total) by mouth daily.     RELION INSULIN SYRINGE 1ML/31G 31G X 5/16" 1 ML Misc  Generic drug:  Insulin Syringe-Needle U-100     rosuvastatin 10 MG tablet  Commonly known as:  CRESTOR  Take 5 mg by mouth at bedtime.     traMADol 50 MG tablet  Commonly known as:  ULTRAM  Take 50 mg by mouth every 8 (eight) hours as needed (For pain.).       No Known Allergies    The results of significant diagnostics from this hospitalization (including imaging, microbiology, ancillary and laboratory) are listed below for reference.    Significant Diagnostic Studies: Dg Chest 2 View  07/07/2013   CLINICAL DATA:  Shortness of breath.  Tightness in chest.  EXAM: CHEST  2 VIEW  COMPARISON:  04/18/2012  FINDINGS: Patient had a right mastectomy and there are surgical clips in the right axilla. There are new densities at lung bases, particularly on the right side. Coarse lung markings appear to be chronic. Heart size is upper limits of normal but stable. Degenerative changes throughout the thoracic spine.  IMPRESSION: Bibasilar densities, right side greater than right. Findings probably represent areas of atelectasis. Asymmetric  dependent edema or an early infectious process cannot be excluded at the right lung base.  Right mastectomy.   Electronically Signed   By: Markus Daft M.D.   On: 07/07/2013 13:50   Nm Myocar Multi W/spect W/wall Motion / Ef  07/08/2013   CLINICAL DATA:  Chest pain  EXAM: NUCLEAR MEDICINE MYOCARDIAL PERFUSION IMAGING  NUCLEAR MEDICINE LEFT VENTRICULAR WALL MOTION ANALYSIS  NUCLEAR MEDICINE LEFT  VENTRICULAR EJECTION FRACTION CALCULATION  TECHNIQUE: Standard single day myocardial SPECT imaging was performed after resting intravenous injection of Tc-76m sestamibi. After intravenous infusion of Lexiscan (regadenoson) under supervision of cardiology staff, sestamibiwas injected intravenously and standard myocardial SPECT imaging was performed. Quantitative gated imaging was also performed to evaluate left ventricular wall motion and estimate left ventricular ejection fraction.  Radiopharmaceutical: 10+30 mCi Tc49m sestamibiIV.  COMPARISON:  None  FINDINGS: The stress SPECT images demonstrate physiologic distribution of radiopharmaceutical. Rest images demonstrate no perfusion defects. The gated stress SPECT images demonstrate normal left ventricular myocardial thickening. No focal wall motion abnormality is seen. Calculated left ventricular end-diastolic volume 123456, end-systolic volume 0000000, ejection fraction of 45%.  IMPRESSION: 1. Negative for pharmacologic-stress induced ischemia. 2. Left ventricular ejection fraction 45%.   Electronically Signed   By: Arne Cleveland M.D.   On: 07/08/2013 15:33   US Abdomen Limited  07/07/2013   CLINICAL DATA:  ABD DISTENTION EVAL FOR ASCITES  EXAM: LIMITED ABDOMEN ULTRASOUND FOR ASCITES  TECHNIQUE: Limited ultrasound survey for ascites was performed in all four abdominal quadrants.  COMPARISON:  None.  US ABDOMEN COMPLETE dated 06/06/2013  FINDINGS: No sonographic evidence of free fluid nor loculated fluid collections within the abdomen.  IMPRESSION: No sonographically appreciable  evidence of ascites.   Electronically Signed   By: Margaree Mackintosh M.D.   On: 07/07/2013 16:39    Microbiology: No results found for this or any previous visit (from the past 240 hour(s)).   Labs: Basic Metabolic Panel:  Recent Labs Lab 07/07/13 1255 07/08/13 0257 07/09/13 0520 07/10/13 0428  NA 141 139 139 140  K 4.1 3.9 3.6* 3.5*  CL 104 102 98 101  CO2 23 26 28 27   GLUCOSE D34-534* 130* 199* 210*  BUN 14 13 15 12   CREATININE 0.66 0.77 0.72 0.69  CALCIUM 9.7 9.8 9.5 9.4  MG  --  1.6  --   --   PHOS  --  5.1*  --   --    Liver Function Tests:  Recent Labs Lab 07/07/13 1540 07/08/13 0257  AST 48* 39*  ALT 24 21  ALKPHOS 120* 100  BILITOT 0.8 0.7  PROT 8.2 6.4  ALBUMIN 3.6 2.9*   No results found for this basename: LIPASE, AMYLASE,  in the last 168 hours No results found for this basename: AMMONIA,  in the last 168 hours CBC:  Recent Labs Lab 07/07/13 1255 07/08/13 0257 07/09/13 0520  WBC 5.9 4.8 5.2  HGB 9.4* 9.1* 9.8*  HCT 30.4* 29.1* 32.2*  MCV 90.5 89.3 89.9  PLT 198 179 196   Cardiac Enzymes:  Recent Labs Lab 07/07/13 1255 07/07/13 1540 07/08/13 0257  TROPONINI <0.30 <0.30 <0.30   BNP: BNP (last 3 results)  Recent Labs  07/07/13 1255  PROBNP 612.1*   CBG:  Recent Labs Lab 07/09/13 1145 07/09/13 1658 07/09/13 2216 07/10/13 0735 07/10/13 1218  GLUCAP 319* 271* 340* 180* 332*       Signed:  Maki Sweetser  [facility] 07/10/2013, 1:19 PM

## 2013-07-11 ENCOUNTER — Ambulatory Visit: Payer: Medicare Other | Admitting: Family Medicine

## 2013-07-11 ENCOUNTER — Telehealth: Payer: Self-pay | Admitting: Family Medicine

## 2013-07-11 MED ORDER — TRAMADOL HCL 50 MG PO TABS: 50.0000 mg | ORAL_TABLET | ORAL | Status: DC | PRN

## 2013-07-11 NOTE — Telephone Encounter (Signed)
Pt's husband would like you to call concerning pt's med. Pt was dc'd from hospital yesterday, and they told her not to take aleve anymore. Dr fry had her taking aleve .  Pt will need something for the pain in her heel.  Pt also needs post hosp fup.  Is it ok to put 2 sd together ?

## 2013-07-11 NOTE — Telephone Encounter (Signed)
She should avoid Aleve and Ibuprofen. Call in Tramadol 50 mg every 4 hours prn pain, #60 with 2 rf. Okay to create a 30 minute appt

## 2013-07-11 NOTE — Telephone Encounter (Signed)
I spoke with pt's spouse and I called in script. Can you call pt to schedule the office visit? Okay per Dr. Sarajane Jews to create a 30 minute visit.

## 2013-07-14 ENCOUNTER — Telehealth: Payer: Self-pay | Admitting: *Deleted

## 2013-07-14 NOTE — Telephone Encounter (Signed)
Spoke with patient and she is "doing fine and feeling better". She is aware to avoid Aleve and Ibuprofen and aware of appointment.       Vaughan Browner at 07/14/2013 11:31 AM     Status: Signed        Work in appt scheduled 3/10 @345pm .

## 2013-07-14 NOTE — Telephone Encounter (Signed)
Admit date: 07/07/2013  Discharge date: 07/10/2013  Time spent: > 35 minutes  Recommendations for Outpatient Follow-up:  1. Please see discharge recs below 2. Also will need a repeat BMP in 1 week Discharge Diagnoses:  Principal Problem:  Dyspnea on exertion  Active Problems:  ADENOCARCINOMA, BREAST, RIGHT  BLADDER CANCER , UNSPEC.  DIABETES MELLITUS, TYPE II, UNCONTROLLED  HYPERLIPIDEMIA  HYPERTENSION, ESSENTIAL NOS  COLON CANCER, HX OF  OSA (obstructive sleep apnea)- C-pap  Exertional angina  CHF (congestive heart failure)  Chest pain  Acute on chronic diastolic HF (heart failure)  Discharge Condition: stable  Diet recommendation: heart healthy, low sodium diet.  Filed Weights    07/08/13 0610  07/09/13 0500  07/10/13 0653   Weight:  86.682 kg (191 lb 1.6 oz)  84.142 kg (185 lb 8 oz)  83.507 kg (184 lb 1.6 oz)   History of present illness:  The patient is a 73 y/o female with h/o DM on insulin, HTN, HPL, IBS, h/o breast cancer, bladder cancer, and colon cancer. Presented to the ED complaining of DOE and chest tightness.

## 2013-07-14 NOTE — Telephone Encounter (Signed)
Spoke with patient and she is "doing fine and feeling better".  She is aware to avoid Aleve and Ibuprofen and aware of appointment.

## 2013-07-14 NOTE — Telephone Encounter (Signed)
Transitional Care:

## 2013-07-14 NOTE — Telephone Encounter (Signed)
Work in appt scheduled 3/10 @345pm .

## 2013-07-15 ENCOUNTER — Encounter: Payer: Self-pay | Admitting: Family Medicine

## 2013-07-15 ENCOUNTER — Ambulatory Visit (INDEPENDENT_AMBULATORY_CARE_PROVIDER_SITE_OTHER): Payer: Medicare Other | Admitting: Family Medicine

## 2013-07-15 VITALS — BP 124/64 | HR 83 | Temp 98.9°F | Ht 65.0 in | Wt 184.0 lb

## 2013-07-15 DIAGNOSIS — IMO0001 Reserved for inherently not codable concepts without codable children: Secondary | ICD-10-CM | POA: Diagnosis not present

## 2013-07-15 DIAGNOSIS — E1165 Type 2 diabetes mellitus with hyperglycemia: Secondary | ICD-10-CM

## 2013-07-15 DIAGNOSIS — I509 Heart failure, unspecified: Secondary | ICD-10-CM

## 2013-07-15 DIAGNOSIS — M7660 Achilles tendinitis, unspecified leg: Secondary | ICD-10-CM

## 2013-07-15 DIAGNOSIS — R0602 Shortness of breath: Secondary | ICD-10-CM | POA: Diagnosis not present

## 2013-07-15 DIAGNOSIS — I208 Other forms of angina pectoris: Secondary | ICD-10-CM

## 2013-07-15 DIAGNOSIS — I5033 Acute on chronic diastolic (congestive) heart failure: Secondary | ICD-10-CM | POA: Diagnosis not present

## 2013-07-15 NOTE — Progress Notes (Signed)
Pre visit review using our clinic review tool, if applicable. No additional management support is needed unless otherwise documented below in the visit note. 

## 2013-07-16 ENCOUNTER — Encounter: Payer: Self-pay | Admitting: Family Medicine

## 2013-07-16 LAB — BASIC METABOLIC PANEL
BUN: 19 mg/dL (ref 6–23)
CHLORIDE: 100 meq/L (ref 96–112)
CO2: 24 mEq/L (ref 19–32)
Calcium: 9.4 mg/dL (ref 8.4–10.5)
Creatinine, Ser: 0.8 mg/dL (ref 0.4–1.2)
GFR: 71.73 mL/min (ref >60.00)
Glucose, Bld: 166 mg/dL — ABNORMAL HIGH (ref 70–99)
Potassium: 4.2 mEq/L (ref 3.5–5.1)
Sodium: 135 mEq/L (ref 135–145)

## 2013-07-16 LAB — BRAIN NATRIURETIC PEPTIDE: PRO B NATRI PEPTIDE: 96 pg/mL (ref 0.0–100.0)

## 2013-07-16 NOTE — Progress Notes (Signed)
   Subjective:    Patient ID: [name] [name], female    DOB: Sep 24, 1940, 73 y.o.   MRN: [id number]  HPI Here to follow up a hospital stay from 07-07-13 to 07-10-13 for an episode of CHF. She was admitted with swelling and SOB. An ECHO found her to have a normal EF of 55-60% but she has diastolic dysfunction. Her renal function was borderline with a slightly reduced GFR. Her BNP was elevated to 1255. She has aortic sclerosis and mild mitral stenosis. She was diuresed and her HTN med was changed from Bisoprolol to Metoprolol. Since her DC she has felt better with no SOB or swelling. Her BP has been stable. She still has pain in the left Achilles tendon however consistent with tendonitis, and this has persisted for about a month.    Review of Systems  Constitutional: Negative.   Respiratory: Negative.   Cardiovascular: Negative.   Neurological: Negative.        Objective:   Physical Exam  Constitutional: She is oriented to person, place, and time. She appears well-developed and well-nourished.  Walks with a slight limp  Neck: No thyromegaly present.  Cardiovascular: Normal rate, regular rhythm and intact distal pulses.   Murmur heard. Pulmonary/Chest: Effort normal and breath sounds normal. No respiratory distress. She has no wheezes. She has no rales.  Musculoskeletal: She exhibits no edema.  Lymphadenopathy:    She has no cervical adenopathy.  Neurological: She is alert and oriented to person, place, and time.          Assessment & Plan:  She is doing well from a cardiac and renal standpoint. Get a BMET and BNP today. We will refer her to Orthopedics for the tendonitis. She will get established with Dr. Quay Burow on 07-17-13.

## 2013-07-17 ENCOUNTER — Encounter: Payer: Self-pay | Admitting: Cardiovascular Disease

## 2013-07-17 ENCOUNTER — Ambulatory Visit (INDEPENDENT_AMBULATORY_CARE_PROVIDER_SITE_OTHER): Payer: Medicare Other | Admitting: Cardiovascular Disease

## 2013-07-17 VITALS — BP 112/64 | HR 78 | Ht 65.0 in | Wt 182.0 lb

## 2013-07-17 DIAGNOSIS — E782 Mixed hyperlipidemia: Secondary | ICD-10-CM

## 2013-07-17 DIAGNOSIS — I208 Other forms of angina pectoris: Secondary | ICD-10-CM | POA: Diagnosis not present

## 2013-07-17 MED ORDER — FUROSEMIDE 40 MG PO TABS: 40.0000 mg | ORAL_TABLET | Freq: Every day | ORAL | Status: DC

## 2013-07-17 MED ORDER — POTASSIUM CHLORIDE CRYS ER 20 MEQ PO TBCR: 20.0000 meq | EXTENDED_RELEASE_TABLET | Freq: Every day | ORAL | Status: DC

## 2013-07-17 MED ORDER — METOPROLOL TARTRATE 25 MG PO TABS: 25.0000 mg | ORAL_TABLET | Freq: Two times a day (BID) | ORAL | Status: DC

## 2013-07-17 MED ORDER — POTASSIUM CHLORIDE CRYS ER 20 MEQ PO TBCR
20.0000 meq | EXTENDED_RELEASE_TABLET | Freq: Every day | ORAL | Status: DC
Start: 2013-07-17 — End: 2014-07-14

## 2013-07-17 NOTE — Progress Notes (Signed)
07/17/2013 [name] [name]   Oct 29, 1940  [id number]  Primary Physician [name], MD Primary Cardiologist: Lorretta Harp MD Renae Gloss   HPI:  [name] [name] is a very pleasant 73 year old mildly overweight Caucasian female mother of 2 children who presents in followup post discharge from the hospital for congestive heart failure. Her primary care physician is Dr. Delma Freeze. She saw Dr. Candee Furbish in consultation in the hospital. His mood with CHF. Ultimately her EF was found to be normal. I suspect she has diastolic dysfunction. Her Myoview stress test was normal as well. Other problems include hypertension and hyperlipidemia as well as diabetes. Since being home to her diuretic with potassium repletion. Her symptoms have improved. She is aware of self protection and weighs herself daily   Current Outpatient Prescriptions  Medication Sig Dispense Refill  . aspirin EC 81 MG tablet Take 81 mg by mouth at bedtime.      . bisoprolol (ZEBETA) 5 MG tablet       . Calcium Carbonate-Vitamin D (CALCIUM + D PO) Take 1 tablet by mouth daily.      . diphenoxylate-atropine (LOMOTIL) 2.5-0.025 MG per tablet Take 1 tablet by mouth daily.       . Fe Fum-FePoly-Vit C-Vit B3 (INTEGRA) 62.5-62.5-40-3 MG CAPS       . Ferrous Fum-Iron Polysacch-FA 162-115.2-1 MG CAPS Take 1 capsule by mouth every morning.      . fish oil-omega-3 fatty acids 1000 MG capsule Take 1 g by mouth daily.       . furosemide (LASIX) 40 MG tablet Take 1 tablet (40 mg total) by mouth daily.  90 tablet  3  . glucose blood test strip 1 each by Other route as needed. Use as instructed       . HYDROcodone-acetaminophen (NORCO/VICODIN) 5-325 MG per tablet Take 1 tablet by mouth every 6 (six) hours as needed (For pain.).       . Insulin Isophane Human (NOVOLIN N RELION [place]) Inject 65-75 Units into the skin 2 (two) times daily. She takes 65 units in the morning and 75 units at bedtime.      . metFORMIN (GLUCOPHAGE-XR) 500 MG 24  hr tablet Take 1,000 mg by mouth 2 (two) times daily.      . metoprolol tartrate (LOPRESSOR) 25 MG tablet Take 1 tablet (25 mg total) by mouth 2 (two) times daily.  180 tablet  3  . NOVOLIN R RELION 100 UNIT/ML injection Inject 15 Units into the skin 3 (three) times daily before meals.       Marland Kitchen omeprazole (PRILOSEC) 20 MG capsule Take 1 capsule (20 mg total) by mouth 2 (two) times daily.  60 capsule  0  . PARoxetine (PAXIL) 20 MG tablet Take 10 mg by mouth every morning.       . potassium chloride SA (K-DUR,KLOR-CON) 20 MEQ tablet Take 1 tablet (20 mEq total) by mouth daily.  90 tablet  3  . RELION INSULIN SYRINGE 1ML/31G 31G X 5/16" 1 ML MISC       . rosuvastatin (CRESTOR) 10 MG tablet Take 5 mg by mouth at bedtime.      . traMADol (ULTRAM) 50 MG tablet Take 50 mg by mouth every 8 (eight) hours as needed (For pain.).       Marland Kitchen traMADol (ULTRAM) 50 MG tablet Take 1 tablet (50 mg total) by mouth every 4 (four) hours as needed.  60 tablet  2   No current facility-administered  medications for this visit.    No Known Allergies  History   Social History  . Marital Status: Married    Spouse Name: N/A    Number of Children: 2  . Years of Education: N/A   Occupational History  . retired    Social History Main Topics  . Smoking status: Former Smoker -- 2.00 packs/day for 20 years    Types: Cigarettes    Quit date: 05/09/1975  . Smokeless tobacco: Never Used     Comment: smoked 1958-1977, up to 2 ppd  . Alcohol Use: No  . Drug Use: No  . Sexual Activity: Not on file   Other Topics Concern  . Not on file   Social History Narrative  . No narrative on file     Review of Systems: General: negative for chills, fever, night sweats or weight changes.  Cardiovascular: negative for chest pain, dyspnea on exertion, edema, orthopnea, palpitations, paroxysmal nocturnal dyspnea or shortness of breath Dermatological: negative for rash Respiratory: negative for cough or wheezing Urologic:  negative for hematuria Abdominal: negative for nausea, vomiting, diarrhea, bright red blood per rectum, melena, or hematemesis Neurologic: negative for visual changes, syncope, or dizziness All other systems reviewed and are otherwise negative except as noted above.    Blood pressure 112/64, pulse 78, height 5\' 5"  (1.651 m), weight 182 lb (82.555 kg).  General appearance: alert and no distress Neck: no adenopathy, no carotid bruit, no JVD, supple, symmetrical, trachea midline and thyroid not enlarged, symmetric, no tenderness/mass/nodules Lungs: clear to auscultation bilaterally Heart: regular rate and rhythm, S1, S2 normal, no murmur, click, rub or gallop Abdomen: soft, non-tender; bowel sounds normal; no masses,  no organomegaly Extremities: extremities normal, atraumatic, no cyanosis or edema  EKG performed today  ASSESSMENT AND PLAN:   Acute on chronic diastolic HF (heart failure) Patient was admitted on 07/07/13 with shortness of breath and congestive heart failure. She'll follow with me was on a preserved LV function and probably had diastolic dysfunction. Her 2-D echo was normal. A Myoview stress test was nonischemic. She was diuresed 18 pounds and her symptoms improved. She had had several weeks of increasing shortness of breath on exertion and some heaviness in her chest. She no longer has this. We'll continue her current medications including her diuretic. She's been instructed to avoid salt in place of daily. She'll see in mid-level provider back in 3 months me back in 6 months  HYPERTENSION, ESSENTIAL NOS Controlled on current medications  HYPERLIPIDEMIA On statin therapy followed by her PCP      Lorretta Harp MD [facility], [facility] 07/17/2013 3:43 PM

## 2013-07-17 NOTE — Assessment & Plan Note (Signed)
Controlled on current medications 

## 2013-07-17 NOTE — Patient Instructions (Signed)
Your physician wants you to follow-up in: 3 months with an extender and 6 months with Dr Berry. You will receive a reminder letter in the mail two months in advance. If you don't receive a letter, please call our office to schedule the follow-up appointment.  

## 2013-07-17 NOTE — Assessment & Plan Note (Signed)
On statin therapy followed by her PCP 

## 2013-07-17 NOTE — Assessment & Plan Note (Signed)
Patient was admitted on 07/07/13 with shortness of breath and congestive heart failure. She'll follow with me was on a preserved LV function and probably had diastolic dysfunction. Her 2-D echo was normal. A Myoview stress test was nonischemic. She was diuresed 18 pounds and her symptoms improved. She had had several weeks of increasing shortness of breath on exertion and some heaviness in her chest. She no longer has this. We'll continue her current medications including her diuretic. She's been instructed to avoid salt in place of daily. She'll see in mid-level provider back in 3 months me back in 6 months

## 2013-07-18 ENCOUNTER — Telehealth: Payer: Self-pay

## 2013-07-18 NOTE — Telephone Encounter (Signed)
Relevant patient education assigned to patient using Emmi. ° °

## 2013-07-25 ENCOUNTER — Encounter: Payer: Self-pay | Admitting: Internal Medicine

## 2013-07-28 ENCOUNTER — Other Ambulatory Visit: Payer: Self-pay | Admitting: Internal Medicine

## 2013-07-28 ENCOUNTER — Encounter: Payer: Self-pay | Admitting: Internal Medicine

## 2013-07-28 ENCOUNTER — Ambulatory Visit (INDEPENDENT_AMBULATORY_CARE_PROVIDER_SITE_OTHER): Payer: Medicare Other | Admitting: Internal Medicine

## 2013-07-28 ENCOUNTER — Ambulatory Visit (INDEPENDENT_AMBULATORY_CARE_PROVIDER_SITE_OTHER): Payer: Medicare Other

## 2013-07-28 ENCOUNTER — Telehealth: Payer: Self-pay | Admitting: Internal Medicine

## 2013-07-28 VITALS — BP 118/64 | HR 84 | Ht 63.75 in | Wt 185.2 lb

## 2013-07-28 DIAGNOSIS — R195 Other fecal abnormalities: Secondary | ICD-10-CM

## 2013-07-28 DIAGNOSIS — R5381 Other malaise: Secondary | ICD-10-CM

## 2013-07-28 DIAGNOSIS — Z85038 Personal history of other malignant neoplasm of large intestine: Secondary | ICD-10-CM | POA: Diagnosis not present

## 2013-07-28 DIAGNOSIS — R5383 Other fatigue: Secondary | ICD-10-CM

## 2013-07-28 DIAGNOSIS — K7689 Other specified diseases of liver: Secondary | ICD-10-CM

## 2013-07-28 DIAGNOSIS — K219 Gastro-esophageal reflux disease without esophagitis: Secondary | ICD-10-CM

## 2013-07-28 DIAGNOSIS — K2270 Barrett's esophagus without dysplasia: Secondary | ICD-10-CM | POA: Diagnosis not present

## 2013-07-28 DIAGNOSIS — I208 Other forms of angina pectoris: Secondary | ICD-10-CM | POA: Diagnosis not present

## 2013-07-28 DIAGNOSIS — R933 Abnormal findings on diagnostic imaging of other parts of digestive tract: Secondary | ICD-10-CM

## 2013-07-28 DIAGNOSIS — K760 Fatty (change of) liver, not elsewhere classified: Secondary | ICD-10-CM | POA: Insufficient documentation

## 2013-07-28 DIAGNOSIS — R748 Abnormal levels of other serum enzymes: Secondary | ICD-10-CM

## 2013-07-28 DIAGNOSIS — R791 Abnormal coagulation profile: Secondary | ICD-10-CM

## 2013-07-28 LAB — COMPREHENSIVE METABOLIC PANEL
ALBUMIN: 3.8 g/dL (ref 3.5–5.2)
ALT: 36 U/L — ABNORMAL HIGH (ref 0–35)
AST: 51 U/L — ABNORMAL HIGH (ref 0–37)
Alkaline Phosphatase: 115 U/L (ref 39–117)
BUN: 13 mg/dL (ref 6–23)
CO2: 27 mEq/L (ref 19–32)
Calcium: 10 mg/dL (ref 8.4–10.5)
Chloride: 99 mEq/L (ref 96–112)
Creatinine, Ser: 0.9 mg/dL (ref 0.4–1.2)
GFR: 62.9 mL/min (ref >60.00)
GLUCOSE: 195 mg/dL — AB (ref 70–99)
Potassium: 3.9 mEq/L (ref 3.5–5.1)
SODIUM: 138 meq/L (ref 135–145)
TOTAL PROTEIN: 7.9 g/dL (ref 6.0–8.3)
Total Bilirubin: 0.8 mg/dL (ref 0.3–1.2)

## 2013-07-28 LAB — PROTIME-INR: INR: >10 ratio (ref 0.8–1.0)

## 2013-07-28 LAB — CBC
HCT: 33.3 % — ABNORMAL LOW (ref 36.0–46.0)
Hemoglobin: 11 g/dL — ABNORMAL LOW (ref 12.0–15.0)
MCHC: 32.9 g/dL (ref 30.0–36.0)
MCV: 87.2 fl (ref 78.0–100.0)
Platelets: 200 10*3/uL (ref 150.0–400.0)
RBC: 3.82 Mil/uL — AB (ref 3.87–5.11)
RDW: 17.4 % — ABNORMAL HIGH (ref 11.5–14.6)
WBC: 5.8 10*3/uL (ref 4.5–10.5)

## 2013-07-28 NOTE — Progress Notes (Signed)
Patient ID: [name] [name], female   DOB: August 07, 1940, 73 y.o.   MRN: [id number] HPI: [name] [name] is a 73 yo female with PMH of breast cancer, bladder cancer, right-sided colon cancer status post ileocecectomy, diverticulitis status post partial sigmoidectomy, GERD, Barrett's esophagus diagnosed in 2013, family history of esophageal cancer in her son, hypertension, diabetes, hyperlipidemia, sleep apnea, and recent diagnosis of diastolic heart failure who is here for followup. She was previously followed by Dr. Sharlett Iles saw him last in January 2015 before his retirement. She is here today with her husband. She was hospitalized earlier this month with volume overload found to have diastolic heart failure. Diuresis removed approximately 18 pounds of fluid. Echo showed a preserved LV function and stress test was nonischemic. She remains on Lasix.  She also has a history of elevated liver enzymes and fatty liver and abdominal ultrasound was ordered by Dr. Sharlett Iles after her visit in January 2015. This showed liver nodularity and raised the question of cirrhosis. This would be a new diagnosis for her and she has not discussed this with any specific provider yet. No family history of liver disease or cirrhosis to her knowledge though at some point it sounds like her sister needed paracentesis. She reports her sister is live and well  She reports that she feels better after hospitalization and diuresis. She does continue to have some fatigue. Dyspnea on exertion has improved. She denies chest pain. Reports she is eating well without dysphagia or odynophagia. No nausea or vomiting. Bowel habits are regular but she does use Lomotil 1 capsule daily occasionally 2 capsules daily for chronic loose stools after her colon resections. She denies rectal bleeding or melena. She is on iron supplementation for her history of chronic anemia felt in part iron deficient but likely in part anemia of chronic disease    Past Medical  History  Diagnosis Date  . Transaminase or LDH elevation     sees Dr. Zenovia Jarred   . Hiatal hernia   . GERD (gastroesophageal reflux disease)   . Diverticulosis of colon (without mention of hemorrhage) 09/26/2004,09/10/2006  . Hypertension   . Hyperlipidemia   . Hyperglycemia   . IBS (irritable bowel syndrome)   . Allergy     environmental  . Sleep apnea     sees Dr. Gwenette Greet   . Hepatic steatosis   . Arthritis     sees Dr. Hurley Cisco   . Adenocarcinoma, breast     sees Dr. Marcy Panning   . Bladder cancer   . Malignant neoplasm of colon, unspecified site 07/08/2003  . Diabetes mellitus     sees Dr. Chalmers Cater     Past Surgical History  Procedure Laterality Date  . Sigmoid colectomy 2005      for cancer  . Incisonal hernia    . Mastectomy and biopsy    . Bladder cancer 2001    . Squamous cell ca  r  calf  2008    . Carpal tunnel release    . Esophageal dilation    . Colonoscopy    . Colon surgery  1995    for diverticulitis    Current Outpatient Prescriptions  Medication Sig Dispense Refill  . aspirin EC 81 MG tablet Take 81 mg by mouth at bedtime.      . bisoprolol (ZEBETA) 5 MG tablet       . Calcium Carbonate-Vitamin [name] (CALCIUM + [name] PO) Take 1 tablet by mouth daily.      Marland Kitchen  diphenoxylate-atropine (LOMOTIL) 2.5-0.025 MG per tablet Take 1 tablet by mouth daily.       . Fe Fum-FePoly-Vit C-Vit B3 (INTEGRA) 62.5-62.5-40-3 MG CAPS       . Ferrous Fum-Iron Polysacch-FA 162-115.2-1 MG CAPS Take 1 capsule by mouth every morning.      . fish oil-omega-3 fatty acids 1000 MG capsule Take 1 g by mouth daily.       . furosemide (LASIX) 40 MG tablet Take 1 tablet (40 mg total) by mouth daily.  90 tablet  3  . glucose blood test strip 1 each by Other route as needed. Use as instructed       . Insulin Isophane Human (NOVOLIN N RELION [place]) Inject 65-75 Units into the skin 2 (two) times daily. She takes 65 units in the morning and 75 units at bedtime.      . metFORMIN (GLUCOPHAGE-XR)  500 MG 24 hr tablet Take 1,000 mg by mouth 2 (two) times daily.      . metoprolol tartrate (LOPRESSOR) 25 MG tablet Take 1 tablet (25 mg total) by mouth 2 (two) times daily.  180 tablet  3  . NOVOLIN R RELION 100 UNIT/ML injection Inject 15 Units into the skin 3 (three) times daily before meals.       Marland Kitchen omeprazole (PRILOSEC) 20 MG capsule Take 1 capsule (20 mg total) by mouth 2 (two) times daily.  60 capsule  0  . PARoxetine (PAXIL) 20 MG tablet Take 10 mg by mouth every morning.       . potassium chloride SA (K-DUR,KLOR-CON) 20 MEQ tablet Take 1 tablet (20 mEq total) by mouth daily.  90 tablet  3  . RELION INSULIN SYRINGE 1ML/31G 31G X 5/16" 1 ML MISC       . rosuvastatin (CRESTOR) 10 MG tablet Take 5 mg by mouth at bedtime.      . traMADol (ULTRAM) 50 MG tablet Take 1 tablet (50 mg total) by mouth every 4 (four) hours as needed.  60 tablet  2   No current facility-administered medications for this visit.    No Known Allergies  Family History  Problem Relation Age of Onset  . Diabetes Mother   . Skin cancer Mother   . Lung cancer Father   . Lymphoma Sister   . Hypertension Sister   . Diabetes Sister   . Hypertension Sister   . Breast cancer Sister   . Diabetes Sister   . Hypertension Sister   . Inflammatory bowel disease Daughter   . Stroke Neg Hx     History  Substance Use Topics  . Smoking status: Former Smoker -- 2.00 packs/day for 20 years    Types: Cigarettes    Quit date: 05/09/1975  . Smokeless tobacco: Never Used     Comment: smoked 1958-1977, up to 2 ppd  . Alcohol Use: No    ROS: As per history of present illness, otherwise negative  BP 118/64  Pulse 84  Ht 5' 3.75" (1.619 m)  Wt 185 lb 4 oz (84.029 kg)  BMI 32.06 kg/m2 Constitutional: Well-developed and well-nourished. No distress. HEENT: Normocephalic and atraumatic. Oropharynx is clear and moist. No oropharyngeal exudate. Conjunctivae are normal.  No scleral icterus. Neck: Neck supple. Trachea  midline. Cardiovascular: Normal rate, regular rhythm and intact distal pulses. 2/6 sem Pulmonary/chest: Effort normal and breath sounds normal. No wheezing, rales or rhonchi. Abdominal: Soft, nontender, nondistended. Bowel sounds active throughout.  no appreciable fluid wave  Extremities: no clubbing, cyanosis, or  edema Lymphadenopathy: No cervical adenopathy noted. Neurological: Alert and oriented to person place and time. Skin: Skin is warm and dry. No rashes noted. Psychiatric: Normal mood and affect. Behavior is normal.  RELEVANT LABS AND IMAGING: CBC    Component Value Date/Time   WBC 5.2 07/09/2013 0520   WBC 7.3 07/28/2011 1540   WBC 7.8 01/24/2007 1456   RBC 3.58* 07/09/2013 0520   RBC 3.84 07/28/2011 1540   HGB 9.8* 07/09/2013 0520   HGB 11.5* 07/28/2011 1540   HGB 12.6 01/24/2007 1456   HCT 32.2* 07/09/2013 0520   HCT 34.8 07/28/2011 1540   HCT 37.7 01/24/2007 1456   PLT 196 07/09/2013 0520   PLT 207 07/28/2011 1540   PLT 256 01/24/2007 1456   MCV 89.9 07/09/2013 0520   MCV 90.5 07/28/2011 1540   MCV 90 01/24/2007 1456   MCH 27.4 07/09/2013 0520   MCH 29.8 07/28/2011 1540   MCH 30.3 01/24/2007 1456   MCHC 30.4 07/09/2013 0520   MCHC 33.0 07/28/2011 1540   MCHC 33.5 01/24/2007 1456   RDW 18.1* 07/09/2013 0520   RDW 14.9* 07/28/2011 1540   RDW 11.6 01/24/2007 1456   LYMPHSABS 1.7 06/03/2013 1138   LYMPHSABS 2.7 07/28/2011 1540   LYMPHSABS 2.9 01/24/2007 1456   MONOABS 0.6 06/03/2013 1138   MONOABS 0.7 07/28/2011 1540   EOSABS 0.1 06/03/2013 1138   EOSABS 0.1 07/28/2011 1540   EOSABS 0.2 01/24/2007 1456   BASOSABS 0.0 06/03/2013 1138   BASOSABS 0.1 07/28/2011 1540   BASOSABS 0.1 01/24/2007 1456    CMP     Component Value Date/Time   NA 135 07/15/2013 1632   NA 138 01/18/2006 1426   K 4.2 07/15/2013 1632   K 4.2 01/18/2006 1426   CL 100 07/15/2013 1632   CL 99 01/18/2006 1426   CO2 24 07/15/2013 1632   CO2 29 01/18/2006 1426   GLUCOSE 166* 07/15/2013 1632   GLUCOSE 130* 01/18/2006 1426   BUN 19  07/15/2013 1632   BUN 19 01/18/2006 1426   CREATININE 0.8 07/15/2013 1632   CREATININE 0.9 01/18/2006 1426   CALCIUM 9.4 07/15/2013 1632   CALCIUM 10.3 01/18/2006 1426   PROT 6.4 07/08/2013 0257   ALBUMIN 2.9* 07/08/2013 0257   AST 39* 07/08/2013 0257   ALT 21 07/08/2013 0257   ALKPHOS 100 07/08/2013 0257   BILITOT 0.7 07/08/2013 0257   GFRNONAA 85* 07/10/2013 0428   GFRAA >90 07/10/2013 0428    Results for [name] [name], [name] [name] (MRN [id number]) as of 07/28/2013 15:25  Ref. Range 08/25/2011 10:42 08/15/2012 12:03 06/03/2013 11:38 07/07/2013 12:55 07/07/2013 13:00 07/07/2013 15:40 07/08/2013 02:57  Alkaline Phosphatase Latest Range: 39-117 U/L 108     120 (H) 100  Albumin Latest Range: 3.5-5.2 g/dL 3.7     3.6 2.9 (L)  AST Latest Range: 0-37 U/L 53 (H) 52 (H)    48 (H) 39 (H)  ALT Latest Range: 0-35 U/L 38 (H) 39 (H)    24 21  Total Protein Latest Range: 6.0-8.3 g/dL 7.9     8.2 6.4  Bilirubin, Direct Latest Range: 0.0-0.3 mg/dL 0.1     0.2   Indirect Bilirubin Latest Range: 0.3-0.9 mg/dL      0.6   Total Bilirubin Latest Range: 0.3-1.2 mg/dL 0.6     0.8 0.7   Erythrocyte Sedimentation Rate     Component Value Date/Time   ESRSEDRATE 65* 12/12/2012 1439    Iron/TIBC/Ferritin    Component Value Date/Time  IRON 62 06/03/2013 1138   FERRITIN 15.6 06/03/2013 1138     ULTRASOUND ABDOMEN COMPLETE - 2015   COMPARISON:  Abdomen MRI, 04/13/2011 and abdomen CT, 09/05/2007.   FINDINGS: Gallbladder:   No gallstones or wall thickening visualized. No sonographic Murphy sign noted.   Common bile duct:   Diameter: 6.8 mm.  No evidence of a duct stone.   Liver:   Liver has a coarsened heterogeneous echotexture with surface nodularity. No discrete liver mass or focal lesion is seen. Hepatopetal flow was documented in the portal vein.   IVC:   No abnormality visualized.   Pancreas:   Visualized portion unremarkable.   Spleen:   Spleen is mildly enlarged with a volume of 371 ml. No splenic mass or focal  lesion.   Right Kidney:   Length: 11.7 cm. Echogenicity within normal limits. No mass or hydronephrosis visualized.   Left Kidney:   Length: 12.3 cm. Echogenicity within normal limits. No mass or hydronephrosis visualized.   Abdominal aorta:   No aneurysm visualized.   Other findings:   No ascites.   IMPRESSION: 1. Sonographic appearance of the liver is consistent with cirrhosis. No liver mass or focal lesion. 2. Mild splenomegaly suggests least mild portal venous hypertension although there is no ascites. 3. No other abnormalities.  No acute findings.    GI procedures --Colonoscopy 12/18/2011 -- surgical ileocolonic anastomosis patent and normal-appearing. Moderate diverticulosis in the left colon. 5 mm sessile rectal polyp removed with cold snare. Hemorrhoids. Pathology = inflammatory polyp no adenomatous tissue --EGD 12/18/2011 -- irregular Z line biopsied multiple scattered benign fundic gland-appearing polyps. Otherwise normal. Pathology = Barrett's metaplasia without dysplasia or malignancy  ASSESSMENT/PLAN: 73 yo female with PMH of breast cancer, bladder cancer, right-sided colon cancer status post ileocecectomy, diverticulitis status post partial sigmoidectomy, GERD, Barrett's esophagus diagnosed in 2013, family history of esophageal cancer in her son, hypertension, diabetes, hyperlipidemia, sleep apnea, and recent diagnosis of diastolic heart failure who is here for followup  1.  Elevated LFTs/elevated liver enzymes/fatty liver/possible cirrhosis -- she has had mild persistent elevation in serum transaminases which one would expect in NASH. Her recent ultrasound raised the question of cirrhosis the biopsy has never been performed. We discussed this at length today including elevated liver enzymes, the possible causes for that, and also nonalcoholic steatohepatitis. We discussed how nonalcoholic steatohepatitis as the most common cause for cirrhosis. Her risk factors for  NASH for hypertension, hyperlipidemia, diabetes and obesity.  I have recommended another test to evaluate for fibrosis and we will order Fibrosure (we also discussed elastography, but will start with FibroSure).  I will also repeat labs to include CBC, CMP and INR. ANA, IgG and ASMA to exclude AIH, antimitochondrial antibody and ceruloplasmin.  I like to exclude other sources of chronic liver disease fatty liver/NASH is felt most likely --If fibrosure is consistent with cirrhosis, will repeat EGD for variceal screening --Low fat diet, weight reduction if possible  2. Barrett's esophagus -- diagnosed in 2013. No dysplasia, surveillance is recommended every 3-5 years. We'll await the results as discussed a #1, as we may be proceeding to endoscopy for variceal screening. She will continue omeprazole 20 mg twice daily for now  3.  GERD -- heartburn is well-controlled, no dysphagia, when she is using omeprazole 20 mg twice daily. We will continue this dose and frequency for now  4.  History of colon cancer -- last colonoscopy reviewed. The one rectal polyp was not adenomatous. Based on current guidelines  we will plan repeat colonoscopy in August 2018. She is aware of this recommendation  5.  Chronic loose stools -- continue Lomotil 1-2 capsules daily as needed  Of note she lives in [place] from May 1 to November 1 but says she can come back for appointments when necessary

## 2013-07-28 NOTE — Telephone Encounter (Signed)
INR returned at critical high, greater than 10.0 I am suspicious that this is erroneous. She is not on anticoagulation I called and spoke to her husband by phone. The patient feels well as she did in clinic I asked that they return tomorrow morning to have PT/INR repeated, also check PTT I asked her to go to the ER for any new or concerning symptoms tonight were certainly with any bleeding. He voiced understanding

## 2013-07-28 NOTE — Patient Instructions (Signed)
Your physician has requested that you go to the basement for  lab work before leaving today.   

## 2013-07-29 ENCOUNTER — Other Ambulatory Visit (INDEPENDENT_AMBULATORY_CARE_PROVIDER_SITE_OTHER): Payer: Medicare Other

## 2013-07-29 DIAGNOSIS — R791 Abnormal coagulation profile: Secondary | ICD-10-CM

## 2013-07-29 DIAGNOSIS — M659 Synovitis and tenosynovitis, unspecified: Secondary | ICD-10-CM | POA: Diagnosis not present

## 2013-07-29 LAB — ANTI-SMOOTH MUSCLE ANTIBODY, IGG: Smooth Muscle Ab: 8 U (ref <20)

## 2013-07-29 LAB — PROTIME-INR
INR: 1.4 ratio — ABNORMAL HIGH (ref 0.8–1.0)
PROTHROMBIN TIME: 14.5 s — AB (ref 10.2–12.4)

## 2013-07-29 LAB — APTT: APTT: 32 s — AB (ref 21.7–28.8)

## 2013-07-29 LAB — ANA: Anti Nuclear Antibody(ANA): NEGATIVE

## 2013-07-29 NOTE — Addendum Note (Signed)
Addended by: Marlon Pel on: 07/29/2013 08:11 AM   Modules accepted: Orders

## 2013-07-30 LAB — NASH FIBROSURE
ALPHA 2-MACROGLOBULINS, QN: 331 mg/dL — ABNORMAL HIGH (ref 110–276)
ALT (SGPT) P5P: 37 IU/L (ref 0–40)
APOLIPOPROTEIN A I: 121 mg/dL (ref 110–205)
AST (SGOT) P5P: 55 IU/L — AB (ref 0–40)
BILIRUBIN, TOTAL: 0.5 mg/dL (ref 0.0–1.2)
Cholesterol, Total: 167 mg/dL (ref 100–199)
FIBROSIS SCORE: 0.78 — AB (ref 0.00–0.21)
GGT: 147 IU/L — ABNORMAL HIGH (ref 0–60)
Glucose: 194 mg/dL — ABNORMAL HIGH (ref 65–99)
Haptoglobin: 94 mg/dL (ref 34–200)
Height: 63 [in_us]
NASH Score: 0.75
STEATOSIS SCORE: 0.92 — AB (ref 0.00–0.30)
Triglycerides: 236 mg/dL — ABNORMAL HIGH (ref 0–149)
Weight: 185 [lb_av]

## 2013-07-30 LAB — ANTI-SMOOTH MUSCLE/MITOCHOND.
Mitochondrial Ab: 9.1 Units (ref 0.0–20.0)
Smooth Muscle Ab: 12 Units (ref 0–19)

## 2013-07-31 LAB — CERULOPLASMIN

## 2013-08-04 LAB — CERULOPLASMIN: CERULOPLASMIN: 36 mg/dL (ref 18–53)

## 2013-08-05 DIAGNOSIS — Z9189 Other specified personal risk factors, not elsewhere classified: Secondary | ICD-10-CM | POA: Diagnosis not present

## 2013-08-05 DIAGNOSIS — Z124 Encounter for screening for malignant neoplasm of cervix: Secondary | ICD-10-CM | POA: Diagnosis not present

## 2013-08-06 ENCOUNTER — Other Ambulatory Visit: Payer: Self-pay | Admitting: Internal Medicine

## 2013-08-06 DIAGNOSIS — Z85038 Personal history of other malignant neoplasm of large intestine: Secondary | ICD-10-CM | POA: Diagnosis not present

## 2013-08-06 DIAGNOSIS — R5383 Other fatigue: Secondary | ICD-10-CM | POA: Diagnosis not present

## 2013-08-06 DIAGNOSIS — K2270 Barrett's esophagus without dysplasia: Secondary | ICD-10-CM | POA: Diagnosis not present

## 2013-08-06 DIAGNOSIS — K7689 Other specified diseases of liver: Secondary | ICD-10-CM | POA: Diagnosis not present

## 2013-08-06 DIAGNOSIS — R5381 Other malaise: Secondary | ICD-10-CM | POA: Diagnosis not present

## 2013-08-08 LAB — CERULOPLASMIN: Ceruloplasmin: 29 mg/dL (ref 18–53)

## 2013-08-14 ENCOUNTER — Other Ambulatory Visit: Payer: Self-pay

## 2013-08-14 ENCOUNTER — Ambulatory Visit (AMBULATORY_SURGERY_CENTER): Payer: Self-pay

## 2013-08-14 VITALS — Ht 65.0 in | Wt 188.0 lb

## 2013-08-14 DIAGNOSIS — K7689 Other specified diseases of liver: Secondary | ICD-10-CM

## 2013-08-21 ENCOUNTER — Ambulatory Visit (AMBULATORY_SURGERY_CENTER): Payer: Medicare Other | Admitting: Internal Medicine

## 2013-08-21 ENCOUNTER — Encounter: Payer: Self-pay | Admitting: Internal Medicine

## 2013-08-21 VITALS — BP 146/73 | HR 75 | Temp 98.6°F | Resp 20 | Ht 65.0 in | Wt 188.0 lb

## 2013-08-21 DIAGNOSIS — I10 Essential (primary) hypertension: Secondary | ICD-10-CM | POA: Diagnosis not present

## 2013-08-21 DIAGNOSIS — F341 Dysthymic disorder: Secondary | ICD-10-CM | POA: Diagnosis not present

## 2013-08-21 DIAGNOSIS — K2960 Other gastritis without bleeding: Secondary | ICD-10-CM

## 2013-08-21 DIAGNOSIS — G4733 Obstructive sleep apnea (adult) (pediatric): Secondary | ICD-10-CM | POA: Diagnosis not present

## 2013-08-21 DIAGNOSIS — K7460 Unspecified cirrhosis of liver: Secondary | ICD-10-CM | POA: Diagnosis not present

## 2013-08-21 DIAGNOSIS — K7689 Other specified diseases of liver: Secondary | ICD-10-CM | POA: Diagnosis not present

## 2013-08-21 DIAGNOSIS — D649 Anemia, unspecified: Secondary | ICD-10-CM | POA: Diagnosis not present

## 2013-08-21 DIAGNOSIS — K219 Gastro-esophageal reflux disease without esophagitis: Secondary | ICD-10-CM | POA: Diagnosis not present

## 2013-08-21 LAB — GLUCOSE, CAPILLARY
Glucose-Capillary: 170 mg/dL — ABNORMAL HIGH (ref 70–99)
Glucose-Capillary: 141 mg/dL — ABNORMAL HIGH (ref 70–99)

## 2013-08-21 MED ORDER — ONDANSETRON 4 MG PO TBDP: 4.0000 mg | ORAL_TABLET | Freq: Three times a day (TID) | ORAL | Status: DC | PRN

## 2013-08-21 MED ORDER — SODIUM CHLORIDE 0.9 % IV SOLN: 500.0000 mL | INTRAVENOUS | Status: DC

## 2013-08-21 MED ORDER — SUCRALFATE 1 GM/10ML PO SUSP: 1.0000 g | Freq: Three times a day (TID) | ORAL | Status: DC

## 2013-08-21 NOTE — Progress Notes (Signed)
Called to room to assist during endoscopic procedure.  Patient ID and intended procedure confirmed with present staff. Received instructions for my participation in the procedure from the performing physician.  

## 2013-08-21 NOTE — Progress Notes (Signed)
Report to pacu rn, vss, bbs=clear 

## 2013-08-21 NOTE — Patient Instructions (Signed)
YOU HAD AN ENDOSCOPIC PROCEDURE TODAY AT THE [facility]: Refer to the procedure report that was given to you for any specific questions about what was found during the examination.  If the procedure report does not answer your questions, please call your gastroenterologist to clarify.  If you requested that your care partner not be given the details of your procedure findings, then the procedure report has been included in a sealed envelope for you to review at your convenience later.  YOU SHOULD EXPECT: Some feelings of bloating in the abdomen. Passage of more gas than usual.  Walking can help get rid of the air that was put into your GI tract during the procedure and reduce the bloating. If you had a lower endoscopy (such as a colonoscopy or flexible sigmoidoscopy) you may notice spotting of blood in your stool or on the toilet paper. If you underwent a bowel prep for your procedure, then you may not have a normal bowel movement for a few days.  DIET: Your first meal following the procedure should be a light meal and then it is ok to progress to your normal diet.  A half-sandwich or bowl of soup is an example of a good first meal.  Heavy or fried foods are harder to digest and may make you feel nauseous or bloated.  Likewise meals heavy in dairy and vegetables can cause extra gas to form and this can also increase the bloating.  Drink plenty of fluids but you should avoid alcoholic beverages for 24 hours.  ACTIVITY: Your care partner should take you home directly after the procedure.  You should plan to take it easy, moving slowly for the rest of the day.  You can resume normal activity the day after the procedure however you should NOT DRIVE or use heavy machinery for 24 hours (because of the sedation medicines used during the test).    SYMPTOMS TO REPORT IMMEDIATELY: A gastroenterologist can be reached at any hour.  During normal business hours, 8:30 AM to 5:00 PM Monday through Friday,  call [phone].  After hours and on weekends, please call the GI answering service at [phone] who will take a message and have the physician on call contact you.     Following upper endoscopy (EGD)  Vomiting of blood or coffee ground material  New chest pain or pain under the shoulder blades  Painful or persistently difficult swallowing  New shortness of breath  Fever of 100F or higher  Black, tarry-looking stools  FOLLOW UP: If any biopsies were taken you will be contacted by phone or by letter within the next 1-3 weeks.  Call your gastroenterologist if you have not heard about the biopsies in 3 weeks.  Our staff will call the home number listed on your records the next business day following your procedure to check on you and address any questions or concerns that you may have at that time regarding the information given to you following your procedure. This is a courtesy call and so if there is no answer at the home number and we have not heard from you through the emergency physician on call, we will assume that you have returned to your regular daily activities without incident.  SIGNATURES/CONFIDENTIALITY: You and/or your care partner have signed paperwork which will be entered into your electronic medical record.  These signatures attest to the fact that that the information above on your After Visit Summary has been reviewed and is understood.    Full responsibility of the confidentiality of this discharge information lies with you and/or your care-partner.    Handout was given to husband on gastritis. Await biopsy results. Continue taking omeprazole twice daily and add carafate 1 gm before meals and at bed time.  Rx was called to your pharmacy. Office follow up next available appointment, Dr. Vena Rua 3rd floor CMA will call you with an appointment. Blood sugar was 141 in the recovery room.  Please call if any questions or concerns.

## 2013-08-21 NOTE — Op Note (Signed)
[facility]  [address] [place], [address]   ENDOSCOPY PROCEDURE REPORT  PATIENT: [name] [name], [name] [name]  MR#: [id number] BIRTHDATE: [birth year] , 72  yrs. old GENDER: Female ENDOSCOPIST: Jerene Bears, MD PROCEDURE DATE:  08/21/2013 PROCEDURE:  EGD w/ biopsy ASA CLASS:     Class III INDICATIONS:  Screening for varices. MEDICATIONS: MAC sedation, administered by CRNA and Propofol (Diprivan) 150 mg IV TOPICAL ANESTHETIC: Cetacaine Spray  DESCRIPTION OF PROCEDURE: After the risks benefits and alternatives of the procedure were thoroughly explained, informed consent was obtained.  The LB [id number] endoscope was introduced through the mouth and advanced to the second portion of the duodenum. Without limitations.  The instrument was slowly withdrawn as the mucosa was fully examined.      ESOPHAGUS: The mucosa of the esophagus appeared normal.  No esophageal varices were seen.  STOMACH: Moderate gastritis (inflammation) was found in the gastric body and gastric antrum.  Multiple biopsies were performed using cold forceps.   A sessile polyp measuring 10-12 mm in size was found on the greater curvature of the distal gastric body.  This polyp had adherent heme. Multiple biopsies was performed using cold forceps.  Sample sent for histology.   A few sessile polyps without bleeding, ranging between 3-17mm in size, were found in the gastric fundus and proximal gastric body.  Multiple biopsies was performed using cold forceps.  DUODENUM: The duodenal mucosa showed no abnormalities in the bulb and second portion of the duodenum.  Retroflexed views revealed no abnormalities.   No gastric varices seen. The scope was then withdrawn from the patient and the procedure completed.  COMPLICATIONS: There were no complications.  ENDOSCOPIC IMPRESSION: 1.   The mucosa of the esophagus appeared normal.  No esophageal varices 2.   Gastritis (inflammation) was found in the  gastric body and gastric antrum; multiple biopsies 3.   Sessile polyp with heme measuring 10-12 mm in size was found on the greater curvature of the gastric body; biopsies 4.   Few sessile polyps without heme ranging between 3-17mm in size were found in the gastric fundus and gastric body; biopsies 5.   The duodenal mucosa showed no abnormalities in the bulb and second portion of the duodenum  RECOMMENDATIONS: 1.  Await biopsy results 2.  Follow-up of helicobacter pylori status, treat if indicated 3.  Continue twice daily omeprazole for acid suppression.  Addition of sucralfate 1 g before meals and at bedtime 4.  Office follow-up next available  eSigned:  Jerene Bears, MD 08/21/2013 2:40 PM   CC:The Patient and [name], MD  PATIENT NAME:  [name] [name], [name] [name] MR#: [id number]

## 2013-08-22 ENCOUNTER — Telehealth: Payer: Self-pay | Admitting: *Deleted

## 2013-08-22 NOTE — Telephone Encounter (Signed)
  Follow up Call-  Call back number 08/21/2013 12/18/2011  Post procedure Call Back phone  # [phone]  Permission to leave phone message Yes Yes    Pt was still in bed; spoke with her husband Patient questions:  Do you have a fever, pain , or abdominal swelling? no Pain Score  0 *  Have you tolerated food without any problems? yes  Have you been able to return to your normal activities? yes  Do you have any questions about your discharge instructions: Diet   no Medications  no Follow up visit  no  Do you have questions or concerns about your Care? no  Actions: * If pain score is 4 or above: No action needed, pain <4.

## 2013-08-25 DIAGNOSIS — Z853 Personal history of malignant neoplasm of breast: Secondary | ICD-10-CM | POA: Diagnosis not present

## 2013-08-25 DIAGNOSIS — R141 Gas pain: Secondary | ICD-10-CM | POA: Diagnosis not present

## 2013-08-26 ENCOUNTER — Telehealth: Payer: Self-pay | Admitting: Oncology

## 2013-08-26 ENCOUNTER — Encounter: Payer: Self-pay | Admitting: Internal Medicine

## 2013-08-26 DIAGNOSIS — IMO0002 Reserved for concepts with insufficient information to code with codable children: Secondary | ICD-10-CM | POA: Diagnosis not present

## 2013-08-26 DIAGNOSIS — M66369 Spontaneous rupture of flexor tendons, unspecified lower leg: Secondary | ICD-10-CM | POA: Diagnosis not present

## 2013-08-26 DIAGNOSIS — M24673 Ankylosis, unspecified ankle: Secondary | ICD-10-CM | POA: Diagnosis not present

## 2013-08-26 NOTE — Telephone Encounter (Signed)
, °

## 2013-08-31 ENCOUNTER — Other Ambulatory Visit: Payer: Self-pay | Admitting: Internal Medicine

## 2013-09-01 ENCOUNTER — Ambulatory Visit: Payer: Medicare Other | Admitting: Oncology

## 2013-09-05 ENCOUNTER — Telehealth: Payer: Self-pay | Admitting: Hematology and Oncology

## 2013-09-05 ENCOUNTER — Telehealth: Payer: Self-pay | Admitting: *Deleted

## 2013-09-05 ENCOUNTER — Ambulatory Visit (HOSPITAL_BASED_OUTPATIENT_CLINIC_OR_DEPARTMENT_OTHER): Payer: Medicare Other | Admitting: Hematology and Oncology

## 2013-09-05 ENCOUNTER — Ambulatory Visit (HOSPITAL_BASED_OUTPATIENT_CLINIC_OR_DEPARTMENT_OTHER): Payer: Medicare Other

## 2013-09-05 VITALS — BP 128/75 | HR 86 | Temp 98.5°F | Resp 18 | Ht 65.0 in | Wt 186.2 lb

## 2013-09-05 DIAGNOSIS — E876 Hypokalemia: Secondary | ICD-10-CM

## 2013-09-05 DIAGNOSIS — Z8551 Personal history of malignant neoplasm of bladder: Secondary | ICD-10-CM

## 2013-09-05 DIAGNOSIS — Z85038 Personal history of other malignant neoplasm of large intestine: Secondary | ICD-10-CM

## 2013-09-05 DIAGNOSIS — D649 Anemia, unspecified: Secondary | ICD-10-CM

## 2013-09-05 DIAGNOSIS — Z853 Personal history of malignant neoplasm of breast: Secondary | ICD-10-CM | POA: Diagnosis not present

## 2013-09-05 DIAGNOSIS — C50919 Malignant neoplasm of unspecified site of unspecified female breast: Secondary | ICD-10-CM

## 2013-09-05 LAB — COMPREHENSIVE METABOLIC PANEL (CC13)
ALT: 26 U/L (ref 0–55)
AST: 45 U/L — ABNORMAL HIGH (ref 5–34)
Albumin: 3.4 g/dL — ABNORMAL LOW (ref 3.5–5.0)
Alkaline Phosphatase: 133 U/L (ref 40–150)
Anion Gap: 17 mEq/L — ABNORMAL HIGH (ref 3–11)
BUN: 15.5 mg/dL (ref 7.0–26.0)
CALCIUM: 10.4 mg/dL (ref 8.4–10.4)
CHLORIDE: 104 meq/L (ref 98–109)
CO2: 21 meq/L — AB (ref 22–29)
CREATININE: 0.8 mg/dL (ref 0.6–1.1)
GLUCOSE: 106 mg/dL (ref 70–140)
Potassium: 3.3 mEq/L — ABNORMAL LOW (ref 3.5–5.1)
Sodium: 142 mEq/L (ref 136–145)
Total Bilirubin: 0.55 mg/dL (ref 0.20–1.20)
Total Protein: 8.1 g/dL (ref 6.4–8.3)

## 2013-09-05 LAB — CBC & DIFF AND RETIC
BASO%: 0.7 % (ref 0.0–2.0)
Basophils Absolute: 0 10*3/uL (ref 0.0–0.1)
EOS%: 1.8 % (ref 0.0–7.0)
Eosinophils Absolute: 0.1 10*3/uL (ref 0.0–0.5)
HEMATOCRIT: 32.6 % — AB (ref 34.8–46.6)
HGB: 10.7 g/dL — ABNORMAL LOW (ref 11.6–15.9)
IMMATURE RETIC FRACT: 9.9 % (ref 1.60–10.00)
LYMPH#: 2.1 10*3/uL (ref 0.9–3.3)
LYMPH%: 36.3 % (ref 14.0–49.7)
MCH: 28.9 pg (ref 25.1–34.0)
MCHC: 32.8 g/dL (ref 31.5–36.0)
MCV: 88.1 fL (ref 79.5–101.0)
MONO#: 0.6 10*3/uL (ref 0.1–0.9)
MONO%: 10.6 % (ref 0.0–14.0)
NEUT#: 2.9 10*3/uL (ref 1.5–6.5)
NEUT%: 50.6 % (ref 38.4–76.8)
Platelets: 176 10*3/uL (ref 145–400)
RBC: 3.7 10*6/uL (ref 3.70–5.45)
RDW: 16.6 % — ABNORMAL HIGH (ref 11.2–14.5)
Retic %: 2.03 % (ref 0.70–2.10)
Retic Ct Abs: 75.11 10*3/uL (ref 33.70–90.70)
WBC: 5.7 10*3/uL (ref 3.9–10.3)

## 2013-09-05 LAB — LACTATE DEHYDROGENASE (CC13): LDH: 231 U/L (ref 125–245)

## 2013-09-05 NOTE — Telephone Encounter (Signed)
Message copied by Jesse Fall on Fri Sep 05, 2013  5:36 PM ------      Message from: Wilmon Arms      Created: Fri Sep 05, 2013  4:25 PM       Pl call pt to take additional tab of kcl tonight ------

## 2013-09-05 NOTE — Telephone Encounter (Signed)
per pof to sch pt appt/printed pt copy of sch

## 2013-09-05 NOTE — Telephone Encounter (Signed)
Notified pt to take additional tab of KCL tonight.  She expressed understanding.  Informed that K+ a little low.  She reports that she will be going out of town & she can be reached on her cell ph # for any further results.

## 2013-09-05 NOTE — Telephone Encounter (Signed)
pt stated will make her own mamma in Oct

## 2013-09-06 NOTE — Progress Notes (Signed)
OFFICE PROGRESS NOTE  CC Dr. 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  #2 stage II adenocarcinoma of the colon diagnosed March 2005.  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  PRIOR THERAPY: As per previously dictated Dr. Laurelyn Sickle note: #1 breast cancer: Patient underwent a mastectomy of the right breast that showed 47 out of 13 lymph nodes positive for metastatic disease. She received 4 cycles of adjuvant Adriamycin and Cytoxan then 3 cycles of Taxol. She subsequently began adjuvant Arimidex from April 2002 to April 2007. Then in 2007 she was switched to Evista 60 mg daily and she completed this in 2013.  #2 colon cancer: Patient was treated with colectomy followed by adjuvant chemotherapy consisting of 5-FU and leucovorin for a duration of 4 cycles.  #3 papillary cell carcinoma of the bladder.  CURRENT THERAPY:Observation for all 3 primaries  INTERVAL HISTORY: [name] [name] 73 y.o. female returns for Followup visit today.The of last visit she had a mammogram performed in 03/03/2013 that shows no evidence of any arm malignancy. She is also up-to-date with her GI followup appointments.  She says that she had  ankle infection that is getting better now. she was found to have CHF. Overall she is doing well she seems to be without any significant complaints. She denies any fevers, chills, night sweats headaches shortness of breath chest pains palpitations she has no myalgias or arthralgias. She was also evaluated by GI for her elevated liver functions.    MEDICAL HISTORY: Past Medical History  Diagnosis Date  . Transaminase or LDH elevation     sees Dr. Zenovia Jarred   . Hiatal hernia   . GERD (gastroesophageal reflux disease)   . Diverticulosis of colon (without mention of  hemorrhage) 09/26/2004,09/10/2006  . Hypertension   . Hyperlipidemia   . Hyperglycemia   . IBS (irritable bowel syndrome)   . Allergy     environmental  . Sleep apnea     sees Dr. Gwenette Greet   . Hepatic steatosis   . Arthritis     sees Dr. Hurley Cisco   . Adenocarcinoma, breast     sees Dr. Marcy Panning   . Bladder cancer   . Malignant neoplasm of colon, unspecified site 07/08/2003  . Diabetes mellitus     sees Dr. Chalmers Cater     ALLERGIES:  has No Known Allergies.  MEDICATIONS:  Current Outpatient Prescriptions  Medication Sig Dispense Refill  . aspirin EC 81 MG tablet Take 81 mg by mouth at bedtime.      . Calcium Carbonate-Vitamin D (CALCIUM + D PO) Take 1 tablet by mouth daily.      . diphenoxylate-atropine (LOMOTIL) 2.5-0.025 MG per tablet Take 1 tablet by mouth daily.       . Fe Fum-FePoly-Vit C-Vit B3 (INTEGRA) 62.5-62.5-40-3 MG CAPS Take iron daily for two months and repeat labs.  30 capsule  1  . fish oil-omega-3 fatty acids 1000 MG capsule Take 1 g by mouth daily.       . furosemide (LASIX) 40 MG tablet Take 1 tablet (40 mg total) by mouth daily.  90 tablet  3  . glucose blood test strip 1 each by Other route as needed. Use as instructed       . Insulin Isophane Human (NOVOLIN N RELION [place]) Inject 65-75 Units into the  skin 2 (two) times daily. She takes 65 units in the morning and 75 units at bedtime.      . metFORMIN (GLUCOPHAGE-XR) 500 MG 24 hr tablet Take 1,000 mg by mouth 2 (two) times daily.      . metoprolol tartrate (LOPRESSOR) 25 MG tablet Take 1 tablet (25 mg total) by mouth 2 (two) times daily.  180 tablet  3  . NOVOLIN R RELION 100 UNIT/ML injection Inject 15 Units into the skin 3 (three) times daily before meals.       Marland Kitchen omeprazole (PRILOSEC) 20 MG capsule Take 1 capsule (20 mg total) by mouth 2 (two) times daily.  60 capsule  0  . ondansetron (ZOFRAN-ODT) 4 MG disintegrating tablet Take 1 tablet (4 mg total) by mouth every 8 (eight) hours as needed for nausea or  vomiting.  20 tablet  3  . PARoxetine (PAXIL) 20 MG tablet Take 10 mg by mouth every morning.       . potassium chloride SA (K-DUR,KLOR-CON) 20 MEQ tablet Take 1 tablet (20 mEq total) by mouth daily.  90 tablet  3  . RELION INSULIN SYRINGE 1ML/31G 31G X 5/16" 1 ML MISC       . rosuvastatin (CRESTOR) 10 MG tablet Take 5 mg by mouth at bedtime.      . sucralfate (CARAFATE) 1 GM/10ML suspension Take 10 mLs (1 g total) by mouth 4 (four) times daily -  with meals and at bedtime.  420 mL  3  . traMADol (ULTRAM) 50 MG tablet Take 1 tablet (50 mg total) by mouth every 4 (four) hours as needed.  60 tablet  2   No current facility-administered medications for this visit.    SURGICAL HISTORY:  Past Surgical History  Procedure Laterality Date  . Sigmoid colectomy 2005      for cancer  . Incisonal hernia    . Mastectomy and biopsy    . Bladder cancer 2001    . Squamous cell ca  r  calf  2008    . Carpal tunnel release    . Esophageal dilation    . Colonoscopy    . Colon surgery  1995    for diverticulitis    REVIEW OF SYSTEMS: Pertinent symptoms were mentioned in interval history   PHYSICAL EXAMINATION: General appearance: alert, cooperative and appears stated age Neck: no adenopathy, no carotid bruit, no JVD, supple, symmetrical, trachea midline and thyroid not enlarged, symmetric, no tenderness/mass/nodules Lymph nodes: Cervical, supraclavicular, and axillary nodes normal. Resp: clear to auscultation bilaterally and normal percussion bilaterally Back: symmetric, no curvature. ROM normal. No CVA tenderness. Cardio: regular rate and rhythm, S1, S2 normal, no murmur, click, rub or gallop GI: soft, non-tender; bowel sounds normal; no masses,  no organomegaly Extremities: extremities normal, atraumatic, no cyanosis or edema Neurologic: Grossly normal Breast examination: Status post right breast mastectomy no nodules felt. left breast no masses felt no bilateral axillary lymphadenopathy  appreciated  ECOG PERFORMANCE STATUS: 1 - Symptomatic but completely ambulatory  Blood pressure 128/75, pulse 86, temperature 98.5 F (36.9 C), temperature source Oral, resp. rate 18, height 5\' 5"  (1.651 m), weight 186 lb 3.2 oz (84.46 kg).  LABORATORY DATA: Lab Results  Component Value Date   WBC 5.7 09/05/2013   HGB 10.7* 09/05/2013   HCT 32.6* 09/05/2013   MCV 88.1 09/05/2013   PLT 176 09/05/2013      Chemistry      Component Value Date/Time   NA 142 09/05/2013 1538  NA 138 07/28/2013 1529   NA 138 01/18/2006 1426   K 3.3* 09/05/2013 1538   K 3.9 07/28/2013 1529   K 4.2 01/18/2006 1426   CL 99 07/28/2013 1529   CL 99 01/18/2006 1426   CO2 21* 09/05/2013 1538   CO2 27 07/28/2013 1529   CO2 29 01/18/2006 1426   BUN 15.5 09/05/2013 1538   BUN 13 07/28/2013 1529   BUN 19 01/18/2006 1426   CREATININE 0.8 09/05/2013 1538   CREATININE 0.9 07/28/2013 1529   CREATININE 0.9 01/18/2006 1426   GLU 194* 07/28/2013 1529      Component Value Date/Time   CALCIUM 10.4 09/05/2013 1538   CALCIUM 10.0 07/28/2013 1529   CALCIUM 10.3 01/18/2006 1426   ALKPHOS 133 09/05/2013 1538   ALKPHOS 115 07/28/2013 1529   AST 45* 09/05/2013 1538   AST 51* 07/28/2013 1529   ALT 26 09/05/2013 1538   ALT 36* 07/28/2013 1529   BILITOT 0.55 09/05/2013 1538   BILITOT 0.8 07/28/2013 1529       RADIOGRAPHIC STUDIES:  No results found.  ASSESSMENT/PLAN: 73 year old female with  #1 Stage III breast cancer, colon cancer and papillary bladder cancer without evidence of recurrent disease: I have asked the patient to follow up with GI for as scheduled followups and lower and upper endoscopies and also urology as scheduled. I have reviewed her mammogram which was performed in October 2014 that was negative for any malignancy. We'll arrange for annual left breast mammogram in October 2015  #2 Fatty liver: Followed by GI  #3 multiple other medical problems she is seen by Dr. Jodi Mourning.  #4 Anemia: Iron panel and ferritin is pending  #5  Hypokalemia: Patient is on KCl 20 mg once daily. I have asked the patient to take one additional dose of KCl today   All questions were answered. The patient knows to call the clinic with any problems, questions or concerns. We can certainly see the patient much sooner if necessary.  I spent 20 minutes counseling the patient face to face. The total time spent in the appointment was 30 minutes.    Wilmon Arms , MD Medical/Oncology [facility] [phone] (Office)  09/06/2013, 7:04 PM

## 2013-09-08 LAB — IRON AND TIBC CHCC
%SAT: 22 % (ref 21–57)
Iron: 102 ug/dL (ref 41–142)
TIBC: 461 ug/dL — ABNORMAL HIGH (ref 236–444)
UIBC: 359 ug/dL (ref 120–384)

## 2013-09-08 LAB — FERRITIN CHCC: FERRITIN: 31 ng/mL (ref 9–269)

## 2013-09-18 ENCOUNTER — Encounter: Payer: Self-pay | Admitting: Internal Medicine

## 2013-09-22 ENCOUNTER — Ambulatory Visit: Payer: Medicare Other | Admitting: Internal Medicine

## 2013-10-06 ENCOUNTER — Telehealth: Payer: Self-pay | Admitting: Family Medicine

## 2013-10-06 ENCOUNTER — Telehealth: Payer: Self-pay | Admitting: Internal Medicine

## 2013-10-06 ENCOUNTER — Other Ambulatory Visit: Payer: Self-pay | Admitting: Gastroenterology

## 2013-10-06 NOTE — Telephone Encounter (Signed)
Patient's husband reports that his wife has had fevers for the last several weeks. He reports they have been at the beach for a "few weeks" she has had fever almost everyday since being there.   They are at night T-max 101.  She currently has a temp of 99.6 this am.  He dies any other complaints except for nausea and some fatigue.  He is asked to have her see her primary care for the above.  They will call back for an appt with APP if they can't see primary care this week.

## 2013-10-06 NOTE — Telephone Encounter (Signed)
NO, she saw Dr. Hilarie Fredrickson who has taken her case over. He gave her a rx previously and noted that he wants to see her before any further refills

## 2013-10-06 NOTE — Telephone Encounter (Signed)
Pt is needing new rx omeprazole (PRILOSEC) 20 MG capsule, pt states dr. Sharlett Iles has retired and pt needs dr. Sarajane Jews to start prescribing it for her. 90 day supply. [facility].

## 2013-10-07 DIAGNOSIS — M76829 Posterior tibial tendinitis, unspecified leg: Secondary | ICD-10-CM | POA: Diagnosis not present

## 2013-10-07 NOTE — Telephone Encounter (Signed)
I spoke with pt and she will call Dr. Hilarie Fredrickson.

## 2013-10-20 ENCOUNTER — Telehealth: Payer: Self-pay | Admitting: Internal Medicine

## 2013-10-20 MED ORDER — SUCRALFATE 1 GM/10ML PO SUSP: 1.0000 g | Freq: Three times a day (TID) | ORAL | Status: DC

## 2013-10-20 NOTE — Telephone Encounter (Signed)
Husband notified They will schedule follow up when they return from  the beach at the end of the season.

## 2013-10-20 NOTE — Telephone Encounter (Signed)
Would continue this med until office followup

## 2013-10-20 NOTE — Telephone Encounter (Signed)
Patient is on her last bottle of carafate.  You started it at her EGD on 4/16.  Please advise if this needs to be a long term medication.

## 2013-10-29 ENCOUNTER — Telehealth: Payer: Self-pay | Admitting: Family Medicine

## 2013-10-29 MED ORDER — DIPHENOXYLATE-ATROPINE 2.5-0.025 MG PO TABS: 1.0000 | ORAL_TABLET | Freq: Every day | ORAL | Status: DC

## 2013-10-29 NOTE — Telephone Encounter (Signed)
Refill request for Lomotil 2.5-0.025 mg take 1 po qd and send to [facility].

## 2013-10-29 NOTE — Telephone Encounter (Signed)
Request to print and fax script to [facility].

## 2013-12-02 DIAGNOSIS — M24676 Ankylosis, unspecified foot: Secondary | ICD-10-CM | POA: Diagnosis not present

## 2013-12-02 DIAGNOSIS — M66369 Spontaneous rupture of flexor tendons, unspecified lower leg: Secondary | ICD-10-CM | POA: Diagnosis not present

## 2013-12-02 DIAGNOSIS — M24673 Ankylosis, unspecified ankle: Secondary | ICD-10-CM | POA: Diagnosis not present

## 2013-12-02 DIAGNOSIS — IMO0002 Reserved for concepts with insufficient information to code with codable children: Secondary | ICD-10-CM | POA: Diagnosis not present

## 2013-12-04 DIAGNOSIS — IMO0001 Reserved for inherently not codable concepts without codable children: Secondary | ICD-10-CM | POA: Diagnosis not present

## 2013-12-04 DIAGNOSIS — E7800 Pure hypercholesterolemia, unspecified: Secondary | ICD-10-CM | POA: Diagnosis not present

## 2013-12-04 DIAGNOSIS — D649 Anemia, unspecified: Secondary | ICD-10-CM | POA: Diagnosis not present

## 2013-12-04 DIAGNOSIS — I10 Essential (primary) hypertension: Secondary | ICD-10-CM | POA: Diagnosis not present

## 2013-12-04 LAB — HEMOGLOBIN A1C: Hgb A1c MFr Bld: 7.9 % — AB (ref 4.0–6.0)

## 2013-12-17 ENCOUNTER — Telehealth: Payer: Self-pay

## 2013-12-17 NOTE — Telephone Encounter (Signed)
Diabetic Bundle- called and LM on pt VM TCB and get her scheduled  A1c, bmet and microalbumin need to be ordered

## 2014-01-05 ENCOUNTER — Other Ambulatory Visit: Payer: Self-pay | Admitting: Family Medicine

## 2014-01-06 DIAGNOSIS — M66369 Spontaneous rupture of flexor tendons, unspecified lower leg: Secondary | ICD-10-CM | POA: Diagnosis not present

## 2014-01-06 DIAGNOSIS — IMO0002 Reserved for concepts with insufficient information to code with codable children: Secondary | ICD-10-CM | POA: Diagnosis not present

## 2014-01-06 DIAGNOSIS — M24673 Ankylosis, unspecified ankle: Secondary | ICD-10-CM | POA: Diagnosis not present

## 2014-01-24 ENCOUNTER — Other Ambulatory Visit: Payer: Self-pay | Admitting: Internal Medicine

## 2014-02-05 ENCOUNTER — Encounter: Payer: Self-pay | Admitting: Nurse Practitioner

## 2014-02-05 ENCOUNTER — Ambulatory Visit (INDEPENDENT_AMBULATORY_CARE_PROVIDER_SITE_OTHER): Payer: Medicare Other | Admitting: Nurse Practitioner

## 2014-02-05 VITALS — BP 112/62 | HR 70 | Ht 65.5 in | Wt 183.0 lb

## 2014-02-05 DIAGNOSIS — R112 Nausea with vomiting, unspecified: Secondary | ICD-10-CM | POA: Diagnosis not present

## 2014-02-05 DIAGNOSIS — I208 Other forms of angina pectoris: Secondary | ICD-10-CM

## 2014-02-05 NOTE — Patient Instructions (Signed)
Please stop Carafate  Keep follow up appointment with Dr. Hilarie Fredrickson for 03-23-2014 at 2:30 pm Please call sooner if you are not feeling better

## 2014-02-05 NOTE — Progress Notes (Addendum)
     History of Present Illness:  Patient is a 73 year old female with multiple medical problems including, but not limited to a history of breast cancer, bladder cancer, and right-sided colon cancer status post ileocecectomy. She has a history of diverticulitis, s/p partial sigmoidectomy. She has a history of GERD and Barrett's esophagus. In addition to above, an ultrasound several months back raised suspicion of cirrhosis. She had an EGD for varices screening in April of this year by Dr. Hilarie Fredrickson who assumed patient's care following Dr. Buel Ream retirement .   Patient comes in today for evaluation of chronic nausea and vomiting. This has actually been present since prior April 2015 EGD. Patient vomits a few times a week for unclear reasons. Emesis usually liquid, not really related to eating but rather random. Patient believes Carafate actually leads to more nausea /vomiting. No associated abdominal pain. She takes baby aspirin but no other NSAIDs. Doesn't take narcotics. Feels blood sugars are fairly well controlled.    Current Medications, Allergies, Past Medical History, Past Surgical History, Family History and Social History were reviewed in [employer] record.   Physical Exam: General: Pleasant, well developed , white female in no acute distress Head: Normocephalic and atraumatic Eyes:  sclerae anicteric, conjunctiva pink  Ears: Normal auditory acuity Lungs: Clear throughout to auscultation Heart: Regular rate and rhythm Abdomen: Soft, non distended, non-tender. No masses, no hepatomegaly. Normal bowel sounds. No succussion splash Musculoskeletal: Symmetrical with no gross deformities  Extremities: No edema  Neurological: Alert oriented x 4, grossly nonfocal Psychological:  Alert and cooperative. Normal mood and affect  Assessment and Recommendations:  47. 73 year old female with chronic (months) intermittent nausea and vomiting. Patient is certain this was  going on at time of March 2015 though there was no documentation of it. At any rate, EGD showed only gastritis / polyps.   Patient believes the carafate is contributing to nausea and vomiting and would like to try stopping it.   Continue Omeprazole.   If discontinuation of Carafate doesn't help then will consider gastric emptying study. Repeat EGD would likely be of low yield since one was already done in setting of same symptoms.   Follow up with myself or Dr. Hilarie Fredrickson in a few weeks.   Continue to use Zofran as needed for nausea  2. Significant GI PMH as listed in PMH. She is up to date on Barrett's surveillance. And also surveillance colonoscopy. If she does have cirrhosis then there is no evidence for decompensation at this time.    Addendum: Reviewed and agree with initial management. Jerene Bears, MD

## 2014-02-06 ENCOUNTER — Other Ambulatory Visit: Payer: Self-pay | Admitting: Family Medicine

## 2014-02-06 NOTE — Telephone Encounter (Signed)
Call in #[phone] with 5 rf 

## 2014-02-07 ENCOUNTER — Encounter: Payer: Self-pay | Admitting: Nurse Practitioner

## 2014-03-10 DIAGNOSIS — M66362 Spontaneous rupture of flexor tendons, left lower leg: Secondary | ICD-10-CM | POA: Diagnosis not present

## 2014-03-10 DIAGNOSIS — M24672 Ankylosis, left ankle: Secondary | ICD-10-CM | POA: Diagnosis not present

## 2014-03-12 ENCOUNTER — Other Ambulatory Visit: Payer: Self-pay

## 2014-03-12 DIAGNOSIS — Z9011 Acquired absence of right breast and nipple: Secondary | ICD-10-CM

## 2014-03-12 DIAGNOSIS — Z1231 Encounter for screening mammogram for malignant neoplasm of breast: Secondary | ICD-10-CM

## 2014-03-20 ENCOUNTER — Encounter: Payer: Self-pay | Admitting: *Deleted

## 2014-03-23 ENCOUNTER — Other Ambulatory Visit (INDEPENDENT_AMBULATORY_CARE_PROVIDER_SITE_OTHER): Payer: Medicare Other

## 2014-03-23 ENCOUNTER — Ambulatory Visit (INDEPENDENT_AMBULATORY_CARE_PROVIDER_SITE_OTHER): Payer: Medicare Other | Admitting: Internal Medicine

## 2014-03-23 ENCOUNTER — Encounter: Payer: Self-pay | Admitting: Internal Medicine

## 2014-03-23 VITALS — BP 110/64 | HR 120 | Ht 65.0 in | Wt 177.8 lb

## 2014-03-23 DIAGNOSIS — K219 Gastro-esophageal reflux disease without esophagitis: Secondary | ICD-10-CM

## 2014-03-23 DIAGNOSIS — D509 Iron deficiency anemia, unspecified: Secondary | ICD-10-CM

## 2014-03-23 DIAGNOSIS — K7469 Other cirrhosis of liver: Secondary | ICD-10-CM

## 2014-03-23 DIAGNOSIS — K7581 Nonalcoholic steatohepatitis (NASH): Secondary | ICD-10-CM | POA: Diagnosis not present

## 2014-03-23 DIAGNOSIS — I208 Other forms of angina pectoris: Secondary | ICD-10-CM

## 2014-03-23 DIAGNOSIS — R195 Other fecal abnormalities: Secondary | ICD-10-CM | POA: Diagnosis not present

## 2014-03-23 DIAGNOSIS — K2270 Barrett's esophagus without dysplasia: Secondary | ICD-10-CM | POA: Diagnosis not present

## 2014-03-23 DIAGNOSIS — D485 Neoplasm of uncertain behavior of skin: Secondary | ICD-10-CM | POA: Diagnosis not present

## 2014-03-23 DIAGNOSIS — D1801 Hemangioma of skin and subcutaneous tissue: Secondary | ICD-10-CM | POA: Diagnosis not present

## 2014-03-23 DIAGNOSIS — Z23 Encounter for immunization: Secondary | ICD-10-CM | POA: Diagnosis not present

## 2014-03-23 DIAGNOSIS — Z85038 Personal history of other malignant neoplasm of large intestine: Secondary | ICD-10-CM

## 2014-03-23 DIAGNOSIS — K7460 Unspecified cirrhosis of liver: Secondary | ICD-10-CM

## 2014-03-23 DIAGNOSIS — L821 Other seborrheic keratosis: Secondary | ICD-10-CM | POA: Diagnosis not present

## 2014-03-23 DIAGNOSIS — D225 Melanocytic nevi of trunk: Secondary | ICD-10-CM | POA: Diagnosis not present

## 2014-03-23 LAB — CBC WITH DIFFERENTIAL/PLATELET
Basophils Absolute: 0 10*3/uL (ref 0.0–0.1)
Basophils Relative: 0.6 % (ref 0.0–3.0)
EOS ABS: 0.1 10*3/uL (ref 0.0–0.7)
Eosinophils Relative: 1.9 % (ref 0.0–5.0)
HCT: 35.5 % — ABNORMAL LOW (ref 36.0–46.0)
Hemoglobin: 11.7 g/dL — ABNORMAL LOW (ref 12.0–15.0)
LYMPHS PCT: 37.7 % (ref 12.0–46.0)
Lymphs Abs: 2.2 10*3/uL (ref 0.7–4.0)
MCHC: 32.9 g/dL (ref 30.0–36.0)
MCV: 93.1 fl (ref 78.0–100.0)
Monocytes Absolute: 0.7 10*3/uL (ref 0.1–1.0)
Monocytes Relative: 11.6 % (ref 3.0–12.0)
NEUTROS PCT: 48.2 % (ref 43.0–77.0)
Neutro Abs: 2.8 10*3/uL (ref 1.4–7.7)
Platelets: 182 10*3/uL (ref 150.0–400.0)
RBC: 3.81 Mil/uL — ABNORMAL LOW (ref 3.87–5.11)
RDW: 15.4 % (ref 11.5–15.5)
WBC: 5.9 10*3/uL (ref 4.0–10.5)

## 2014-03-23 LAB — COMPREHENSIVE METABOLIC PANEL
ALBUMIN: 3.6 g/dL (ref 3.5–5.2)
ALT: 34 U/L (ref 0–35)
AST: 48 U/L — AB (ref 0–37)
Alkaline Phosphatase: 124 U/L — ABNORMAL HIGH (ref 39–117)
BUN: 13 mg/dL (ref 6–23)
CHLORIDE: 104 meq/L (ref 96–112)
CO2: 27 mEq/L (ref 19–32)
Calcium: 9.8 mg/dL (ref 8.4–10.5)
Creatinine, Ser: 0.8 mg/dL (ref 0.4–1.2)
GFR: 71.59 mL/min (ref >60.00)
Glucose, Bld: 80 mg/dL (ref 70–99)
POTASSIUM: 4.1 meq/L (ref 3.5–5.1)
SODIUM: 141 meq/L (ref 135–145)
TOTAL PROTEIN: 7.6 g/dL (ref 6.0–8.3)
Total Bilirubin: 0.8 mg/dL (ref 0.2–1.2)

## 2014-03-23 LAB — IBC PANEL
IRON: 66 ug/dL (ref 42–145)
SATURATION RATIOS: 12.5 % — AB (ref 20.0–50.0)
TRANSFERRIN: 376.9 mg/dL — AB (ref 212.0–360.0)

## 2014-03-23 LAB — PROTIME-INR
INR: 1.2 ratio — ABNORMAL HIGH (ref 0.8–1.0)
Prothrombin Time: 12.9 s (ref 9.6–13.1)

## 2014-03-23 LAB — FERRITIN: FERRITIN: 22.9 ng/mL (ref 10.0–291.0)

## 2014-03-23 NOTE — Progress Notes (Signed)
Subjective:    Patient ID: [name] [name], female    DOB: 1940-07-26, 73 y.o.   MRN: [id number]  HPI [name] [name] is a 73 year old female with a complex past medical history including but not limited to NASH cirrhosis, right-sided colon cancer status post ileocecectomy, history of breast cancer, history of bladder cancer, history of diverticulitis status post partial sigmoidectomy, GERD with Barrett's esophagus who is seen in follow-up. She was recently seen by Tye Savoy, NP on 02/05/2014 to evaluate nausea and vomiting. Carafate was stopped and she reports that her nausea improved significantly. She has taken omeprazole 20 mg twice daily but has had no further nausea or vomiting. She denies abdominal pain or heartburn. She denies increasing abdominal distention. No lower extremity edema. She is having trouble with Achilles tendinitis in her left foot. This is limited her ability to be active and particularly to play golf. She lives in [place] for half of the year. She reports bowel movements have been regular occasionally loose after 2 colon surgeries and she uses Lomotil on occasion. No blood in her stool or melena. His tramadol for arthritis pain. He is taking Lasix 40 mg daily. She has not needed to use further Zofran. She was pre-sitting taking iron supplement but did not renew this once it ran out several months ago.  Her son has a notable history for esophageal cancer and reportedly he is doing poorly due to this.   Review of Systems As per history of present illness, otherwise negative  Current Medications, Allergies, Past Medical History, Past Surgical History, Family History and Social History were reviewed in [employer] record.     Objective:   Physical Exam BP 110/64 mmHg  Pulse 120  Ht 5\' 5"  (1.651 m)  Wt 177 lb 12.8 oz (80.65 kg)  BMI 29.59 kg/m2 Constitutional: Well-developed and well-nourished. No distress. HEENT: Normocephalic and  atraumatic. Oropharynx is clear and moist. No oropharyngeal exudate. Conjunctivae are normal.  No scleral icterus. Neck: Neck supple. Trachea midline. Cardiovascular: Normal rate, regular rhythm and intact distal pulses. No M/R/G Pulmonary/chest: Effort normal and breath sounds normal. No wheezing, rales or rhonchi. Abdominal: Soft,obese, nontender, nondistended. Bowel sounds active throughout.  Extremities: no clubbing, cyanosis, or edema Lymphadenopathy: No cervical adenopathy noted. Neurological: Alert and oriented to person place and time. No asterixis Skin: Skin is warm and dry. No rashes noted. Psychiatric: Normal mood and affect. Behavior is normal.  CBC    Component Value Date/Time   WBC 5.7 09/05/2013 1538   WBC 5.8 07/28/2013 1529   WBC 7.8 01/24/2007 1456   RBC 3.70 09/05/2013 1538   RBC 3.82* 07/28/2013 1529   RBC 4.17 01/24/2007 1456   HGB 10.7* 09/05/2013 1538   HGB 11.0* 07/28/2013 1529   HGB 12.6 01/24/2007 1456   HCT 32.6* 09/05/2013 1538   HCT 33.3* 07/28/2013 1529   HCT 37.7 01/24/2007 1456   PLT 176 09/05/2013 1538   PLT 200.0 07/28/2013 1529   PLT 256 01/24/2007 1456   MCV 88.1 09/05/2013 1538   MCV 87.2 07/28/2013 1529   MCV 90 01/24/2007 1456   MCH 28.9 09/05/2013 1538   MCH 27.4 07/09/2013 0520   MCH 30.3 01/24/2007 1456   MCHC 32.8 09/05/2013 1538   MCHC 32.9 07/28/2013 1529   MCHC 33.5 01/24/2007 1456   RDW 16.6* 09/05/2013 1538   RDW 17.4* 07/28/2013 1529   RDW 11.6 01/24/2007 1456   LYMPHSABS 2.1 09/05/2013 1538   LYMPHSABS 1.7 06/03/2013  1138   LYMPHSABS 2.9 01/24/2007 1456   MONOABS 0.6 09/05/2013 1538   MONOABS 0.6 06/03/2013 1138   EOSABS 0.1 09/05/2013 1538   EOSABS 0.1 06/03/2013 1138   EOSABS 0.2 01/24/2007 1456   BASOSABS 0.0 09/05/2013 1538   BASOSABS 0.0 06/03/2013 1138   BASOSABS 0.1 01/24/2007 1456   CMP     Component Value Date/Time   NA 142 09/05/2013 1538   NA 138 07/28/2013 1529   NA 138 01/18/2006 1426   K 3.3*  09/05/2013 1538   K 3.9 07/28/2013 1529   K 4.2 01/18/2006 1426   CL 99 07/28/2013 1529   CL 99 01/18/2006 1426   CO2 21* 09/05/2013 1538   CO2 27 07/28/2013 1529   CO2 29 01/18/2006 1426   GLUCOSE 106 09/05/2013 1538   GLUCOSE 195* 07/28/2013 1529   GLUCOSE 130* 01/18/2006 1426   BUN 15.5 09/05/2013 1538   BUN 13 07/28/2013 1529   BUN 19 01/18/2006 1426   CREATININE 0.8 09/05/2013 1538   CREATININE 0.9 07/28/2013 1529   CREATININE 0.9 01/18/2006 1426   CALCIUM 10.4 09/05/2013 1538   CALCIUM 10.0 07/28/2013 1529   CALCIUM 10.3 01/18/2006 1426   PROT 8.1 09/05/2013 1538   PROT 7.9 07/28/2013 1529   ALBUMIN 3.4* 09/05/2013 1538   ALBUMIN 3.8 07/28/2013 1529   AST 45* 09/05/2013 1538   AST 51* 07/28/2013 1529   ALT 26 09/05/2013 1538   ALT 36* 07/28/2013 1529   ALKPHOS 133 09/05/2013 1538   ALKPHOS 115 07/28/2013 1529   BILITOT 0.55 09/05/2013 1538   BILITOT 0.8 07/28/2013 1529   GFRNONAA 85* 07/10/2013 0428   GFRAA >90 07/10/2013 0428   Lab Results  Component Value Date   INR 1.4* 07/29/2013   INR >10.0* 07/28/2013   INR 2.7 10/26/2005   PROTIME 20.0* 10/26/2005   PROTIME 19.0 10/17/2005   PROTIME 19.5* 10/10/2005   FibroSure -  Fibrosis Score 0.00 - 0.21  0.78 (H)        Fibrosis Stage  Comment      Comments:           F4 - Cirrhosis    Steatosis Score 0.00 - 0.30  0.92 (H)       Steatosis Grade  Comment      Comments:      S3 - Marked or Severe Steatosis    NASH Score 0.25  0.75       NASH Grade  Comment      Comments:            N2 - NASH    Height Inches 63       Weight LBS 185       ALPHA 2-MACROGLOBULINS, QN 110 - 276 mg/dL 331 (H)       Haptoglobin 34 - 200 mg/dL 94       Apolipoprotein A-1 110 - 205 mg/dL 121       Bilirubin, Total 0.0 - 1.2 mg/dL 0.5       GGT 0 - 60 IU/L 147 (H)       ALT (SGPT) P5P 0 - 40 IU/L 37       AST (SGOT) P5P 0 - 40 IU/L 55 (H)       Cholesterol, Total 100 - 199 mg/dL 167        Glucose 65 - 99 mg/dL 194 (H) 195 (H)R 166 (H)R 210 (H)R    Triglycerides 0 - 149 mg/dL 236 (H)  Abd [place] - Jan 2014 -- reviewed EGD, colonoscopies reviewed     Assessment & Plan:   73 year old female with a complex past medical history including but not limited to NASH cirrhosis, right-sided colon cancer status post ileocecectomy, history of breast cancer, history of bladder cancer, history of diverticulitis status post partial sigmoidectomy, GERD with Barrett's esophagus who is seen in follow-up.  1. NASH cirrhosis -- she was aware of her fatty liver disease but reports not remembering being told about the cirrhosis. We discussed this at length today and her cirrhosis is felt secondary to fatty liver disease. Her liver enzymes are marginally elevated likely related to fatty liver.We discussed the importance of low sodium diet, strictly avoiding all alcohol, and limiting Tylenol to no more than 2 g daily --HCC screening - last January 2015, due now for repeat liver ultrasound, repeat every 6 months --Variceal screening up-to-date, no varices April 2015, repeat April 2018 --Flu shot today --Received a pneumonia vaccine last year --CBC, CMP, INR today --Follow-up in April [place] --Low-fat diet, weight reduction if possible  2. History of Barrett's esophagus -- no Barrett's esophagus seen in April 2015 at endoscopy. She will have variceal surveillance every 3 years and this will also allow for Barrett's surveillance  3. GERD, now resolved nausea with vomiting -- GERD controlled on omeprazole, continue 20 g twice daily. Nausea and vomiting resolved after discontinuation of Carafate  4. History of colon cancer -- surveillance colonoscopy recommended August 2018  5. Chronic loose stools -- when necessary Lomotil.  6. Iron deficiency -- she stopped oral iron supplementation, repeat iron studies today  At least 40 minutes spent during this  encounter, at least half this time was discussing the patient's multiple diagnoses

## 2014-03-23 NOTE — Patient Instructions (Signed)
Your physician has requested that you go to the basement for the following lab work before leaving today: CBC, CMP, INR, IBC, Ferritin  You have been scheduled for an abdominal ultrasound at [facility] (1st floor of hospital) on Friday, 03/27/14 at 8:30 am. Please arrive 15 minutes prior to your appointment for registration. Make certain not to have anything to eat or drink 6 hours prior to your appointment. Should you need to reschedule your appointment, please contact radiology at [phone]. This test typically takes about 30 minutes to perform.  We have given you a flu vaccine today.  Please follow up with Dr Hilarie Fredrickson in April.  CC:Dr Sarajane Jews

## 2014-03-26 ENCOUNTER — Other Ambulatory Visit: Payer: Self-pay | Admitting: Internal Medicine

## 2014-03-27 ENCOUNTER — Ambulatory Visit (HOSPITAL_COMMUNITY)
Admission: RE | Admit: 2014-03-27 | Discharge: 2014-03-27 | Disposition: A | Discharge status: Home / Self Care | Payer: Medicare Other | Source: Ambulatory Visit | Attending: Internal Medicine | Admitting: Internal Medicine

## 2014-03-27 DIAGNOSIS — K829 Disease of gallbladder, unspecified: Secondary | ICD-10-CM | POA: Diagnosis not present

## 2014-03-27 DIAGNOSIS — K7469 Other cirrhosis of liver: Secondary | ICD-10-CM | POA: Insufficient documentation

## 2014-03-27 DIAGNOSIS — K828 Other specified diseases of gallbladder: Secondary | ICD-10-CM | POA: Diagnosis not present

## 2014-03-31 ENCOUNTER — Ambulatory Visit
Admission: RE | Admit: 2014-03-31 | Discharge: 2014-03-31 | Disposition: A | Discharge status: Home / Self Care | Payer: Medicare Other | Source: Ambulatory Visit

## 2014-03-31 DIAGNOSIS — Z9011 Acquired absence of right breast and nipple: Secondary | ICD-10-CM

## 2014-03-31 DIAGNOSIS — Z1231 Encounter for screening mammogram for malignant neoplasm of breast: Secondary | ICD-10-CM | POA: Diagnosis not present

## 2014-04-07 DIAGNOSIS — I10 Essential (primary) hypertension: Secondary | ICD-10-CM | POA: Diagnosis not present

## 2014-04-07 DIAGNOSIS — E1165 Type 2 diabetes mellitus with hyperglycemia: Secondary | ICD-10-CM | POA: Diagnosis not present

## 2014-04-07 DIAGNOSIS — E780 Pure hypercholesterolemia: Secondary | ICD-10-CM | POA: Diagnosis not present

## 2014-05-13 ENCOUNTER — Ambulatory Visit (INDEPENDENT_AMBULATORY_CARE_PROVIDER_SITE_OTHER): Payer: Medicare Other | Admitting: Family Medicine

## 2014-05-13 ENCOUNTER — Encounter: Payer: Self-pay | Admitting: Family Medicine

## 2014-05-13 VITALS — BP 111/68 | HR 85 | Temp 98.8°F | Ht 65.0 in | Wt 177.0 lb

## 2014-05-13 DIAGNOSIS — G4733 Obstructive sleep apnea (adult) (pediatric): Secondary | ICD-10-CM | POA: Diagnosis not present

## 2014-05-13 DIAGNOSIS — G4719 Other hypersomnia: Secondary | ICD-10-CM | POA: Diagnosis not present

## 2014-05-13 MED ORDER — MECLIZINE HCL 25 MG PO TABS: 25.0000 mg | ORAL_TABLET | ORAL | Status: AC | PRN

## 2014-05-13 NOTE — Progress Notes (Signed)
   Subjective:    Patient ID: [name] [name], female    DOB: [birth year], 74 y.o.   MRN: [id number]  HPI Here with her husband to discuss excessive sleepiness. Her husband notes that she spends most of her day on the couch sleeping. She seems to sleep well at night but she has a hx of sleep apnea and is supposed to be using a CPAP machine. However she admits to rarely ever using it. She sees Dr. Gwenette Greet for this but has not seen him in over a year. Otherwise she feels fine, and her CHF seems to be under control. No recent medication changes. She denies any depression sx.    Review of Systems  Constitutional: Negative.   Respiratory: Negative.   Cardiovascular: Negative.   Neurological: Negative.   Psychiatric/Behavioral: Negative.        Objective:   Physical Exam  Constitutional: She is oriented to person, place, and time. She appears well-developed and well-nourished. No distress.  Neck: No thyromegaly present.  Cardiovascular: Normal rate, regular rhythm, normal heart sounds and intact distal pulses.   Pulmonary/Chest: Effort normal and breath sounds normal. No respiratory distress. She has no wheezes. She has no rales.  Musculoskeletal: She exhibits no edema.  Lymphadenopathy:    She has no cervical adenopathy.  Neurological: She is alert and oriented to person, place, and time.  Psychiatric: She has a normal mood and affect. Her behavior is normal. Thought content normal.          Assessment & Plan:  I think it likely that her daytime sleepiness is due to untreated sleep apnea. Advised her to use her CPAP every night and to follow up with Dr. Gwenette Greet soon.

## 2014-05-13 NOTE — Progress Notes (Signed)
Pre visit review using our clinic review tool, if applicable. No additional management support is needed unless otherwise documented below in the visit note. 

## 2014-05-14 ENCOUNTER — Other Ambulatory Visit: Payer: Self-pay | Admitting: Internal Medicine

## 2014-05-15 ENCOUNTER — Other Ambulatory Visit: Payer: Self-pay | Admitting: Internal Medicine

## 2014-05-20 ENCOUNTER — Other Ambulatory Visit: Payer: Self-pay | Admitting: Family Medicine

## 2014-05-21 NOTE — Telephone Encounter (Signed)
Call in #[phone] with 2 rf 

## 2014-06-15 DIAGNOSIS — M545 Low back pain: Secondary | ICD-10-CM | POA: Diagnosis not present

## 2014-06-15 DIAGNOSIS — M5416 Radiculopathy, lumbar region: Secondary | ICD-10-CM | POA: Diagnosis not present

## 2014-06-16 ENCOUNTER — Ambulatory Visit (INDEPENDENT_AMBULATORY_CARE_PROVIDER_SITE_OTHER): Payer: Medicare Other | Admitting: Pulmonary Disease

## 2014-06-16 ENCOUNTER — Other Ambulatory Visit: Payer: Self-pay | Admitting: Rheumatology

## 2014-06-16 ENCOUNTER — Encounter: Payer: Self-pay | Admitting: Pulmonary Disease

## 2014-06-16 VITALS — BP 134/76 | HR 82 | Temp 97.0°F | Ht 65.0 in | Wt 177.6 lb

## 2014-06-16 DIAGNOSIS — G4733 Obstructive sleep apnea (adult) (pediatric): Secondary | ICD-10-CM

## 2014-06-16 DIAGNOSIS — M545 Low back pain: Secondary | ICD-10-CM

## 2014-06-16 NOTE — Assessment & Plan Note (Signed)
The patient has been wearing C Pap, but is struggling with her mask fit and seal. This leads to poor sleep and is having significant daytime sleepiness issues. She also feels that her starting pressure is not high enough. At this point, I would like to refer her to the sleep Center for a formal mask fitting, and will also increase her starting pressure on the automatic range. I would like to get a download from her device in about 4 weeks, to make sure that she is wearing compliantly, and that her apnea is adequately controlled. If she continues to have issues with her C Pap device, I would consider referring her for a dental appliance.

## 2014-06-16 NOTE — Patient Instructions (Signed)
Will have your pressure changed on your device to range from 7-20 Will arrange a fitting session at the sleep center for your mask Work on weight loss Will need to get a download off your device in 4 weeks.  Will send an order to your homecare company for this, but please call us if you do not get this done or if you do not hear from [place] after it is done. followup with me again in one year if you are doing well.

## 2014-06-16 NOTE — Progress Notes (Signed)
   Subjective:    Patient ID: [name] [name], female    DOB: 1940-09-26, 74 y.o.   MRN: [id number]  HPI The patient comes in today for follow-up of her obstructive sleep apnea. She apparently has had issues with her C Pap, and her husband is noting significant inappropriate daytime sleepiness. She is having issues with her mask fit, and will often pull off at night because of discomfort around her nose. She also feels that her pressure does not start high enough.   Review of Systems  Constitutional: Negative for fever and unexpected weight change.  HENT: Negative for congestion, dental problem, ear pain, nosebleeds, postnasal drip, rhinorrhea, sinus pressure, sneezing, sore throat and trouble swallowing.   Eyes: Negative for redness and itching.  Respiratory: Negative for cough, chest tightness, shortness of breath and wheezing.   Cardiovascular: Negative for palpitations and leg swelling.  Gastrointestinal: Negative for nausea and vomiting.  Genitourinary: Negative for dysuria.  Musculoskeletal: Negative for joint swelling.  Skin: Negative for rash.  Neurological: Negative for headaches.  Hematological: Does not bruise/bleed easily.  Psychiatric/Behavioral: Negative for dysphoric mood. The patient is not nervous/anxious.        Objective:   Physical Exam Overweight female in no acute distress Nose without purulence or discharge noted No skin breakdown or pressure necrosis from the C Pap mask Neck without lymphadenopathy or thyromegaly Lower extremities with mild edema, no cyanosis Alert and oriented, moves all 4 extremities.       Assessment & Plan:

## 2014-06-19 ENCOUNTER — Other Ambulatory Visit: Payer: Self-pay | Admitting: Rheumatology

## 2014-06-19 DIAGNOSIS — M5416 Radiculopathy, lumbar region: Secondary | ICD-10-CM

## 2014-06-23 ENCOUNTER — Other Ambulatory Visit (HOSPITAL_BASED_OUTPATIENT_CLINIC_OR_DEPARTMENT_OTHER): Payer: Medicare Other

## 2014-06-29 ENCOUNTER — Ambulatory Visit
Admission: RE | Admit: 2014-06-29 | Discharge: 2014-06-29 | Disposition: A | Discharge status: Home / Self Care | Payer: Medicare Other | Source: Ambulatory Visit | Attending: Rheumatology | Admitting: Rheumatology

## 2014-06-29 DIAGNOSIS — M5416 Radiculopathy, lumbar region: Secondary | ICD-10-CM | POA: Diagnosis not present

## 2014-06-29 MED ORDER — IOHEXOL 180 MG/ML  SOLN
1.0000 mL | Freq: Once | INTRAMUSCULAR | Status: AC | PRN
  Administered 2014-06-29: 1 mL via EPIDURAL

## 2014-06-29 MED ORDER — METHYLPREDNISOLONE ACETATE 40 MG/ML INJ SUSP (RADIOLOG
120.0000 mg | Freq: Once | INTRAMUSCULAR | Status: AC
  Administered 2014-06-29: 120 mg via EPIDURAL

## 2014-06-29 NOTE — Discharge Instructions (Signed)

## 2014-06-30 ENCOUNTER — Other Ambulatory Visit (HOSPITAL_BASED_OUTPATIENT_CLINIC_OR_DEPARTMENT_OTHER): Payer: Medicare Other

## 2014-07-01 ENCOUNTER — Other Ambulatory Visit: Payer: Medicare Other

## 2014-07-06 ENCOUNTER — Other Ambulatory Visit: Payer: Self-pay | Admitting: Internal Medicine

## 2014-07-06 ENCOUNTER — Ambulatory Visit (HOSPITAL_BASED_OUTPATIENT_CLINIC_OR_DEPARTMENT_OTHER): Payer: Medicare Other | Attending: Pulmonary Disease | Admitting: Radiology

## 2014-07-06 DIAGNOSIS — G4733 Obstructive sleep apnea (adult) (pediatric): Secondary | ICD-10-CM

## 2014-07-06 DIAGNOSIS — Z9989 Dependence on other enabling machines and devices: Principal | ICD-10-CM

## 2014-07-14 ENCOUNTER — Other Ambulatory Visit: Payer: Self-pay | Admitting: Cardiovascular Disease

## 2014-07-14 ENCOUNTER — Other Ambulatory Visit: Payer: Self-pay | Admitting: Family Medicine

## 2014-07-14 NOTE — Telephone Encounter (Signed)
Rx(s) sent to pharmacy electronically.  

## 2014-07-24 ENCOUNTER — Encounter: Payer: Self-pay | Admitting: *Deleted

## 2014-07-27 ENCOUNTER — Other Ambulatory Visit: Payer: Self-pay | Admitting: Rheumatology

## 2014-07-27 ENCOUNTER — Telehealth: Payer: Self-pay | Admitting: Pulmonary Disease

## 2014-07-27 DIAGNOSIS — R700 Elevated erythrocyte sedimentation rate: Secondary | ICD-10-CM | POA: Diagnosis not present

## 2014-07-27 DIAGNOSIS — M5441 Lumbago with sciatica, right side: Secondary | ICD-10-CM

## 2014-07-27 DIAGNOSIS — G4733 Obstructive sleep apnea (adult) (pediatric): Secondary | ICD-10-CM

## 2014-07-27 DIAGNOSIS — M5416 Radiculopathy, lumbar region: Secondary | ICD-10-CM | POA: Diagnosis not present

## 2014-07-27 DIAGNOSIS — M545 Low back pain: Secondary | ICD-10-CM | POA: Diagnosis not present

## 2014-07-27 NOTE — Telephone Encounter (Signed)
[id number] x1 

## 2014-07-27 NOTE — Telephone Encounter (Signed)
Please let pt know that her download thru 07/18/2014 shows great control of her sleep apnea, and no significant mask leak issues.  If she wishes to start at a higher pressure, we can send order to dme to change pressure to auto set at 10-20cm. Thanks.

## 2014-07-28 ENCOUNTER — Ambulatory Visit
Admission: RE | Admit: 2014-07-28 | Discharge: 2014-07-28 | Disposition: A | Discharge status: Home / Self Care | Payer: Medicare Other | Source: Ambulatory Visit | Attending: Rheumatology | Admitting: Rheumatology

## 2014-07-28 DIAGNOSIS — M5441 Lumbago with sciatica, right side: Secondary | ICD-10-CM

## 2014-07-28 DIAGNOSIS — M545 Low back pain: Secondary | ICD-10-CM | POA: Diagnosis not present

## 2014-07-28 MED ORDER — METHYLPREDNISOLONE ACETATE 40 MG/ML INJ SUSP (RADIOLOG: 120.0000 mg | Freq: Once | INTRAMUSCULAR | Status: DC

## 2014-07-28 MED ORDER — IOHEXOL 180 MG/ML  SOLN: 1.0000 mL | Freq: Once | INTRAMUSCULAR | Status: AC | PRN

## 2014-07-28 NOTE — Telephone Encounter (Signed)
Pt aware of results. She wants pressure changed. I have sent order. Nothing further needed

## 2014-07-29 ENCOUNTER — Telehealth: Payer: Self-pay | Admitting: Family Medicine

## 2014-07-29 NOTE — Telephone Encounter (Signed)
Pt request refill diphenoxylate-atropine (LOMOTIL) 2.5-0.025 MG per tablet [facility] [name] [name]

## 2014-07-30 MED ORDER — DIPHENOXYLATE-ATROPINE 2.5-0.025 MG PO TABS: 1.0000 | ORAL_TABLET | Freq: Every day | ORAL | Status: DC

## 2014-07-30 NOTE — Telephone Encounter (Signed)
Pt is aware.  

## 2014-07-30 NOTE — Telephone Encounter (Signed)
Call in Lomotil to take bid prn diarrhea, #[phone] with 5 rf

## 2014-07-30 NOTE — Telephone Encounter (Signed)
Rx faxed to [facility] on [address].

## 2014-08-10 ENCOUNTER — Other Ambulatory Visit (INDEPENDENT_AMBULATORY_CARE_PROVIDER_SITE_OTHER): Payer: Medicare Other

## 2014-08-10 ENCOUNTER — Ambulatory Visit (INDEPENDENT_AMBULATORY_CARE_PROVIDER_SITE_OTHER): Payer: Medicare Other | Admitting: Internal Medicine

## 2014-08-10 ENCOUNTER — Encounter: Payer: Self-pay | Admitting: Internal Medicine

## 2014-08-10 VITALS — BP 118/62 | HR 76 | Ht 63.75 in | Wt 183.1 lb

## 2014-08-10 DIAGNOSIS — K7581 Nonalcoholic steatohepatitis (NASH): Secondary | ICD-10-CM

## 2014-08-10 DIAGNOSIS — R195 Other fecal abnormalities: Secondary | ICD-10-CM

## 2014-08-10 DIAGNOSIS — R112 Nausea with vomiting, unspecified: Secondary | ICD-10-CM

## 2014-08-10 DIAGNOSIS — D509 Iron deficiency anemia, unspecified: Secondary | ICD-10-CM

## 2014-08-10 LAB — COMPREHENSIVE METABOLIC PANEL
ALT: 36 U/L — ABNORMAL HIGH (ref 0–35)
AST: 37 U/L (ref 0–37)
Albumin: 3.6 g/dL (ref 3.5–5.2)
Alkaline Phosphatase: 144 U/L — ABNORMAL HIGH (ref 39–117)
BILIRUBIN TOTAL: 1 mg/dL (ref 0.2–1.2)
BUN: 14 mg/dL (ref 6–23)
CALCIUM: 9.8 mg/dL (ref 8.4–10.5)
CO2: 31 meq/L (ref 19–32)
Chloride: 102 mEq/L (ref 96–112)
Creatinine, Ser: 0.8 mg/dL (ref 0.40–1.20)
GFR: 74.62 mL/min (ref >60.00)
Glucose, Bld: 109 mg/dL — ABNORMAL HIGH (ref 70–99)
Potassium: 4 mEq/L (ref 3.5–5.1)
SODIUM: 139 meq/L (ref 135–145)
TOTAL PROTEIN: 7.6 g/dL (ref 6.0–8.3)

## 2014-08-10 LAB — CBC WITH DIFFERENTIAL/PLATELET
Basophils Absolute: 0 10*3/uL (ref 0.0–0.1)
Basophils Relative: 0.5 % (ref 0.0–3.0)
EOS ABS: 0.2 10*3/uL (ref 0.0–0.7)
EOS PCT: 2 % (ref 0.0–5.0)
HEMATOCRIT: 34.8 % — AB (ref 36.0–46.0)
Hemoglobin: 11.9 g/dL — ABNORMAL LOW (ref 12.0–15.0)
LYMPHS ABS: 1.7 10*3/uL (ref 0.7–4.0)
LYMPHS PCT: 23 % (ref 12.0–46.0)
MCHC: 34.2 g/dL (ref 30.0–36.0)
MCV: 90.5 fl (ref 78.0–100.0)
MONO ABS: 0.8 10*3/uL (ref 0.1–1.0)
MONOS PCT: 11.1 % (ref 3.0–12.0)
NEUTROS PCT: 63.4 % (ref 43.0–77.0)
Neutro Abs: 4.8 10*3/uL (ref 1.4–7.7)
Platelets: 177 10*3/uL (ref 150.0–400.0)
RBC: 3.85 Mil/uL — ABNORMAL LOW (ref 3.87–5.11)
RDW: 16.5 % — ABNORMAL HIGH (ref 11.5–15.5)
WBC: 7.5 10*3/uL (ref 4.0–10.5)

## 2014-08-10 LAB — IBC PANEL
Iron: 97 ug/dL (ref 42–145)
SATURATION RATIOS: 18.9 % — AB (ref 20.0–50.0)
TRANSFERRIN: 367 mg/dL — AB (ref 212.0–360.0)

## 2014-08-10 LAB — FERRITIN: Ferritin: 35.9 ng/mL (ref 10.0–291.0)

## 2014-08-10 LAB — PROTIME-INR
INR: 1.1 ratio — ABNORMAL HIGH (ref 0.8–1.0)
PROTHROMBIN TIME: 12.6 s (ref 9.6–13.1)

## 2014-08-10 MED ORDER — DIPHENOXYLATE-ATROPINE 2.5-0.025 MG PO TABS: 1.0000 | ORAL_TABLET | Freq: Three times a day (TID) | ORAL | Status: DC | PRN

## 2014-08-10 MED ORDER — ONDANSETRON 4 MG PO TBDP: ORAL_TABLET | ORAL | Status: DC

## 2014-08-10 NOTE — Patient Instructions (Addendum)
Your physician has requested that you go to the basement for the following lab work before leaving today: IBC, Ferritin, INR, CMET, CBC  Please continue Integra iron supplement.  We have sent the following medications to your pharmacy for you to pick up at your convenience: Zofran Lomotil (increase to 3 times daily as needed)  You have been scheduled for an abdominal ultrasound at [facility] (1st floor of hospital) on Thursday 09/03/14 at 8:30 am. Please arrive 15 minutes prior to your appointment for registration. Make certain not to have anything to eat or drink 6 hours prior to your appointment. Should you need to reschedule your appointment, please contact radiology at [phone]. This test typically takes about 30 minutes to perform.  Please follow up with Dr Hilarie Fredrickson in early November 2016.

## 2014-08-10 NOTE — Progress Notes (Signed)
Subjective:    Patient ID: [name] [name], female    DOB: [birth year], 74 y.o.   MRN: [id number]  HPI [name] [name] is a 74 year old female with a past medical history of [name] cirrhosis, right-sided colon cancer status post ileocecectomy, history of breast cancer, history of bladder cancer, history of diverticulitis status post partial colectomy, GERD with Barrett's esophagus who is seen in follow-up. She is here alone today. She reports overall she is been feeling well. She continues to have episodic nausea with dry heaves. This occurs maybe once every 1-2 weeks. Last for only a few minutes. Zofran has helped significantly with these episodes and she calls Zofran a "miracle medicine". Symptoms can be nocturnal but usually occur during the daytime. Lays down when these occur and they pass usually without incident. Bowel movements continue to be loose and this is been the case since her colon surgery. She uses Lomotil once a day but would like to use it more. She denies confusion. Denies lower extremity edema or abdominal swelling. Denies jaundice. Is getting ready to go to [facility] on May 1 where they live for 6 months a year. Using tramadol for arthritis pain. Tilt taking Lasix 40 mg daily. Taking omeprazole 20 mg twice daily. Denies GERD and dysphagia.   Review of Systems As per history of present illness, otherwise negative  Current Medications, Allergies, Past Medical History, Past Surgical History, Family History and Social History were reviewed in [employer] record.      Objective:   Physical Exam BP 118/62 mmHg  Pulse 76  Ht 5' 3.75" (1.619 m)  Wt 183 lb 2 oz (83.065 kg)  BMI 31.69 kg/m2 Constitutional: Well-developed and well-nourished. No distress. HEENT: Normocephalic and atraumatic. Oropharynx is clear and moist. No oropharyngeal exudate. Conjunctivae are normal.  No scleral icterus. Neck: Neck supple. Trachea midline. Cardiovascular: Normal rate,  regular rhythm and intact distal pulses. No M/R/G Pulmonary/chest: Effort normal and breath sounds normal. No wheezing, rales or rhonchi. Abdominal: Soft, nontender, nondistended. Bowel sounds active throughout. There are no masses palpable. No hepatosplenomegaly. Extremities: no clubbing, cyanosis, or edema Lymphadenopathy: No cervical adenopathy noted. Neurological: Alert and oriented to person place and time. Skin: Skin is warm and dry. No rashes noted. Psychiatric: Normal mood and affect. Behavior is normal.  CBC    Component Value Date/Time   WBC 7.5 08/10/2014 1415   WBC 5.7 09/05/2013 1538   WBC 7.8 01/24/2007 1456   RBC 3.85* 08/10/2014 1415   RBC 3.70 09/05/2013 1538   RBC 4.17 01/24/2007 1456   HGB 11.9* 08/10/2014 1415   HGB 10.7* 09/05/2013 1538   HGB 12.6 01/24/2007 1456   HCT 34.8* 08/10/2014 1415   HCT 32.6* 09/05/2013 1538   HCT 37.7 01/24/2007 1456   PLT 177.0 08/10/2014 1415   PLT 176 09/05/2013 1538   PLT 256 01/24/2007 1456   MCV 90.5 08/10/2014 1415   MCV 88.1 09/05/2013 1538   MCV 90 01/24/2007 1456   MCH 28.9 09/05/2013 1538   MCH 27.4 07/09/2013 0520   MCH 30.3 01/24/2007 1456   MCHC 34.2 08/10/2014 1415   MCHC 32.8 09/05/2013 1538   MCHC 33.5 01/24/2007 1456   RDW 16.5* 08/10/2014 1415   RDW 16.6* 09/05/2013 1538   RDW 11.6 01/24/2007 1456   LYMPHSABS 1.7 08/10/2014 1415   LYMPHSABS 2.1 09/05/2013 1538   LYMPHSABS 2.9 01/24/2007 1456   MONOABS 0.8 08/10/2014 1415   MONOABS 0.6 09/05/2013 1538   EOSABS 0.2 08/10/2014  1415   EOSABS 0.1 09/05/2013 1538   EOSABS 0.2 01/24/2007 1456   BASOSABS 0.0 08/10/2014 1415   BASOSABS 0.0 09/05/2013 1538   BASOSABS 0.1 01/24/2007 1456    CMP     Component Value Date/Time   NA 139 08/10/2014 1415   NA 142 09/05/2013 1538   NA 138 01/18/2006 1426   K 4.0 08/10/2014 1415   K 3.3* 09/05/2013 1538   K 4.2 01/18/2006 1426   CL 102 08/10/2014 1415   CL 99 01/18/2006 1426   CO2 31 08/10/2014 1415   CO2  21* 09/05/2013 1538   CO2 29 01/18/2006 1426   GLUCOSE 109* 08/10/2014 1415   GLUCOSE 106 09/05/2013 1538   GLUCOSE 130* 01/18/2006 1426   BUN 14 08/10/2014 1415   BUN 15.5 09/05/2013 1538   BUN 19 01/18/2006 1426   CREATININE 0.80 08/10/2014 1415   CREATININE 0.8 09/05/2013 1538   CREATININE 0.9 01/18/2006 1426   CALCIUM 9.8 08/10/2014 1415   CALCIUM 10.4 09/05/2013 1538   CALCIUM 10.3 01/18/2006 1426   PROT 7.6 08/10/2014 1415   PROT 8.1 09/05/2013 1538   ALBUMIN 3.6 08/10/2014 1415   ALBUMIN 3.4* 09/05/2013 1538   AST 37 08/10/2014 1415   AST 45* 09/05/2013 1538   ALT 36* 08/10/2014 1415   ALT 26 09/05/2013 1538   ALKPHOS 144* 08/10/2014 1415   ALKPHOS 133 09/05/2013 1538   BILITOT 1.0 08/10/2014 1415   BILITOT 0.55 09/05/2013 1538   GFRNONAA 85* 07/10/2013 0428   GFRAA >90 07/10/2013 0428    Lab Results  Component Value Date   INR 1.1* 08/10/2014   INR 1.2* 03/23/2014   INR 1.4* 07/29/2013   PROTIME 20.0* 10/26/2005   PROTIME 19.0 10/17/2005   PROTIME 19.5* 10/10/2005   abd [place] Nov 2015 reviewed    Assessment & Plan:  74 year old female with a past medical history of [name] cirrhosis, right-sided colon cancer status post ileocecectomy, history of breast cancer, history of bladder cancer, history of diverticulitis status post partial colectomy, GERD with Barrett's esophagus who is seen in follow-up.  1. [name] cirrhosis -- well compensated to this point. Marginally elevated liver enzymes felt secondary to fatty liver disease. Again discussed the importance of low sodium diet. --HCC screening, negative ultrasound of November 2015. Repeat later this month before she leaves town for 6 months. Interval every 6 months --Annual flu vaccine, has had Pneumovax --There seal screening up-to-date, no varices April 2015, repeat April 2018 --Low-fat diet, exercise and possible  2. Nausea with vomiting -- episodic and much better than previously. She did have gastritis on endoscopy  but no H. pylori. Also has biliary sludge so I cannot exclude this as a possible etiology. Zofran seems to be helping and minimizes attacks. Continue Zofran on an as-needed basis for now. Call if worsening  3. History of Barrett's -- no Barrett's seen April 2015, repeat in 3 years  4. Chronic loose stools -- increase Lomotil to 3 times a day when necessary  5. History of colon cancer -- surveillance colonoscopy recommended August 2018

## 2014-09-03 ENCOUNTER — Ambulatory Visit (HOSPITAL_COMMUNITY)
Admission: RE | Admit: 2014-09-03 | Discharge: 2014-09-03 | Disposition: A | Discharge status: Home / Self Care | Payer: Medicare Other | Source: Ambulatory Visit | Attending: Internal Medicine | Admitting: Internal Medicine

## 2014-09-03 DIAGNOSIS — K7460 Unspecified cirrhosis of liver: Secondary | ICD-10-CM | POA: Diagnosis not present

## 2014-09-03 DIAGNOSIS — R195 Other fecal abnormalities: Secondary | ICD-10-CM

## 2014-09-03 DIAGNOSIS — D509 Iron deficiency anemia, unspecified: Secondary | ICD-10-CM

## 2014-09-03 DIAGNOSIS — R7989 Other specified abnormal findings of blood chemistry: Secondary | ICD-10-CM | POA: Insufficient documentation

## 2014-09-03 DIAGNOSIS — Z8551 Personal history of malignant neoplasm of bladder: Secondary | ICD-10-CM | POA: Diagnosis not present

## 2014-09-03 DIAGNOSIS — K7581 Nonalcoholic steatohepatitis (NASH): Secondary | ICD-10-CM

## 2014-09-03 DIAGNOSIS — Z853 Personal history of malignant neoplasm of breast: Secondary | ICD-10-CM | POA: Diagnosis not present

## 2014-09-04 ENCOUNTER — Other Ambulatory Visit: Payer: Self-pay | Admitting: *Deleted

## 2014-09-04 DIAGNOSIS — C50919 Malignant neoplasm of unspecified site of unspecified female breast: Secondary | ICD-10-CM

## 2014-09-06 NOTE — Assessment & Plan Note (Signed)
Rt Breast mastectomy: June 2001;  13/17 LN Positive She received 4 cycles of adjuvant Adriamycin and Cytoxan then 3 cycles of Taxol. She subsequently began adjuvant Arimidex from April 2002 to April 2007. Then in 2007 she was switched to Evista 60 mg daily and she completed this in 2013.  Colon cancer March 2005: Patient was treated with colectomy followed by adjuvant chemotherapy consisting of 5-FU and leucovorin for a duration of 4 cycles.  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

## 2014-09-07 ENCOUNTER — Ambulatory Visit (HOSPITAL_BASED_OUTPATIENT_CLINIC_OR_DEPARTMENT_OTHER): Payer: Medicare Other | Admitting: Hematology and Oncology

## 2014-09-07 ENCOUNTER — Other Ambulatory Visit (HOSPITAL_BASED_OUTPATIENT_CLINIC_OR_DEPARTMENT_OTHER): Payer: Medicare Other

## 2014-09-07 ENCOUNTER — Other Ambulatory Visit: Payer: Self-pay | Admitting: Obstetrics and Gynecology

## 2014-09-07 ENCOUNTER — Telehealth: Payer: Self-pay | Admitting: Hematology and Oncology

## 2014-09-07 VITALS — BP 115/80 | HR 89 | Temp 98.2°F | Resp 18 | Ht 63.75 in | Wt 184.6 lb

## 2014-09-07 DIAGNOSIS — Z6831 Body mass index (BMI) 31.0-31.9, adult: Secondary | ICD-10-CM | POA: Diagnosis not present

## 2014-09-07 DIAGNOSIS — M5416 Radiculopathy, lumbar region: Secondary | ICD-10-CM | POA: Diagnosis not present

## 2014-09-07 DIAGNOSIS — Z779 Other contact with and (suspected) exposures hazardous to health: Secondary | ICD-10-CM | POA: Diagnosis not present

## 2014-09-07 DIAGNOSIS — Z85038 Personal history of other malignant neoplasm of large intestine: Secondary | ICD-10-CM | POA: Diagnosis not present

## 2014-09-07 DIAGNOSIS — Z79899 Other long term (current) drug therapy: Secondary | ICD-10-CM | POA: Diagnosis not present

## 2014-09-07 DIAGNOSIS — Z124 Encounter for screening for malignant neoplasm of cervix: Secondary | ICD-10-CM | POA: Diagnosis not present

## 2014-09-07 DIAGNOSIS — Z79810 Long term (current) use of selective estrogen receptor modulators (SERMs): Secondary | ICD-10-CM

## 2014-09-07 DIAGNOSIS — Z853 Personal history of malignant neoplasm of breast: Secondary | ICD-10-CM

## 2014-09-07 DIAGNOSIS — C50111 Malignant neoplasm of central portion of right female breast: Secondary | ICD-10-CM

## 2014-09-07 DIAGNOSIS — R700 Elevated erythrocyte sedimentation rate: Secondary | ICD-10-CM | POA: Diagnosis not present

## 2014-09-07 DIAGNOSIS — C50919 Malignant neoplasm of unspecified site of unspecified female breast: Secondary | ICD-10-CM

## 2014-09-07 DIAGNOSIS — R109 Unspecified abdominal pain: Secondary | ICD-10-CM | POA: Diagnosis not present

## 2014-09-07 LAB — CBC WITH DIFFERENTIAL/PLATELET
BASO%: 0.5 % (ref 0.0–2.0)
BASOS ABS: 0 10*3/uL (ref 0.0–0.1)
EOS%: 1.5 % (ref 0.0–7.0)
Eosinophils Absolute: 0.1 10*3/uL (ref 0.0–0.5)
HEMATOCRIT: 33.9 % — AB (ref 34.8–46.6)
HGB: 11.2 g/dL — ABNORMAL LOW (ref 11.6–15.9)
LYMPH%: 30.1 % (ref 14.0–49.7)
MCH: 31.2 pg (ref 25.1–34.0)
MCHC: 33 g/dL (ref 31.5–36.0)
MCV: 94.4 fL (ref 79.5–101.0)
MONO#: 0.8 10*3/uL (ref 0.1–0.9)
MONO%: 13 % (ref 0.0–14.0)
NEUT#: 3.4 10*3/uL (ref 1.5–6.5)
NEUT%: 54.9 % (ref 38.4–76.8)
Platelets: 163 10*3/uL (ref 145–400)
RBC: 3.59 10*6/uL — AB (ref 3.70–5.45)
RDW: 16.2 % — AB (ref 11.2–14.5)
WBC: 6.2 10*3/uL (ref 3.9–10.3)
lymph#: 1.9 10*3/uL (ref 0.9–3.3)

## 2014-09-07 LAB — COMPREHENSIVE METABOLIC PANEL (CC13)
ALBUMIN: 3.2 g/dL — AB (ref 3.5–5.0)
ALT: 25 U/L (ref 0–55)
AST: 37 U/L — ABNORMAL HIGH (ref 5–34)
Alkaline Phosphatase: 113 U/L (ref 40–150)
Anion Gap: 13 mEq/L — ABNORMAL HIGH (ref 3–11)
BUN: 14.2 mg/dL (ref 7.0–26.0)
CALCIUM: 9.6 mg/dL (ref 8.4–10.4)
CHLORIDE: 108 meq/L (ref 98–109)
CO2: 20 meq/L — AB (ref 22–29)
Creatinine: 0.8 mg/dL (ref 0.6–1.1)
EGFR: 79 mL/min/{1.73_m2} — ABNORMAL LOW (ref >90)
Glucose: 116 mg/dl (ref 70–140)
Potassium: 3.8 mEq/L (ref 3.5–5.1)
SODIUM: 141 meq/L (ref 136–145)
Total Bilirubin: 0.93 mg/dL (ref 0.20–1.20)
Total Protein: 7.1 g/dL (ref 6.4–8.3)

## 2014-09-07 NOTE — Telephone Encounter (Signed)
Appointments made and avs printed for patient °

## 2014-09-07 NOTE — Progress Notes (Signed)
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  #2 stage II adenocarcinoma of the colon diagnosed March 2005.  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  PRIOR THERAPY:  #1 breast cancer: Patient underwent a mastectomy of the right breast that showed 17 out of 13 lymph nodes positive for metastatic disease. She received 4 cycles of adjuvant Adriamycin and Cytoxan then 3 cycles of Taxol. She subsequently began adjuvant Arimidex from April 2002 to April 2007. Then in 2007 she was switched to Evista 60 mg daily and she completed this in 2013.  #2 colon cancer: Patient was treated with colectomy followed by adjuvant chemotherapy consisting of 5-FU and leucovorin for a duration of 4 cycles.  #3 papillary cell carcinoma of the bladder.  CURRENT THERAPY:Observation for all 3 primaries  INTERVAL HISTORY: [name] [name] is a 74 year old lady with above-mentioned history of breast cancer colon cancer and bladder cancer who is here for annual follow-up she reports no new problems or concerns. She had a diagnosis of cirrhosis the liver and had a recent ultrasound which showed a pancreatic abnormality 1.8 cm in size. She also has some evidence of some syndesmosis as well as a left kidney lesion which has been stable from before. She has an appointment to see her gastroenterologist and she plans to discuss this with him.  REVIEW OF SYSTEMS:   Constitutional: Denies fevers, chills or abnormal weight loss Eyes: Denies blurriness of vision Ears, nose, mouth, throat, and face: Denies mucositis or sore throat Respiratory: Denies cough, dyspnea or wheezes Cardiovascular: Denies palpitation, chest discomfort or lower extremity swelling Gastrointestinal:  Denies nausea, heartburn or change in bowel habits Skin: Denies abnormal skin  rashes Lymphatics: Denies new lymphadenopathy or easy bruising Neurological:Denies numbness, tingling or new weaknesses Behavioral/Psych: Mood is stable, no new changes  Breast:  denies any pain or lumps or nodules in either breasts All other systems were reviewed with the patient and are negative.  I have reviewed the past medical history, past surgical history, social history and family history with the patient and they are unchanged from previous note.  ALLERGIES:  has No Known Allergies.  MEDICATIONS:  Current Outpatient Prescriptions  Medication Sig Dispense Refill  . aspirin EC 81 MG tablet Take 81 mg by mouth at bedtime.    . Calcium Carbonate-Vitamin D (CALCIUM + D PO) Take 1 tablet by mouth daily.    . CRESTOR 10 MG tablet TAKE 1/2 TABLET EVERY DAY 45 tablet 0  . diphenoxylate-atropine (LOMOTIL) 2.5-0.025 MG per tablet Take 1 tablet by mouth 3 (three) times daily as needed for diarrhea or loose stools. 270 tablet 1  . Fe Fum-FePoly-Vit C-Vit B3 (INTEGRA) 62.5-62.5-40-3 MG CAPS TAKE ONE CAPSULE BY MOUTH ONE TIME DAILY  30 capsule 1  . fish oil-omega-3 fatty acids 1000 MG capsule Take 1 g by mouth daily.     . furosemide (LASIX) 40 MG tablet Take 1 tablet (40 mg total) by mouth daily. NEEDS APPOINTMENT FOR FUTURE REFILLS 90 tablet 0  . glucose blood test strip 1 each by Other route as needed. Use as instructed     . Insulin Isophane Human (NOVOLIN N RELION [place]) Inject 65-75 Units into the skin 2 (two) times daily. She takes 75 units in the morning and 75 units at bedtime.    . meclizine (ANTIVERT) 25 MG tablet Take  1 tablet (25 mg total) by mouth every 4 (four) hours as needed for dizziness. 60 tablet 5  . metFORMIN (GLUCOPHAGE-XR) 500 MG 24 hr tablet Take 1,000 mg by mouth 2 (two) times daily.    . metoprolol tartrate (LOPRESSOR) 25 MG tablet Take 1 tablet (25 mg total) by mouth 2 (two) times daily. NEEDS APPOINTMENT FOR FUTURE REFILLS 180 tablet 0  . nitroGLYCERIN (NITRODUR - DOSED  IN MG/24 HR) 0.2 mg/hr patch     . omeprazole (PRILOSEC) 20 MG capsule Take 1 capsule (20 mg total) by mouth 2 (two) times daily before a meal. 180 capsule 1  . ondansetron (ZOFRAN-ODT) 4 MG disintegrating tablet Dissolve one tablet in mouth every eight hours as needed for nausea or vomiting. 20 tablet 10  . PARoxetine (PAXIL) 20 MG tablet Take 10 mg by mouth every morning.     . potassium chloride SA (K-DUR,KLOR-CON) 20 MEQ tablet Take 1 tablet (20 mEq total) by mouth daily. NEEDS APPOINTMENT FOR FUTURE REFILLS 90 tablet 0  . RELION INSULIN SYRINGE 1ML/31G 31G X 5/16" 1 ML MISC     . traMADol (ULTRAM) 50 MG tablet TAKE ONE TABLET BY MOUTH EVERY FOUR HOURS AS NEEDED  120 tablet 2   No current facility-administered medications for this visit.    PHYSICAL EXAMINATION: ECOG PERFORMANCE STATUS: 0 - Asymptomatic  There were no vitals filed for this visit. There were no vitals filed for this visit.  GENERAL:alert, no distress and comfortable SKIN: skin color, texture, turgor are normal, no rashes or significant lesions EYES: normal, Conjunctiva are pink and non-injected, sclera clear OROPHARYNX:no exudate, no erythema and lips, buccal mucosa, and tongue normal  NECK: supple, thyroid normal size, non-tender, without nodularity LYMPH:  no palpable lymphadenopathy in the cervical, axillary or inguinal LUNGS: clear to auscultation and percussion with normal breathing effort HEART: regular rate & rhythm and no murmurs and no lower extremity edema ABDOMEN:abdomen soft, non-tender and normal bowel sounds Musculoskeletal:no cyanosis of digits and no clubbing  NEURO: alert & oriented x 3 with fluent speech, no focal motor/sensory deficits BREAST: Right sided mastectomy no palpable lesions left side abnormalities (exam performed in the presence of a chaperone)  LABORATORY DATA:  I have reviewed the data as listed   Chemistry      Component Value Date/Time   NA 139 08/10/2014 1415   NA 142  09/05/2013 1538   NA 138 01/18/2006 1426   K 4.0 08/10/2014 1415   K 3.3* 09/05/2013 1538   K 4.2 01/18/2006 1426   CL 102 08/10/2014 1415   CL 99 01/18/2006 1426   CO2 31 08/10/2014 1415   CO2 21* 09/05/2013 1538   CO2 29 01/18/2006 1426   BUN 14 08/10/2014 1415   BUN 15.5 09/05/2013 1538   BUN 19 01/18/2006 1426   CREATININE 0.80 08/10/2014 1415   CREATININE 0.8 09/05/2013 1538   CREATININE 0.9 01/18/2006 1426   GLU 194* 07/28/2013 1529      Component Value Date/Time   CALCIUM 9.8 08/10/2014 1415   CALCIUM 10.4 09/05/2013 1538   CALCIUM 10.3 01/18/2006 1426   ALKPHOS 144* 08/10/2014 1415   ALKPHOS 133 09/05/2013 1538   AST 37 08/10/2014 1415   AST 45* 09/05/2013 1538   ALT 36* 08/10/2014 1415   ALT 26 09/05/2013 1538   BILITOT 1.0 08/10/2014 1415   BILITOT 0.55 09/05/2013 1538       Lab Results  Component Value Date   WBC 7.5 08/10/2014   HGB  11.9* 08/10/2014   HCT 34.8* 08/10/2014   MCV 90.5 08/10/2014   PLT 177.0 08/10/2014   NEUTROABS 4.8 08/10/2014    ASSESSMENT & PLAN:  Malignant neoplasm of central portion of right female breast Rt Breast mastectomy: June 2001;  13/17 LN Positive She received 4 cycles of adjuvant Adriamycin and Cytoxan then 3 cycles of Taxol. She subsequently began adjuvant Arimidex from April 2002 to April 2007. Then in 2007 she was switched to Evista 60 mg daily and she completed this in 2013.  Colon cancer March 2005: Patient was treated with colectomy followed by adjuvant chemotherapy consisting of 5-FU and leucovorin for a duration of 4 cycles.  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  Pancreatic lesion: Uncertain significance. Patient was seeing her gastroenterologist. I discussed with her that there are 2 options one would be to close surveillance as well as biopsy. She plans to discuss this with her gastroenterologist.  No orders of the defined types were placed in this encounter.    The patient has a good understanding of the overall plan. she agrees with it. she will call with any problems that may develop before the next visit here.   Rulon Eisenmenger, MD

## 2014-09-08 LAB — CYTOLOGY - PAP

## 2014-09-16 ENCOUNTER — Other Ambulatory Visit: Payer: Self-pay | Admitting: Internal Medicine

## 2014-09-17 ENCOUNTER — Telehealth: Payer: Self-pay | Admitting: Internal Medicine

## 2014-09-17 MED ORDER — INTEGRA 62.5-62.5-40-3 MG PO CAPS: 1.0000 | ORAL_CAPSULE | Freq: Every day | ORAL | Status: DC

## 2014-09-17 NOTE — Telephone Encounter (Signed)
Patient at [facility] for 1 month. Advised that I sent script this morning for iron to [facility] at [address] per electronic refill request. I will send 1 month rx to [facility] and further refills should come from [facility] as previously sent.

## 2014-10-12 DIAGNOSIS — M5414 Radiculopathy, thoracic region: Secondary | ICD-10-CM | POA: Diagnosis not present

## 2014-10-12 DIAGNOSIS — I10 Essential (primary) hypertension: Secondary | ICD-10-CM | POA: Diagnosis not present

## 2014-10-12 DIAGNOSIS — D649 Anemia, unspecified: Secondary | ICD-10-CM | POA: Diagnosis not present

## 2014-10-12 DIAGNOSIS — E1165 Type 2 diabetes mellitus with hyperglycemia: Secondary | ICD-10-CM | POA: Diagnosis not present

## 2014-10-12 DIAGNOSIS — M4316 Spondylolisthesis, lumbar region: Secondary | ICD-10-CM | POA: Diagnosis not present

## 2014-10-12 DIAGNOSIS — R5383 Other fatigue: Secondary | ICD-10-CM | POA: Diagnosis not present

## 2014-10-12 DIAGNOSIS — Z79899 Other long term (current) drug therapy: Secondary | ICD-10-CM | POA: Diagnosis not present

## 2014-10-12 DIAGNOSIS — E780 Pure hypercholesterolemia: Secondary | ICD-10-CM | POA: Diagnosis not present

## 2014-10-27 ENCOUNTER — Other Ambulatory Visit: Payer: Self-pay | Admitting: Internal Medicine

## 2014-10-27 ENCOUNTER — Other Ambulatory Visit: Payer: Self-pay | Admitting: Cardiovascular Disease

## 2014-10-27 ENCOUNTER — Other Ambulatory Visit: Payer: Self-pay | Admitting: Family Medicine

## 2014-10-27 NOTE — Telephone Encounter (Signed)
Rx(s) sent to pharmacy electronically.  

## 2014-10-27 NOTE — Telephone Encounter (Signed)
Pt is out town until 1st of Willa Rough is living at [facility]. She needs her medicine until she gets back in November.Pt needs her Potassium,Furosemide and Metoprolol. These need to be sent to [facility] please.

## 2014-11-04 ENCOUNTER — Encounter: Payer: Self-pay | Admitting: Gastroenterology

## 2014-12-25 ENCOUNTER — Other Ambulatory Visit: Payer: Self-pay | Admitting: Internal Medicine

## 2015-01-20 ENCOUNTER — Other Ambulatory Visit: Payer: Self-pay | Admitting: Gastroenterology

## 2015-02-08 ENCOUNTER — Other Ambulatory Visit: Payer: Self-pay | Admitting: Internal Medicine

## 2015-02-27 DIAGNOSIS — Z23 Encounter for immunization: Secondary | ICD-10-CM | POA: Diagnosis not present

## 2015-03-10 ENCOUNTER — Telehealth: Payer: Self-pay

## 2015-03-10 ENCOUNTER — Other Ambulatory Visit: Payer: Self-pay | Admitting: Family Medicine

## 2015-03-10 NOTE — Telephone Encounter (Signed)
Pt requesting refill on Tramadol 50mg .   Last pt visit: 05/13/14 and last   Rx refill:05/11/14 #120 with 2 additional refill   Also pt request refill at [facility] but they never filled that prescription there.   PLEASE ADVISE

## 2015-03-11 MED ORDER — TRAMADOL HCL 50 MG PO TABS: 50.0000 mg | ORAL_TABLET | Freq: Four times a day (QID) | ORAL | Status: DC | PRN

## 2015-03-11 NOTE — Telephone Encounter (Signed)
Call in #120 with 5 rf 

## 2015-03-11 NOTE — Telephone Encounter (Signed)
Rx was sent to [facility]

## 2015-03-16 ENCOUNTER — Telehealth: Payer: Self-pay

## 2015-03-16 ENCOUNTER — Other Ambulatory Visit: Payer: Self-pay

## 2015-03-16 DIAGNOSIS — K7581 Nonalcoholic steatohepatitis (NASH): Secondary | ICD-10-CM

## 2015-03-16 NOTE — Telephone Encounter (Signed)
Pt scheduled for [place] of abd at [facility] 03/23/15@9 :30am. Pt to arrive there at 9:15am. Pt to be NPO after midnight. Spoke with pt and she is aware.

## 2015-03-23 ENCOUNTER — Ambulatory Visit (HOSPITAL_COMMUNITY)
Admission: RE | Admit: 2015-03-23 | Discharge: 2015-03-23 | Disposition: A | Discharge status: Home / Self Care | Payer: Medicare Other | Source: Ambulatory Visit | Attending: Internal Medicine | Admitting: Internal Medicine

## 2015-03-23 DIAGNOSIS — K7460 Unspecified cirrhosis of liver: Secondary | ICD-10-CM | POA: Diagnosis not present

## 2015-03-23 DIAGNOSIS — K7581 Nonalcoholic steatohepatitis (NASH): Secondary | ICD-10-CM

## 2015-04-05 DIAGNOSIS — E1165 Type 2 diabetes mellitus with hyperglycemia: Secondary | ICD-10-CM | POA: Diagnosis not present

## 2015-04-05 DIAGNOSIS — D649 Anemia, unspecified: Secondary | ICD-10-CM | POA: Diagnosis not present

## 2015-04-12 DIAGNOSIS — E1165 Type 2 diabetes mellitus with hyperglycemia: Secondary | ICD-10-CM | POA: Diagnosis not present

## 2015-04-12 DIAGNOSIS — E7800 Pure hypercholesterolemia, unspecified: Secondary | ICD-10-CM | POA: Diagnosis not present

## 2015-04-12 DIAGNOSIS — I10 Essential (primary) hypertension: Secondary | ICD-10-CM | POA: Diagnosis not present

## 2015-04-19 DIAGNOSIS — L820 Inflamed seborrheic keratosis: Secondary | ICD-10-CM | POA: Diagnosis not present

## 2015-04-19 DIAGNOSIS — D1801 Hemangioma of skin and subcutaneous tissue: Secondary | ICD-10-CM | POA: Diagnosis not present

## 2015-04-19 DIAGNOSIS — L821 Other seborrheic keratosis: Secondary | ICD-10-CM | POA: Diagnosis not present

## 2015-04-21 ENCOUNTER — Encounter: Payer: Self-pay | Admitting: Family Medicine

## 2015-04-21 ENCOUNTER — Ambulatory Visit (INDEPENDENT_AMBULATORY_CARE_PROVIDER_SITE_OTHER): Payer: Medicare Other | Admitting: Family Medicine

## 2015-04-21 VITALS — BP 117/71 | HR 78 | Temp 99.3°F | Ht 63.75 in | Wt 180.0 lb

## 2015-04-21 DIAGNOSIS — R413 Other amnesia: Secondary | ICD-10-CM | POA: Diagnosis not present

## 2015-04-21 MED ORDER — DONEPEZIL HCL 5 MG PO TABS: 5.0000 mg | ORAL_TABLET | Freq: Every day | ORAL | Status: DC

## 2015-04-21 NOTE — Progress Notes (Signed)
   Subjective:    Patient ID: [name] [name], female    DOB: [birth year], 74 y.o.   MRN: [id number]  HPI Here with her husband to discuss possible memory problems and hypersomnia. She has had a tendency to sleep a lot for years, and she does have known sleep apnea. She wears a CPAP every night, but apparently she naps extensively during the day as well. She has not noticed a memory issue, but the family has seen this for at least a year, and her husband says it is getting worse. She often confuses the grandchildren's names, and the other day when her husband asked her about one of the grandchildren she did not recognize the name at all. She often stops in the middle of a conversation and says she cannot remember what she was talking about. She often has trouble finding the words she wants to say during a conversation. They both deny any recent change in moods or personality. She had normal lab work in May.    Review of Systems  Constitutional: Negative.   Respiratory: Negative.   Cardiovascular: Negative.   Neurological: Positive for speech difficulty. Negative for dizziness, tremors, seizures, syncope, facial asymmetry, weakness, light-headedness, numbness and headaches.  Psychiatric/Behavioral: Positive for confusion. Negative for suicidal ideas, hallucinations, behavioral problems, self-injury, dysphoric mood, decreased concentration and agitation. The patient is not nervous/anxious.        Objective:   Physical Exam  Constitutional: She is oriented to person, place, and time. She appears well-developed and well-nourished.  Cardiovascular: Normal rate, regular rhythm, normal heart sounds and intact distal pulses.   Pulmonary/Chest: Effort normal and breath sounds normal.  Neurological: She is alert and oriented to person, place, and time. No cranial nerve deficit. She exhibits normal muscle tone. Coordination normal.  Psychiatric: She has a normal mood and affect. Her behavior is normal.  Judgment and thought content normal.          Assessment & Plan:  She has had progressive ( though still mild) decrease in cognition and memory function, and she certainly could be showing an early dementia. We agreed to try Aricept 5 mg daily. We will meet again in one month. We spent 35 minutes today discussing these things.

## 2015-04-21 NOTE — Progress Notes (Signed)
Pre visit review using our clinic review tool, if applicable. No additional management support is needed unless otherwise documented below in the visit note. 

## 2015-05-01 ENCOUNTER — Other Ambulatory Visit: Payer: Self-pay | Admitting: Cardiovascular Disease

## 2015-05-01 ENCOUNTER — Other Ambulatory Visit: Payer: Self-pay | Admitting: Internal Medicine

## 2015-05-17 ENCOUNTER — Telehealth: Payer: Self-pay | Admitting: Cardiovascular Disease

## 2015-05-17 NOTE — Telephone Encounter (Signed)
°*  STAT* If patient is at the pharmacy, call can be transferred to refill team.   1. Which medications need to be refilled? (please list name of each medication and dose if known) Lasik 40mg , Potassium 20mg  and Metoprolol 25mg    2. Which pharmacy/location (including street and city if local pharmacy) is medication to be sent to?[facility] [place]   3. Do they need a 30 day or 90 day supply? [place]

## 2015-05-18 MED ORDER — POTASSIUM CHLORIDE CRYS ER 20 MEQ PO TBCR: 20.0000 meq | EXTENDED_RELEASE_TABLET | Freq: Every day | ORAL | Status: DC

## 2015-05-18 MED ORDER — FUROSEMIDE 40 MG PO TABS: 40.0000 mg | ORAL_TABLET | Freq: Every day | ORAL | Status: DC

## 2015-05-18 MED ORDER — METOPROLOL TARTRATE 25 MG PO TABS: 25.0000 mg | ORAL_TABLET | Freq: Two times a day (BID) | ORAL | Status: DC

## 2015-05-18 NOTE — Telephone Encounter (Signed)
Refill sent to the pharmacy electronically.  

## 2015-05-21 ENCOUNTER — Other Ambulatory Visit: Payer: Self-pay

## 2015-05-21 DIAGNOSIS — Z1231 Encounter for screening mammogram for malignant neoplasm of breast: Secondary | ICD-10-CM

## 2015-05-24 ENCOUNTER — Encounter: Payer: Self-pay | Admitting: Family Medicine

## 2015-05-24 ENCOUNTER — Ambulatory Visit (INDEPENDENT_AMBULATORY_CARE_PROVIDER_SITE_OTHER): Payer: Medicare Other | Admitting: Family Medicine

## 2015-05-24 VITALS — BP 127/67 | HR 79 | Temp 99.6°F | Ht 63.75 in | Wt 181.0 lb

## 2015-05-24 DIAGNOSIS — R413 Other amnesia: Secondary | ICD-10-CM | POA: Diagnosis not present

## 2015-05-24 MED ORDER — DONEPEZIL HCL 10 MG PO TABS: 10.0000 mg | ORAL_TABLET | Freq: Every day | ORAL | Status: DC

## 2015-05-24 NOTE — Progress Notes (Signed)
Pre visit review using our clinic review tool, if applicable. No additional management support is needed unless otherwise documented below in the visit note. 

## 2015-05-24 NOTE — Progress Notes (Signed)
   Subjective:    Patient ID: [name] [name], female    DOB: 1941-02-27, 75 y.o.   MRN: [id number]  HPI Here with her daughter to follow up on memory loss. For the past month she has been taking Aricept 5 mg daily. She has tolerated this well but she has not seen much of an effect. Her daughter agrees.    Review of Systems  Constitutional: Negative.   Respiratory: Negative.   Cardiovascular: Negative.   Gastrointestinal: Negative.   Neurological: Negative.        Objective:   Physical Exam  Constitutional: She is oriented to person, place, and time. She appears well-developed and well-nourished.  Cardiovascular: Normal rate, regular rhythm, normal heart sounds and intact distal pulses.   Pulmonary/Chest: Effort normal and breath sounds normal.  Neurological: She is alert and oriented to person, place, and time.          Assessment & Plan:  Memory loss. Increase Aricept to 10 mg daily. Recheck one month.

## 2015-06-09 ENCOUNTER — Ambulatory Visit
Admission: RE | Admit: 2015-06-09 | Discharge: 2015-06-09 | Disposition: A | Discharge status: Home / Self Care | Payer: Medicare Other | Source: Ambulatory Visit

## 2015-06-09 DIAGNOSIS — Z1231 Encounter for screening mammogram for malignant neoplasm of breast: Secondary | ICD-10-CM | POA: Diagnosis not present

## 2015-06-10 ENCOUNTER — Ambulatory Visit (INDEPENDENT_AMBULATORY_CARE_PROVIDER_SITE_OTHER): Payer: Medicare Other | Admitting: Cardiovascular Disease

## 2015-06-10 ENCOUNTER — Encounter: Payer: Self-pay | Admitting: Cardiovascular Disease

## 2015-06-10 VITALS — BP 108/68 | HR 70 | Ht 65.0 in | Wt 181.4 lb

## 2015-06-10 DIAGNOSIS — E782 Mixed hyperlipidemia: Secondary | ICD-10-CM

## 2015-06-10 DIAGNOSIS — I10 Essential (primary) hypertension: Secondary | ICD-10-CM | POA: Diagnosis not present

## 2015-06-10 DIAGNOSIS — I208 Other forms of angina pectoris: Secondary | ICD-10-CM

## 2015-06-10 DIAGNOSIS — Z79899 Other long term (current) drug therapy: Secondary | ICD-10-CM

## 2015-06-10 DIAGNOSIS — I5033 Acute on chronic diastolic (congestive) heart failure: Secondary | ICD-10-CM

## 2015-06-10 NOTE — Assessment & Plan Note (Signed)
History of diastolic heart failure 2-D echo performed March 2015 that showed normal LV systolic function with grade 2 diastolic dysfunction. She remains on furosemide and potassium repletion and has been relatively stable from that point of view.

## 2015-06-10 NOTE — Assessment & Plan Note (Signed)
History of hypertension blood pressure measured 10 108/68. She is on metoprolol. Continue current meds at current dosing

## 2015-06-10 NOTE — Patient Instructions (Signed)
Labs-lipid ,liver function test  Nothing to eat or drink the morning of the test   Your physician wants you to follow-up in 12 months with DR BERRY. You will receive a reminder letter in the mail two months in advance. If you don't receive a letter, please call our office to schedule the follow-up appointment.  If you need a refill on your cardiac medications before your next appointment, please call your pharmacy.

## 2015-06-10 NOTE — Assessment & Plan Note (Signed)
History of hyperlipidemia on Crestor. We will recheck a lipid and liver profile 

## 2015-06-10 NOTE — Progress Notes (Signed)
06/10/2015 [name] [name]   [birth year]  [id number]  Primary Physician [name] Morale, MD Primary Cardiologist: [name] Harp MD [name] [name]   HPI:   [name] [name] is a very pleasant 75 year old mildly overweight Caucasian female mother of 2 children who is here today with her husband. I last saw her in the office 07/17/13.Marland Kitchen Her primary care physician is [name] [name]. She saw [name] [name] in consultation in the hospital. She was hospitalized in the past with congestive heart failure thought to be secondary to diastolic dysfunction. Her Myoview stress test was normal as well. Other problems include hypertension and hyperlipidemia as well as diabetes.  She is aware of salt restriction and weighs herself daily.    Current Outpatient Prescriptions  Medication Sig Dispense Refill  . aspirin EC 81 MG tablet Take 81 mg by mouth at bedtime.    . Calcium Carbonate-Vitamin D (CALCIUM + D PO) Take 1 tablet by mouth daily.    . diphenoxylate-atropine (LOMOTIL) 2.5-0.025 MG per tablet Take 1 tablet by mouth 3 (three) times daily as needed for diarrhea or loose stools. 270 tablet 1  . donepezil (ARICEPT) 10 MG tablet Take 1 tablet (10 mg total) by mouth at bedtime. 30 tablet 2  . Fe Fum-FePoly-Vit C-Vit B3 (INTEGRA) 62.5-62.5-40-3 MG CAPS TAKE 1 CAPSULE BY MOUTH DAILY. 30 capsule 0  . fish oil-omega-3 fatty acids 1000 MG capsule Take 1 g by mouth daily.     . furosemide (LASIX) 40 MG tablet Take 1 tablet (40 mg total) by mouth daily. <MUST MAKE APPOINTMENT FOR REFILLS> 90 tablet 0  . glucose blood test strip 1 each by Other route as needed. Use as instructed     . Insulin Isophane Human (NOVOLIN N RELION [place]) Inject 60-65 Units into the skin 2 (two) times daily.     . insulin regular (NOVOLIN R,HUMULIN R) 100 units/mL injection Inject 15 Units into the skin 3 (three) times daily before meals.    . meclizine (ANTIVERT) 25 MG tablet Take 1 tablet (25 mg total) by mouth every 4 (four) hours as  needed for dizziness. 60 tablet 5  . metFORMIN (GLUCOPHAGE-XR) 500 MG 24 hr tablet Take 1,000 mg by mouth 2 (two) times daily.    . metoprolol tartrate (LOPRESSOR) 25 MG tablet Take 1 tablet (25 mg total) by mouth 2 (two) times daily. <MUST MAKE APPOINTMENT FOR REFILLS> 180 tablet 0  . nitroGLYCERIN (NITRODUR - DOSED IN MG/24 HR) 0.2 mg/hr patch Reported on 05/24/2015    . omeprazole (PRILOSEC) 20 MG capsule TAKE 1 CAPSULE TWICE DAILY (MAKE AN OFFICE VISIT FOR FURTHER REFILLS) 180 capsule 0  . ondansetron (ZOFRAN-ODT) 4 MG disintegrating tablet Dissolve one tablet in mouth every eight hours as needed for nausea or vomiting. 20 tablet 10  . PARoxetine (PAXIL) 20 MG tablet Take 10 mg by mouth every morning.     . potassium chloride SA (K-DUR,KLOR-CON) 20 MEQ tablet Take 1 tablet (20 mEq total) by mouth daily. <MUST MAKE APPOINTMENT FOR REFILLS> 90 tablet 0  . RELION INSULIN SYRINGE 1ML/31G 31G X 5/16" 1 ML MISC     . rosuvastatin (CRESTOR) 10 MG tablet TAKE 1/2 TABLET EVERY DAY 45 tablet 3  . traMADol (ULTRAM) 50 MG tablet Take 1 tablet (50 mg total) by mouth every 6 (six) hours as needed. 120 tablet 5   No current facility-administered medications for this visit.    No Known Allergies  Social History   Social History  .  Marital Status: Married    Spouse Name: N/A  . Number of Children: 2  . Years of Education: N/A   Occupational History  . retired    Social History Main Topics  . Smoking status: Former Smoker -- 2.00 packs/day for 20 years    Types: Cigarettes    Quit date: 05/09/1975  . Smokeless tobacco: Never Used     Comment: smoked 1958-1977, up to 2 ppd  . Alcohol Use: 0.0 oz/week    0 Standard drinks or equivalent per week     Comment: rare  . Drug Use: No  . Sexual Activity: Not on file   Other Topics Concern  . Not on file   Social History Narrative     Review of Systems: General: negative for chills, fever, night sweats or weight changes.  Cardiovascular:  negative for chest pain, dyspnea on exertion, edema, orthopnea, palpitations, paroxysmal nocturnal dyspnea or shortness of breath Dermatological: negative for rash Respiratory: negative for cough or wheezing Urologic: negative for hematuria Abdominal: negative for nausea, vomiting, diarrhea, bright red blood per rectum, melena, or hematemesis Neurologic: negative for visual changes, syncope, or dizziness All other systems reviewed and are otherwise negative except as noted above.    Blood pressure 108/68, pulse 70, height 5\' 5"  (1.651 m), weight 181 lb 6 oz (82.271 kg).  General appearance: alert and no distress Neck: no adenopathy, no carotid bruit, no JVD, supple, symmetrical, trachea midline and thyroid not enlarged, symmetric, no tenderness/mass/nodules Lungs: clear to auscultation bilaterally Heart: regular rate and rhythm, S1, S2 normal, no murmur, click, rub or gallop Extremities: extremities normal, atraumatic, no cyanosis or edema  EKG normal sinus rhythm at 71 without ST or T-wave changes. I perceive reviewed this EKG  ASSESSMENT AND PLAN:   HYPERLIPIDEMIA History of hyperlipidemia on Crestor. We will recheck a lipid and liver profile  Essential hypertension History of hypertension blood pressure measured 10 108/68. She is on metoprolol. Continue current meds at current dosing  Acute on chronic diastolic HF (heart failure) History of diastolic heart failure 2-D echo performed March 2015 that showed normal LV systolic function with grade 2 diastolic dysfunction. She remains on furosemide and potassium repletion and has been relatively stable from that point of view.      [name] Harp MD FACP,FACC,FAHA, [facility] 06/10/2015 5:05 PM

## 2015-06-14 ENCOUNTER — Telehealth: Payer: Self-pay | Admitting: Internal Medicine

## 2015-06-14 MED ORDER — OMEPRAZOLE 20 MG PO CPDR: DELAYED_RELEASE_CAPSULE | ORAL | Status: DC

## 2015-06-14 NOTE — Telephone Encounter (Signed)
Rx sent 

## 2015-06-18 ENCOUNTER — Ambulatory Visit: Payer: Medicare Other | Admitting: Pulmonary Disease

## 2015-06-21 ENCOUNTER — Telehealth: Payer: Self-pay | Admitting: Internal Medicine

## 2015-06-21 ENCOUNTER — Encounter: Payer: Self-pay | Admitting: Gastroenterology

## 2015-06-21 DIAGNOSIS — E782 Mixed hyperlipidemia: Secondary | ICD-10-CM | POA: Diagnosis not present

## 2015-06-21 DIAGNOSIS — I209 Angina pectoris, unspecified: Secondary | ICD-10-CM | POA: Diagnosis not present

## 2015-06-21 DIAGNOSIS — I208 Other forms of angina pectoris: Secondary | ICD-10-CM | POA: Diagnosis not present

## 2015-06-21 DIAGNOSIS — Z79899 Other long term (current) drug therapy: Secondary | ICD-10-CM | POA: Diagnosis not present

## 2015-06-21 MED ORDER — DIPHENOXYLATE-ATROPINE 2.5-0.025 MG PO TABS: 1.0000 | ORAL_TABLET | Freq: Three times a day (TID) | ORAL | Status: DC | PRN

## 2015-06-21 NOTE — Telephone Encounter (Signed)
Error

## 2015-06-21 NOTE — Telephone Encounter (Signed)
Rx sent 

## 2015-06-22 ENCOUNTER — Ambulatory Visit (INDEPENDENT_AMBULATORY_CARE_PROVIDER_SITE_OTHER): Payer: Medicare Other | Admitting: Pulmonary Disease

## 2015-06-22 ENCOUNTER — Encounter: Payer: Self-pay | Admitting: Pulmonary Disease

## 2015-06-22 VITALS — BP 122/70 | HR 84 | Ht 65.0 in | Wt 178.2 lb

## 2015-06-22 DIAGNOSIS — I208 Other forms of angina pectoris: Secondary | ICD-10-CM | POA: Diagnosis not present

## 2015-06-22 DIAGNOSIS — G4733 Obstructive sleep apnea (adult) (pediatric): Secondary | ICD-10-CM | POA: Diagnosis not present

## 2015-06-22 DIAGNOSIS — R413 Other amnesia: Secondary | ICD-10-CM | POA: Diagnosis not present

## 2015-06-22 LAB — HEPATIC FUNCTION PANEL
ALBUMIN: 3.4 g/dL — AB (ref 3.6–5.1)
ALK PHOS: 97 U/L (ref 33–130)
ALT: 19 U/L (ref 6–29)
AST: 34 U/L (ref 10–35)
BILIRUBIN TOTAL: 1 mg/dL (ref 0.2–1.2)
Bilirubin, Direct: 0.3 mg/dL — ABNORMAL HIGH (ref <=0.2)
Indirect Bilirubin: 0.7 mg/dL (ref 0.2–1.2)
Total Protein: 6.9 g/dL (ref 6.1–8.1)

## 2015-06-22 LAB — LIPID PANEL
Cholesterol: 130 mg/dL (ref 125–200)
HDL: 37 mg/dL — AB (ref >=46)
LDL CALC: 69 mg/dL (ref <130)
TRIGLYCERIDES: 120 mg/dL (ref <150)
Total CHOL/HDL Ratio: 3.5 Ratio (ref <=5.0)
VLDL: 24 mg/dL (ref <30)

## 2015-06-22 NOTE — Progress Notes (Signed)
   Subjective:    Patient ID: [name] [name], female    DOB: March 22, 1941, 75 y.o.   MRN: [id number]  HPI  Chief Complaint  Patient presents with  . Follow-up    doing well on cpap; no download available, did not bring SD card to appointment.  Needs new DME company.    Accompanied by husband   At last OV, she was referred to the sleep Center for  mask fitting, and increased her starting pressure on the automatic range to 10-20 Download 07/2014 >> good control of events  C/o EDS - multiple naps during day Losing memory - on aricept - no formal diagnosis yet No snoring FF mask leaves mark over her face Compliant by report Wt unchanged  Significant tests/ events  HST 02/2013:  AHI 6/hr. ; Dr Cyndi Bender 05/2013:  Optimal pressure 13cm, no significant leaks, good control of AHI  Review of Systems     Objective:   Physical Exam  Gen. Pleasant, obese, in no distress ENT - no lesions, no post nasal drip Neck: No JVD, no thyromegaly, no carotid bruits Lungs: no use of accessory muscles, no dullness to percussion, decreased without rales or rhonchi  Cardiovascular: Rhythm regular, heart sounds  normal, no murmurs or gallops, no peripheral edema Musculoskeletal: No deformities, no cyanosis or clubbing , no tremors        Assessment & Plan:

## 2015-06-22 NOTE — Assessment & Plan Note (Signed)
OSA not contributing

## 2015-06-22 NOTE — Patient Instructions (Signed)
CPAP supplies will be renewed x  1 Year Medications with sedative side effects - Tramadol, metoprolol, Meclizine, Zofran

## 2015-06-22 NOTE — Assessment & Plan Note (Signed)
CPAP supplies will be renewed x  1 Year Medications with sedative side effects - Tramadol, metoprolol, Meclizine, Zofran Not a good candidate for stimulant  Weight loss encouraged, compliance with goal of at least 4-6 hrs every night is the expectation. Advised against medications with sedative side effects Cautioned against driving when sleepy - understanding that sleepiness will vary on a day to day basis

## 2015-06-23 ENCOUNTER — Other Ambulatory Visit: Payer: Self-pay

## 2015-06-23 ENCOUNTER — Telehealth: Payer: Self-pay

## 2015-06-23 ENCOUNTER — Encounter: Payer: Self-pay | Admitting: Family Medicine

## 2015-06-23 ENCOUNTER — Ambulatory Visit (INDEPENDENT_AMBULATORY_CARE_PROVIDER_SITE_OTHER): Payer: Medicare Other | Admitting: Family Medicine

## 2015-06-23 VITALS — BP 117/68 | HR 79 | Temp 98.7°F | Ht 65.0 in | Wt 180.0 lb

## 2015-06-23 DIAGNOSIS — K769 Liver disease, unspecified: Secondary | ICD-10-CM

## 2015-06-23 DIAGNOSIS — R413 Other amnesia: Secondary | ICD-10-CM

## 2015-06-23 DIAGNOSIS — I208 Other forms of angina pectoris: Secondary | ICD-10-CM | POA: Diagnosis not present

## 2015-06-23 MED ORDER — MEMANTINE HCL 10 MG PO TABS
10.0000 mg | ORAL_TABLET | Freq: Two times a day (BID) | ORAL | Status: DC
Start: 2015-06-23 — End: 2016-07-08

## 2015-06-23 MED ORDER — DONEPEZIL HCL 10 MG PO TABS: 10.0000 mg | ORAL_TABLET | Freq: Every day | ORAL | Status: DC

## 2015-06-23 NOTE — Progress Notes (Signed)
   Subjective:    Patient ID: [name] [name], female    DOB: 1940/11/11, 75 y.o.   MRN: [id number]  HPI Here with her husband to follow up on memory loss. She has been taking 10 mg a day of Aricept and she has tolerated this well, however neither of them has seen any improvement.   Review of Systems  Constitutional: Negative.   Respiratory: Negative.   Cardiovascular: Negative.   Gastrointestinal: Negative.   Neurological: Negative.        Objective:   Physical Exam  Constitutional: She is oriented to person, place, and time. She appears well-developed and well-nourished.  Cardiovascular: Normal rate, regular rhythm, normal heart sounds and intact distal pulses.   Pulmonary/Chest: Effort normal and breath sounds normal.  Abdominal: Soft. Bowel sounds are normal. She exhibits no distension and no mass. There is no tenderness. There is no rebound and no guarding.  Neurological: She is alert and oriented to person, place, and time.  Psychiatric: She has a normal mood and affect. Her behavior is normal. Thought content normal.          Assessment & Plan:  Memory loss, consistent with dementia. Stay on Aricept and add Namenda 10 mg bid. Recheck in 3 months

## 2015-06-23 NOTE — Telephone Encounter (Signed)
-----   Message from Algernon Huxley, RN sent at 03/24/2015  9:08 AM EST ----- Regarding: MRI-Liver protocol Pt needs MRI Liver Protocol in 3 mth follow-up liver lesion

## 2015-06-23 NOTE — Telephone Encounter (Signed)
Pt scheduled for MRI of liver at [facility] 07/05/15@3pm , pt to arrive there at 2:45pm. Pt to be NPO after 1pm. Pt needs to have bun and creatinine drawn prior to MRI. Orders in epic.

## 2015-06-23 NOTE — Progress Notes (Signed)
Pre visit review using our clinic review tool, if applicable. No additional management support is needed unless otherwise documented below in the visit note. 

## 2015-06-24 NOTE — Telephone Encounter (Signed)
Left message for pt to call back  °

## 2015-06-25 NOTE — Addendum Note (Signed)
Addended by: Diana Eves on: 06/25/2015 11:43 AM   Modules accepted: Orders

## 2015-06-28 NOTE — Telephone Encounter (Signed)
Spoke with pt and she is aware.

## 2015-06-29 ENCOUNTER — Ambulatory Visit: Payer: Medicare Other | Admitting: Cardiovascular Disease

## 2015-06-30 ENCOUNTER — Other Ambulatory Visit (INDEPENDENT_AMBULATORY_CARE_PROVIDER_SITE_OTHER): Payer: Medicare Other

## 2015-06-30 DIAGNOSIS — K769 Liver disease, unspecified: Secondary | ICD-10-CM

## 2015-06-30 DIAGNOSIS — K7689 Other specified diseases of liver: Secondary | ICD-10-CM | POA: Diagnosis not present

## 2015-06-30 LAB — BUN: BUN: 13 mg/dL (ref 6–23)

## 2015-06-30 LAB — CREATININE, SERUM: CREATININE: 0.98 mg/dL (ref 0.40–1.20)

## 2015-07-02 ENCOUNTER — Other Ambulatory Visit (INDEPENDENT_AMBULATORY_CARE_PROVIDER_SITE_OTHER): Payer: Medicare Other

## 2015-07-02 ENCOUNTER — Encounter: Payer: Self-pay | Admitting: Internal Medicine

## 2015-07-02 ENCOUNTER — Ambulatory Visit (INDEPENDENT_AMBULATORY_CARE_PROVIDER_SITE_OTHER): Payer: Medicare Other | Admitting: Internal Medicine

## 2015-07-02 VITALS — BP 90/58 | HR 75 | Ht 65.0 in | Wt 175.0 lb

## 2015-07-02 DIAGNOSIS — K625 Hemorrhage of anus and rectum: Secondary | ICD-10-CM | POA: Diagnosis not present

## 2015-07-02 DIAGNOSIS — I208 Other forms of angina pectoris: Secondary | ICD-10-CM | POA: Diagnosis not present

## 2015-07-02 DIAGNOSIS — Z85038 Personal history of other malignant neoplasm of large intestine: Secondary | ICD-10-CM | POA: Diagnosis not present

## 2015-07-02 DIAGNOSIS — K7460 Unspecified cirrhosis of liver: Secondary | ICD-10-CM

## 2015-07-02 DIAGNOSIS — R112 Nausea with vomiting, unspecified: Secondary | ICD-10-CM

## 2015-07-02 DIAGNOSIS — R410 Disorientation, unspecified: Secondary | ICD-10-CM

## 2015-07-02 DIAGNOSIS — K760 Fatty (change of) liver, not elsewhere classified: Secondary | ICD-10-CM

## 2015-07-02 DIAGNOSIS — Z8719 Personal history of other diseases of the digestive system: Secondary | ICD-10-CM | POA: Diagnosis not present

## 2015-07-02 DIAGNOSIS — K529 Noninfective gastroenteritis and colitis, unspecified: Secondary | ICD-10-CM

## 2015-07-02 LAB — CBC WITH DIFFERENTIAL/PLATELET
BASOS ABS: 0 10*3/uL (ref 0.0–0.1)
Basophils Relative: 0.4 % (ref 0.0–3.0)
EOS ABS: 0.1 10*3/uL (ref 0.0–0.7)
Eosinophils Relative: 2.2 % (ref 0.0–5.0)
HCT: 33.9 % — ABNORMAL LOW (ref 36.0–46.0)
Hemoglobin: 11.4 g/dL — ABNORMAL LOW (ref 12.0–15.0)
LYMPHS ABS: 1.2 10*3/uL (ref 0.7–4.0)
LYMPHS PCT: 23.9 % (ref 12.0–46.0)
MCHC: 33.8 g/dL (ref 30.0–36.0)
MCV: 89.9 fl (ref 78.0–100.0)
MONO ABS: 0.6 10*3/uL (ref 0.1–1.0)
Monocytes Relative: 12.2 % — ABNORMAL HIGH (ref 3.0–12.0)
NEUTROS ABS: 3 10*3/uL (ref 1.4–7.7)
NEUTROS PCT: 61.3 % (ref 43.0–77.0)
PLATELETS: 172 10*3/uL (ref 150.0–400.0)
RBC: 3.77 Mil/uL — ABNORMAL LOW (ref 3.87–5.11)
RDW: 16.1 % — ABNORMAL HIGH (ref 11.5–15.5)
WBC: 4.9 10*3/uL (ref 4.0–10.5)

## 2015-07-02 LAB — PROTIME-INR
INR: 1.4 ratio — ABNORMAL HIGH (ref 0.8–1.0)
Prothrombin Time: 14.5 s — ABNORMAL HIGH (ref 9.6–13.1)

## 2015-07-02 LAB — IBC PANEL
Iron: 97 ug/dL (ref 42–145)
SATURATION RATIOS: 19.2 % — AB (ref 20.0–50.0)
TRANSFERRIN: 361 mg/dL — AB (ref 212.0–360.0)

## 2015-07-02 LAB — FERRITIN: FERRITIN: 22.1 ng/mL (ref 10.0–291.0)

## 2015-07-02 MED ORDER — RIFAXIMIN 550 MG PO TABS
550.0000 mg | ORAL_TABLET | Freq: Two times a day (BID) | ORAL | Status: DC
Start: 2015-07-02 — End: 2016-04-17

## 2015-07-02 MED ORDER — NA SULFATE-K SULFATE-MG SULF 17.5-3.13-1.6 GM/177ML PO SOLN: ORAL | Status: DC

## 2015-07-02 NOTE — Patient Instructions (Addendum)
Your physician has requested that you go to the basement for the following lab work before leaving today: CBC, INR, IBC, Ferritin  You have been scheduled for an endoscopy and colonoscopy. Please follow the written instructions given to you at your visit today. Please pick up your prep supplies at the pharmacy within the next 1-3 days. If you use inhalers (even only as needed), please bring them with you on the day of your procedure. Your physician has requested that you go to www.startemmi.com and enter the access code given to you at your visit today. This web site gives a general overview about your procedure. However, you should still follow specific instructions given to you by our office regarding your preparation for the procedure.  Keep your scheduled appointment for MRI.  We have sent the following medications to your pharmacy for you to pick up at your convenience: Xifaxan 550 mg twice daily  Call our office back in 2 weeks with an update on whether you are less sleepy and confused.

## 2015-07-02 NOTE — Progress Notes (Signed)
Subjective:    Patient ID: [name] [name], female    DOB: 07-Oct-1940, 75 y.o.   MRN: [id number]  HPI Marrianna Lide is a 75 yo female with PMH of NAFLD cirrhosis, right-sided colon cancer status post ileocecectomy, history of breast cancer, history of bladder cancer, history of diverticulitis status post partial colectomy, history of GERD with Barrett's esophagus who is here for follow-up. She was last seen in April 2016. She comes today with her daughter.  She has had more frequent issues with nausea and gagging with dry heaving. She has not had true vomiting. This seems to be unrelated to eating and can occur throughout the day. Sometimes is there when she wakes up in the morning. Happens 4-5 days per week up in frequency from 6 months ago. Zofran works very well to abate symptoms but if she doesn't use Zofran symptoms last several hours. Appetite seems to be okay but her daughter feels is down recently. Her weight is down 8 pounds from April 2016. She reports her bowel movements are primarily loose and at times watery. She uses Lomotil on occasion. She occasionally sees scant red blood with wiping which has concerned her. She denies lower extremity edema or increasing abdominal swelling.  Her daughter near the end of the encounter states that she "sleeps all the time" and that she has been more confused recently. She's also been more forgetful and is being treated for dementia and recently started on Aricept and Namenda.  She denies recent falls.   She denies jaundice. No melena.  Patient stopped her oral iron for unclear reasons she fills the prescription likely expired.  She is taking omeprazole 20 mg twice daily.  She had an ultrasound performed in November for [facility] screening which revealed a 1 cm hypoechoic lesion in the mid liver. MRI was recommended and scheduled for next week  Review of Systems As per HPI, otherwise negative  Current Medications, Allergies, Past Medical History, Past  Surgical History, Family History and Social History were reviewed in [employer] record.     Objective:   Physical Exam BP 90/58 mmHg  Pulse 75  Ht 5\' 5"  (1.651 m)  Wt 175 lb (79.379 kg)  BMI 29.12 kg/m2 Constitutional: Well-developed and well-nourished. No distress. HEENT: Normocephalic and atraumatic. Oropharynx is clear and moist. No oropharyngeal exudate. Conjunctivae are normal.  No scleral icterus. Neck: Neck supple. Trachea midline. Cardiovascular: Normal rate, regular rhythm and intact distal pulses. No M/R/G Pulmonary/chest: Effort normal and breath sounds normal. No wheezing, rales or rhonchi. Abdominal: Soft, obese, nontender, nondistended. Bowel sounds active throughout.  Extremities: no clubbing, cyanosis, or edema Lymphadenopathy: No cervical adenopathy noted. Neurological: Alert and oriented to person place and time. No asterixis Skin: Skin is warm and dry. No rashes noted. Psychiatric: Normal mood and affect. Behavior is normal.    CBC    Component Value Date/Time   WBC 4.9 07/02/2015 1000   WBC 6.2 09/07/2014 1337   WBC 7.8 01/24/2007 1456   RBC 3.77* 07/02/2015 1000   RBC 3.59* 09/07/2014 1337   RBC 4.17 01/24/2007 1456   HGB 11.4* 07/02/2015 1000   HGB 11.2* 09/07/2014 1337   HGB 12.6 01/24/2007 1456   HCT 33.9* 07/02/2015 1000   HCT 33.9* 09/07/2014 1337   HCT 37.7 01/24/2007 1456   PLT 172.0 07/02/2015 1000   PLT 163 09/07/2014 1337   PLT 256 01/24/2007 1456   MCV 89.9 07/02/2015 1000   MCV 94.4 09/07/2014 1337  MCV 90 01/24/2007 1456   MCH 31.2 09/07/2014 1337   MCH 27.4 07/09/2013 0520   MCH 30.3 01/24/2007 1456   MCHC 33.8 07/02/2015 1000   MCHC 33.0 09/07/2014 1337   MCHC 33.5 01/24/2007 1456   RDW 16.1* 07/02/2015 1000   RDW 16.2* 09/07/2014 1337   RDW 11.6 01/24/2007 1456   LYMPHSABS 1.2 07/02/2015 1000   LYMPHSABS 1.9 09/07/2014 1337   LYMPHSABS 2.9 01/24/2007 1456   MONOABS 0.6 07/02/2015 1000   MONOABS 0.8  09/07/2014 1337   EOSABS 0.1 07/02/2015 1000   EOSABS 0.1 09/07/2014 1337   EOSABS 0.2 01/24/2007 1456   BASOSABS 0.0 07/02/2015 1000   BASOSABS 0.0 09/07/2014 1337   BASOSABS 0.1 01/24/2007 1456    CMP     Component Value Date/Time   NA 141 09/07/2014 1337   NA 139 08/10/2014 1415   NA 138 01/18/2006 1426   K 3.8 09/07/2014 1337   K 4.0 08/10/2014 1415   K 4.2 01/18/2006 1426   CL 102 08/10/2014 1415   CL 99 01/18/2006 1426   CO2 20* 09/07/2014 1337   CO2 31 08/10/2014 1415   CO2 29 01/18/2006 1426   GLUCOSE 116 09/07/2014 1337   GLUCOSE 109* 08/10/2014 1415   GLUCOSE 130* 01/18/2006 1426   BUN 13 06/30/2015 1224   BUN 14.2 09/07/2014 1337   BUN 19 01/18/2006 1426   CREATININE 0.98 06/30/2015 1224   CREATININE 0.8 09/07/2014 1337   CREATININE 0.9 01/18/2006 1426   CALCIUM 9.6 09/07/2014 1337   CALCIUM 9.8 08/10/2014 1415   CALCIUM 10.3 01/18/2006 1426   PROT 6.9 06/21/2015 0910   PROT 7.1 09/07/2014 1337   ALBUMIN 3.4* 06/21/2015 0910   ALBUMIN 3.2* 09/07/2014 1337   AST 34 06/21/2015 0910   AST 37* 09/07/2014 1337   ALT 19 06/21/2015 0910   ALT 25 09/07/2014 1337   ALKPHOS 97 06/21/2015 0910   ALKPHOS 113 09/07/2014 1337   BILITOT 1.0 06/21/2015 0910   BILITOT 0.93 09/07/2014 1337   GFRNONAA 85* 07/10/2013 0428   GFRAA >90 07/10/2013 0428    Iron/TIBC/Ferritin/ %Sat    Component Value Date/Time   IRON 97 07/02/2015 1000   IRON 102 09/05/2013 1538   TIBC 461* 09/05/2013 1538   FERRITIN 22.1 07/02/2015 1000   FERRITIN 31 09/05/2013 1538   IRONPCTSAT 19.2* 07/02/2015 1000   IRONPCTSAT 22 09/05/2013 1538   Lab Results  Component Value Date   INR 1.4* 07/02/2015   INR 1.1* 08/10/2014   INR 1.2* 03/23/2014   PROTIME 20.0* 10/26/2005   PROTIME 19.0 10/17/2005   PROTIME 19.5* 10/10/2005     CLINICAL DATA:  Nonalcoholic steatohepatitis, HCC screening   EXAM: ULTRASOUND ABDOMEN COMPLETE   COMPARISON:  09/03/2014   FINDINGS: Gallbladder:  Distended gallbladder with layering sludge. No gallbladder wall thickening or pericholecystic fluid. Negative sonographic Murphy's sign.   Common bile duct: Diameter: 5.5 mm   Liver: Nodular hepatic contour with coarse hepatic echotexture, reflecting cirrhosis. 10 x 9 x 9 mm hypoechoic lesion in the central liver (image 67), poorly evaluated.   IVC: No abnormality visualized.   Pancreas: Visualized portion unremarkable. 1.7 x 1.2 x 1.3 cm peripancreatic node near the porta hepatis.   Spleen: Size and appearance within normal limits.   Right Kidney: Length: 12.6 cm.  No mass or hydronephrosis.   Left Kidney: Length: 12.3 cm.  No mass or hydronephrosis.   Abdominal aorta: No aneurysm visualized.   Other findings: None.   IMPRESSION:  Cirrhosis.   10 mm hypoechoic lesion in the central liver, poorly evaluated. Correlate with serum AFP. Given the small size, follow-up MRI abdomen with/ without contrast is suggested in 3 months for further characterization.   Layering gallbladder sludge.  No associated inflammatory changes.   12 mm short axis peripancreatic node near the porta hepatitis, likely reactive.     Electronically Signed   By: Julian Hy M.D.   On: 03/23/2015 12:10      Assessment & Plan:  75 yo female with PMH of NAFLD cirrhosis, right-sided colon cancer status post ileocecectomy, history of breast cancer, history of bladder cancer, history of diverticulitis status post partial colectomy, history of GERD with Barrett's esophagus who is here for follow-up.  1. Nausea/dry heaving -- these episodes are not associated with abdominal pain. I have recommended upper endoscopy to evaluate causes of these symptoms. She did have an inflammatory but benign polyp seen on the greater curvature of the gastric body at the time of her last upper endoscopy. Perhaps this could be causing some of the symptoms and warts further investigation. We we discussed the risk benefits  and alternatives to upper endoscopy and colonoscopy and she is agreeable to proceed. I informed her that polypectomy and the stomach can be associated with bleeding and she understands this risk. Other possibilities include medication related nausea such as tramadol. She's using this several times per week. I asked that she and her daughter pay attention to see if her nausea follows tramadol use. Continue Zofran as needed for these episodes while evaluation ongoing.  2. NAFLD cirrhosis -- liver enzymes have remained normal though INR has increased to 1.4. Her sleepiness and confusion may be related to dementia, but I'm also entertaining the idea of hepatic encephalopathy. She does not have asterixis today. I'm going to try rifaximin 550 twice a day for one month while her daughter is in town to see if this provides any improvement in overall mental acuity and sleepiness. I've asked that her daughter call the office in 2 weeks with an update. If no benefit or change this medication can likely be discontinued. --HCC screening -- 1 cm lesion seen by ultrasound, follow-up MRI is pending. Check AFP today --Influenza vaccine and Pneumovax up-to-date --Variceal screening -- up-to-date but performing upper endoscopy as discussed in #1 --Low sodium and low-fat diet  3. Liver lesion -- as discussed in #2  4. History of Barrett's -- no Barrett's in April 2015, on PPI, EGD scheduled as discussed in #1  5. Rectal bleeding with loose stools -- rectal bleeding is fairly new. Loose stools is chronic. Last colonoscopy was performed in August 2013 by Dr. Sharlett Iles. This showed a widely patent ileocolonic anastomosis and another colocolonic anastomosis in the sigmoid. A 5 mm rectal adenoma was removed. Given her history of colon cancer and new rectal bleeding of recommended repeat colonoscopy at this time. We discussed the risks, benefits and alternatives and she is agreeable to proceed. Plan random biopsies to exclude  microscopic colitis  45 minutes spent with the patient and family today. Greater than 50% was spent in counseling and coordination of care with the patient

## 2015-07-05 ENCOUNTER — Ambulatory Visit (HOSPITAL_COMMUNITY)
Admission: RE | Admit: 2015-07-05 | Discharge: 2015-07-05 | Disposition: A | Discharge status: Home / Self Care | Payer: Medicare Other | Source: Ambulatory Visit | Attending: Internal Medicine | Admitting: Internal Medicine

## 2015-07-05 DIAGNOSIS — K8689 Other specified diseases of pancreas: Secondary | ICD-10-CM | POA: Insufficient documentation

## 2015-07-05 DIAGNOSIS — K7689 Other specified diseases of liver: Secondary | ICD-10-CM | POA: Diagnosis not present

## 2015-07-05 DIAGNOSIS — K7460 Unspecified cirrhosis of liver: Secondary | ICD-10-CM | POA: Diagnosis not present

## 2015-07-05 DIAGNOSIS — K769 Liver disease, unspecified: Secondary | ICD-10-CM

## 2015-07-05 MED ORDER — GADOBENATE DIMEGLUMINE 529 MG/ML IV SOLN
20.0000 mL | Freq: Once | INTRAVENOUS | Status: AC | PRN
  Administered 2015-07-05: 16 mL via INTRAVENOUS

## 2015-07-07 ENCOUNTER — Other Ambulatory Visit: Payer: Self-pay

## 2015-07-07 DIAGNOSIS — C220 Liver cell carcinoma: Secondary | ICD-10-CM

## 2015-07-08 ENCOUNTER — Telehealth: Payer: Self-pay | Admitting: *Deleted

## 2015-07-08 ENCOUNTER — Other Ambulatory Visit: Payer: Self-pay

## 2015-07-08 ENCOUNTER — Encounter: Payer: Self-pay | Admitting: *Deleted

## 2015-07-08 DIAGNOSIS — C220 Liver cell carcinoma: Secondary | ICD-10-CM

## 2015-07-08 NOTE — Telephone Encounter (Signed)
Son, Roderica Mahin called and confirmed appt. 3/21 at 2pm.

## 2015-07-08 NOTE — Telephone Encounter (Signed)
Oncology Nurse Navigator Documentation  Oncology Nurse Navigator Flowsheets 07/08/2015  Navigator Location [facility]  Navigator Encounter Type Introductory phone call  Referral from Dr. Hilarie Fredrickson with Dx: [place]. Notified office to go ahead and place referral for IR consult and Dr. Benay Spice will see her on 07/27/15 at 2 pm. Left appointment on patient's home voice mail w/request to return call to confirm. Provided direct # for navigator.

## 2015-07-09 ENCOUNTER — Telehealth: Payer: Self-pay | Admitting: *Deleted

## 2015-07-09 NOTE — Telephone Encounter (Signed)
Patient's medication needed a prior authorization. Rx was approved through insurance after auth request. Unfortunately, the prescription will still be $630.00 per month even with insurance coverage. We will attempt to get patient assistance for this medication. I have filled out our portion of patient assistance program forms and patient is informed to come by our office with proof of income or social security statement from last year and to sign paperwork. She verbalizes understanding.

## 2015-07-09 NOTE — Telephone Encounter (Signed)
-----   Message from Jerene Bears, MD sent at 07/07/2015  2:18 PM EST ----- Rifaximin was 2000 dollars and we need to argue with her insurance provider. Cirrhosis with portal HTN and hepatocellular cancer She does not need lactulose due to chronic diarrhea, hx of colon cancer, s/p partial colectomy JMP

## 2015-07-13 ENCOUNTER — Ambulatory Visit
Admission: RE | Admit: 2015-07-13 | Discharge: 2015-07-13 | Disposition: A | Discharge status: Home / Self Care | Payer: Medicare Other | Source: Ambulatory Visit | Attending: Internal Medicine | Admitting: Internal Medicine

## 2015-07-13 DIAGNOSIS — C220 Liver cell carcinoma: Secondary | ICD-10-CM

## 2015-07-13 DIAGNOSIS — K7689 Other specified diseases of liver: Secondary | ICD-10-CM | POA: Diagnosis not present

## 2015-07-13 NOTE — Consult Note (Signed)
Chief Complaint: Patient was seen in consultation today for  Chief Complaint  Patient presents with  . Advice Only    Consult for [facility]     at the request of 66 M  Referring Physician(s): [name],Jay M  History of Present Illness: [name] [name] is a 75 y.o. female with numerous medical comorbidities including NASH cirrhosis. A recent screening abdominal ultrasounds identified and approximately 1 cm lesion in the central aspect of her liver. An MRI study was ordered in follow-up which demonstrates 3 small lesions (1.4 cm, 0.8 cm, 0.9 cm) which are all clearly arterially enhancing and demonstrated a suggestion of washout. For the single lesion larger than 1 cm, these imaging characteristics are consistent with hepatocellular cancer. The lesion smaller than 1 cm are nonspecific by definition. The lesions are scattered throughout the liver.  She has a history of prior right-sided colon cancer status post ileocecostomy and neoadjuvant chemotherapy as well as a history of breast cancer status post mastectomy. She also has a history of superficial bladder cancer and skin cancer which were considered mild and treated locally.  [name] [name] has multiple symptoms which are almost certainly not related to these 3 small liver lesions.  She is experiencing signs of dementia for which she takes Namenda and Aricept.  Additionally, she recently started Xifaxan in an effort to exclude hepatic encephalopathy as an underlying cause for her cognitive deterioration.  She also has fatigue, loose stools, decreased appetite and occasional blood in her stools. She presents today to discuss possible further treatment of her newly diagnosed liver lesions.  She denies melena, jaundice, or abdominal pain.  Past Medical History  Diagnosis Date  . Transaminase or LDH elevation     sees Dr. Zenovia Jarred   . Hiatal hernia   . GERD (gastroesophageal reflux disease)   . Diverticulosis of colon (without mention of  hemorrhage) 09/26/2004,09/10/2006  . Hypertension   . Hyperlipidemia   . Hyperglycemia   . IBS (irritable bowel syndrome)   . Allergy     environmental  . Sleep apnea     sees Dr. Gwenette Greet   . Hepatic steatosis   . Arthritis     sees Dr. Hurley Cisco   . Adenocarcinoma, breast [facility])     sees Dr. Marcy Panning   . Bladder cancer ([name] [name])   . Malignant neoplasm of colon, unspecified site 07/08/2003  . Diabetes mellitus     sees Dr. Chalmers Cater   . Gastritis   . Hemorrhoids   . Barrett esophagus 12/20/11  . Inflammatory polyps of colon ([place])   . Hepatic cirrhosis ([place])   . Iron deficiency anemia   . Cancer, hepatocellular [facility])     Past Surgical History  Procedure Laterality Date  . Sigmoid colectomy 2005      for cancer  . Incisonal hernia    . Mastectomy and biopsy    . Bladder cancer 2001    . Squamous cell ca  r  calf  2008    . Carpal tunnel release    . Esophageal dilation    . Colonoscopy    . Colon surgery  1995    for diverticulitis    Allergies: Review of patient's allergies indicates no known allergies.  Medications: Prior to Admission medications   Medication Sig Start Date End Date Taking? Authorizing Provider  Calcium Carbonate-Vitamin D (CALCIUM + D PO) Take 1 tablet by mouth daily.   Yes Historical Provider, MD  diphenoxylate-atropine (LOMOTIL) 2.5-0.025 MG tablet  Take 1 tablet by mouth 3 (three) times daily as needed for diarrhea or loose stools. 06/21/15  Yes Jerene Bears, MD  donepezil (ARICEPT) 10 MG tablet Take 1 tablet (10 mg total) by mouth at bedtime. 06/23/15  Yes [name], MD  furosemide (LASIX) 40 MG tablet Take 1 tablet (40 mg total) by mouth daily. <MUST MAKE APPOINTMENT FOR REFILLS> 05/18/15  Yes Lorretta Harp, MD  glucose blood test strip 1 each by Other route as needed. Use as instructed    Yes Historical Provider, MD  Insulin Isophane Human (NOVOLIN N RELION [place]) Inject 60-65 Units into the skin 2 (two) times daily.    Yes Historical  Provider, MD  insulin regular (NOVOLIN R,HUMULIN R) 100 units/mL injection Inject 15 Units into the skin 3 (three) times daily before meals.   Yes Historical Provider, MD  meclizine (ANTIVERT) 25 MG tablet Take 1 tablet (25 mg total) by mouth every 4 (four) hours as needed for dizziness. 05/13/14  Yes [name], MD  memantine (NAMENDA) 10 MG tablet Take 1 tablet (10 mg total) by mouth 2 (two) times daily. 06/23/15  Yes [name], MD  metFORMIN (GLUCOPHAGE-XR) 500 MG 24 hr tablet Take 1,000 mg by mouth 2 (two) times daily. 04/14/13  Yes Historical Provider, MD  metoprolol tartrate (LOPRESSOR) 25 MG tablet Take 1 tablet (25 mg total) by mouth 2 (two) times daily. <MUST MAKE APPOINTMENT FOR REFILLS> 05/18/15  Yes Lorretta Harp, MD  nitroGLYCERIN (NITRODUR - DOSED IN MG/24 HR) 0.2 mg/hr patch Reported on 07/02/2015 01/17/14  Yes Historical Provider, MD  omeprazole (PRILOSEC) 20 MG capsule TAKE 1 CAPSULE TWICE DAILY (MAKE AN OFFICE VISIT FOR FURTHER REFILLS) 06/14/15  Yes Jerene Bears, MD  ondansetron (ZOFRAN-ODT) 4 MG disintegrating tablet Dissolve one tablet in mouth every eight hours as needed for nausea or vomiting. 08/10/14  Yes Jerene Bears, MD  potassium chloride SA (K-DUR,KLOR-CON) 20 MEQ tablet Take 1 tablet (20 mEq total) by mouth daily. <MUST MAKE APPOINTMENT FOR REFILLS> 05/18/15  Yes Lorretta Harp, MD  RELION INSULIN SYRINGE 1ML/31G 31G X 5/16" 1 ML MISC  12/08/11  Yes Historical Provider, MD  rifaximin (XIFAXAN) 550 MG TABS tablet Take 1 tablet (550 mg total) by mouth 2 (two) times daily. 07/02/15  Yes Jerene Bears, MD  rosuvastatin (CRESTOR) 10 MG tablet TAKE 1/2 TABLET EVERY DAY 10/27/14  Yes [name], MD  traMADol (ULTRAM) 50 MG tablet Take 1 tablet (50 mg total) by mouth every 6 (six) hours as needed. 03/11/15  Yes [name], MD  aspirin EC 81 MG tablet Take 81 mg by mouth at bedtime.    Historical Provider, MD  Fe Fum-FePoly-Vit C-Vit B3 (INTEGRA) 62.5-62.5-40-3 MG CAPS TAKE 1  CAPSULE BY MOUTH DAILY. Patient not taking: Reported on 07/13/2015 12/25/14   Jerene Bears, MD  fish oil-omega-3 fatty acids 1000 MG capsule Take 1 g by mouth daily.     Historical Provider, MD  Na Sulfate-K Sulfate-Mg Sulf SOLN Suprep-Use as directed Patient not taking: Reported on 07/13/2015 07/02/15   Jerene Bears, MD     Family History  Problem Relation Age of Onset  . Diabetes Mother   . Skin cancer Mother   . Lung cancer Father   . Lymphoma Sister   . Hypertension Sister   . Diabetes Sister   . Hypertension Sister   . Breast cancer Sister   . Diabetes Sister   .  Hypertension Sister   . Inflammatory bowel disease Daughter   . Stroke Neg Hx   . Esophageal cancer Son   . Colon cancer Neg Hx     Social History   Social History  . Marital Status: Married    Spouse Name: N/A  . Number of Children: 2  . Years of Education: N/A   Occupational History  . retired    Social History Main Topics  . Smoking status: Former Smoker -- 2.00 packs/day for 20 years    Types: Cigarettes    Quit date: 05/09/1975  . Smokeless tobacco: Never Used     Comment: smoked 1958-1977, up to 2 ppd  . Alcohol Use: 0.0 oz/week    0 Standard drinks or equivalent per week     Comment: rare  . Drug Use: No  . Sexual Activity: Not on file   Other Topics Concern  . Not on file   Social History Narrative    ECOG Status: 2 - Symptomatic, <50% confined to bed  Review of Systems: A 12 point ROS discussed and pertinent positives are indicated in the HPI above.  All other systems are negative.  Review of Systems  Vital Signs: BP 114/62 mmHg  Pulse 74  Temp(Src) 98.4 F (36.9 C) (Oral)  Resp 14  Ht 5\' 5"  (1.651 m)  Wt 175 lb (79.379 kg)  BMI 29.12 kg/m2  SpO2 99%  Physical Exam  Constitutional: She is oriented to person, place, and time. She appears well-developed and well-nourished.  HENT:  Head: Normocephalic and atraumatic.  Eyes: No scleral icterus.  Cardiovascular: Normal rate and  regular rhythm.   Pulmonary/Chest: Effort normal.  Abdominal: Soft. She exhibits no distension. There is no tenderness.  Neurological: She is alert and oriented to person, place, and time.  No asterixis  Skin: Skin is warm and dry.  Psychiatric: She has a normal mood and affect. Her behavior is normal. Cognition and memory are impaired.  Nursing note and vitals reviewed.    Imaging: Mr Liver W Wo Contrast  07/05/2015  CLINICAL DATA:  10 mm liver lesion on ultrasound in a patient with cirrhosis. EXAM: MRI ABDOMEN WITHOUT AND WITH CONTRAST TECHNIQUE: Multiplanar multisequence MR imaging of the abdomen was performed both before and after the administration of intravenous contrast. CONTRAST:  42mL MULTIHANCE GADOBENATE DIMEGLUMINE 529 MG/ML IV SOLN COMPARISON:  Ultrasound exam 03/23/2015. Abdominal MRI done for renal abnormality on 04/13/2011. FINDINGS: Lower chest:  Unremarkable. Hepatobiliary: Liver has a diffusely nodular contour and heterogeneous signal intensity, consistent with the reported clinical history of cirrhosis. On today's exam, there are at least 3 discrete hypervascular lesions within the liver. The largest of these is in the dome of the liver (segment VIII) and measures 1.4 cm (image 15 series 1101). A second 8 mm hypervascular lesion is seen in the posterior right liver (segment V) on image 45 of series 1101. A third at 11 mm hypervascular lesion is seen in the medial segment left liver, adjacent to the gallbladder fossa (image 50 series 1101). No other hypervascular lesions are identified. Although the dome of the liver was not included on the previous MRI, the other lesions (in segments V and IV) are new in the interval. On the precontrast T1 weighted imaging, there are a few scattered tiny lesions demonstrating increased T1 signal intensity, compatible with dysplastic nodules. Pancreas: 10 x 15 x 14 mm cystic lesion in the head of the pancreas communicates with the main pancreatic  duct. Comparing back to  the older study, in retrospect a tiny 5 mm cystic lesion was seen at this location. 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 Spleen: No splenomegaly. No focal mass lesion. Spleen measures 13.9 cm in craniocaudal length. Adrenals/Urinary Tract: No adrenal nodule or mass. Kidneys are unremarkable. Stomach/Bowel: Stomach is nondistended. No gastric wall thickening. No evidence of outlet obstruction. Duodenum is normally positioned as is the ligament of Treitz. No small bowel or colonic dilatation within the imaged portions of the abdomen and upper pelvis. Vascular/Lymphatic: No abdominal aortic aneurysm. The portal vein is slightly prominent and a degree of portal hypertension not excluded. Portal vein is patent as is the superior mesenteric vein and the splenic vein. No evidence for lymphadenopathy in the hepatoduodenal ligament. No other abdominal lymphadenopathy is visualized. Other: No intraperitoneal free fluid. Musculoskeletal: No abnormal marrow signal within the visualized bony anatomy. IMPRESSION: 1. Three discrete small lesions are identified within this background of cirrhotic liver. Imaging features are compatible with multi focal hepatoma. 2. 15 mm cystic lesion in the head of the pancreas communicates with the main pancreatic duct. 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 3. Upper normal spleen size with prominent, but patent portal vein. Portal venous hypertension is a consideration. Electronically Signed   By: Misty Stanley M.D.   On: 07/05/2015 17:02    Labs:  CBC:  Recent Labs  08/10/14 1415 09/07/14 1337 07/02/15 1000  WBC 7.5 6.2 4.9  HGB 11.9* 11.2* 11.4*  HCT 34.8* 33.9* 33.9*  PLT 177.0 163 172.0    COAGS:  Recent Labs  08/10/14 1415 07/02/15 1000  INR 1.1* 1.4*    BMP:  Recent Labs  08/10/14 1415 09/07/14 1337 06/30/15 1224  NA 139 141  --   K 4.0 3.8  --   CL 102  --   --   CO2 31 20*  --   GLUCOSE 109*  116  --   BUN 14 14.2 13  CALCIUM 9.8 9.6  --   CREATININE 0.80 0.8 0.98    LIVER FUNCTION TESTS:  Recent Labs  08/10/14 1415 09/07/14 1337 06/21/15 0910  BILITOT 1.0 0.93 1.0  AST 37 37* 34  ALT 36* 25 19  ALKPHOS 144* 113 97  PROT 7.6 7.1 6.9  ALBUMIN 3.6 3.2* 3.4*    TUMOR MARKERS: No results for input(s): AFPTM, CEA, CA199, CHROMGRNA in the last 8760 hours.  Assessment and Plan:  Pleasant 76 year old female with signs of early dementia and numerous medical comorbidities including NASH cirrhosis with 3 small arterially enhancing lesions recently identified in her liver. While the imaging characteristics are suggestive of hepatocellular cancer, particularly for the largest 1.4 cm lesion, it is somewhat atypical to have 3 small lesions appear simultaneously in disparate locations.  Additionally, she has a significant past history of cancer including breast and colon cancer within the last 10 years. Delayed metastasis, while rare, is also a consideration.  Finally, given her age and numerous medical comorbidities, she may have a difficult time tolerating any liver directed therapy. We need to make very sure exactly what we are dealing with before w embark on any possible therapeutic intervention.  1.) I will have my staff schedule her for an ultrasound guided biopsy of her liver lesion (visible on prior screening ultrasound) later this week or next.  2.) Once we have pathologic confirmation of hepatocellular cancer or metastatic disease we will be able to re-assess for a more definitive plan. If this is hepatocellular cancer my  initial thought is to proceed conservatively and repeat the abdominal MRI in 3 months. If these lesions remain stable, no treatment may be required. However, once definitive growth has been demonstrated we could consider either percutaneous thermal ablation, or possibly chemotherapy embolization.  Thank you for this interesting consult.  I greatly enjoyed  meeting [name] [name] and look forward to participating in their care.  A copy of this report was sent to the requesting provider on this date.  Electronically Signed: [name] 07/13/2015, 9:42 PM   I spent a total of  40 Minutes   in face to face in clinical consultation, greater than 50% of which was counseling/coordinating care for liver lesions.

## 2015-07-14 ENCOUNTER — Other Ambulatory Visit (HOSPITAL_COMMUNITY): Payer: Self-pay | Admitting: Interventional Radiology

## 2015-07-14 DIAGNOSIS — K769 Liver disease, unspecified: Secondary | ICD-10-CM

## 2015-07-14 DIAGNOSIS — C220 Liver cell carcinoma: Secondary | ICD-10-CM

## 2015-07-15 ENCOUNTER — Ambulatory Visit (AMBULATORY_SURGERY_CENTER): Payer: Medicare Other | Admitting: Internal Medicine

## 2015-07-15 ENCOUNTER — Encounter: Payer: Self-pay | Admitting: Internal Medicine

## 2015-07-15 VITALS — BP 118/45 | HR 66 | Temp 97.5°F | Resp 14 | Ht 65.0 in | Wt 175.0 lb

## 2015-07-15 DIAGNOSIS — R112 Nausea with vomiting, unspecified: Secondary | ICD-10-CM | POA: Diagnosis present

## 2015-07-15 DIAGNOSIS — E109 Type 1 diabetes mellitus without complications: Secondary | ICD-10-CM | POA: Diagnosis not present

## 2015-07-15 DIAGNOSIS — K2970 Gastritis, unspecified, without bleeding: Secondary | ICD-10-CM

## 2015-07-15 DIAGNOSIS — K625 Hemorrhage of anus and rectum: Secondary | ICD-10-CM | POA: Diagnosis not present

## 2015-07-15 DIAGNOSIS — K529 Noninfective gastroenteritis and colitis, unspecified: Secondary | ICD-10-CM

## 2015-07-15 DIAGNOSIS — K2950 Unspecified chronic gastritis without bleeding: Secondary | ICD-10-CM | POA: Diagnosis not present

## 2015-07-15 DIAGNOSIS — K317 Polyp of stomach and duodenum: Secondary | ICD-10-CM | POA: Diagnosis not present

## 2015-07-15 DIAGNOSIS — C189 Malignant neoplasm of colon, unspecified: Secondary | ICD-10-CM | POA: Diagnosis not present

## 2015-07-15 DIAGNOSIS — Z85038 Personal history of other malignant neoplasm of large intestine: Secondary | ICD-10-CM | POA: Diagnosis not present

## 2015-07-15 DIAGNOSIS — K7460 Unspecified cirrhosis of liver: Secondary | ICD-10-CM | POA: Diagnosis not present

## 2015-07-15 LAB — GLUCOSE, CAPILLARY
GLUCOSE-CAPILLARY: 145 mg/dL — AB (ref 65–99)
Glucose-Capillary: 156 mg/dL — ABNORMAL HIGH (ref 65–99)

## 2015-07-15 MED ORDER — SODIUM CHLORIDE 0.9 % IV SOLN: 500.0000 mL | INTRAVENOUS | Status: DC

## 2015-07-15 MED ORDER — SUCRALFATE 1 GM/10ML PO SUSP: 1.0000 g | Freq: Four times a day (QID) | ORAL | Status: DC

## 2015-07-15 NOTE — Op Note (Signed)
[place] Patient Name: [name] [name] Procedure Date: 07/15/2015 10:21 AM MRN: [id number] Endoscopist: Jerene Bears , MD Age: 75 Referring MD:  Date of Birth: [birth year] Gender: Female Procedure:                Upper GI endoscopy Indications:              Gastric polyps, Cirrhosis rule out esophageal                            varices, Nausea with vomiting Medicines:                Monitored Anesthesia Care Procedure:                Pre-Anesthesia Assessment:                           - Prior to the procedure, a History and Physical                            was performed, and patient medications and                            allergies were reviewed. The patient's tolerance of                            previous anesthesia was also reviewed. The risks                            and benefits of the procedure and the sedation                            options and risks were discussed with the patient.                            All questions were answered, and informed consent                            was obtained. Prior Anticoagulants: The patient has                            taken no previous anticoagulant or antiplatelet                            agents. ASA Grade Assessment: III - A patient with                            severe systemic disease. After reviewing the risks                            and benefits, the patient was deemed in                            satisfactory condition to undergo the procedure.  After obtaining informed consent, the endoscope was                            passed under direct vision. Throughout the                            procedure, the patient's blood pressure, pulse, and                            oxygen saturations were monitored continuously. The                            Model GIF-[id number]) scope was introduced                            through the mouth, and advanced to the second part                             of duodenum. The upper GI endoscopy was                            accomplished without difficulty. The patient                            tolerated the procedure well. Scope In: Scope Out: Findings:      The examined esophagus was normal.      There is no endoscopic evidence of varices in the entire esophagus.      A single 15 mm pedunculated polyp was found on the greater curvature of       the stomach. The polyp was removed with a hot snare. Resection and       retrieval were complete. To prevent bleeding after the polypectomy, one       hemostatic clip was successfully placed.      A few small sessile polyps, with the typical appearance for fundic gland       polyps, with no bleeding were found in the cardia and in the gastric       fundus. Polypectomy was not attempted.      Diffuse mild inflammation characterized by congestion (edema) and       erythema was found in the entire examined stomach. Multiple biopsies       were obtained in the gastric body, at the incisura and in the gastric       antrum with cold forceps for histology.      There is no endoscopic evidence of varices in the stomach.      The examined duodenum was normal. Complications:            No immediate complications. Estimated Blood Loss:     Estimated blood loss was minimal. Impression:               - Normal esophagus.                           - A single gastric polyp. Resected and retrieved.  Clip was placed.                           - A few gastric polyps. Resection not attempted.                           - Gastritis.                           - Normal examined duodenum.                           - Multiple biopsies were obtained in the gastric                            body, at the incisura and in the gastric antrum. Recommendation:           - Patient has a contact number available for                            emergencies. The signs and symptoms  of potential                            delayed complications were discussed with the                            patient. Return to normal activities tomorrow.                            Written discharge instructions were provided to the                            patient.                           - Resume previous diet.                           - Continue present medications.                           - Await pathology results.                           - Perform a colonoscopy today.                           - Continue omeprazole 20 mg twice daily.                           Trial of liquid sucralfate 1 g before meals and at                            bedtime                           Schedule Zofran if nausea/dry heaving remains  fairly constant                           - The findings and recommendations were discussed                            with the patient's family. Procedure Code(s):        --- Professional ---                           [id number], Esophagogastroduodenoscopy, flexible,                            transoral; with removal of tumor(s), polyp(s), or                            other lesion(s) by snare technique                           43239, 52, Esophagogastroduodenoscopy, flexible,                            transoral; with biopsy, single or multiple CPT copyright 2016 [employer] Association. All rights reserved. [name]. Hilarie Fredrickson, MD Jerene Bears, MD 07/15/2015 11:07:59 AM This report has been signed electronically. Number of Addenda: 0

## 2015-07-15 NOTE — Progress Notes (Signed)
A/ox3, pleased with MAC, report to RN 

## 2015-07-15 NOTE — Patient Instructions (Signed)
YOU HAD AN ENDOSCOPIC PROCEDURE TODAY AT THE [facility]:   Refer to the procedure report that was given to you for any specific questions about what was found during the examination.  If the procedure report does not answer your questions, please call your gastroenterologist to clarify.  If you requested that your care partner not be given the details of your procedure findings, then the procedure report has been included in a sealed envelope for you to review at your convenience later.  YOU SHOULD EXPECT: Some feelings of bloating in the abdomen. Passage of more gas than usual.  Walking can help get rid of the air that was put into your GI tract during the procedure and reduce the bloating. If you had a lower endoscopy (such as a colonoscopy or flexible sigmoidoscopy) you may notice spotting of blood in your stool or on the toilet paper. If you underwent a bowel prep for your procedure, you may not have a normal bowel movement for a few days.  Please Note:  You might notice some irritation and congestion in your nose or some drainage.  This is from the oxygen used during your procedure.  There is no need for concern and it should clear up in a day or so.  SYMPTOMS TO REPORT IMMEDIATELY:   Following lower endoscopy (colonoscopy or flexible sigmoidoscopy):  Excessive amounts of blood in the stool  Significant tenderness or worsening of abdominal pains  Swelling of the abdomen that is new, acute  Fever of 100F or higher   Following upper endoscopy (EGD)  Vomiting of blood or coffee ground material  New chest pain or pain under the shoulder blades  Painful or persistently difficult swallowing  New shortness of breath  Fever of 100F or higher  Black, tarry-looking stools  For urgent or emergent issues, a gastroenterologist can be reached at any hour by calling [phone].   DIET: Your first meal following the procedure should be a small meal and then it is ok to progress to  your normal diet. Heavy or fried foods are harder to digest and may make you feel nauseous or bloated.  Likewise, meals heavy in dairy and vegetables can increase bloating.  Drink plenty of fluids but you should avoid alcoholic beverages for 24 hours.  ACTIVITY:  You should plan to take it easy for the rest of today and you should NOT DRIVE or use heavy machinery until tomorrow (because of the sedation medicines used during the test).    FOLLOW UP: Our staff will call the number listed on your records the next business day following your procedure to check on you and address any questions or concerns that you may have regarding the information given to you following your procedure. If we do not reach you, we will leave a message.  However, if you are feeling well and you are not experiencing any problems, there is no need to return our call.  We will assume that you have returned to your regular daily activities without incident.  If any biopsies were taken you will be contacted by phone or by letter within the next 1-3 weeks.  Please call us at [phone] if you have not heard about the biopsies in 3 weeks.    SIGNATURES/CONFIDENTIALITY: You and/or your care partner have signed paperwork which will be entered into your electronic medical record.  These signatures attest to the fact that that the information above on your After Visit Summary has been reviewed   and is understood.  Full responsibility of the confidentiality of this discharge information lies with you and/or your care-partner. 

## 2015-07-15 NOTE — Progress Notes (Signed)
Called to room to assist during endoscopic procedure.  Patient ID and intended procedure confirmed with present staff. Received instructions for my participation in the procedure from the performing physician.  

## 2015-07-15 NOTE — Op Note (Signed)
[facility] Patient Name: [name] [name] Procedure Date: 07/15/2015 10:22 AM MRN: [id number] Endoscopist: Jerene Bears , MD Age: 75 Referring MD:  Date of Birth: 09/08/1940 Gender: Female Procedure:                Colonoscopy Indications:              Chronic diarrhea, Rectal bleeding Medicines:                Monitored Anesthesia Care Procedure:                Pre-Anesthesia Assessment:                           - Prior to the procedure, a History and Physical                            was performed, and patient medications and                            allergies were reviewed. The patient's tolerance of                            previous anesthesia was also reviewed. The risks                            and benefits of the procedure and the sedation                            options and risks were discussed with the patient.                            All questions were answered, and informed consent                            was obtained. Prior Anticoagulants: The patient has                            taken no previous anticoagulant or antiplatelet                            agents. ASA Grade Assessment: III - A patient with                            severe systemic disease. After reviewing the risks                            and benefits, the patient was deemed in                            satisfactory condition to undergo the procedure.                           After obtaining informed consent, the colonoscope  was passed under direct vision. Throughout the                            procedure, the patient's blood pressure, pulse, and                            oxygen saturations were monitored continuously. The                            Model PCF-H190L [id number]) scope was introduced                            through the anus and advanced to the the terminal                            ileum. The colonoscopy was performed without                           difficulty. The patient tolerated the procedure                            well. The quality of the bowel preparation was                            good. Ileocolonic anastomosis and remains colon                            were photographed. Scope In: 10:42:10 AM Scope Out: 10:52:23 AM Scope Withdrawal Time: 0 hours 7 minutes 50 seconds  Total Procedure Duration: 0 hours 10 minutes 13 seconds  Findings:      The digital rectal exam findings include thrombosed internal hemorrhoids       and internal hemorrhoids that prolapse with straining, but require       manual replacement into the anal canal (Grade III).      There was evidence of a prior surgical anastomosis at the hepatic       flexure. This was patent and was characterized by healthy appearing       mucosa. The anastomosis was traversed.      The neo-terminal ileum appeared normal.      Multiple small-mouthed diverticula were found in the entire colon.      The exam was otherwise without abnormality.      Biopsies for histology were taken with a cold forceps from the right       colon and left colon for evaluation of microscopic colitis. Complications:            No immediate complications. Estimated Blood Loss:     Estimated blood loss was minimal. Impression:               - Thrombosed internal hemorrhoids and internal                            hemorrhoids that prolapse with straining, but                            require manual replacement into the  anal canal                            (Grade III) found on digital rectal exam. This is                            felt to be the explanation of recent rectal                            bleeding.                           - The examined portion of the ileum was normal.                           - Patent surgical anastomosis at 60 cm,                            characterized by healthy appearing mucosa.                           - Mild diverticulosis in  the entire examined colon.                           - The examination was otherwise normal.                           - Biopsies were taken with a cold forceps from the                            right colon and left colon for evaluation of                            microscopic colitis. Recommendation:           - Patient has a contact number available for                            emergencies. The signs and symptoms of potential                            delayed complications were discussed with the                            patient. Return to normal activities tomorrow.                            Written discharge instructions were provided to the                            patient.                           - Resume previous diet.                           -  Continue present medications.                           - Await pathology results.                           - Repeat colonoscopy in 5 years for surveillance                            based on personal history of colon cancer. Procedure Code(s):        --- Professional ---                           [phone], Colonoscopy, flexible; with biopsy, single                            or multiple CPT copyright 2016 [employer]. All rights reserved. [name]. Hilarie Fredrickson, MD Jerene Bears, MD 07/15/2015 11:15:30 AM This report has been signed electronically. Number of Addenda: 0

## 2015-07-16 ENCOUNTER — Telehealth: Payer: Self-pay

## 2015-07-16 ENCOUNTER — Other Ambulatory Visit: Payer: Self-pay | Admitting: Radiology

## 2015-07-16 ENCOUNTER — Other Ambulatory Visit: Payer: Self-pay | Admitting: Physician Assistant

## 2015-07-16 NOTE — Telephone Encounter (Signed)
  Follow up Call-  Call back number 07/15/2015 08/21/2013  Post procedure Call Back phone  # [phone]  Permission to leave phone message Yes Yes    Patient was called for follow up after procedure on 07/15/2015. Patients husband answered all questions and reports that Kess has returned to her normal daily activities without difficulty.

## 2015-07-19 ENCOUNTER — Encounter (HOSPITAL_COMMUNITY): Payer: Self-pay

## 2015-07-19 ENCOUNTER — Encounter: Payer: Self-pay | Admitting: Internal Medicine

## 2015-07-19 ENCOUNTER — Ambulatory Visit (HOSPITAL_COMMUNITY)
Admission: RE | Admit: 2015-07-19 | Discharge: 2015-07-19 | Disposition: A | Discharge status: Home / Self Care | Payer: Medicare Other | Source: Ambulatory Visit | Attending: Interventional Radiology | Admitting: Interventional Radiology

## 2015-07-19 DIAGNOSIS — K7689 Other specified diseases of liver: Secondary | ICD-10-CM | POA: Diagnosis not present

## 2015-07-19 DIAGNOSIS — K769 Liver disease, unspecified: Secondary | ICD-10-CM

## 2015-07-19 LAB — PROTIME-INR
INR: 1.39 (ref 0.00–1.49)
Prothrombin Time: 16.7 seconds — ABNORMAL HIGH (ref 11.6–15.2)

## 2015-07-19 LAB — CBC
HEMATOCRIT: 35.2 % — AB (ref 36.0–46.0)
Hemoglobin: 11.2 g/dL — ABNORMAL LOW (ref 12.0–15.0)
MCH: 29.8 pg (ref 26.0–34.0)
MCHC: 31.8 g/dL (ref 30.0–36.0)
MCV: 93.6 fL (ref 78.0–100.0)
PLATELETS: 171 10*3/uL (ref 150–400)
RBC: 3.76 MIL/uL — AB (ref 3.87–5.11)
RDW: 16.1 % — ABNORMAL HIGH (ref 11.5–15.5)
WBC: 4.7 10*3/uL (ref 4.0–10.5)

## 2015-07-19 LAB — GLUCOSE, CAPILLARY: Glucose-Capillary: 93 mg/dL (ref 65–99)

## 2015-07-19 LAB — APTT: aPTT: 38 seconds — ABNORMAL HIGH (ref 24–37)

## 2015-07-19 MED ORDER — FENTANYL CITRATE (PF) 100 MCG/2ML IJ SOLN
INTRAMUSCULAR | Status: AC | PRN
  Administered 2015-07-19 (×2): 25 ug via INTRAVENOUS

## 2015-07-19 MED ORDER — SODIUM CHLORIDE 0.9 % IV SOLN
INTRAVENOUS | Status: DC
  Administered 2015-07-19: 12:00:00 via INTRAVENOUS

## 2015-07-19 MED ORDER — FENTANYL CITRATE (PF) 100 MCG/2ML IJ SOLN
INTRAMUSCULAR | Status: AC
  Filled 2015-07-19: qty 4

## 2015-07-19 MED ORDER — MIDAZOLAM HCL 2 MG/2ML IJ SOLN
INTRAMUSCULAR | Status: AC
  Filled 2015-07-19: qty 6

## 2015-07-19 MED ORDER — MIDAZOLAM HCL 2 MG/2ML IJ SOLN
INTRAMUSCULAR | Status: AC | PRN
  Administered 2015-07-19 (×2): 1 mg via INTRAVENOUS

## 2015-07-19 NOTE — H&P (Signed)
Referring Physician(s): [name],Jay  Supervising Physician: Daryll Brod  Chief Complaint: Liver lesions   Subjective: Patient familiar to IR service from recent consultation with Dr. Laurence Ferrari on 07/13/15 regarding treatment options for recently noted liver lesions. 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 While the imaging characteristics are suggestive of hepatocellular cancer, particularly for the largest 1.4 cm lesion, it is somewhat atypical to have 3 small lesions appear simultaneously in disparate locations. Additionally, she has a significant past history of cancer including breast and colon cancer within the last 10 years. She also has prior history of bladder cancer 2001. Delayed metastasis, while rare, is also a consideration. Finally, given her age and numerous medical comorbidities, she may have a difficult time tolerating any liver directed therapy. We need to make very sure exactly what we are dealing with before w embark on any possible therapeutic intervention. She presents today for ultrasound-guided liver lesion biopsy for further evaluation. She currently denies fever, headache, chest pain, dyspnea, cough, back pain, nausea, or vomiting . She does have some mild generalized abdominal soreness, fatigue and decreased appetite with occasional blood in stools.   Allergies: Review of patient's allergies indicates no known allergies.  Medications: Prior to Admission medications   Medication Sig Start Date End Date Taking? Authorizing Provider  Calcium Carbonate-Vitamin D (CALCIUM + D PO) Take 1 tablet by mouth daily.   Yes Historical Provider, MD  nitroGLYCERIN (NITRODUR - DOSED IN MG/24 HR) 0.2 mg/hr patch Reported on 07/02/2015 01/17/14  Yes Historical Provider, MD  aspirin EC 81 MG tablet Take 81 mg by mouth at bedtime.     Historical Provider, MD  diphenoxylate-atropine (LOMOTIL) 2.5-0.025 MG tablet Take 1 tablet by mouth 3 (three) times daily as needed for diarrhea or loose stools. 06/21/15   Jerene Bears, MD  donepezil (ARICEPT) 10 MG tablet Take 1 tablet (10 mg total) by mouth at bedtime. 06/23/15   [name], MD  Fe Fum-FePoly-Vit C-Vit B3 (INTEGRA) 62.5-62.5-40-3 MG CAPS TAKE 1 CAPSULE BY MOUTH DAILY. Patient not taking: Reported on 07/13/2015 12/25/14   Jerene Bears, MD  fish oil-omega-3 fatty acids 1000 MG capsule Take 1 g by mouth daily.     Historical Provider, MD  furosemide (LASIX) 40 MG tablet Take 1 tablet (40 mg total) by mouth daily. <MUST MAKE APPOINTMENT FOR REFILLS> 05/18/15   Lorretta Harp, MD  glucose blood test strip 1 each by Other route as needed. Use as instructed     Historical Provider, MD  Insulin Isophane Human (NOVOLIN N RELION [place]) Inject 60-65 Units into the skin 2 (two) times daily.     Historical Provider, MD  insulin regular (NOVOLIN R,HUMULIN R) 100 units/mL injection Inject 15 Units into the skin 3 (three) times daily before meals.    Historical Provider, MD  meclizine (ANTIVERT) 25 MG tablet Take 1 tablet (25 mg total) by mouth every 4 (four) hours as needed for dizziness. 05/13/14   [name], MD  memantine (NAMENDA) 10 MG tablet Take 1 tablet (10 mg total) by mouth 2 (two) times daily. 06/23/15   [name], MD  metFORMIN (GLUCOPHAGE-XR) 500 MG 24 hr tablet Take 1,000 mg by mouth 2 (two) times daily. 04/14/13   Historical Provider, MD  metoprolol tartrate (LOPRESSOR) 25 MG tablet Take 1 tablet (25 mg total) by mouth 2 (  two) times daily. <MUST MAKE APPOINTMENT FOR REFILLS> 05/18/15   Lorretta Harp, MD  omeprazole (PRILOSEC) 20 MG capsule TAKE 1 CAPSULE TWICE DAILY (MAKE AN OFFICE VISIT FOR FURTHER REFILLS) 06/14/15   Jerene Bears, MD  ondansetron (ZOFRAN-ODT) 4 MG disintegrating tablet Dissolve one tablet in mouth every eight hours as needed for nausea or vomiting. 08/10/14   Jerene Bears, MD  potassium chloride SA (K-DUR,KLOR-CON) 20 MEQ tablet Take 1 tablet (20 mEq total) by mouth daily. <MUST MAKE APPOINTMENT FOR REFILLS> 05/18/15   Lorretta Harp, MD  RELION INSULIN SYRINGE 1ML/31G 31G X 5/16" 1 ML MISC  12/08/11   Historical Provider, MD  rifaximin (XIFAXAN) 550 MG TABS tablet Take 1 tablet (550 mg total) by mouth 2 (two) times daily. 07/02/15   Jerene Bears, MD  rosuvastatin (CRESTOR) 10 MG tablet TAKE 1/2 TABLET EVERY DAY 10/27/14   [name], MD  sucralfate (CARAFATE) 1 GM/10ML suspension Take 10 mLs (1 g total) by mouth 4 (four) times daily. 07/15/15   Jerene Bears, MD  traMADol (ULTRAM) 50 MG tablet Take 1 tablet (50 mg total) by mouth every 6 (six) hours as needed. 03/11/15   [name], MD     Vital Signs:BP 113/62, heart rate 76, temp 97.7, respirations 16, oxygen saturation 99% room air  Physical Exam patient awake, alert. Chest clear to auscultation bilaterally. Heart with regular rate and rhythm. Abdomen soft, positive bowel sounds, mild generalized tenderness. Lower extremities with no significant edema.  Imaging:  No results found.  Labs:  CBC:  Recent Labs  08/10/14 1415 09/07/14 1337 07/02/15 1000  WBC 7.5 6.2 4.9  HGB 11.9* 11.2* 11.4*  HCT 34.8* 33.9* 33.9*  PLT 177.0 163 172.0    COAGS:  Recent Labs  08/10/14 1415 07/02/15 1000  INR 1.1* 1.4*    BMP:  Recent Labs  08/10/14 1415 09/07/14 1337 06/30/15 1224  NA 139 141  --   K 4.0 3.8  --   CL 102  --   --   CO2 31 20*  --   GLUCOSE 109* 116  --   BUN 14 14.2 13  CALCIUM 9.8 9.6  --   CREATININE 0.80 0.8 0.98    LIVER FUNCTION TESTS:  Recent Labs  08/10/14 1415 09/07/14 1337 06/21/15 0910  BILITOT 1.0 0.93 1.0  AST 37 37* 34  ALT 36* 25 19  ALKPHOS 144* 113 97  PROT 7.6 7.1 6.9  ALBUMIN 3.6 3.2* 3.4*    Assessment and Plan: Patient with past medical history significant for colon, breast and bladder cancers. Also with GERD, diabetes, and NASH  cirrhosis with new liver lesions noted on recent imaging. Seen in consultation recently by Dr. Laurence Ferrari and request now received for liver lesion biopsy prior to potential liver directed therapy.Risks and benefits discussed with the patient/family including, but not limited to bleeding, infection, damage to adjacent structures or low yield requiring additional tests.All of the patient's questions were answered, patient is agreeable to proceed.Consent signed and in chart.     Electronically Signed: D. Rowe Robert 07/19/2015, 11:50 AM   I spent a total of 15 minutes at the the patient's bedside AND on the patient's hospital floor or unit, greater than 50% of which was counseling/coordinating care for US guided liver lesion biopsy

## 2015-07-19 NOTE — Discharge Instructions (Signed)
°Liver Biopsy °The liver is a large organ in the upper right-hand side of your abdomen. A liver biopsy is a procedure in which a tissue sample is taken from the liver and examined under a microscope. The procedure is done to confirm a suspected problem. °There are three types of liver biopsies: °· Percutaneous. In this type, an incision is made in your abdomen. The sample is removed through the incision with a needle. °· Laparoscopic. In this type, several incisions are made in the abdomen. A tiny camera is passed through one of the incisions to help guide the health care provider. The sample is removed through the other incision or incisions. °· Transjugular. In this type, an incision is made in the neck. A tube is passed through the incision to the liver. The sample is removed through the tube with a needle. °LET YOUR HEALTH CARE PROVIDER KNOW ABOUT: °· Any allergies you have. °· All medicines you are taking, including vitamins, herbs, eye drops, creams, and over-the-counter medicines. °· Previous problems you or members of your family have had with the use of anesthetics. °· Any blood disorders you have. °· Previous surgeries you have had. °· Medical conditions you have. °· Possibility of pregnancy, if this applies. °RISKS AND COMPLICATIONS °Generally, this is a safe procedure. However, problems can occur and include: °· Bleeding. °· Infection. °· Bruising. °· Collapsed lung. °· Leak of digestive juices (bile) from the liver or gallbladder. °· Problems with heart rhythm. °· Pain at the biopsy site or in the right shoulder. °· Low blood pressure (hypotension). °· Injury to nearby organs or tissues. °BEFORE THE PROCEDURE °· Your health care provider may do some blood or urine tests. These will help your health care provider learn how well your kidneys and liver are working and how well your blood clots. °· Ask your health care provider if you will be able to go home the day of the procedure. Arrange for someone to  take you home and stay with you for at least 24 hours. °· Do not eat or drink anything after midnight on the night before the procedure or as directed by your health care provider. °· Ask your health care provider about: °¨ Changing or stopping your regular medicines. This is especially important if you are taking diabetes medicines or blood thinners. °¨ Taking medicines such as aspirin and ibuprofen. These medicines can thin your blood. Do not take these medicines before your procedure if your health care provider asks you not to. °PROCEDURE °Regardless of the type of biopsy that will be done, you will have an IV line placed. Through this line, you will receive fluids and medicine to relax you. If you will be having a laparoscopic biopsy, you may also receive medicine through this line to make you sleep during the procedure (general anesthetic). °Percutaneous Liver Biopsy °· You will positioned on your back, with your right hand over your head. °· A health care provider will locate your liver by tapping and pressing on the right side of your abdomen or with the help of an ultrasound machine or CT scan. °· An area at the bottom of your last right rib will be numbed. °· An incision will be made in the numbed area. °· The biopsy needle will be inserted into the incision. °· Several samples of liver tissue will be taken with the biopsy needle. You will be asked to hold your breath as each sample is taken. °Laparoscopic Liver Biopsy °· You will be   positioned on your back. °· Several small incisions will be made in your abdomen. °· Your doctor will pass a tiny camera through one incision. The camera will allow the liver to be viewed on a TV monitor in the operating room. °· Tools will be passed through the other incision or incisions. These tools will be used to remove samples of liver tissue. °Transjugular Liver Biopsy °· You will be positioned on your back on an X-ray table, with your head turned to your left. °· An  area on your neck just over your jugular vein will be numbed. °· An incision will be made in the numbed area. °· A tiny tube will be inserted through the incision. It will be pushed through the jugular vein to a blood vessel in the liver called the hepatic vein. °· Dye will be inserted through the tube, and X-rays will be taken. The dye will make the blood vessels in the liver light up on the X-rays. °· The biopsy needle will be pushed through the tube until it reaches the liver. °· Samples of liver tissue will be taken with the biopsy needle. °· The needle and the tube will be removed. °After the samples are obtained, the incision or incisions will be closed. °AFTER THE PROCEDURE °· You will be taken to a recovery area. °· You may have to lie on your right side for 1-2 hours. This will prevent bleeding from the biopsy site. °· Your progress will be watched. Your blood pressure, pulse, and the biopsy site will be checked often. °· You may have some pain or feel sick. If this happens, tell your health care provider. °· As you begin to feel better, you will be offered ice and beverages. °· You may be allowed to go home when the medicines have worn off and you can walk, drink, eat, and use the bathroom. °  °This information is not intended to replace advice given to you by your health care provider. Make sure you discuss any questions you have with your health care provider. °  °Document Released: 07/15/2003 Document Revised: 05/15/2014 Document Reviewed: 06/20/2013 °Elsevier Interactive Patient Education ©2016 [employer] °Liver Biopsy, Care After °Refer to this sheet in the next few weeks. These instructions provide you with information on caring for yourself after your procedure. Your health care provider may also give you more specific instructions. Your treatment has been planned according to current medical practices, but problems sometimes occur. Call your health care provider if you have any problems or  questions after your procedure. °WHAT TO EXPECT AFTER THE PROCEDURE °After your procedure, it is typical to have the following: °· A small amount of discomfort in the area where the biopsy was done and in the right shoulder or shoulder blade. °· A small amount of bruising around the area where the biopsy was done and on the skin over the liver. °· Sleepiness and fatigue for the rest of the day. °HOME CARE INSTRUCTIONS  °· Rest at home for 1-2 days or as directed by your health care provider. °· Have a friend or family member stay with you for at least 24 hours. °· Because of the medicines used during the procedure, you should not do the following things in the first 24 hours: °· Drive. °· Use machinery. °· Be responsible for the care of other people. °· Sign legal documents. °· Take a bath or shower. °· There are many different ways to close and cover an incision, including stitches,   skin glue, and adhesive strips. Follow your health care provider's instructions on: °· Incision care. °· Bandage (dressing) changes and removal. °· Incision closure removal. °· Do not drink alcohol in the first week. °· Do not lift more than 5 pounds or play contact sports for 2 weeks after this test. °· Take medicines only as directed by your health care provider. Do not take medicine containing aspirin or non-steroidal anti-inflammatory medicines such as ibuprofen for 1 week after this test. °· It is your responsibility to get your test results. °SEEK MEDICAL CARE IF:  °· You have increased bleeding from an incision that results in more than a small spot of blood. °· You have redness, swelling, or increasing pain in any incisions. °· You notice a discharge or a bad smell coming from any of your incisions. °· You have a fever or chills. °SEEK IMMEDIATE MEDICAL CARE IF:  °· You develop swelling, bloating, or pain in your abdomen. °· You become dizzy or faint. °· You develop a rash. °· You are nauseous or vomit. °· You have difficulty  breathing, feel short of breath, or feel faint. °· You develop chest pain. °· You have problems with your speech or vision. °· You have trouble balancing or moving your arms or legs. °  °This information is not intended to replace advice given to you by your health care provider. Make sure you discuss any questions you have with your health care provider. °  °Document Released: 11/11/2004 Document Revised: 05/15/2014 Document Reviewed: 06/20/2013 °Elsevier Interactive Patient Education ©2016 [employer] °Moderate Conscious Sedation, Adult °Sedation is the use of medicines to promote relaxation and relieve discomfort and anxiety. Moderate conscious sedation is a type of sedation. Under moderate conscious sedation you are less alert than normal but are still able to respond to instructions or stimulation. Moderate conscious sedation is used during short medical and dental procedures. It is milder than deep sedation or general anesthesia and allows you to return to your regular activities sooner. °LET YOUR HEALTH CARE PROVIDER KNOW ABOUT:  °· Any allergies you have. °· All medicines you are taking, including vitamins, herbs, eye drops, creams, and over-the-counter medicines. °· Use of steroids (by mouth or creams). °· Previous problems you or members of your family have had with the use of anesthetics. °· Any blood disorders you have. °· Previous surgeries you have had. °· Medical conditions you have. °· Possibility of pregnancy, if this applies. °· Use of cigarettes, alcohol, or illegal drugs. °RISKS AND COMPLICATIONS °Generally, this is a safe procedure. However, as with any procedure, problems can occur. Possible problems include: °· Oversedation. °· Trouble breathing on your own. You may need to have a breathing tube until you are awake and breathing on your own. °· Allergic reaction to any of the medicines used for the procedure. °BEFORE THE PROCEDURE °· You may have blood tests done. These tests can help show  how well your kidneys and liver are working. They can also show how well your blood clots. °· A physical exam will be done.   °· Only take medicines as directed by your health care provider. You may need to stop taking medicines (such as blood thinners, aspirin, or nonsteroidal anti-inflammatory drugs) before the procedure.   °· Do not eat or drink at least 6 hours before the procedure or as directed by your health care provider. °· Arrange for a responsible adult, family member, or friend to take you home after the procedure. He or she should stay with   you for at least 24 hours after the procedure, until the medicine has worn off. °PROCEDURE  °· An intravenous (IV) catheter will be inserted into one of your veins. Medicine will be able to flow directly into your body through this catheter. You may be given medicine through this tube to help prevent pain and help you relax. °· The medical or dental procedure will be done. °AFTER THE PROCEDURE °· You will stay in a recovery area until the medicine has worn off. Your blood pressure and pulse will be checked.   °·  Depending on the procedure you had, you may be allowed to go home when you can tolerate liquids and your pain is under control. °  °This information is not intended to replace advice given to you by your health care provider. Make sure you discuss any questions you have with your health care provider. °  °Document Released: 01/17/2001 Document Revised: 05/15/2014 Document Reviewed: 12/30/2012 °Elsevier Interactive Patient Education ©2016 [employer] ° °

## 2015-07-19 NOTE — Procedures (Signed)
Successful [place] Rt liver mass 18 g core bx No comp Stable Path pending Full report in PACS

## 2015-07-20 ENCOUNTER — Telehealth: Payer: Self-pay | Admitting: Internal Medicine

## 2015-07-20 NOTE — Telephone Encounter (Signed)
I have spoken to a [employer] who states that patient's Xifaxan was approved through their company until 09/12/15. They have asked that patient apply for assistance from Medicare's "low income subsidy assistance" by calling [phone]. They state if patient does not qualify for Medicare's assistance, that she may send the denial letter to them and [facility] will provide assistance through the rest of the year. I have spoken to [name] [name] (patient's husband) about the information above as he takes care off [name] [name] [facility] and he verbalizes understanding. He will call me back to let me know what he finds out.

## 2015-07-26 ENCOUNTER — Telehealth: Payer: Self-pay | Admitting: *Deleted

## 2015-07-26 NOTE — Telephone Encounter (Signed)
Dr Hilarie Fredrickson asked me to contact patient regarding a call he received from pathologist after liver biopsy in radiology. Pathology is inconclusive for [facility], therefore slides are being sent to [facility] to specialized liver pathology. Once we get a response from them, we will advise patient on findings. Both she and her husband verbalize understanding.

## 2015-07-27 ENCOUNTER — Encounter: Payer: Self-pay | Admitting: Oncology

## 2015-07-27 ENCOUNTER — Telehealth: Payer: Self-pay | Admitting: Oncology

## 2015-07-27 ENCOUNTER — Ambulatory Visit (HOSPITAL_BASED_OUTPATIENT_CLINIC_OR_DEPARTMENT_OTHER): Payer: Medicare Other | Admitting: Oncology

## 2015-07-27 ENCOUNTER — Encounter: Payer: Self-pay | Admitting: *Deleted

## 2015-07-27 VITALS — BP 122/60 | HR 82 | Temp 98.9°F | Resp 18 | Ht 65.0 in | Wt 173.7 lb

## 2015-07-27 DIAGNOSIS — K7460 Unspecified cirrhosis of liver: Secondary | ICD-10-CM

## 2015-07-27 DIAGNOSIS — F0390 Unspecified dementia without behavioral disturbance: Secondary | ICD-10-CM

## 2015-07-27 DIAGNOSIS — C50111 Malignant neoplasm of central portion of right female breast: Secondary | ICD-10-CM | POA: Diagnosis not present

## 2015-07-27 NOTE — Telephone Encounter (Signed)
Gave and printed appt sched and avs fo rpt; for NOV  °

## 2015-07-27 NOTE — Progress Notes (Signed)
[facility] Patient Consult   Referring MD: [name] [name] 75 y.o.  1941/02/25    Reason for Referral: Cirrhosis, liver lesions  HPI: [name] has been diagnosed with  NASH related cirrhosis and is followed by Dr. Hilarie [name]. an abdominal ultrasound 03/23/2015 revealed changes of cirrhosis. A hypoechoic lesion was noted in the central liver. A 1.7 cm peripancreatic node was seen near the porta hepatis. She was referred for a follow-up MRI of the abdomen 07/05/2015. 3 discrete hypervascular lesions were noted in the liver, largest measured 1.4 cm in the dome, segment 8. The dome of the liver was not included on a previous MRI in 2012, but the other lesions are new. A few scattered tiny lesions were compatible with dysplastic nodules. 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 Upper normal spleen size.  She was referred to interventional radiology. To consider an intervention procedure. Dr. Laurence [name] recommended a biopsy and observation. She was taken to an ultrasound-guided biopsy of a central right liver lesion was obtained on 07/19/2015. The final pathology report is pending. I discussed the case with Dr. Tresa [name]. She indicates the biopsy does not confirm hepatocellular carcinoma. No evidence of metastatic carcinoma. The differential diagnosis includes a dysplastic nodule. The case has been referred for an outside expert opinion.    Past Medical History  Diagnosis Date  . NAFLD Cirrhosis        . Hiatal hernia   . GERD (gastroesophageal reflux disease)   . Diverticulosis of colon (without mention of hemorrhage) 09/26/2004,09/10/2006  . Hypertension   . Hyperlipidemia   . Hyperglycemia   . IBS (irritable bowel syndrome)   . Allergy     environmental  . Sleep apnea       . G2 P2    . Arthritis     sees Dr. Hurley [name]   . Adenocarcinoma, breast (HCC)-Right, stage III a  June 2001       . 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 Malignant neoplasm of colon, stage II  07/08/2003  . Diabetes mellitus     sees Dr. Chalmers [name]   . Gastritis   . Hemorrhoids   . Barrett esophagus 12/20/11  . Inflammatory polyps of colon ([place])   . Dementia    . Iron deficiency anemia   . Diastolic heart failure      Past Surgical History  Procedure Laterality Date  . Sigmoid colectomy 2005      for cancer  . Incisonal hernia    . Mastectomy and biopsy    . Bladder cancer 2001    . Squamous cell ca  r  calf  2008    . Carpal tunnel release    .Marland Kitchen Esophageal dilation    . Colonoscopy    . Colon surgery  1995    for diverticulitis  . Breast surgery  2001    lymph nodes removed and rt mastectomy    Medications: Reviewed  Allergies: No Known Allergies  Family history: Her son has esophagus cancer. Father had lung cancer and died at age 64.   Social History:   She lives with her husband and [place]. She worked as a Theme park manager. She quit smoking cigarettes 30 years ago. Rare alcohol use. No transfusion history. No risk factor for HIV or hepatitis.      ROS:   Positives include:10 pound weight loss over the  past 8 months, anorexia, episodes of "dry heaves "prior to removal of a polyp from the stomach on 07/15/2015, loose stools, rectal bleeding secondary to hemorrhoids, exertional dyspnea, malaise   A complete ROS was otherwise negative.  Physical Exam:  Blood pressure 122/60, pulse 82, temperature 98.9 F (37.2 C), temperature source Oral, resp. rate 18, height 5\' 5"  (1.651 m), weight 173 lb 11.2 oz (78.79 kg), SpO2 100 %.  HEENT: Oropharynx without visible mass, neck without mass Lungs: Clear bilaterally  Cardiac: Regular rate and rhythmen GI: No hepatosplenomegaly, no apparent ascites, nontender   Vascular: No leg edema Lymph nodes: No cervical, supraclavicular, axillary, or inguinal nodes  Skin: Telangiectasias at the upper back Breast: Status post  right mastectomy, radiation telangiectasias surrounding the mastectomy scar. No evidence for chest wall tumor recurrence. Left breast without mass. Neurologic: Alert and oriented, the motor exam appears intact in the upper and lower extremities      Imaging:  MRI 07/05/2015-images reviewed   Assessment/Plan:   1. NASH related cirrhosis  2. MRI liver 07/05/2015 with 3 hypervascular lesions concerning for multifocal hepatocellular carcinoma  Ultrasound-guided biopsy of a right liver lesion on 07/19/2015-pathology pending   3.  Memory loss secondary to dementia versus hepatic encephalopathy  4.  History of stage III right-sided breast cancer diagnosed in 2001  5.  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  Diabetes  7.  Stage II (T3 N0), right colon, 2005  8.  Removal of a benign greater curvature polyp 07/15/2015   Disposition:   [name] has cirrhosis. An MRI of the liver reveals 3 hypervascular lesions concerning for hepatocellular carcinoma. A biopsy on 07/19/2015 did not confirm hepatocellular carcinoma. The pathology has been sent out for an expert opinion.  [name] has cirrhosis and multiple comorbid conditions. She does not appear to be a surgical candidate and may not be a candidate for a hepatic directed percutaneous intervention. We will follow-up on the pathology report. If hepatocellular carcinoma is not confirmed then observation and follow-up imaging is appropriate. I will defer follow-up imaging to Drs. McCullough and [name].  [place]. She will return for an office visit here in November 2017 for routine follow-up of breast cancer. I am available to see her in the interim as needed.   Approximately 50 minutes were spent with the patient today. The majority of the time was used for counseling and coordination of care.  [name] Coder, MD  07/27/2015, 2:21 PM

## 2015-07-27 NOTE — Progress Notes (Signed)
Oncology Nurse Navigator Documentation  Oncology Nurse Navigator Flowsheets 07/27/2015  Navigator Location [facility]  Navigator Encounter Type Initial MedOnc  Abnormal Finding Date 06/25/2015  Patient Visit Type MedOnc;Initial  Treatment Phase Abnormal Scans  Barriers/Navigation Needs Education  Education Other--cirrhosis can result in dysplastic nodules that are not cancer, but look like HCC--will call her with final path analysis  Interventions Education Method  Education Method Verbal;Teach-back  Support Groups/Services Other--Calendar of events at [facility]  Acuity Level 1  Time Spent with Patient [name] [name] understands that pathology has been sent out for further review by Dr. Tresa Moore. She will be called with final analysis. At present time does not appear to be cancer. IF path is negative she will have MRI in 4 months to follow. She has requested to transfer all her oncology care to Dr. Benay Spice at this time.

## 2015-07-29 ENCOUNTER — Encounter: Payer: Self-pay | Admitting: Internal Medicine

## 2015-07-30 ENCOUNTER — Other Ambulatory Visit: Payer: Self-pay | Admitting: Cardiovascular Disease

## 2015-07-30 NOTE — Telephone Encounter (Signed)
Rx request sent to pharmacy.  

## 2015-08-04 ENCOUNTER — Telehealth: Payer: Self-pay | Admitting: Internal Medicine

## 2015-08-04 NOTE — Telephone Encounter (Signed)
Allegra should be okay

## 2015-08-04 NOTE — Telephone Encounter (Signed)
Pt aware.

## 2015-08-04 NOTE — Telephone Encounter (Signed)
Pt wants to know if it is ok for her to take allegra with her liver issues. Please advise.

## 2015-08-10 DIAGNOSIS — C672 Malignant neoplasm of lateral wall of bladder: Secondary | ICD-10-CM | POA: Diagnosis not present

## 2015-08-10 DIAGNOSIS — Z0000 Encounter for general adult medical examination without abnormal findings: Secondary | ICD-10-CM | POA: Diagnosis not present

## 2015-08-11 ENCOUNTER — Telehealth: Payer: Self-pay | Admitting: *Deleted

## 2015-08-11 ENCOUNTER — Telehealth: Payer: Self-pay

## 2015-08-11 DIAGNOSIS — K7460 Unspecified cirrhosis of liver: Secondary | ICD-10-CM

## 2015-08-11 NOTE — Telephone Encounter (Signed)
Patient notified AFP ordered.  She will come one day this or next week

## 2015-08-11 NOTE — Telephone Encounter (Signed)
-----   Message from Jerene Bears, MD sent at 08/11/2015 12:02 PM EDT ----- Regarding: RE: Follow Up yes ----- Message -----    From: Marlon Pel, RN    Sent: 08/11/2015  11:49 AM      To: Jerene Bears, MD Subject: FW: Follow Up                                  See notes from Merceda Elks.  Ok to order AFP? ----- Message -----    From: Tania Ade, RN    Sent: 08/11/2015   9:01 AM      To: Algernon Huxley, RN Subject: Follow Up                                      Vaughan Basta, Case was presented in tumor board today: I see the 6 month MRI was already decided on. Dr. Benay Spice was asking if GI will do a baseline AFP on her also.  Thanks, Manuela Schwartz

## 2015-08-11 NOTE — Telephone Encounter (Signed)
Oncology Nurse Navigator Documentation  Oncology Nurse Navigator Flowsheets 08/11/2015  Navigator Location [facility]  Navigator Encounter Type Telephone  Telephone Outgoing Call;Patient Update  Abnormal Finding Date -  Patient Visit Type -  Treatment Phase -  Barriers/Navigation Needs -Notified of tumor board discussion: pathology was negative for cancer-appears to be regenerative nodule in cirrhosis. Will rescan in 6 months. Dr. Hilarie Fredrickson will see her in 3 months. Will also check AFP level. It was suggested she see genetics counselor, but she reports already doing this several years ago and it was negative. Tells RN it was done here at [facility].  Education -  Interventions -Will request genetics follow up on this consult from past  Education Method -  Support Groups/Services -  Acuity -  Time Spent with Patient 15

## 2015-08-13 ENCOUNTER — Telehealth: Payer: Self-pay | Admitting: Internal Medicine

## 2015-08-13 ENCOUNTER — Other Ambulatory Visit: Payer: Medicare Other

## 2015-08-13 DIAGNOSIS — K7460 Unspecified cirrhosis of liver: Secondary | ICD-10-CM | POA: Diagnosis not present

## 2015-08-13 NOTE — Telephone Encounter (Signed)
[name] [name] states that he still has not heard from Medicare low subsidy assistance. I have advised that he needs to call them back before bringing any forms to [place] because we will need denial letter etc. To move forward with [facility] patient assistance. He verbalizes understanding.

## 2015-08-13 NOTE — Telephone Encounter (Signed)
Pt had labs drawn today and just wanted to clarify when her appt is scheduled. Reviewed OV schedule with pt.

## 2015-08-16 LAB — AFP TUMOR MARKER: AFP-Tumor Marker: 2.7 ng/mL (ref <6.1)

## 2015-08-19 ENCOUNTER — Telehealth: Payer: Self-pay | Admitting: Interventional Radiology

## 2015-08-19 NOTE — Telephone Encounter (Signed)
I called to make sure that [name] [name] had received the results of her biopsy given the delay as it was sent out for a second path opinion.  She is aware that the biopsies showed no evidence of hepatocellular carcinoma.  I recommend she have a f/u MRI in 3 mo.  if any of the lesions demonstrated growth over time, repeat ultrasound-guided biopsy should be considered.   Signed,  Criselda Peaches, MD  Pager: [phone] Clinic: [phone]

## 2015-08-21 ENCOUNTER — Other Ambulatory Visit: Payer: Self-pay | Admitting: Cardiovascular Disease

## 2015-08-21 ENCOUNTER — Other Ambulatory Visit: Payer: Self-pay | Admitting: Internal Medicine

## 2015-08-23 ENCOUNTER — Other Ambulatory Visit: Payer: Self-pay | Admitting: Internal Medicine

## 2015-08-23 ENCOUNTER — Encounter: Payer: Self-pay | Admitting: Family Medicine

## 2015-08-23 ENCOUNTER — Ambulatory Visit (INDEPENDENT_AMBULATORY_CARE_PROVIDER_SITE_OTHER): Payer: Medicare Other | Admitting: Family Medicine

## 2015-08-23 VITALS — BP 111/68 | HR 89 | Temp 99.3°F | Ht 65.0 in | Wt 170.0 lb

## 2015-08-23 DIAGNOSIS — I208 Other forms of angina pectoris: Secondary | ICD-10-CM | POA: Diagnosis not present

## 2015-08-23 DIAGNOSIS — J019 Acute sinusitis, unspecified: Secondary | ICD-10-CM

## 2015-08-23 DIAGNOSIS — R413 Other amnesia: Secondary | ICD-10-CM | POA: Diagnosis not present

## 2015-08-23 MED ORDER — AMOXICILLIN-POT CLAVULANATE 875-125 MG PO TABS: 1.0000 | ORAL_TABLET | Freq: Two times a day (BID) | ORAL | Status: DC

## 2015-08-23 NOTE — Progress Notes (Signed)
Pre visit review using our clinic review tool, if applicable. No additional management support is needed unless otherwise documented below in the visit note. 

## 2015-08-23 NOTE — Telephone Encounter (Signed)
Rx(s) sent to pharmacy electronically.  

## 2015-08-23 NOTE — Progress Notes (Signed)
   Subjective:    Patient ID: [name] [name], female    DOB: 11-12-1940, 75 y.o.   MRN: [id number]  HPI Here with her husband to follow up on dementia and for other issues. At our last visit 2 months ago we added Namenda 10 mg bid to the Aricept 10 mg she was taking. [name] [name] says she cannot tell if it is helping or not, and her husband agrees that her memory has not improved. However he does think that it has stabilized and that she is no worse than 2 months ago. He sees this as a positive thing since she had been steadily declining before. Also Dr. Hilarie Fredrickson recently added Xifaxan to her regimen to see if her liver problems are contributing to her cognitive issues. She has only been taking this for one week now. Also she complains of several weeks of stuffy head, sinus pressure, PND and low grade fevers. No cough.   Review of Systems  Constitutional: Positive for fever. Negative for chills and diaphoresis.  HENT: Positive for congestion, postnasal drip and sinus pressure. Negative for sore throat.   Eyes: Negative.   Respiratory: Negative.   Cardiovascular: Negative.   Neurological: Negative.   Psychiatric/Behavioral: Positive for confusion. Negative for hallucinations, dysphoric mood and agitation. The patient is not nervous/anxious.        Objective:   Physical Exam  Constitutional: She is oriented to person, place, and time. She appears well-developed and well-nourished.  HENT:  Right Ear: External ear normal.  Left Ear: External ear normal.  Nose: Nose normal.  Mouth/Throat: Oropharynx is clear and moist.  Eyes: Conjunctivae are normal.  Neck: No thyromegaly present.  Pulmonary/Chest: Effort normal and breath sounds normal.  Lymphadenopathy:    She has no cervical adenopathy.  Neurological: She is alert and oriented to person, place, and time.  Psychiatric: She has a normal mood and affect. Her behavior is normal. Thought content normal.          Assessment & Plan:  For the  dementia she will stay on Aricept and Namenda. For the sinusitis take Augmentin. [name] Morale, MD

## 2015-09-07 ENCOUNTER — Ambulatory Visit: Payer: Medicare Other | Admitting: Hematology and Oncology

## 2015-09-07 DIAGNOSIS — Z6829 Body mass index (BMI) 29.0-29.9, adult: Secondary | ICD-10-CM | POA: Diagnosis not present

## 2015-09-07 DIAGNOSIS — Z01419 Encounter for gynecological examination (general) (routine) without abnormal findings: Secondary | ICD-10-CM | POA: Diagnosis not present

## 2015-09-09 ENCOUNTER — Other Ambulatory Visit: Payer: Self-pay | Admitting: Internal Medicine

## 2015-09-16 ENCOUNTER — Telehealth: Payer: Self-pay | Admitting: Family Medicine

## 2015-09-16 NOTE — Telephone Encounter (Signed)
Spoke to Mr. Prueter.  Pt is no longer having fever (gone with antibiotic).  Has recently been to [employer].  Currently at the beach and will not be back to November (per triage note).  The only symptom the patient has is a clear runny nose.  Per husband, sometimes "runs like a faucet."  Would like prescription called to the pharmacy.  Please advise.  Thanks!

## 2015-09-16 NOTE — Telephone Encounter (Signed)
Patient Name: [name] [name]  DOB: August 16, 1940    Initial Comment Caller states wife having a lot of sinus drainage, otc meds not helping. Has liver disease   Nurse Assessment  Nurse: Raphael Gibney, RN, Vanita Ingles Date/Time (Eastern Time): 09/16/2015 11:46:44 AM  Confirm and document reason for call. If symptomatic, describe symptoms. You must click the next button to save text entered. ---Caller states spouse is having a lot of sinus drainage for several weeks. OTC medication is not helping much. She has liver disease. Had fever earlier and she saw Dr. Sarajane Jews and has completed her antibiotics.  Has the patient traveled out of the country within the last 30 days? ---No  Does the patient have any new or worsening symptoms? ---Yes  Will a triage be completed? ---Yes  Related visit to physician within the last 2 weeks? ---No  Does the PT have any chronic conditions? (i.e. diabetes, asthma, etc.) ---Yes  List chronic conditions. ---liver disease, cirrhosis; CHF  Is this a behavioral health or substance abuse call? ---No     Guidelines    Guideline Title Affirmed Question Affirmed Notes  Sinus Pain or Congestion [1] Sinus congestion (pressure, fullness) AND [2] present > 10 days    Final Disposition User   See PCP When Office is Open (within 3 days) Raphael Gibney, Therapist, sports, Vanita Ingles    Comments  Caller states they are at the beach until November and would like something called in for his wife rather than taking her to urgent care. Please call pt back regarding his wife.   Referrals  GO TO FACILITY REFUSED   Disagree/Comply: Disagree  Disagree/Comply Reason: Disagree with instructions

## 2015-09-16 NOTE — Telephone Encounter (Signed)
Spoke to the pt.  She has tried OTC Claritin but stopped it due to liver disease.  Also has tried Sudafed with no success.  Claritin did work.  Please advise.

## 2015-09-16 NOTE — Telephone Encounter (Signed)
There is nothing to prescribe for this. Tell her to try an OTC antihistamine like Claritin or Zyrtec

## 2015-09-17 NOTE — Telephone Encounter (Signed)
Mr. Groover (husband) notified for the pt to take Claritin.  Okay per Dr. Sarajane Jews.

## 2015-09-17 NOTE — Telephone Encounter (Signed)
Left a message for a return call.

## 2015-09-17 NOTE — Telephone Encounter (Signed)
I think she can safely take Claritin. Please tell her to not worry about this

## 2015-09-21 ENCOUNTER — Telehealth: Payer: Self-pay | Admitting: Family Medicine

## 2015-09-21 NOTE — Telephone Encounter (Signed)
Correct fax number is [phone]

## 2015-09-21 NOTE — Telephone Encounter (Signed)
Pt at [facility] and requesting order to be fax to [facility] [employer] [phone] phone # [phone] for 3 bras and prothesis. Pt had mastectomy on right side

## 2015-09-21 NOTE — Telephone Encounter (Signed)
I contacted [employer] (phone [phone]) to see if they have gotten any denial letters from low income subsidy. They states they have not. Matter of fact, they have made 3 different attempts to get patient to file for low income subsidy. As of 09/06/15, they mailed her a low income subsidy application as well but have not heard back from patient. Last sample of xifaxan was sent out to patient on 08/17/15 and not further rx will be sent out until the denial letter comes back.

## 2015-09-21 NOTE — Telephone Encounter (Signed)
I faxed order and left a voice message for pt with this information.

## 2015-09-21 NOTE — Telephone Encounter (Signed)
Written and ready to be faxed

## 2015-09-28 ENCOUNTER — Other Ambulatory Visit: Payer: Self-pay | Admitting: Internal Medicine

## 2015-10-01 DIAGNOSIS — E1165 Type 2 diabetes mellitus with hyperglycemia: Secondary | ICD-10-CM | POA: Diagnosis not present

## 2015-10-01 DIAGNOSIS — I10 Essential (primary) hypertension: Secondary | ICD-10-CM | POA: Diagnosis not present

## 2015-10-01 DIAGNOSIS — E7800 Pure hypercholesterolemia, unspecified: Secondary | ICD-10-CM | POA: Diagnosis not present

## 2015-10-14 ENCOUNTER — Other Ambulatory Visit: Payer: Self-pay | Admitting: Family Medicine

## 2015-10-19 ENCOUNTER — Other Ambulatory Visit (INDEPENDENT_AMBULATORY_CARE_PROVIDER_SITE_OTHER): Payer: Medicare Other

## 2015-10-19 ENCOUNTER — Encounter: Payer: Self-pay | Admitting: Internal Medicine

## 2015-10-19 ENCOUNTER — Ambulatory Visit (INDEPENDENT_AMBULATORY_CARE_PROVIDER_SITE_OTHER): Payer: Medicare Other | Admitting: Internal Medicine

## 2015-10-19 VITALS — BP 120/66 | HR 75 | Ht 65.0 in | Wt 162.0 lb

## 2015-10-19 DIAGNOSIS — K862 Cyst of pancreas: Secondary | ICD-10-CM

## 2015-10-19 DIAGNOSIS — K7469 Other cirrhosis of liver: Secondary | ICD-10-CM

## 2015-10-19 DIAGNOSIS — R630 Anorexia: Secondary | ICD-10-CM | POA: Diagnosis not present

## 2015-10-19 DIAGNOSIS — K7581 Nonalcoholic steatohepatitis (NASH): Secondary | ICD-10-CM

## 2015-10-19 DIAGNOSIS — K769 Liver disease, unspecified: Secondary | ICD-10-CM

## 2015-10-19 DIAGNOSIS — F32A Depression, unspecified: Secondary | ICD-10-CM

## 2015-10-19 DIAGNOSIS — K7689 Other specified diseases of liver: Secondary | ICD-10-CM | POA: Diagnosis not present

## 2015-10-19 DIAGNOSIS — K7460 Unspecified cirrhosis of liver: Secondary | ICD-10-CM

## 2015-10-19 DIAGNOSIS — F329 Major depressive disorder, single episode, unspecified: Secondary | ICD-10-CM

## 2015-10-19 DIAGNOSIS — I208 Other forms of angina pectoris: Secondary | ICD-10-CM

## 2015-10-19 DIAGNOSIS — R634 Abnormal weight loss: Secondary | ICD-10-CM

## 2015-10-19 DIAGNOSIS — K317 Polyp of stomach and duodenum: Secondary | ICD-10-CM

## 2015-10-19 LAB — COMPREHENSIVE METABOLIC PANEL
ALBUMIN: 4.1 g/dL (ref 3.5–5.2)
ALK PHOS: 112 U/L (ref 39–117)
ALT: 21 U/L (ref 0–35)
AST: 32 U/L (ref 0–37)
BILIRUBIN TOTAL: 1.1 mg/dL (ref 0.2–1.2)
BUN: 17 mg/dL (ref 6–23)
CO2: 27 mEq/L (ref 19–32)
CREATININE: 0.97 mg/dL (ref 0.40–1.20)
Calcium: 10.4 mg/dL (ref 8.4–10.5)
Chloride: 101 mEq/L (ref 96–112)
GFR: 59.55 mL/min — ABNORMAL LOW (ref >60.00)
Glucose, Bld: 135 mg/dL — ABNORMAL HIGH (ref 70–99)
POTASSIUM: 4.2 meq/L (ref 3.5–5.1)
SODIUM: 140 meq/L (ref 135–145)
TOTAL PROTEIN: 8.1 g/dL (ref 6.0–8.3)

## 2015-10-19 LAB — CBC WITH DIFFERENTIAL/PLATELET
BASOS ABS: 0 10*3/uL (ref 0.0–0.1)
Basophils Relative: 0.6 % (ref 0.0–3.0)
EOS PCT: 1.6 % (ref 0.0–5.0)
Eosinophils Absolute: 0.1 10*3/uL (ref 0.0–0.7)
HCT: 36 % (ref 36.0–46.0)
HEMOGLOBIN: 11.9 g/dL — AB (ref 12.0–15.0)
Lymphocytes Relative: 36.7 % (ref 12.0–46.0)
Lymphs Abs: 1.7 10*3/uL (ref 0.7–4.0)
MCHC: 33.2 g/dL (ref 30.0–36.0)
MCV: 91.9 fl (ref 78.0–100.0)
MONO ABS: 0.6 10*3/uL (ref 0.1–1.0)
MONOS PCT: 13.4 % — AB (ref 3.0–12.0)
Neutro Abs: 2.3 10*3/uL (ref 1.4–7.7)
Neutrophils Relative %: 47.7 % (ref 43.0–77.0)
Platelets: 164 10*3/uL (ref 150.0–400.0)
RBC: 3.91 Mil/uL (ref 3.87–5.11)
RDW: 16.2 % — ABNORMAL HIGH (ref 11.5–15.5)
WBC: 4.7 10*3/uL (ref 4.0–10.5)

## 2015-10-19 LAB — PROTIME-INR
INR: 1.3 ratio — AB (ref 0.8–1.0)
Prothrombin Time: 13.2 s — ABNORMAL HIGH (ref 9.6–13.1)

## 2015-10-19 MED ORDER — SERTRALINE HCL 25 MG PO TABS: ORAL_TABLET | ORAL | Status: DC

## 2015-10-19 NOTE — Progress Notes (Signed)
Subjective:    Patient ID: [name] [name], female    DOB: 06-23-1940, 75 y.o.   MRN: [id number]  HPI [name] [name] is a 75 year old female with NASH cirrhosis, right-sided colon cancer status post ileocecectomy, history of breast cancer, history of bladder cancer, history of diverticulitis status post partial colectomy, history of GERD with Barrett's esophagus, gastric polyps who is here for follow-up. She was last seen in February 2017. At the time of her last visit she was having issues with nausea and dry heaving. This led to an upper endoscopy. She also was having a liver lesion followed by ultrasound and then MRI. She was also having some rectal bleeding with loose stools.  Since her last visit she had an upper endoscopy and colonoscopy which were both performed on 07/15/2015. No esophageal varices were seen. A 15 mm pedunculated polyp was removed by hot snare from the greater curvature of the stomach. This was hyperplastic without dysplasia. There was mild erythema in the entire stomach which was biopsied. The duodenum was normal. Gastric biopsies showed benign mucosa with mild chronic gastritis. There was no H. pylori. The colonoscopy showed internal hemorrhoids and surgical anastomosis at 60 cm which appeared healthy. There was mild diverticulosis throughout the colon. Random biopsies were benign and negative for microscopic colitis.  She also underwent MRI of the liver with 3 lesions concerning for multifocal hepatoma. This led to a liver biopsy which was reviewed at [facility]. These were felt to be regenerative nodules with no evidence of malignancy. Biopsies did confirm cirrhosis. 6 month follow-up MRI was recommended.  She did receive assistance and has been on rifaximin 550 mg once daily. Her husband states he read the bottle wrong and has only been getting it once daily. He feels like with the rifaximin she has had more clear thought process though she does remain sleepy if she is quiet and  sitting still. He feels like there was improvement when this medication was started.  Her nausea and dry heaves have improved dramatically. She does report considerable loss of appetite and the feeling of early fullness. She's lost about 13 pounds since her last visit here. She's been on Carafate slurry 3-4 times daily. She stopped this for about a week and felt like appetite improved though she went back on it because we had prescribed it. She has been drinking boost for the last several days.  Her husband feels like she has lost joy in activities and may be depressed. She denies feeling specifically depressed. She was prescribed Paxil but it didn't help much so she stopped it after a year.  Bowel movements have been fairly regular for her though she still uses Lomotil for chronic loose stools. She denies further rectal bleeding or melena.    .Review of Systems As per history of present illness, otherwise negative  Current Medications, Allergies, Past Medical History, Past Surgical History, Family History and Social History were reviewed in [employer] record.     Objective:   Physical Exam BP 120/66 mmHg  Pulse 75  Ht 5\' 5"  (1.651 m)  Wt 162 lb (73.483 kg)  BMI 26.96 kg/m2  SpO2 97% Constitutional: Well-developed and well-nourished. No distress. HEENT: Normocephalic and atraumatic. Oropharynx is clear and moist. No oropharyngeal exudate. Conjunctivae are normal.  No scleral icterus. Neck: Neck supple. Trachea midline. Cardiovascular: Normal rate, regular rhythm and intact distal pulses. No M/R/G Pulmonary/chest: Effort normal and breath sounds normal. No wheezing, rales or rhonchi. Abdominal: Soft, nontender, nondistended.  Bowel sounds active throughout. There are no masses palpable.  Extremities: no clubbing, cyanosis, or edema Lymphadenopathy: No cervical adenopathy noted. Neurological: Alert and oriented to person place and time.No asterixis Skin: Skin is  warm and dry. No rashes noted. Psychiatric: Normal mood and affect. Behavior is normal.  MRI ABDOMEN WITHOUT AND WITH CONTRAST   TECHNIQUE: Multiplanar multisequence MR imaging of the abdomen was performed both before and after the administration of intravenous contrast.   CONTRAST:  38mL MULTIHANCE GADOBENATE DIMEGLUMINE 529 MG/ML IV SOLN   COMPARISON:  Ultrasound exam 03/23/2015. Abdominal MRI done for renal abnormality on 04/13/2011.   FINDINGS: Lower chest:  Unremarkable.   Hepatobiliary: Liver has a diffusely nodular contour and heterogeneous signal intensity, consistent with the reported clinical history of cirrhosis. On today's exam, there are at least 3 discrete hypervascular lesions within the liver. The largest of these is in the dome of the liver (segment VIII) and measures 1.4 cm (image 15 series 1101). A second 8 mm hypervascular lesion is seen in the posterior right liver (segment V) on image 45 of series 1101. A third at 11 mm hypervascular lesion is seen in the medial segment left liver, adjacent to the gallbladder fossa (image 50 series 1101). No other hypervascular lesions are identified. Although the dome of the liver was not included on the previous MRI, the other lesions (in segments V and IV) are new in the interval. On the precontrast T1 weighted imaging, there are a few scattered tiny lesions demonstrating increased T1 signal intensity, compatible with dysplastic nodules.   Pancreas: 10 x 15 x 14 mm cystic lesion in the head of the pancreas communicates with the main pancreatic duct. Comparing back to the older study, in retrospect a tiny 5 mm cystic lesion was seen at this location. 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   Spleen: No splenomegaly. No focal mass lesion. Spleen measures 13.9 cm in craniocaudal length.   Adrenals/Urinary Tract: No adrenal nodule or mass. Kidneys are unremarkable.   Stomach/Bowel: Stomach is  nondistended. No gastric wall thickening. No evidence of outlet obstruction. Duodenum is normally positioned as is the ligament of Treitz. No small bowel or colonic dilatation within the imaged portions of the abdomen and upper pelvis.   Vascular/Lymphatic: No abdominal aortic aneurysm. The portal vein is slightly prominent and a degree of portal hypertension not excluded. Portal vein is patent as is the superior mesenteric vein and the splenic vein. No evidence for lymphadenopathy in the hepatoduodenal ligament. No other abdominal lymphadenopathy is visualized.   Other: No intraperitoneal free fluid.   Musculoskeletal: No abnormal marrow signal within the visualized bony anatomy.   IMPRESSION: 1. Three discrete small lesions are identified within this background of cirrhotic liver. Imaging features are compatible with multi focal hepatoma. 2. 15 mm cystic lesion in the head of the pancreas communicates with the main pancreatic duct. 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 3. Upper normal spleen size with prominent, but patent portal vein. Portal venous hypertension is a consideration.     Electronically Signed   By: Misty Stanley M.D.   On: 07/05/2015 17:02   CBC    Component Value Date/Time   WBC 4.7 10/19/2015 1440   WBC 6.2 09/07/2014 1337   WBC 7.8 01/24/2007 1456   RBC 3.91 10/19/2015 1440   RBC 3.59* 09/07/2014 1337   RBC 4.17 01/24/2007 1456   HGB 11.9* 10/19/2015 1440   HGB 11.2* 09/07/2014 1337   HGB 12.6  01/24/2007 1456   HCT 36.0 10/19/2015 1440   HCT 33.9* 09/07/2014 1337   HCT 37.7 01/24/2007 1456   PLT 164.0 10/19/2015 1440   PLT 163 09/07/2014 1337   PLT 256 01/24/2007 1456   MCV 91.9 10/19/2015 1440   MCV 94.4 09/07/2014 1337   MCV 90 01/24/2007 1456   MCH 29.8 07/19/2015 1145   MCH 31.2 09/07/2014 1337   MCH 30.3 01/24/2007 1456   MCHC 33.2 10/19/2015 1440   MCHC 33.0 09/07/2014 1337   MCHC 33.5 01/24/2007 1456   RDW 16.2*  10/19/2015 1440   RDW 16.2* 09/07/2014 1337   RDW 11.6 01/24/2007 1456   LYMPHSABS 1.7 10/19/2015 1440   LYMPHSABS 1.9 09/07/2014 1337   LYMPHSABS 2.9 01/24/2007 1456   MONOABS 0.6 10/19/2015 1440   MONOABS 0.8 09/07/2014 1337   EOSABS 0.1 10/19/2015 1440   EOSABS 0.1 09/07/2014 1337   EOSABS 0.2 01/24/2007 1456   BASOSABS 0.0 10/19/2015 1440   BASOSABS 0.0 09/07/2014 1337   BASOSABS 0.1 01/24/2007 1456    CMP     Component Value Date/Time   NA 140 10/19/2015 1440   NA 141 09/07/2014 1337   NA 138 01/18/2006 1426   K 4.2 10/19/2015 1440   K 3.8 09/07/2014 1337   K 4.2 01/18/2006 1426   CL 101 10/19/2015 1440   CL 99 01/18/2006 1426   CO2 27 10/19/2015 1440   CO2 20* 09/07/2014 1337   CO2 29 01/18/2006 1426   GLUCOSE 135* 10/19/2015 1440   GLUCOSE 116 09/07/2014 1337   GLUCOSE 130* 01/18/2006 1426   BUN 17 10/19/2015 1440   BUN 14.2 09/07/2014 1337   BUN 19 01/18/2006 1426   CREATININE 0.97 10/19/2015 1440   CREATININE 0.8 09/07/2014 1337   CREATININE 0.9 01/18/2006 1426   CALCIUM 10.4 10/19/2015 1440   CALCIUM 9.6 09/07/2014 1337   CALCIUM 10.3 01/18/2006 1426   PROT 8.1 10/19/2015 1440   PROT 7.1 09/07/2014 1337   ALBUMIN 4.1 10/19/2015 1440   ALBUMIN 3.2* 09/07/2014 1337   AST 32 10/19/2015 1440   AST 37* 09/07/2014 1337   ALT 21 10/19/2015 1440   ALT 25 09/07/2014 1337   ALKPHOS 112 10/19/2015 1440   ALKPHOS 113 09/07/2014 1337   BILITOT 1.1 10/19/2015 1440   BILITOT 0.93 09/07/2014 1337   GFRNONAA 85* 07/10/2013 0428   GFRAA >90 07/10/2013 0428    Lab Results  Component Value Date   INR 1.3* 10/19/2015   INR 1.39 07/19/2015   INR 1.4* 07/02/2015   PROTIME 20.0* 10/26/2005   PROTIME 19.0 10/17/2005   PROTIME 19.5* 10/10/2005       Assessment & Plan:  75 year old female with NASH cirrhosis, right-sided colon cancer status post ileocecectomy, history of breast cancer, history of bladder cancer, history of diverticulitis status post partial  colectomy, history of GERD with Barrett's esophagus, gastric polyps who is here for follow-up.  1. NASH cirrhosis -- liver enzymes have remained normal and unfortunately she has not had considerable evidence of portal hypertension. She may have low-grade encephalopathy which has improved with rifaximin. We will continue rifaximin 550 twice a day. --Continue low-sodium diet --Continue rifaximin 550 twice a day --No varices at recent endoscopy --See #2 regarding [place] screening/surveillance  2. Liver lesions -- regenerative nodule on pathology review at [facility]. She remains high risk for hepatocellular carcinoma. An repeat MRI liver protocol in September to follow-up these nodules. Would plan biopsy again if there is definite progression in size.  3. 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 We discussed that this is a precancerous lesion and needs to be followed. Possible though unlikely to be the cause of her anorexia. Attention at MRI in September.  4. History of Barrett's esophagus -- no Barrett's at recent EGD. She continues PPI for GERD. GERD has been well controlled  5. Nausea with dry heaves -- improved after removal of gastric polyp. She can use Zofran on an as-needed basis.  6. Anorexia -- likely multifactorial. Possibilities and contribute factors include cirrhosis, dementia, depression and possibly pancreatic cyst. She seemed to have increased appetite when Carafate was stopped. I'm stopping Carafate altogether now. Hopefully appetite will improve. I've encouraged her to use boost or ensure as a meal replacement if she doesn't feel like eating.  7. Depressed mood -- history of depression. No real response to Paxil. Trial of Zoloft 25 mg if tolerated after 10 days increase to 50 mg daily. We discussed the possibility of worsening depression and anxiety and if this happens they are to stop the medication, notify me immediately. They both voice  understanding.  45 minutes spent with the patient today. Greater than 50% was spent in counseling and coordination of care with the patient

## 2015-10-19 NOTE — Patient Instructions (Signed)
Please discontinue carafate (sucralfate).  Start taking your Xifaxan twice daily.  We have sent the following medications to your pharmacy for you to pick up at your convenience: Zoloft- Take 25 mg (1 tablet) by mouth every night. You may increase to 50 mg (2 tablets) every night if you tolerate the lower dose well after 7-10 days.  You will be due for a follow up MRI -liver/pancreas protocol around September 2018 for follow up of liver nodules and pancreatic cysts. We will contact you when it gets closer to that time.  Please follow up with Dr Hilarie Fredrickson in 6 months.  Your physician has requested that you go to the basement for the following lab work before leaving today: CBC, CMP, INR  Should you need nutritional supplementation when you do not have appetite, please purchase some Boost or Ensure and drink.  If you are age 29 or older, your body mass index should be between 23-30. Your Body mass index is 26.96 kg/(m^2). If this is out of the aforementioned range listed, please consider follow up with your Primary Care Provider.  If you are age 75 or younger, your body mass index should be between 19-25. Your Body mass index is 26.96 kg/(m^2). If this is out of the aformentioned range listed, please consider follow up with your Primary Care Provider.

## 2015-10-20 ENCOUNTER — Telehealth: Payer: Self-pay | Admitting: Internal Medicine

## 2015-10-20 NOTE — Telephone Encounter (Signed)
I have advised Mr.Hosein that I have found the low subsidy application online at [email or web address] I have filled out a portion of the application, however, some of the questions are very personal regarding financial status and I think it would be more appropriate for him to answer those questions himself. He will contact me when he has acess to a computer and I will walk him through how to get to the existing application. He verbalizes understanding. The application reentry number is [id number].

## 2015-11-28 ENCOUNTER — Other Ambulatory Visit: Payer: Self-pay | Admitting: Internal Medicine

## 2015-12-07 ENCOUNTER — Telehealth: Payer: Self-pay | Admitting: Family Medicine

## 2015-12-07 DIAGNOSIS — R413 Other amnesia: Secondary | ICD-10-CM

## 2015-12-07 NOTE — Telephone Encounter (Signed)
Husband states this has been an ongoing issue she has been seeing the dr for at least 2 yrs..  He states she is not better, and a referral has been discussed in the past, but had not been done.   Husband says if pt needs to be seen, then ok, but he thought she has been seeing the dr for this.

## 2015-12-07 NOTE — Telephone Encounter (Signed)
Per Dr. Fry pt will need a office visit to evaluate.  

## 2015-12-07 NOTE — Telephone Encounter (Signed)
lmom to call the office.

## 2015-12-07 NOTE — Telephone Encounter (Signed)
Pts husband would like to have a referral for pt to go to neurologist.  Pt son passed on 08-26-22 Dec 18, 2015 and can not accept that he has passed away.  Pt was asked what color the neighbors car was and she was not able to tell them among other things.

## 2015-12-08 NOTE — Telephone Encounter (Signed)
I did the referral to Neurology  

## 2015-12-08 NOTE — Telephone Encounter (Signed)
I spoke with pt  

## 2015-12-10 NOTE — Telephone Encounter (Signed)
Pts husband calling to check the status of the Neurology referral

## 2015-12-19 ENCOUNTER — Other Ambulatory Visit: Payer: Self-pay | Admitting: Internal Medicine

## 2015-12-22 ENCOUNTER — Encounter: Payer: Self-pay | Admitting: Neurology

## 2015-12-22 ENCOUNTER — Ambulatory Visit (INDEPENDENT_AMBULATORY_CARE_PROVIDER_SITE_OTHER): Payer: Medicare Other | Admitting: Neurology

## 2015-12-22 ENCOUNTER — Other Ambulatory Visit (INDEPENDENT_AMBULATORY_CARE_PROVIDER_SITE_OTHER): Payer: Medicare Other

## 2015-12-22 VITALS — BP 118/58 | HR 79 | Ht 65.0 in | Wt 166.0 lb

## 2015-12-22 DIAGNOSIS — G3184 Mild cognitive impairment, so stated: Secondary | ICD-10-CM | POA: Diagnosis not present

## 2015-12-22 DIAGNOSIS — I208 Other forms of angina pectoris: Secondary | ICD-10-CM

## 2015-12-22 DIAGNOSIS — Z859 Personal history of malignant neoplasm, unspecified: Secondary | ICD-10-CM

## 2015-12-22 DIAGNOSIS — R413 Other amnesia: Secondary | ICD-10-CM | POA: Diagnosis not present

## 2015-12-22 LAB — CREATININE, SERUM: CREATININE: 0.93 mg/dL (ref 0.40–1.20)

## 2015-12-22 LAB — TSH: TSH: 2.5 u[IU]/mL (ref 0.35–4.50)

## 2015-12-22 LAB — VITAMIN B12: Vitamin B-12: 247 pg/mL (ref 211–911)

## 2015-12-22 LAB — BUN: BUN: 13 mg/dL (ref 6–23)

## 2015-12-22 NOTE — Progress Notes (Signed)
NEUROLOGY CONSULTATION NOTE  [name] [name] MRN: [id number] DOB: 02/07/1941  Referring provider: Dr. Alysia Penna Primary care provider: Dr. Alysia Penna  Reason for consult:  Memory loss  Dear Dr Sarajane Jews:  Thank you for your kind referral of [name] [name] for consultation of the above symptoms. Although her history is well known to you, please allow me to reiterate it for the purpose of our medical record. The patient was accompanied to the clinic by her husband who also provides collateral information. Records and images were personally reviewed where available.  HISTORY OF PRESENT ILLNESS: This is a pleasant 75 year old right-handed woman with a history of breast cancer s/p mastectomy, bladder cancer, colon cancer s/p colectomy, NAFLD cirrhosis, presenting for evaluation of memory loss and personality changes. Her husband started noticing changes around 3-4 years ago after she got lost on a road she drives on constantly. She missed several bill payments around that time, which is highly unusual for her. He took over her medications 2-3 years ago and arranges them in a pillbox. He reports she has trouble learning new things. She got a watch for Mother's Day and has just now finally learned how to wind it up. She cannot operate the TV remote control. She feels her memory "could be better." She has noticed word-finding difficulties. Her husband also reports personality changes, in particular with her son passing away 2-1/2 weeks ago, he reports she has not cried yet. She has been doing things out of character, such as showing her middle finger while scratching her nose, which had offended her children. She kept doing this even when they asked her not to. She reports this was a joke and that nothing was wrong with it. He denies any paranoia. She was started on Aricept in December 2016, then [place] was added in February 2017. No side effects on the medications.  She tripped in a parking lot a couple  of months ago and hit the back of her head, no loss of consciousness. No other history of head injuries. She denies any alcohol intake. No family history of dementia. She has occasional positional vertigo. Her husband reports she sleeps all day, waking up only for meals. She has lost interest in reading, which she used to do a lot. She reports good sleep at night with her CPAP. She denies any headaches, diplopia, dysarthria, dysphagia, neck/back pain, focal numbness/tingling/weakness. She has chronic diarrhea since the colectomy.   PAST MEDICAL HISTORY: Past Medical History:  Diagnosis Date  . Adenocarcinoma, breast [facility])    sees Dr. Marcy Panning   . Allergy    environmental  . Arthritis    sees Dr. Hurley Cisco   . Barrett esophagus 12/20/11  . Bladder cancer ([place])   . Cancer, hepatocellular ([place])   . Diabetes mellitus    sees Dr. Chalmers Cater   . Diverticulosis of colon (without mention of hemorrhage) 09/26/2004,09/10/2006  . Gastritis   . GERD (gastroesophageal reflux disease)   . Hemorrhoids   . Hepatic cirrhosis ([place])   . Hepatic steatosis   . Hiatal hernia   . Hyperglycemia   . Hyperlipidemia   . Hypertension   . IBS (irritable bowel syndrome)   . Inflammatory polyps of colon ([place])   . Iron deficiency anemia   . Malignant neoplasm of colon, unspecified site 07/08/2003  . Sleep apnea    sees Dr. Gwenette Greet   . Transaminase or LDH elevation    sees Dr. Zenovia Jarred  PAST SURGICAL HISTORY: Past Surgical History:  Procedure Laterality Date  . bladder cancer 2001    . BREAST SURGERY  2001   lymph nodes removed and rt mastectomy  . CARPAL TUNNEL RELEASE    . [facility] Home   for diverticulitis  . COLONOSCOPY    . ESOPHAGEAL DILATION    . incisonal hernia    . mastectomy and biopsy    . sigmoid colectomy 2005     for cancer  . squamous cell CA  [place]  2008      MEDICATIONS: Current Outpatient Prescriptions on File Prior to Visit  Medication Sig Dispense Refill    . amoxicillin-clavulanate (AUGMENTIN) 875-125 MG tablet Take 1 tablet by mouth 2 (two) times daily. 20 tablet 0  . aspirin EC 81 MG tablet Take 81 mg by mouth at bedtime.    . Calcium Carbonate-Vitamin D (CALCIUM + D PO) Take 1 tablet by mouth daily.    . diphenoxylate-atropine (LOMOTIL) 2.5-0.025 MG tablet TAKE ONE TABLET BY MOUTH THREE TIMES DAILY AS NEEDED FOR DIARRHEA OR LOOSE STOOLS 270 tablet 0  . donepezil (ARICEPT) 10 MG tablet Take 1 tablet (10 mg total) by mouth at bedtime. 90 tablet 3  . Fe Fum-FePoly-Vit C-Vit B3 (INTEGRA) 62.5-62.5-40-3 MG CAPS TAKE 1 CAPSULE BY MOUTH DAILY. 30 capsule 0  . fish oil-omega-3 fatty acids 1000 MG capsule Take 1 g by mouth daily.     . furosemide (LASIX) 40 MG tablet Take 1 tablet (40 mg total) by mouth daily. 90 tablet 3  . glucose blood test strip 1 each by Other route as needed. Use as instructed     . Insulin Isophane Human (NOVOLIN N RELION [place]) Inject 60-65 Units into the skin 2 (two) times daily.     . insulin regular (NOVOLIN R,HUMULIN R) 100 units/mL injection Inject 15 Units into the skin 3 (three) times daily before meals.    . meclizine (ANTIVERT) 25 MG tablet Take 1 tablet (25 mg total) by mouth every 4 (four) hours as needed for dizziness. 60 tablet 5  . memantine (NAMENDA) 10 MG tablet Take 1 tablet (10 mg total) by mouth 2 (two) times daily. 180 tablet 3  . metFORMIN (GLUCOPHAGE-XR) 500 MG 24 hr tablet Take 1,000 mg by mouth 2 (two) times daily.    . metoprolol tartrate (LOPRESSOR) 25 MG tablet Take 1 tablet (25 mg total) by mouth 2 (two) times daily. 180 tablet 2  . nitroGLYCERIN (NITRODUR - DOSED IN MG/24 HR) 0.2 mg/hr patch Reported on 08/23/2015    . omeprazole (PRILOSEC) 20 MG capsule Take 1 capsule (20 mg total) by mouth 2 (two) times daily before a meal. 180 capsule 1  . ondansetron (ZOFRAN-ODT) 4 MG disintegrating tablet DISSOLVE ONE TABLET IN MOUTH EVERY 8 HOURS AS NEEDED FOR NAUSEA OR VOMITING 20 tablet 4  . potassium chloride SA  (K-DUR,KLOR-CON) 20 MEQ tablet Take 1 tablet (20 mEq total) by mouth daily. 90 tablet 3  . RELION INSULIN SYRINGE 1ML/31G 31G X 5/16" 1 ML MISC     . rifaximin (XIFAXAN) 550 MG TABS tablet Take 1 tablet (550 mg total) by mouth 2 (two) times daily. 60 tablet 2  . rosuvastatin (CRESTOR) 10 MG tablet TAKE 1/2 TABLET EVERY DAY 45 tablet 3  . sertraline (ZOLOFT) 25 MG tablet Take 1 tablet by mouth once every night x 7-10 days. Then, increase to 2 tablets by mouth every night thereafter 180 tablet 0  . traMADol (  ULTRAM) 50 MG tablet Take 1 tablet (50 mg total) by mouth every 6 (six) hours as needed. 120 tablet 5   No current facility-administered medications on file prior to visit.     ALLERGIES: No Known Allergies  FAMILY HISTORY: Family History  Problem Relation Age of Onset  . Diabetes Mother   . Skin cancer Mother   . Lung cancer Father   . Lymphoma Sister   . Hypertension Sister   . Diabetes Sister   . Hypertension Sister   . Breast cancer Sister   . Diabetes Sister   . Hypertension Sister   . Inflammatory bowel disease Daughter   . Esophageal cancer Son   . Stroke Neg Hx   . Colon cancer Neg Hx     SOCIAL HISTORY: Social History   Social History  . Marital status: Married    Spouse name: Carmelia Roller  . Number of children: 2  . Years of education: N/A   Occupational History  . 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 Smoking status: Former Smoker    Packs/day: 2.00    Years: 20.00    Types: Cigarettes    Quit date: 05/09/1975  . Smokeless tobacco: Never Used     Comment: smoked 1958-1977, up to 2 ppd  . Alcohol use 0.0 oz/week     Comment: rare  . Drug use:     Types: Methylphenidate  . Sexual activity: Not on file   Other Topics Concern  . 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    [place]: Constitutional: No fevers,  chills, or sweats, no generalized fatigue, change in appetite Eyes: No visual changes, double vision, eye pain Ear, nose and throat: No hearing loss, ear pain, nasal congestion, sore throat Cardiovascular: No chest pain, palpitations Respiratory:  No shortness of breath at rest or with exertion, wheezes GastrointestinaI: No nausea, vomiting, diarrhea, abdominal pain, fecal incontinence Genitourinary:  No dysuria, urinary retention or frequency Musculoskeletal:  No neck pain, back pain Integumentary: No rash, pruritus, skin lesions Neurological: as above Psychiatric: No depression, insomnia, anxiety Endocrine: No palpitations, fatigue, diaphoresis, mood swings, change in appetite, change in weight, increased thirst Hematologic/Lymphatic:  No anemia, purpura, petechiae. Allergic/Immunologic: no itchy/runny eyes, nasal congestion, recent allergic reactions, rashes  PHYSICAL EXAM: Vitals:   12/22/15 0900  BP: (!) 118/58  Pulse: 79   General: No acute distress Head:  Normocephalic/atraumatic Eyes: Fundoscopic exam shows bilateral sharp discs, no vessel changes, exudates, or hemorrhages Neck: supple, no paraspinal tenderness, full range of motion Back: No paraspinal tenderness Heart: regular rate and rhythm Lungs: Clear to auscultation bilaterally. Vascular: No carotid bruits. Skin/Extremities: No rash, no edema, no asterixis Neurological Exam: Mental status: alert and oriented to person, place, and time, no dysarthria or aphasia, Fund of knowledge is appropriate.  Remote memory intact.  Attention and concentration are normal.    Able to name objects and repeat phrases.  Montreal Cognitive Assessment  12/22/2015  Visuospatial/ Executive (0/5) 4  Naming (0/3) 2  Attention: Read list of digits (0/2) 2  Attention: Read list of letters (0/1) 1  Attention: Serial 7 subtraction starting at 100 (0/3) 2  Language: Repeat phrase (0/2) 2  Language : Fluency (0/1) 0  Abstraction (0/2) 2    Delayed Recall (0/5) 0  Orientation (0/6)  6  Total 21  Adjusted Score (based on education) 21   Cranial nerves: CN I: not tested CN II: pupils equal, [name] and reactive to light, visual fields intact, fundi unremarkable. CN III, IV, VI:  full range of motion, no nystagmus, no ptosis CN V: facial sensation intact CN VII: upper and lower face symmetric CN VIII: hearing intact to finger rub CN IX, X: gag intact, uvula midline CN XI: sternocleidomastoid and trapezius muscles intact CN XII: tongue midline Bulk & Tone: normal, no fasciculations. Motor: 5/5 throughout with no pronator drift. Sensation: decreased vibration to left ankle, otherwise intact to light touch, cold, pin,and joint position sense.  No extinction to double simultaneous stimulation.  Romberg test negative Deep Tendon Reflexes: brisk +2 throughout, no ankle clonus Plantar responses: downgoing bilaterally Cerebellar: no incoordination on finger to nose testing Gait: narrow-based and steady, able to tandem walk adequately. Tremor: none  IMPRESSION: This is a pleasant 75 year old right-handed woman with a history of  breast cancer s/p mastectomy, bladder cancer, colon cancer s/p colectomy, NAFLD cirrhosis, presenting for evaluation of memory loss and personality changes. Her neurological exam is largely non-focal, MOCA score 21/30, indicating mild cognitive impairment, however history concerning for mild dementia. She is already taking Aricept and Namenda, continue current medications. We discussed different causes of memory changes, MRI brain with and without contrast will be ordered to assess for underlying structural abnormality, particularly with her history of multiple cancers. Check TSH and B12, as well as ammonia with previous concern for hepatic encephalopathy (no asterixis noted today). Her husband reports unusual behavior with her son's passing, she has not cried since, which is atypical. There may be underlying  depression, which can also cause cognitive changes. She has been started on Zoloft and will increase dose to 50mg  qhs as previously instructed. If no change, they are agreeable to considering seeing a therapist on her next visit. We discussed the importance of control of vascular risk factors, as well as physical exercise and brain stimulation exercises for brain health. She does not drive. She will follow-up in 3 months.   Thank you for allowing me to participate in the care of this patient. Please do not hesitate to call for any questions or concerns.   Ellouise Newer, M.D.  CC: Dr. Sarajane Jews

## 2015-12-22 NOTE — Patient Instructions (Signed)
1. MRI brain with and without contrast 2. Bloodwork for BUN, creatinine, B12, TSH, ammonia 3. Continue all your medications, increase Zoloft to 2 tablets at night 4. Physical exercise and brain stimulation exercises are important for brain health 5. Follow up in 3 months, call for any changes

## 2015-12-23 ENCOUNTER — Telehealth: Payer: Self-pay | Admitting: Neurology

## 2015-12-23 NOTE — Telephone Encounter (Signed)
Notified pt that the order was placed yesterday, sometimes takes a few days, but gave patient the number to [facility] so she could call to make the appt.

## 2015-12-23 NOTE — Telephone Encounter (Signed)
Patient husband called and wants to know the status of the MRi being sch please call Richard at [phone]

## 2015-12-24 ENCOUNTER — Other Ambulatory Visit: Payer: Self-pay | Admitting: Internal Medicine

## 2015-12-29 ENCOUNTER — Ambulatory Visit
Admission: RE | Admit: 2015-12-29 | Discharge: 2015-12-29 | Disposition: A | Discharge status: Home / Self Care | Payer: Medicare Other | Source: Ambulatory Visit | Attending: Neurology | Admitting: Neurology

## 2015-12-29 DIAGNOSIS — G3184 Mild cognitive impairment, so stated: Secondary | ICD-10-CM

## 2015-12-29 DIAGNOSIS — R410 Disorientation, unspecified: Secondary | ICD-10-CM | POA: Diagnosis not present

## 2015-12-29 MED ORDER — GADOBENATE DIMEGLUMINE 529 MG/ML IV SOLN
15.0000 mL | Freq: Once | INTRAVENOUS | Status: AC | PRN
  Administered 2015-12-29: 15 mL via INTRAVENOUS

## 2016-01-21 ENCOUNTER — Other Ambulatory Visit: Payer: Self-pay

## 2016-01-21 ENCOUNTER — Telehealth: Payer: Self-pay

## 2016-01-21 DIAGNOSIS — K769 Liver disease, unspecified: Secondary | ICD-10-CM

## 2016-01-21 NOTE — Telephone Encounter (Signed)
-----   Message from Algernon Huxley, RN sent at 08/10/2015  2:06 PM EDT ----- Regarding: MRI-Liver protocol Pt needs MRI in September

## 2016-01-21 NOTE — Telephone Encounter (Signed)
Pt scheduled for MRI of abdomen at [facility] 02/03/16@9am , pt to arrive there at 8:45am. Pt to be NPO after midnight. Left message for pt to call back.

## 2016-01-24 NOTE — Telephone Encounter (Signed)
Pt is at the [facility] and will not be back home until early November. Will call pt back to scheduled MRI in November.

## 2016-02-03 ENCOUNTER — Ambulatory Visit (HOSPITAL_COMMUNITY): Payer: Medicare Other

## 2016-02-24 ENCOUNTER — Other Ambulatory Visit: Payer: Self-pay | Admitting: Internal Medicine

## 2016-03-14 ENCOUNTER — Ambulatory Visit: Payer: Medicare Other | Admitting: Oncology

## 2016-03-14 DIAGNOSIS — H25012 Cortical age-related cataract, left eye: Secondary | ICD-10-CM | POA: Diagnosis not present

## 2016-03-14 DIAGNOSIS — H25042 Posterior subcapsular polar age-related cataract, left eye: Secondary | ICD-10-CM | POA: Diagnosis not present

## 2016-03-14 DIAGNOSIS — Z961 Presence of intraocular lens: Secondary | ICD-10-CM | POA: Diagnosis not present

## 2016-03-14 DIAGNOSIS — H2512 Age-related nuclear cataract, left eye: Secondary | ICD-10-CM | POA: Diagnosis not present

## 2016-03-20 DIAGNOSIS — E7800 Pure hypercholesterolemia, unspecified: Secondary | ICD-10-CM | POA: Diagnosis not present

## 2016-03-20 DIAGNOSIS — Z23 Encounter for immunization: Secondary | ICD-10-CM | POA: Diagnosis not present

## 2016-03-20 DIAGNOSIS — I10 Essential (primary) hypertension: Secondary | ICD-10-CM | POA: Diagnosis not present

## 2016-03-20 DIAGNOSIS — E1165 Type 2 diabetes mellitus with hyperglycemia: Secondary | ICD-10-CM | POA: Diagnosis not present

## 2016-03-22 ENCOUNTER — Ambulatory Visit (INDEPENDENT_AMBULATORY_CARE_PROVIDER_SITE_OTHER): Payer: Medicare Other | Admitting: Family Medicine

## 2016-03-22 ENCOUNTER — Encounter: Payer: Self-pay | Admitting: Family Medicine

## 2016-03-22 VITALS — BP 125/70 | HR 70 | Temp 99.0°F | Ht 65.0 in | Wt 172.0 lb

## 2016-03-22 DIAGNOSIS — E1169 Type 2 diabetes mellitus with other specified complication: Secondary | ICD-10-CM | POA: Diagnosis not present

## 2016-03-22 DIAGNOSIS — E669 Obesity, unspecified: Secondary | ICD-10-CM

## 2016-03-22 DIAGNOSIS — I208 Other forms of angina pectoris: Secondary | ICD-10-CM | POA: Diagnosis not present

## 2016-03-22 DIAGNOSIS — I10 Essential (primary) hypertension: Secondary | ICD-10-CM

## 2016-03-22 DIAGNOSIS — G3184 Mild cognitive impairment, so stated: Secondary | ICD-10-CM

## 2016-03-22 DIAGNOSIS — I5022 Chronic systolic (congestive) heart failure: Secondary | ICD-10-CM | POA: Diagnosis not present

## 2016-03-22 NOTE — Progress Notes (Signed)
   Subjective:    Patient ID: [name] [name], female    DOB: 1940-06-14, 75 y.o.   MRN: [id number]  HPI Here to follow up. She feels well except for some chronic nasal congestion. No cough or SOB. Her memory seems to be holding stable. She had been seeing Dr. Chalmers Cater for her diabetes, but this has been well controlled. Her last A1c 3 days ago was 6.1. In fact she says Dr. Chalmers Cater has turned her back over to use to manage this issue.    Review of Systems  Constitutional: Negative.   HENT: Positive for congestion and postnasal drip. Negative for ear pain, rhinorrhea, sinus pain, sinus pressure and sore throat.   Eyes: Negative.   Respiratory: Negative.   Cardiovascular: Negative.        Objective:   Physical Exam  Constitutional: She is oriented to person, place, and time. She appears well-developed and well-nourished.  HENT:  Right Ear: External ear normal.  Left Ear: External ear normal.  Nose: Nose normal.  Mouth/Throat: Oropharynx is clear and moist.  Eyes: Conjunctivae are normal.  Neck: Neck supple. No thyromegaly present.  Cardiovascular: Normal rate, regular rhythm and intact distal pulses.   2/6 SM over the aortic area (she has aortic sclerosis per ECHO in 2015)   Pulmonary/Chest: Effort normal and breath sounds normal.  Lymphadenopathy:    She has no cervical adenopathy.  Neurological: She is alert and oriented to person, place, and time.          Assessment & Plan:  Her diabetes is stable. Her memory loss is stable. HTN and CHF are stable.  [name], MD

## 2016-03-22 NOTE — Progress Notes (Signed)
Pre visit review using our clinic review tool, if applicable. No additional management support is needed unless otherwise documented below in the visit note. 

## 2016-03-23 ENCOUNTER — Telehealth: Payer: Self-pay

## 2016-03-23 NOTE — Telephone Encounter (Signed)
-----   Message from Algernon Huxley, RN sent at 01/24/2016 12:17 PM EDT ----- Regarding: MRI Needs MRI Of liver in November.

## 2016-03-23 NOTE — Telephone Encounter (Signed)
Pt scheduled for MRI of Liver at [facility] [address] on 04/03/16@9am , pt to arrive there at 8:45am. Pt to be NPO after midnight. Left message for pt to call back.

## 2016-03-24 ENCOUNTER — Telehealth: Payer: Self-pay | Admitting: Cardiovascular Disease

## 2016-03-24 NOTE — Telephone Encounter (Signed)
New message  Ms tammy call requestign to speak with RN. Tammy did state pt is present to speak with RN. Tammy states pt is retaining fluids and has gained 8lbs in three weeks. Tammy also states that pts legs are really swollen. Please call back to discuss

## 2016-03-24 NOTE — Telephone Encounter (Signed)
Spoke with Tammy(daughter) and pt states that her weight is up 8#  In the last 3 weeks. Last weight 169# yesterday her VS at Dr Barbie Banner office 125/70 hr 70 temp 99 wt 172#, she states that she has also been to other MD appts and it has been stable there too. She states that she is not SOB or having any other sx, CP or pressure, nausea, sweating, etc... She states that her husband is in [place] and she has been there all the time, sitting down. I informed her to elevate her LE and take extra lasix over the weekend and call back then with results. Please advise

## 2016-03-27 NOTE — Telephone Encounter (Signed)
She can take additional Lasix and follow her weight. She is not symptomatic she does not need to be seen.

## 2016-03-27 NOTE — Telephone Encounter (Signed)
Spoke with pt and she is aware of appt date and time. 

## 2016-03-28 ENCOUNTER — Ambulatory Visit (INDEPENDENT_AMBULATORY_CARE_PROVIDER_SITE_OTHER): Payer: Medicare Other | Admitting: Neurology

## 2016-03-28 ENCOUNTER — Encounter: Payer: Self-pay | Admitting: Neurology

## 2016-03-28 ENCOUNTER — Other Ambulatory Visit (HOSPITAL_COMMUNITY): Payer: Self-pay | Admitting: Interventional Radiology

## 2016-03-28 VITALS — BP 120/78 | HR 76 | Ht 65.0 in | Wt 170.1 lb

## 2016-03-28 DIAGNOSIS — F0390 Unspecified dementia without behavioral disturbance: Secondary | ICD-10-CM

## 2016-03-28 DIAGNOSIS — I208 Other forms of angina pectoris: Secondary | ICD-10-CM | POA: Diagnosis not present

## 2016-03-28 DIAGNOSIS — F03A0 Unspecified dementia, mild, without behavioral disturbance, psychotic disturbance, mood disturbance, and anxiety: Secondary | ICD-10-CM | POA: Insufficient documentation

## 2016-03-28 DIAGNOSIS — R160 Hepatomegaly, not elsewhere classified: Secondary | ICD-10-CM

## 2016-03-28 NOTE — Progress Notes (Signed)
NEUROLOGY FOLLOW UP OFFICE NOTE  EVADENE CAPPARELLI KF:[id number]  HISTORY OF PRESENT ILLNESS: I had the pleasure of seeing [name] [name] in follow-up in the neurology clinic on 03/28/2016.  The patient was last seen 3 months ago for mild dementia and is accompanied by her daughter who helps supplement the history today.  Records and images were personally reviewed where available.  I personally reviewed MRI brain with and without contrast which did not show any acute changes, there was mild diffuse atrophy and moderate chronic microvascular disease. TSH and B12 were normal. MOCA score in August 2017 was 21/30, indicating mild cognitive impairment, however history suggestive of mild dementia. Her husband has taken over her medications and bill payments. She had already been taking Aricept and Namenda. Her husband was concerned about personality changes, she was doing things out of character. Her Zoloft dose was increased to 50mg  daily. Her daughter has not noticed that it made any difference. She continues to have the same memory changes, and "has no filter." She denies any headaches, dizziness, diplopia, focal numbness/tingling/weakness, no falls. No difficulties with ADLs.  HPI: This is a pleasant 75 yo RH woman with a history of breast cancer s/p mastectomy, bladder cancer, colon cancer s/p colectomy, NAFLD cirrhosis, who presented for memory loss and personality changes. Her husband started noticing changes around 3-4 years ago after she got lost on a road she drives on constantly. She missed several bill payments around that time, which is highly unusual for her. He took over her medications 2-3 years ago and arranges them in a pillbox. He reports she has trouble learning new things. She got a watch for Mother's Day and has just now finally learned how to wind it up. She cannot operate the TV remote control. She feels her memory "could be better." She has noticed word-finding difficulties. Her husband also  reports personality changes, in particular with her son passing away 2-1/2 weeks ago, he reports she has not cried yet. She has been doing things out of character, such as showing her middle finger while scratching her nose, which had offended her children. She kept doing this even when they asked her not to. She reports this was a joke and that nothing was wrong with it. He denies any paranoia. She was started on Aricept in December 2016, then [place] was added in February 2017. No side effects on the medications.  She tripped in a parking lot a couple of months ago and hit the back of her head, no loss of consciousness. No other history of head injuries. She denies any alcohol intake. No family history of dementia. She has occasional positional vertigo. Her husband reports she sleeps all day, waking up only for meals. She has lost interest in reading, which she used to do a lot. She reports good sleep at night with her CPAP. She has chronic diarrhea since the colectomy  PAST MEDICAL HISTORY: Past Medical History:  Diagnosis Date  . Adenocarcinoma, breast [facility])    sees Dr. Marcy Panning   . Allergy    environmental  . Arthritis    sees Dr. Hurley Cisco   . Barrett esophagus 12/20/11  . Bladder cancer ([place])   . Cancer, hepatocellular ([place])   . Diabetes mellitus    sees Dr. Chalmers Cater   . Diverticulosis of colon (without mention of hemorrhage) 09/26/2004,09/10/2006  . Gastritis   . GERD (gastroesophageal reflux disease)   . Hemorrhoids   . Hepatic cirrhosis ([place])   .  Hepatic steatosis   . Hiatal hernia   . Hyperglycemia   . Hyperlipidemia   . Hypertension   . IBS (irritable bowel syndrome)   . Inflammatory polyps of colon ([place])   . Iron deficiency anemia   . Malignant neoplasm of colon, unspecified site 07/08/2003  . Sleep apnea    sees Dr. Gwenette Greet   . Transaminase or LDH elevation    sees Dr. Zenovia Jarred     MEDICATIONS: Current Outpatient Prescriptions on File Prior to Visit    Medication Sig Dispense Refill  . amoxicillin-clavulanate (AUGMENTIN) 875-125 MG tablet Take 1 tablet by mouth 2 (two) times daily. (Patient not taking: Reported on 03/22/2016) 20 tablet 0  . aspirin EC 81 MG tablet Take 81 mg by mouth at bedtime.    . Calcium Carbonate-Vitamin D (CALCIUM + D PO) Take 1 tablet by mouth daily.    . diphenoxylate-atropine (LOMOTIL) 2.5-0.025 MG tablet TAKE ONE TABLET BY MOUTH THREE TIMES DAILY AS NEEDED FOR  DIARRHEA  OR  LOOSE  STOOLS 270 tablet 0  . donepezil (ARICEPT) 10 MG tablet Take 1 tablet (10 mg total) by mouth at bedtime. 90 tablet 3  . Fe Fum-FePoly-Vit C-Vit B3 (INTEGRA) 62.5-62.5-40-3 MG CAPS TAKE 1 CAPSULE BY MOUTH DAILY. 30 capsule 0  . fish oil-omega-3 fatty acids 1000 MG capsule Take 1 g by mouth daily.     . furosemide (LASIX) 40 MG tablet Take 1 tablet (40 mg total) by mouth daily. 90 tablet 3  . glucose blood test strip 1 each by Other route 2 (two) times daily. One Touch    . Insulin Isophane Human (NOVOLIN N RELION [place]) Inject 40 Units into the skin 2 (two) times daily.     . meclizine (ANTIVERT) 25 MG tablet Take 1 tablet (25 mg total) by mouth every 4 (four) hours as needed for dizziness. 60 tablet 5  . memantine (NAMENDA) 10 MG tablet Take 1 tablet (10 mg total) by mouth 2 (two) times daily. 180 tablet 3  . metFORMIN (GLUCOPHAGE-XR) 500 MG 24 hr tablet Take 1,000 mg by mouth 2 (two) times daily.    . metoprolol tartrate (LOPRESSOR) 25 MG tablet Take 1 tablet (25 mg total) by mouth 2 (two) times daily. 180 tablet 2  . nitroGLYCERIN (NITRODUR - DOSED IN MG/24 HR) 0.2 mg/hr patch Reported on 08/23/2015    . omeprazole (PRILOSEC) 20 MG capsule Take 1 capsule (20 mg total) by mouth 2 (two) times daily before a meal. 180 capsule 1  . ondansetron (ZOFRAN-ODT) 4 MG disintegrating tablet DISSOLVE ONE TABLET IN MOUTH EVERY 8 HOURS AS NEEDED FOR NAUSEA OR VOMITING (Patient not taking: Reported on 03/22/2016) 20 tablet 4  . potassium chloride SA  (K-DUR,KLOR-CON) 20 MEQ tablet Take 1 tablet (20 mEq total) by mouth daily. 90 tablet 3  . RELION INSULIN SYRINGE 1ML/31G 31G X 5/16" 1 ML MISC     . rifaximin (XIFAXAN) 550 MG TABS tablet Take 1 tablet (550 mg total) by mouth 2 (two) times daily. 60 tablet 2  . rosuvastatin (CRESTOR) 10 MG tablet TAKE 1/2 TABLET EVERY DAY 45 tablet 3  . sertraline (ZOLOFT) 25 MG tablet TAKE ONE TABLET BY MOUTH EVERY NIGHT FOR 7-10 DAYS THEN INCREASE TO TWO TABLETS EVERY NIGHT THEREAFTER 180 tablet 0  . traMADol (ULTRAM) 50 MG tablet Take 1 tablet (50 mg total) by mouth every 6 (six) hours as needed. 120 tablet 5   No current facility-administered medications on file prior to visit.  ALLERGIES: No Known Allergies  FAMILY HISTORY: Family History  Problem Relation Age of Onset  . Diabetes Mother   . Skin cancer Mother   . Lung cancer Father   . Lymphoma Sister   . Hypertension Sister   . Diabetes Sister   . Hypertension Sister   . Breast cancer Sister   . Diabetes Sister   . Hypertension Sister   . Inflammatory bowel disease Daughter   . Esophageal cancer Son   . Stroke Neg Hx   . Colon cancer Neg Hx     SOCIAL HISTORY: Social History   Social History  . Marital status: Married    Spouse name: Carmelia Roller  . Number of children: 2  . Years of education: N/A   Occupational History  . 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 Smoking status: Former Smoker    Packs/day: 2.00    Years: 20.00    Types: Cigarettes    Quit date: 05/09/1975  . Smokeless tobacco: Never Used     Comment: smoked 1958-1977, up to 2 ppd  . Alcohol use 0.0 oz/week     Comment: rare  . Drug use:     Types: Methylphenidate  . Sexual activity: Not on file   Other Topics Concern  . 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    [place]: Constitutional: No fevers, chills, or  sweats, no generalized fatigue, change in appetite Eyes: No visual changes, double vision, eye pain Ear, nose and throat: No hearing loss, ear pain, nasal congestion, sore throat Cardiovascular: No chest pain, palpitations Respiratory:  No shortness of breath at rest or with exertion, wheezes GastrointestinaI: No nausea, vomiting, diarrhea, abdominal pain, fecal incontinence Genitourinary:  No dysuria, urinary retention or frequency Musculoskeletal:  No neck pain, back pain Integumentary: No rash, pruritus, skin lesions Neurological: as above Psychiatric: No depression, insomnia, anxiety Endocrine: No palpitations, fatigue, diaphoresis, mood swings, change in appetite, change in weight, increased thirst Hematologic/Lymphatic:  No anemia, purpura, petechiae. Allergic/Immunologic: no itchy/runny eyes, nasal congestion, recent allergic reactions, rashes  PHYSICAL EXAM: Vitals:   03/28/16 1357  BP: 120/78  Pulse: 76   General: No acute distress Head:  Normocephalic/atraumatic Skin/Extremities: No rash, no edema Neurological Exam: awake and alert, no aphasia or dysarthria. Fund of knowledge is appropriate. Attention and concentration are normal.   Cranial nerves: Pupils equal, round. No facial asymmetry. Motor: moves all extremities symmetrically.Gait narrow-based and steady.  IMPRESSION: This is a pleasant 75 yo RH woman with a history of  breast cancer s/p mastectomy, bladder cancer, colon cancer s/p colectomy, NAFLD cirrhosis, who presented with memory loss and personality changes. Her neurological exam is largely non-focal, MOCA score in August 2017 was 21/30, indicating mild cognitive impairment, however history concerning for mild dementia. MRI brain no acute changes. TSH and B12 normal. She is already taking Aricept and Namenda, continue current medications. Her husband reported unusual behavior with her son's passing, she has not cried since, which is atypical. There may be underlying  depression, which can also cause cognitive changes. Her family has not noticed much change on higher dose Zoloft 50mg  qhs. We had an extensive discussion about the diagnosis and prognosis, as well as continue to monitor for treatable causes of memory changes, such as depression. She declines to see a counselor  at this time. Her daughter asks about driving, a DMV driving evaluation has been recommended. We again discussed the importance of control of vascular risk factors, as well as physical exercise and brain stimulation exercises for brain health, she may benefit from adult day programs. She will follow-up in 6 months.   Thank you for allowing me to participate in her care.  Please do not hesitate to call for any questions or concerns.  The duration of this appointment visit was 25 minutes of face-to-face time with the patient.  Greater than 50% of this time was spent in counseling, explanation of diagnosis, planning of further management, and coordination of care.   Ellouise Newer, M.D.   CC: Dr. Sarajane Jews

## 2016-03-28 NOTE — Patient Instructions (Addendum)
1. Continue Aricept and Namenda 2. Continue Zoloft 50mg  daily 3. Control of blood pressure, cholesterol, as well as physical exercise and brain stimulation exercises are important for brain health. Look into adult day programs for social groups 4. Continue to monitor mood 5. Recommend DMV driving evaluation 6. Follow-up in 6 months, call for any changes

## 2016-03-28 NOTE — Telephone Encounter (Signed)
Tammy(daughter) notified she will track weight, she confirms that pt is not having any other symptoms no SOB, CP or pressure, nausea, sweating etc.. She will call back if this does not help.

## 2016-04-03 ENCOUNTER — Ambulatory Visit (HOSPITAL_COMMUNITY): Admission: RE | Admit: 2016-04-03 | Payer: Medicare Other | Source: Ambulatory Visit

## 2016-04-05 ENCOUNTER — Other Ambulatory Visit: Payer: Self-pay | Admitting: Internal Medicine

## 2016-04-10 ENCOUNTER — Ambulatory Visit (HOSPITAL_COMMUNITY)
Admission: RE | Admit: 2016-04-10 | Discharge: 2016-04-10 | Disposition: A | Discharge status: Home / Self Care | Payer: Medicare Other | Source: Ambulatory Visit | Attending: Internal Medicine | Admitting: Internal Medicine

## 2016-04-10 DIAGNOSIS — K7460 Unspecified cirrhosis of liver: Secondary | ICD-10-CM | POA: Insufficient documentation

## 2016-04-10 DIAGNOSIS — K769 Liver disease, unspecified: Secondary | ICD-10-CM | POA: Diagnosis not present

## 2016-04-10 DIAGNOSIS — K869 Disease of pancreas, unspecified: Secondary | ICD-10-CM | POA: Insufficient documentation

## 2016-04-10 DIAGNOSIS — I517 Cardiomegaly: Secondary | ICD-10-CM | POA: Insufficient documentation

## 2016-04-10 DIAGNOSIS — K7689 Other specified diseases of liver: Secondary | ICD-10-CM | POA: Diagnosis not present

## 2016-04-10 LAB — POCT I-STAT CREATININE: Creatinine, Ser: 0.8 mg/dL (ref 0.44–1.00)

## 2016-04-10 MED ORDER — GADOBENATE DIMEGLUMINE 529 MG/ML IV SOLN: 20.0000 mL | Freq: Once | INTRAVENOUS | Status: DC | PRN

## 2016-04-11 ENCOUNTER — Emergency Department (HOSPITAL_COMMUNITY)
Admission: EM | Admit: 2016-04-11 | Discharge: 2016-04-11 | Disposition: A | Discharge status: Home / Self Care | Payer: Medicare Other | Attending: Emergency Medicine | Admitting: Emergency Medicine

## 2016-04-11 ENCOUNTER — Encounter (HOSPITAL_COMMUNITY): Payer: Self-pay | Admitting: Vascular Surgery

## 2016-04-11 ENCOUNTER — Telehealth: Payer: Self-pay | Admitting: Family Medicine

## 2016-04-11 ENCOUNTER — Emergency Department (HOSPITAL_COMMUNITY): Payer: Medicare Other

## 2016-04-11 DIAGNOSIS — Z79899 Other long term (current) drug therapy: Secondary | ICD-10-CM | POA: Insufficient documentation

## 2016-04-11 DIAGNOSIS — R29810 Facial weakness: Secondary | ICD-10-CM | POA: Diagnosis not present

## 2016-04-11 DIAGNOSIS — I5033 Acute on chronic diastolic (congestive) heart failure: Secondary | ICD-10-CM | POA: Insufficient documentation

## 2016-04-11 DIAGNOSIS — R2681 Unsteadiness on feet: Secondary | ICD-10-CM | POA: Diagnosis not present

## 2016-04-11 DIAGNOSIS — Z85038 Personal history of other malignant neoplasm of large intestine: Secondary | ICD-10-CM | POA: Insufficient documentation

## 2016-04-11 DIAGNOSIS — Z87891 Personal history of nicotine dependence: Secondary | ICD-10-CM | POA: Diagnosis not present

## 2016-04-11 DIAGNOSIS — E119 Type 2 diabetes mellitus without complications: Secondary | ICD-10-CM | POA: Diagnosis not present

## 2016-04-11 DIAGNOSIS — Z85828 Personal history of other malignant neoplasm of skin: Secondary | ICD-10-CM | POA: Diagnosis not present

## 2016-04-11 DIAGNOSIS — Z7984 Long term (current) use of oral hypoglycemic drugs: Secondary | ICD-10-CM | POA: Diagnosis not present

## 2016-04-11 DIAGNOSIS — Z8505 Personal history of malignant neoplasm of liver: Secondary | ICD-10-CM | POA: Diagnosis not present

## 2016-04-11 DIAGNOSIS — G839 Paralytic syndrome, unspecified: Secondary | ICD-10-CM | POA: Diagnosis not present

## 2016-04-11 DIAGNOSIS — Z8551 Personal history of malignant neoplasm of bladder: Secondary | ICD-10-CM | POA: Diagnosis not present

## 2016-04-11 DIAGNOSIS — I110 Hypertensive heart disease with heart failure: Secondary | ICD-10-CM | POA: Insufficient documentation

## 2016-04-11 DIAGNOSIS — I6789 Other cerebrovascular disease: Secondary | ICD-10-CM | POA: Diagnosis not present

## 2016-04-11 DIAGNOSIS — R531 Weakness: Secondary | ICD-10-CM | POA: Diagnosis present

## 2016-04-11 LAB — I-STAT CHEM 8, ED
BUN: 12 mg/dL (ref 6–20)
Calcium, Ion: 1.18 mmol/L (ref 1.15–1.40)
Chloride: 104 mmol/L (ref 101–111)
Creatinine, Ser: 0.8 mg/dL (ref 0.44–1.00)
Glucose, Bld: 111 mg/dL — ABNORMAL HIGH (ref 65–99)
HEMATOCRIT: 27 % — AB (ref 36.0–46.0)
HEMOGLOBIN: 9.2 g/dL — AB (ref 12.0–15.0)
POTASSIUM: 3.6 mmol/L (ref 3.5–5.1)
SODIUM: 142 mmol/L (ref 135–145)
TCO2: 25 mmol/L (ref 0–100)

## 2016-04-11 LAB — RAPID URINE DRUG SCREEN, HOSP PERFORMED
Amphetamines: NOT DETECTED
BENZODIAZEPINES: NOT DETECTED
Barbiturates: NOT DETECTED
COCAINE: NOT DETECTED
OPIATES: NOT DETECTED
Tetrahydrocannabinol: NOT DETECTED

## 2016-04-11 LAB — DIFFERENTIAL
BASOS ABS: 0 10*3/uL (ref 0.0–0.1)
BASOS PCT: 1 %
EOS ABS: 0.1 10*3/uL (ref 0.0–0.7)
EOS PCT: 2 %
LYMPHS ABS: 1.4 10*3/uL (ref 0.7–4.0)
Lymphocytes Relative: 30 %
MONOS PCT: 12 %
Monocytes Absolute: 0.6 10*3/uL (ref 0.1–1.0)
NEUTROS PCT: 55 %
Neutro Abs: 2.6 10*3/uL (ref 1.7–7.7)

## 2016-04-11 LAB — URINALYSIS, ROUTINE W REFLEX MICROSCOPIC
Bilirubin Urine: NEGATIVE
GLUCOSE, UA: NEGATIVE mg/dL
HGB URINE DIPSTICK: NEGATIVE
Ketones, ur: NEGATIVE mg/dL
LEUKOCYTES UA: NEGATIVE
Nitrite: NEGATIVE
PH: 5 (ref 5.0–8.0)
PROTEIN: NEGATIVE mg/dL
SPECIFIC GRAVITY, URINE: 1.013 (ref 1.005–1.030)

## 2016-04-11 LAB — COMPREHENSIVE METABOLIC PANEL
ALBUMIN: 3.1 g/dL — AB (ref 3.5–5.0)
ALT: 19 U/L (ref 14–54)
ANION GAP: 7 (ref 5–15)
AST: 42 U/L — AB (ref 15–41)
Alkaline Phosphatase: 109 U/L (ref 38–126)
BILIRUBIN TOTAL: 0.9 mg/dL (ref 0.3–1.2)
BUN: 11 mg/dL (ref 6–20)
CHLORIDE: 106 mmol/L (ref 101–111)
CO2: 28 mmol/L (ref 22–32)
Calcium: 9.8 mg/dL (ref 8.9–10.3)
Creatinine, Ser: 0.88 mg/dL (ref 0.44–1.00)
GFR calc Af Amer: >60 mL/min (ref >60)
Glucose, Bld: 112 mg/dL — ABNORMAL HIGH (ref 65–99)
POTASSIUM: 3.6 mmol/L (ref 3.5–5.1)
Sodium: 141 mmol/L (ref 135–145)
TOTAL PROTEIN: 6.8 g/dL (ref 6.5–8.1)

## 2016-04-11 LAB — I-STAT TROPONIN, ED: TROPONIN I, POC: 0 ng/mL (ref 0.00–0.08)

## 2016-04-11 LAB — CBC
HCT: 29.5 % — ABNORMAL LOW (ref 36.0–46.0)
HEMOGLOBIN: 9.4 g/dL — AB (ref 12.0–15.0)
MCH: 28.9 pg (ref 26.0–34.0)
MCHC: 31.9 g/dL (ref 30.0–36.0)
MCV: 90.8 fL (ref 78.0–100.0)
Platelets: 164 10*3/uL (ref 150–400)
RBC: 3.25 MIL/uL — ABNORMAL LOW (ref 3.87–5.11)
RDW: 16.4 % — AB (ref 11.5–15.5)
WBC: 4.7 10*3/uL (ref 4.0–10.5)

## 2016-04-11 LAB — PROTIME-INR
INR: 1.29
Prothrombin Time: 16.2 seconds — ABNORMAL HIGH (ref 11.4–15.2)

## 2016-04-11 LAB — ETHANOL

## 2016-04-11 LAB — APTT: APTT: 39 s — AB (ref 24–36)

## 2016-04-11 LAB — CBG MONITORING, ED: GLUCOSE-CAPILLARY: 205 mg/dL — AB (ref 65–99)

## 2016-04-11 NOTE — Telephone Encounter (Signed)
Pt has checked in to ED for evaluation/treatment. Nothing further needed at this time.

## 2016-04-11 NOTE — ED Notes (Signed)
Pt verbalized understanding discharge instructions and denies any further needs or questions at this time. VS stable, ambulatory and steady gait.   

## 2016-04-11 NOTE — ED Triage Notes (Signed)
Pt reports to the ED via [facility]. Pt had some left sided weakness and left sided facial droop. LSN approx 10:35 and symptoms had resolved by 10:56. Upon EMS arrival symptoms had resolved. CBG 244 mg/dl. VSS en route. 12 lead showed NSR. These symptoms have been going on for the past month and she has not been evaluated for it. Pt denies any numbness, tingling, ataxia, dysphagia, or aphasia at this time. Also denies any HA. Pt A&Ox4, resp e/u, and skin warm and dry.

## 2016-04-11 NOTE — ED Provider Notes (Signed)
[facility] DEPT Provider Note   CSN: [id number] Arrival date & time: 04/11/16  1157     History   Chief Complaint Chief Complaint  Patient presents with  . Transient Ischemic Attack    HPI TAMBRIA SHAM is a 75 y.o. female.  HPI  75 year old female presents with unsteadiness on her feet and leaning to the left for the last month. It is coming and going. Most recently occurred this morning but seems to have resolved. The daughter and patient noticed it only at times when she is standing up and walking. Patient states she feels off balance like she's going to fall over to the left. There is no dizziness or room spinning sensation. She does not feel like her left side is weak during these times. No headache, neck pain, chest pain. She has chronic diarrhea but not worse than typical. Has never had slurred speech or facial droop during these times.  Past Medical History:  Diagnosis Date  . Adenocarcinoma, breast [facility])    sees Dr. Marcy Panning   . Allergy    environmental  . Arthritis    sees Dr. Hurley Cisco   . Barrett esophagus 12/20/11  . Bladder cancer ([place])   . Cancer, hepatocellular ([place])   . Diabetes mellitus    sees Dr. Chalmers Cater   . Diverticulosis of colon (without mention of hemorrhage) 09/26/2004,09/10/2006  . Gastritis   . GERD (gastroesophageal reflux disease)   . Hemorrhoids   . Hepatic cirrhosis ([place])   . Hepatic steatosis   . Hiatal hernia   . Hyperglycemia   . Hyperlipidemia   . Hypertension   . IBS (irritable bowel syndrome)   . Inflammatory polyps of colon ([place])   . Iron deficiency anemia   . Malignant neoplasm of colon, unspecified site 07/08/2003  . Sleep apnea    sees Dr. Gwenette Greet   . Transaminase or LDH elevation    sees Dr. Zenovia Jarred     Patient Active Problem List   Diagnosis Date Noted  . Mild dementia 03/28/2016  . Mild cognitive impairment 12/22/2015  . Memory loss or impairment 06/22/2015  . Nausea with vomiting 02/07/2014  . Fatty  liver 07/28/2013  . Barrett's esophagus 07/28/2013  . Acute on chronic diastolic HF (heart failure) ([place]) 07/10/2013  . Chest pain 07/08/2013  . Dyspnea on exertion 07/07/2013  . Exertional angina ([place]) 07/07/2013  . CHF (congestive heart failure) ([place]) 07/07/2013  . Lumbar radiculopathy 05/06/2013  . Anemia, unspecified 05/06/2013  . OSA (obstructive sleep apnea)- C-pap 02/04/2013  . Arthralgia of multiple joints 12/13/2012  . DIZZINESS 05/25/2010  . POSTHERPETIC NEURALGIA 10/26/2009  . GERD 05/18/2008  . HIATAL HERNIA 05/18/2008  . DIVERTICULITIS, HX OF 05/18/2008  . Essential hypertension 05/11/2008  . COLON CANCER, HX OF 05/11/2008  . SKIN CANCER, HX OF 05/11/2008  . Malignant neoplasm of central portion of right female breast ([place]) 04/25/2007  . BLADDER CANCER , UNSPEC. 04/25/2007  . Diabetes mellitus type 2 in obese ([place]) 04/25/2007  . HYPERLIPIDEMIA 04/25/2007  . DIVERTICULOSIS, COLON 08/13/2006  . IBS 08/13/2006  . ELEVATION, TRANSAMINASE/LDH LEVELS 08/13/2006    Past Surgical History:  Procedure Laterality Date  . bladder cancer 2001    . BREAST SURGERY  2001   lymph nodes removed and rt mastectomy  . CARPAL TUNNEL RELEASE    . [place]   for diverticulitis  . COLONOSCOPY    . ESOPHAGEAL DILATION    . incisonal hernia    .  mastectomy and biopsy    . sigmoid colectomy 2005     for cancer  . squamous cell CA  R  [place]  2008      OB History    No data available       Home Medications    Prior to Admission medications   Medication Sig Start Date End Date Taking? Authorizing Provider  amoxicillin-clavulanate (AUGMENTIN) 875-125 MG tablet Take 1 tablet by mouth 2 (two) times daily. 08/23/15   [name], MD  aspirin EC 81 MG tablet Take 81 mg by mouth at bedtime.    Historical Provider, MD  Calcium Carbonate-Vitamin D (CALCIUM + D PO) Take 1 tablet by mouth daily.    Historical Provider, MD  diphenoxylate-atropine (LOMOTIL) 2.5-0.025 MG tablet  TAKE ONE TABLET BY MOUTH THREE TIMES DAILY AS NEEDED FOR  DIARRHEA  OR  LOOSE  STOOLS 12/24/15   Jerene Bears, MD  donepezil (ARICEPT) 10 MG tablet Take 1 tablet (10 mg total) by mouth at bedtime. 06/23/15   [name], MD  Fe Fum-FePoly-Vit C-Vit B3 (INTEGRA) 62.5-62.5-40-3 MG CAPS TAKE 1 CAPSULE BY MOUTH DAILY. 12/25/14   Jerene Bears, MD  fish oil-omega-3 fatty acids 1000 MG capsule Take 1 g by mouth daily.     Historical Provider, MD  furosemide (LASIX) 40 MG tablet Take 1 tablet (40 mg total) by mouth daily. 07/30/15   Lorretta Harp, MD  glucose blood test strip 1 each by Other route 2 (two) times daily. One Touch    Historical Provider, MD  Insulin Isophane Human (NOVOLIN N RELION [place]) Inject 40 Units into the skin 2 (two) times daily.     Historical Provider, MD  meclizine (ANTIVERT) 25 MG tablet Take 1 tablet (25 mg total) by mouth every 4 (four) hours as needed for dizziness. 05/13/14   [name], MD  memantine (NAMENDA) 10 MG tablet Take 1 tablet (10 mg total) by mouth 2 (two) times daily. 06/23/15   [name], MD  metFORMIN (GLUCOPHAGE-XR) 500 MG 24 hr tablet Take 1,000 mg by mouth 2 (two) times daily. 04/14/13   Historical Provider, MD  metoprolol tartrate (LOPRESSOR) 25 MG tablet Take 1 tablet (25 mg total) by mouth 2 (two) times daily. 08/23/15   Lorretta Harp, MD  nitroGLYCERIN (NITRODUR - DOSED IN MG/24 HR) 0.2 mg/hr patch Reported on 08/23/2015 01/17/14   Historical Provider, MD  omeprazole (PRILOSEC) 20 MG capsule Take 1 capsule (20 mg total) by mouth 2 (two) times daily before a meal. 12/20/15   Jerene Bears, MD  ondansetron (ZOFRAN-ODT) 4 MG disintegrating tablet DISSOLVE ONE TABLET IN MOUTH EVERY 8 HOURS AS NEEDED FOR NAUSEA OR VOMITING 11/29/15   Jerene Bears, MD  potassium chloride SA (K-DUR,KLOR-CON) 20 MEQ tablet Take 1 tablet (20 mEq total) by mouth daily. 07/30/15   Lorretta Harp, MD  RELION INSULIN SYRINGE 1ML/31G 31G X 5/16" 1 ML MISC  12/08/11   Historical Provider, MD   rifaximin (XIFAXAN) 550 MG TABS tablet Take 1 tablet (550 mg total) by mouth 2 (two) times daily. 07/02/15   Jerene Bears, MD  rosuvastatin (CRESTOR) 10 MG tablet TAKE 1/2 TABLET EVERY DAY 10/15/15   [name], MD  sertraline (ZOLOFT) 25 MG tablet TAKE ONE TABLET BY MOUTH EVERY NIGHT FOR 7-10 DAYS THEN INCREASE TO TWO TABLETS EVERY NIGHT THEREAFTER 02/24/16   Jerene Bears, MD  traMADol (ULTRAM) 50 MG tablet Take 1  tablet (50 mg total) by mouth every 6 (six) hours as needed. 03/11/15   [name], MD    Family History Family History  Problem Relation Age of Onset  . Diabetes Mother   . Skin cancer Mother   . Lung cancer Father   . Lymphoma Sister   . Hypertension Sister   . Diabetes Sister   . Hypertension Sister   . Breast cancer Sister   . Diabetes Sister   . Hypertension Sister   . Inflammatory bowel disease Daughter   . Esophageal cancer Son   . Stroke Neg Hx   . Colon cancer Neg Hx     Social History Social History  Substance Use Topics  . Smoking status: Former Smoker    Packs/day: 2.00    Years: 20.00    Types: Cigarettes    Quit date: 05/09/1975  . Smokeless tobacco: Never Used     Comment: smoked 1958-1977, up to 2 ppd  . Alcohol use 0.0 oz/week     Comment: rare     Allergies   Patient has no known allergies.   Review of Systems Review of Systems  Respiratory: Negative for shortness of breath.   Cardiovascular: Negative for chest pain.  Gastrointestinal: Positive for diarrhea. Negative for abdominal pain, nausea and vomiting.  Musculoskeletal: Positive for gait problem.  Neurological: Positive for weakness. Negative for dizziness, light-headedness, numbness and headaches.  All other systems reviewed and are negative.    Physical Exam Updated Vital Signs BP 128/76   Pulse 79   Temp 98.7 F (37.1 C)   Resp 18   SpO2 97%   Physical Exam  Constitutional: She is oriented to person, place, and time. She appears well-developed and well-nourished.    HENT:  Head: Normocephalic and atraumatic.  Right Ear: External ear normal.  Left Ear: External ear normal.  Nose: Nose normal.  Eyes: EOM are normal. Pupils are equal, round, and reactive to light. Right eye exhibits no discharge. Left eye exhibits no discharge.  Neck: Neck supple.  Cardiovascular: Normal rate, regular rhythm and normal heart sounds.   Pulmonary/Chest: Effort normal and breath sounds normal.  Abdominal: Soft. There is no tenderness.  Neurological: She is alert and oriented to person, place, and time.  CN 3-12 grossly intact. 5/5 strength in all 4 extremities. Grossly normal sensation. Normal finger to nose. Normal gait  Skin: Skin is warm and dry.  Nursing note and vitals reviewed.    ED Treatments / Results  Labs (all labs ordered are listed, but only abnormal results are displayed) Labs Reviewed  PROTIME-INR - Abnormal; Notable for the following:       Result Value   Prothrombin Time 16.2 (*)    All other components within normal limits  APTT - Abnormal; Notable for the following:    aPTT 39 (*)    All other components within normal limits  CBC - Abnormal; Notable for the following:    RBC 3.25 (*)    Hemoglobin 9.4 (*)    HCT 29.5 (*)    RDW 16.4 (*)    All other components within normal limits  CBG MONITORING, ED - Abnormal; Notable for the following:    Glucose-Capillary 205 (*)    All other components within normal limits  I-STAT CHEM 8, ED - Abnormal; Notable for the following:    Glucose, Bld 111 (*)    Hemoglobin 9.2 (*)    HCT 27.0 (*)    All other components within  normal limits  DIFFERENTIAL  RAPID URINE DRUG SCREEN, HOSP PERFORMED  URINALYSIS, ROUTINE W REFLEX MICROSCOPIC  ETHANOL  COMPREHENSIVE METABOLIC PANEL  I-STAT TROPOININ, ED    EKG  EKG Interpretation  Date/Time:  Tuesday April 11 2016 12:06:05 EST Ventricular Rate:  75 PR Interval:    QRS Duration: 88 QT Interval:  444 QTC Calculation: 496 R Axis:   34 Text  Interpretation:  Sinus rhythm Nonspecific repol abnormality, lateral leads Baseline wander in lead(s) I II aVR nonspecific changes new since 2015 Confirmed by Milen Lengacher MD, Sharese Manrique [phone]) on 04/11/2016 12:16:24 PM Also confirmed by Marijah Larranaga MD, [place] [phone]), editor Yehuda Mao [phone])  on 04/11/2016 1:49:36 PM       Radiology Ct Head Wo Contrast  Result Date: 04/11/2016 CLINICAL DATA:  Left-sided facial droop, unsteady gait EXAM: CT HEAD WITHOUT CONTRAST TECHNIQUE: Contiguous axial images were obtained from the base of the skull through the vertex without intravenous contrast. COMPARISON:  MRI 12/29/2015 FINDINGS: Brain: There is atrophy and chronic small vessel disease changes. No acute intracranial abnormality. Specifically, no hemorrhage, hydrocephalus, mass lesion, acute infarction, or significant intracranial injury. Vascular: No hyperdense vessel or unexpected calcification. Skull: No acute calvarial abnormality. Sinuses/Orbits: Visualized paranasal sinuses and mastoids clear. Orbital soft tissues unremarkable. Other: None IMPRESSION: No acute intracranial abnormality. Atrophy, chronic microvascular disease. Electronically Signed   By: Rolm Baptise M.D.   On: 04/11/2016 14:32   Mr Liver W Wo Contrast  Result Date: 04/10/2016 CLINICAL DATA:  Follow up liver lesions.  Cirrhosis. EXAM: MRI ABDOMEN WITHOUT AND WITH CONTRAST TECHNIQUE: Multiplanar multisequence MR imaging of the abdomen was performed both before and after the administration of intravenous contrast. CONTRAST:  16 cc MultiHance COMPARISON:  07/04/2014 FINDINGS: Lower chest: Mild cardiomegaly. Hepatobiliary: Cirrhotic liver with nodular contour. Gallbladder unremarkable, no biliary dilatation. Faint arterial phase enhancing 1.3 by 1.0 cm lesion in segment 6, not appreciably changed from 07/05/2015 although not as avidly enhancing in the arterial phase as before. This lesion is faintly hypointense on later sequences but I do not see a  definite capsule appearance. Inferiorly in the right hepatic lobe, and probably corresponding to the site of the prior biopsy, there is a 1.8 by 1.3 cm faint arterial phase enhancing lesion on image 43/1001 (previously 0.8 cm) which demonstrates washout appearance. This may well correspond to the previously biopsied nodule although I am not completely certain. Previously there is an arterial phase enhancing lesion along the margin of the gallbladder fossa, this lesion is no longer appreciated. Pancreas: In the pancreatic head there is a 1.7 by 1.4 by 1.6 cm (volume = 2 cm^3) cystic lesion (formerly 1.5 by 1.5 by 1.1 cm which may well communicate with the dorsal pancreatic duct Spleen:  Size 13.4 by 9.9 by 5.4 cm (volume = 380 cm^3). Adrenals/Urinary Tract:  Unremarkable Stomach/Bowel: Scattered diverticula of the descending and sigmoid colon. Vascular/Lymphatic:  Unremarkable Other:  No supplemental non-categorized findings. Musculoskeletal: Unremarkable IMPRESSION: 1. The lesion inferiorly in the right hepatic lobe, which may well have been the lesion previously biopsied, demonstrates interval growth and washout appearance. Current long axis size 1.8 cm. Generally this would be considered LI-RADS LR-5, definitely hepatocellular carcinoma. Arguing against this is the recent biopsy result of a regenerative nodule. Again, from the ultrasound I can't quite tell with this is indeed the same nodule that was biopsied, and the appearance is concerning for malignancy. 2. A 1.3 by 1.0 cm lesion in segment 6 of the liver demonstrates arterial phase enhancement and  some washout, no threshold growth, would normally be considered LI-RADS LR-4. 3. The prior third lesion that was seen by the gallbladder fossa is no longer apparent. 4. Cirrhosis. 5. Mild cardiomegaly. 6. Mild enlargement of the cystic lesion along the main pancreatic duct which does communicate. 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 Previously 1.3 cc volume, currently 2.0 cc  volume. Electronically Signed   By: Van Clines M.D.   On: 04/10/2016 15:56    Procedures Procedures (including critical care time)  Medications Ordered in ED Medications - No data to display   Initial Impression / Assessment and Plan / ED Course  I have reviewed the triage vital signs and the nursing notes.  Pertinent labs & imaging results that were available during my care of the patient were reviewed by me and considered in my medical decision making (see chart for details).  Clinical Course as of Apr 11 1534  Tue Apr 11, 2016  1309 Asymptomatic at this time. Normal gait. Labs, CT, consult neuro  [SG]    Clinical Course User Index [SG] Sherwood Gambler, MD    Discussed with Dr. Leonel Ramsay of neurology feels this is unlikely to be TIA. Probably more likely near syncopal. However patient has not had any lightheadedness, dizziness, or syncope. Could also be peripheral neuropathy. At this point given the chronicity of symptoms, no acute symptoms at this time, I think emergent MRI in the ED is not warranted. Will refer to her neurologist (who she sees for mild dementia) and her PCP. Discussed strict return per cautions. She is able to a bili without difficulty in the ED. Has anemia  but mildly lower than baseline.  Final Clinical Impressions(s) / ED Diagnoses   Final diagnoses:  Unsteady gait    New Prescriptions New Prescriptions   No medications on file     Sherwood Gambler, MD 04/11/16 1536

## 2016-04-11 NOTE — Telephone Encounter (Signed)
[facility] - Client [place] [facility] Patient Name: SHAKKIA KAFFENBERGER DOB: 05/17/1940 Initial Comment Caller states mother is leaning to one side, unsteady, concerned about mini-stroke. Nurse Assessment Nurse: Markus Daft, RN, Sherre Poot Date/Time (Eastern Time): 04/11/2016 11:17:26 AM Confirm and document reason for call. If symptomatic, describe symptoms. ---Caller states that her mother is leaning to one side, unsteady. S/ S presented off/on for short time, and again this AM. Does the patient have any new or worsening symptoms? ---Yes Will a triage be completed? ---Yes Related visit to physician within the last 2 weeks? ---No Does the PT have any chronic conditions? (i.e. diabetes, asthma, etc.) ---Yes List chronic conditions. ---h/o HTN, CA, DM, mild dementia Is this a behavioral health or substance abuse call? ---No Guidelines Guideline Title Affirmed Question Affirmed Notes Neurologic Deficit [1] Weakness (i.e., paralysis, loss of muscle strength) of the face, arm / hand, or leg / foot on one side of the body AND [2] sudden onset AND [3] present now Final Disposition User Call EMS 911 Now Markus Daft, RN, [place] Disagree/Comply: Disagree Disagree/Comply Reason: Disagree with instructions Dtr plans to take her to ED instead of calling 911.

## 2016-04-11 NOTE — Telephone Encounter (Signed)
Pt not in ED yet. Will monitor chart.

## 2016-04-11 NOTE — Telephone Encounter (Signed)
Pt's daughter called in stating the pt is having Poss Stroke Sx. Transferred to [facility]

## 2016-04-11 NOTE — ED Notes (Addendum)
EDP made aware of patient's symptoms and that they have resolved at this time.

## 2016-04-12 ENCOUNTER — Other Ambulatory Visit: Payer: Self-pay | Admitting: Internal Medicine

## 2016-04-13 ENCOUNTER — Telehealth: Payer: Self-pay | Admitting: Internal Medicine

## 2016-04-13 NOTE — Telephone Encounter (Signed)
See results note. 

## 2016-04-14 ENCOUNTER — Telehealth: Payer: Self-pay | Admitting: Internal Medicine

## 2016-04-14 MED ORDER — DIPHENOXYLATE-ATROPINE 2.5-0.025 MG PO TABS: ORAL_TABLET | ORAL | 0 refills | Status: DC

## 2016-04-14 NOTE — Telephone Encounter (Signed)
Rx sent 

## 2016-04-17 ENCOUNTER — Telehealth: Payer: Self-pay | Admitting: Oncology

## 2016-04-17 ENCOUNTER — Ambulatory Visit (HOSPITAL_BASED_OUTPATIENT_CLINIC_OR_DEPARTMENT_OTHER): Payer: Medicare Other | Admitting: Oncology

## 2016-04-17 VITALS — BP 122/75 | HR 73 | Temp 98.7°F | Resp 18 | Ht 65.0 in | Wt 163.5 lb

## 2016-04-17 DIAGNOSIS — D649 Anemia, unspecified: Secondary | ICD-10-CM

## 2016-04-17 DIAGNOSIS — K7469 Other cirrhosis of liver: Secondary | ICD-10-CM | POA: Diagnosis not present

## 2016-04-17 DIAGNOSIS — C50111 Malignant neoplasm of central portion of right female breast: Secondary | ICD-10-CM | POA: Diagnosis not present

## 2016-04-17 DIAGNOSIS — I208 Other forms of angina pectoris: Secondary | ICD-10-CM | POA: Diagnosis not present

## 2016-04-17 NOTE — Progress Notes (Signed)
  [facility] OFFICE PROGRESS NOTE   Diagnosis: History of breast cancer, cirrhosis, liver lesions  INTERVAL HISTORY:   [name] [name] returns as scheduled per she underwent a biopsy of a right liver lesion on 07/19/2015. The pathology revealed cirrhosis and no evidence of metastatic carcinoma or hepatocellular carcinoma.  She has been diagnosed with dementia.  A repeat MRI of the abdomen on 04/10/2016 revealed an unchanged 1.3 cm segment 6 lesion, cirrhosis, a 1.8 cm faint arterial phase enhancing lesion in the right liver-potentially corresponding to the previous biopsy lesion that has enlarged, and resolution of an arterial enhancing lesion at the gallbladder fossa. The lesion in the right liver is favored to represent an [place]. Mild enlargement of a cystic lesion at the [place] pancreas duct consistent with a side branch IPMN.   She has a good appetite. She was seen in the emergency room 04/11/2016 with an unsteady gait. A CT revealed no acute intracranial abnormality.       Objective:  Vital signs in last 24 hours:  Blood pressure 122/75, pulse 73, temperature 98.7 F (37.1 C), temperature source Oral, resp. rate 18, height 5\' 5"  (1.651 m), weight 163 lb 8 oz (74.2 kg), SpO2 99 %.    HEENT: Neck without mass Lymphatics: No cervical, supra-clavicular, axillary, or inguinal nodes Resp: Lungs clear bilaterally Cardio: Regular rate and rhythm GI: No hepatosplenomegaly, no apparent ascites, nontender Vascular: No leg edema Neuro: Alert, follows commands  Breast: Status post right mastectomy. Radiation changes over the right anterior chest wall. No nodules. Left breast without mass.  Lab Results:  Lab Results  Component Value Date   WBC 4.7 04/11/2016   HGB 9.2 (L) 04/11/2016   HCT 27.0 (L) 04/11/2016   MCV 90.8 04/11/2016   PLT 164 04/11/2016   NEUTROABS 2.6 04/11/2016    Medications: I have reviewed the patient's current medications.  Assessment/Plan: 1. NASH  related cirrhosis  2. MRI liver 07/05/2015 with 3 hypervascular lesions concerning for multifocal hepatocellular carcinoma  Ultrasound-guided biopsy of a right liver lesion on 07/19/2015-No [facility] or metastatic carcinoma  MRI liver 04/10/2016-enlargement of right hepatic lobe lesion with characteristics of HCC, 1.3 cm segment 6 lesion, resolution of a third lesion near the gallbladder fossa  3.  Memory loss secondary to dementia versus hepatic encephalopathy  4.  History of stage III right-sided breast cancer diagnosed in 2001  5.  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  Diabetes  7.  Stage II (T3 N0), right colon, 2005  8.  Removal of a benign greater curvature polyp 07/15/2015 9.  Anemia in the emergency room 04/11/2016    Disposition:  [name] remains in clinical remission from breast cancer. She has at least one liver lesions suspicious for [facility]. She is scheduled to see interventional radiology next week to consider the indication for a biopsy or ablation procedure. I think it would be reasonable to follow her with observation given her age, dementia, and comorbid conditions.  After [name] left the office today I noted she had significant anemia when seen in the emergency room 04/11/2016. We will be sure Dr. Sarajane Jews follows up on the hemoglobin. Review of the chart indicates a history of chronic anemia. I am available to evaluate the anemia as needed.  Mrs. [name] [name] will return for an office visit in 9 months. I am available to see her sooner as needed.    Betsy Coder, MD  04/17/2016  3:43 PM

## 2016-04-17 NOTE — Telephone Encounter (Signed)
Appointments scheduled per 12/11 LOS. Patient given AVS report and calendars with future scheduled appointments. °

## 2016-04-19 DIAGNOSIS — L821 Other seborrheic keratosis: Secondary | ICD-10-CM | POA: Diagnosis not present

## 2016-04-19 DIAGNOSIS — L820 Inflamed seborrheic keratosis: Secondary | ICD-10-CM | POA: Diagnosis not present

## 2016-04-19 DIAGNOSIS — B079 Viral wart, unspecified: Secondary | ICD-10-CM | POA: Diagnosis not present

## 2016-04-21 DIAGNOSIS — H2512 Age-related nuclear cataract, left eye: Secondary | ICD-10-CM | POA: Diagnosis not present

## 2016-04-23 ENCOUNTER — Other Ambulatory Visit: Payer: Self-pay | Admitting: Cardiovascular Disease

## 2016-04-25 ENCOUNTER — Other Ambulatory Visit: Payer: Self-pay | Admitting: Interventional Radiology

## 2016-04-25 ENCOUNTER — Ambulatory Visit
Admission: RE | Admit: 2016-04-25 | Discharge: 2016-04-25 | Disposition: A | Discharge status: Home / Self Care | Payer: Medicare Other | Source: Ambulatory Visit | Attending: Interventional Radiology | Admitting: Interventional Radiology

## 2016-04-25 DIAGNOSIS — R160 Hepatomegaly, not elsewhere classified: Secondary | ICD-10-CM

## 2016-04-25 DIAGNOSIS — K7689 Other specified diseases of liver: Secondary | ICD-10-CM

## 2016-04-25 HISTORY — PX: IR GENERIC HISTORICAL: IMG1180011

## 2016-04-25 NOTE — Progress Notes (Signed)
Patient ID: [name] [name], female   DOB: 08/29/1940, 75 y.o.   MRN: [id number]       Chief Complaint: Patient was seen in consultation today for Liver lesion at the request of Dr. Dominica Severin B. Sherrill  Referring Physician(s): Dr. Dominica Severin B. Sherrill  Supervising Physician: [name]  History of Present Illness: [name] [name] is a 75 y.o. female with a history of breast cancer, bladder cancer, DM, and Barrett's esophagus who underwent a biopsy of a liver lesion in February 2017 which revealed cirrhosis, but no evidence of malignancy.  She has been followed by Dr. Benay Spice. She recently underwent and MRI of her abdomen which revealed: 1. The lesion inferiorly in the right hepatic lobe, which may well have been the lesion previously biopsied, demonstrates interval growth and washout appearance. Current long axis size 1.8 cm. Generally this would be considered LI-RADS LR-5, definitely hepatocellular carcinoma. Arguing against this is the recent biopsy result of a regenerative nodule. Again, from the ultrasound I can't quite tell with this is indeed the same nodule that was biopsied, and the appearance is concerning for malignancy. 2. A 1.3 by 1.0 cm lesion in segment 6 of the liver demonstrates arterial phase enhancement and some washout, no threshold growth, would normally be considered LI-RADS LR-4. 3. The prior third lesion that was seen by the gallbladder fossa is no longer apparent. 4. Cirrhosis. 5. Mild cardiomegaly. 6. Mild enlargement of the cystic lesion along the main pancreatic duct which does communicate. 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 Previously 1.3 cc volume, currently 2.0 cc volume.  Due to this concern for malignancy despite negative biopsy earlier this year, she has been sent to see Dr. Laurence Ferrari to discuss repeat biopsy vs possible ablation.  The patient has some dementia, but otherwise reports no abdominal pain, nausea, vomiting, chest pain, shortness of breath, weight  loss, etc.    Past Medical History:  Diagnosis Date  . Adenocarcinoma, breast [facility])    sees Dr. Marcy Panning   . Allergy    environmental  . Arthritis    sees Dr. Hurley Cisco   . Barrett esophagus 12/20/11  . Bladder cancer ([place])   . Cancer, hepatocellular ([place])   . Diabetes mellitus    sees Dr. Chalmers Cater   . Diverticulosis of colon (without mention of hemorrhage) 09/26/2004,09/10/2006  . Gastritis   . GERD (gastroesophageal reflux disease)   . Hemorrhoids   . Hepatic cirrhosis ([place])   . Hepatic steatosis   . Hiatal hernia   . Hyperglycemia   . Hyperlipidemia   . Hypertension   . IBS (irritable bowel syndrome)   . Inflammatory polyps of colon ([place])   . Iron deficiency anemia   . Malignant neoplasm of colon, unspecified site 07/08/2003  . Sleep apnea    sees Dr. Gwenette Greet   . Transaminase or LDH elevation    sees Dr. Zenovia Jarred     Past Surgical History:  Procedure Laterality Date  . bladder cancer 2001    . BREAST SURGERY  2001   lymph nodes removed and rt mastectomy  . CARPAL TUNNEL RELEASE    . [place]   for diverticulitis  . COLONOSCOPY    . ESOPHAGEAL DILATION    . incisonal hernia    . mastectomy and biopsy    . sigmoid colectomy 2005     for cancer  . 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  Medications: Prior to Admission medications   Medication Sig Start Date End Date Taking? Authorizing Provider  aspirin EC 81 MG tablet Take 81 mg by mouth at bedtime.    Historical Provider, MD  Calcium Carbonate-Vitamin D (CALCIUM + D PO) Take 1 tablet by mouth daily.    Historical Provider, MD  diphenoxylate-atropine (LOMOTIL) 2.5-0.025 MG tablet TAKE ONE TABLET BY MOUTH THREE TIMES DAILY AS NEEDED FOR  DIARRHEA  OR  LOOSE  STOOLS 04/14/16   Jerene Bears, MD  donepezil (ARICEPT) 10 MG tablet Take 1 tablet (10 mg total) by mouth at bedtime. 06/23/15   [name], MD  fish oil-omega-3 fatty acids 1000 MG capsule  Take 1 g by mouth daily.     Historical Provider, MD  furosemide (LASIX) 40 MG tablet Take 1 tablet (40 mg total) by mouth daily. 07/30/15   Lorretta Harp, MD  glucose blood test strip 1 each by Other route 2 (two) times daily. One Touch    Historical Provider, MD  Insulin Isophane Human (NOVOLIN N RELION [place]) Inject 30-40 Units into the skin 2 (two) times daily. 30units Q QM, 40 units QPM    Historical Provider, MD  meclizine (ANTIVERT) 25 MG tablet Take 1 tablet (25 mg total) by mouth every 4 (four) hours as needed for dizziness. Patient not taking: Reported on 04/17/2016 05/13/14   [name], MD  memantine (NAMENDA) 10 MG tablet Take 1 tablet (10 mg total) by mouth 2 (two) times daily. 06/23/15   [name], MD  metFORMIN (GLUCOPHAGE-XR) 500 MG 24 hr tablet Take 1,000 mg by mouth 2 (two) times daily. 04/14/13   Historical Provider, MD  metoprolol tartrate (LOPRESSOR) 25 MG tablet TAKE 1 TABLET TWICE DAILY 04/24/16   Lorretta Harp, MD  nitroGLYCERIN (NITRODUR - DOSED IN MG/24 HR) 0.2 mg/hr patch Reported on 08/23/2015 01/17/14   Historical Provider, MD  omeprazole (PRILOSEC) 20 MG capsule Take 1 capsule (20 mg total) by mouth 2 (two) times daily before a meal. 12/20/15   Jerene Bears, MD  ondansetron (ZOFRAN-ODT) 4 MG disintegrating tablet DISSOLVE ONE TABLET IN MOUTH EVERY 8 HOURS AS NEEDED FOR NAUSEA OR VOMITING 11/29/15   Jerene Bears, MD  potassium chloride SA (K-DUR,KLOR-CON) 20 MEQ tablet Take 1 tablet (20 mEq total) by mouth daily. 07/30/15   Lorretta Harp, MD  RELION INSULIN SYRINGE 1ML/31G 31G X 5/16" 1 ML MISC  12/08/11   Historical Provider, MD  rosuvastatin (CRESTOR) 10 MG tablet TAKE 1/2 TABLET EVERY DAY 10/15/15   [name], MD  sertraline (ZOLOFT) 25 MG tablet TAKE ONE TABLET BY MOUTH EVERY NIGHT FOR 7-10 DAYS THEN INCREASE TO TWO TABLETS EVERY NIGHT THEREAFTER 02/24/16   Jerene Bears, MD  traMADol (ULTRAM) 50 MG tablet Take 1 tablet (50 mg total) by mouth every 6 (six) hours as  needed. 03/11/15   [name], MD     Family History  Problem Relation Age of Onset  . Diabetes Mother   . Skin cancer Mother   . Lung cancer Father   . Lymphoma Sister   . Hypertension Sister   . Diabetes Sister   . Hypertension Sister   . Breast cancer Sister   . Diabetes Sister   . Hypertension Sister   . Inflammatory bowel disease Daughter   . Esophageal cancer Son   . Stroke Neg Hx   . Colon cancer Neg Hx     Social History  Social History  . Marital status: Married    Spouse name: Carmelia Roller  . Number of children: 2  . Years of education: N/A   Occupational History  . 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 Smoking status: Former Smoker    Packs/day: 2.00    Years: 20.00    Types: Cigarettes    Quit date: 05/09/1975  . Smokeless tobacco: Never Used     Comment: smoked 1958-1977, up to 2 ppd  . Alcohol use 0.0 oz/week     Comment: rare  . Drug use:     Types: Methylphenidate  . Sexual activity: Not on file   Other Topics Concern  . 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  All other systems are negative, except for lower back pain that is felt to be arthritis.  Review of Systems  Vital Signs: BP 122/65 (BP Location: Left Arm, Patient Position: Sitting, Cuff Size: Normal)   Pulse 72   Temp 98.9 F (37.2 C) (Oral)   Resp 15   Ht 5' 5.5" (1.664 m)   Wt 167 lb (75.8 kg)   SpO2 98%   BMI 27.37 kg/m   Physical Exam General: pleasant, elderly white female who is laying in bed in NAD HEENT: head is normocephalic, atraumatic.  Sclera are noninjected.  PERRL.  Ears and nose without any masses or lesions.  Mouth is pink and moist Heart: regular, rate, and rhythm.  Normal s1,s2. +murmur No gallops, or rubs noted.  Palpable  radial pulses bilaterally Lungs: CTAB, no wheezes, rhonchi, or rales noted.  Respiratory effort nonlabored Abd: soft, NT, ND, +BS, no masses, hernias, or organomegaly Psych: A&Ox3 with an appropriate affect, but is clearly forgetful as she asks her husband most questions I ask her to get the answer   Imaging: Ct Head Wo Contrast  Result Date: 04/11/2016 CLINICAL DATA:  Left-sided facial droop, unsteady gait EXAM: CT HEAD WITHOUT CONTRAST TECHNIQUE: Contiguous axial images were obtained from the base of the skull through the vertex without intravenous contrast. COMPARISON:  MRI 12/29/2015 FINDINGS: Brain: There is atrophy and chronic small vessel disease changes. No acute intracranial abnormality. Specifically, no hemorrhage, hydrocephalus, mass lesion, acute infarction, or significant intracranial injury. Vascular: No hyperdense vessel or unexpected calcification. Skull: No acute calvarial abnormality. Sinuses/Orbits: Visualized paranasal sinuses and mastoids clear. Orbital soft tissues unremarkable. Other: None IMPRESSION: No acute intracranial abnormality. Atrophy, chronic microvascular disease. Electronically Signed   By: Rolm Baptise M.D.   On: 04/11/2016 14:32   Mr Liver W Wo Contrast  Result Date: 04/10/2016 CLINICAL DATA:  Follow up liver lesions.  Cirrhosis. EXAM: MRI ABDOMEN WITHOUT AND WITH CONTRAST TECHNIQUE: Multiplanar multisequence MR imaging of the abdomen was performed both before and after the administration of intravenous contrast. CONTRAST:  16 cc MultiHance COMPARISON:  07/04/2014 FINDINGS: Lower chest: Mild cardiomegaly. Hepatobiliary: Cirrhotic liver with nodular contour. Gallbladder unremarkable, no biliary dilatation. Faint arterial phase enhancing 1.3 by 1.0 cm lesion in segment 6, not appreciably changed from 07/05/2015 although not as avidly enhancing in the arterial phase as before. This lesion is faintly hypointense on later sequences but I do not see a definite capsule  appearance. Inferiorly in the  right hepatic lobe, and probably corresponding to the site of the prior biopsy, there is a 1.8 by 1.3 cm faint arterial phase enhancing lesion on image 43/1001 (previously 0.8 cm) which demonstrates washout appearance. This may well correspond to the previously biopsied nodule although I am not completely certain. Previously there is an arterial phase enhancing lesion along the margin of the gallbladder fossa, this lesion is no longer appreciated. Pancreas: In the pancreatic head there is a 1.7 by 1.4 by 1.6 cm (volume = 2 cm^3) cystic lesion (formerly 1.5 by 1.5 by 1.1 cm which may well communicate with the dorsal pancreatic duct Spleen:  Size 13.4 by 9.9 by 5.4 cm (volume = 380 cm^3). Adrenals/Urinary Tract:  Unremarkable Stomach/Bowel: Scattered diverticula of the descending and sigmoid colon. Vascular/Lymphatic:  Unremarkable Other:  No supplemental non-categorized findings. Musculoskeletal: Unremarkable IMPRESSION: 1. The lesion inferiorly in the right hepatic lobe, which may well have been the lesion previously biopsied, demonstrates interval growth and washout appearance. Current long axis size 1.8 cm. Generally this would be considered LI-RADS LR-5, definitely hepatocellular carcinoma. Arguing against this is the recent biopsy result of a regenerative nodule. Again, from the ultrasound I can't quite tell with this is indeed the same nodule that was biopsied, and the appearance is concerning for malignancy. 2. A 1.3 by 1.0 cm lesion in segment 6 of the liver demonstrates arterial phase enhancement and some washout, no threshold growth, would normally be considered LI-RADS LR-4. 3. The prior third lesion that was seen by the gallbladder fossa is no longer apparent. 4. Cirrhosis. 5. Mild cardiomegaly. 6. Mild enlargement of the cystic lesion along the main pancreatic duct which does communicate. 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 Previously 1.3 cc volume, currently 2.0 cc volume.  Electronically Signed   By: Van Clines M.D.   On: 04/10/2016 15:56    Labs:  CBC:  Recent Labs  07/02/15 1000 07/19/15 1145 10/19/15 1440 04/11/16 1457 04/11/16 1504  WBC 4.9 4.7 4.7 4.7  --   HGB 11.4* 11.2* 11.9* 9.4* 9.2*  HCT 33.9* 35.2* 36.0 29.5* 27.0*  PLT 172.0 171 164.0 164  --     COAGS:  Recent Labs  07/02/15 1000 07/19/15 1145 10/19/15 1440 04/11/16 1457  INR 1.4* 1.39 1.3* 1.29  APTT  --  38*  --  39*    BMP:  Recent Labs  10/19/15 1440 12/22/15 1113 04/10/16 1304 04/11/16 1457 04/11/16 1504  NA 140  --   --  141 142  K 4.2  --   --  3.6 3.6  CL 101  --   --  106 104  CO2 27  --   --  28  --   GLUCOSE 135*  --   --  112* 111*  BUN 17 13  --  11 12  CALCIUM 10.4  --   --  9.8  --   CREATININE 0.97 0.93 0.80 0.88 0.80  GFRNONAA  --   --   --  >60  --   GFRAA  --   --   --  >60  --     LIVER FUNCTION TESTS:  Recent Labs  06/21/15 0910 10/19/15 1440 04/11/16 1457  BILITOT 1.0 1.1 0.9  AST 34 32 42*  ALT 19 21 19   ALKPHOS 97 112 109  PROT 6.9 8.1 6.8  ALBUMIN 3.4* 4.1 3.1*    TUMOR MARKERS:  Recent Labs  08/13/15 1354  AFPTM 2.7    Assessment:  1. Multiple liver lesions,  concern for malignancy  The patient underwent a liver biopsy in February of this year which confirmed a diagnosis of cirrhosis, but no evidence of malignancy.  A recent MRI shows concerning features of this lesion that may represent [place].  She has been sent to Dr. Laurence Ferrari for evaluation for biopsy vs ablation of this lesion.  After review, it is felt the patient would like benefit from starting with a repeat biopsy to start with.  The patient and family appear to be agreeable with this plan.   Electronically Signed: Henreitta Cea 04/25/2016, 3:06 PM   Please refer to Dr. Katrinka Blazing attestation of this note for management and plan.

## 2016-04-25 NOTE — Progress Notes (Signed)
Chief Complaint: Patient was seen in follow-up today for  Chief Complaint  Patient presents with  . Follow-up    NASH   at the request of Necia Kamm  Referring Physician(s): Markell Sciascia  History of Present Illness: [name] [name] is a 74 y.o. female with a history of [name] cirrhosis and liver lesions which are concerning by MRI evaluation. However, the lesions are very small. One of the lesions was previously biopsied in April 2017 with a pathologic diagnosis of regenerating nodule. Continued imaging surveillance was recommended.  Patient has an MRI today which demonstrates slight interval enlargement and persistent suspicious imaging characteristics consistent with a lie reds 5 (likely hepatocellular cancer was parental lesion. Therefore, repeat biopsy is recommended. I explained this in detail to the patient and her family who are accompanying her.  The most suspicious lesion is a 1.8 cm lesion in hepatic segment 6.  Past Medical History:  Diagnosis Date  . Adenocarcinoma, breast [facility])    sees Dr. Marcy Panning   . Allergy    environmental  . Arthritis    sees Dr. Hurley Cisco   . Barrett esophagus 12/20/11  . Bladder cancer ([place])   . Cancer, hepatocellular ([place])   . Diabetes mellitus    sees Dr. Chalmers Cater   . Diverticulosis of colon (without mention of hemorrhage) 09/26/2004,09/10/2006  . Gastritis   . GERD (gastroesophageal reflux disease)   . Hemorrhoids   . Hepatic cirrhosis ([place])   . Hepatic steatosis   . Hiatal hernia   . Hyperglycemia   . Hyperlipidemia   . Hypertension   . IBS (irritable bowel syndrome)   . Inflammatory polyps of colon ([place])   . Iron deficiency anemia   . Malignant neoplasm of colon, unspecified site 07/08/2003  . Sleep apnea    sees Dr. Gwenette Greet   . Transaminase or LDH elevation    sees Dr. Zenovia Jarred     Past Surgical History:  Procedure Laterality Date  . bladder cancer 2001    . BREAST SURGERY  2001   lymph nodes  removed and rt mastectomy  . CARPAL TUNNEL RELEASE    . [place]   for diverticulitis  . COLONOSCOPY    . ESOPHAGEAL DILATION    . incisonal hernia    . mastectomy and biopsy    . sigmoid colectomy 2005     for cancer  . 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  Medications: Prior to Admission medications   Medication Sig Start Date End Date Taking? Authorizing Provider  aspirin EC 81 MG tablet Take 81 mg by mouth at bedtime.    Historical Provider, MD  Calcium Carbonate-Vitamin D (CALCIUM + D PO) Take 1 tablet by mouth daily.    Historical Provider, MD  diphenoxylate-atropine (LOMOTIL) 2.5-0.025 MG tablet TAKE ONE TABLET BY MOUTH THREE TIMES DAILY AS NEEDED FOR  DIARRHEA  OR  LOOSE  STOOLS 04/14/16   Jerene Bears, MD  donepezil (ARICEPT) 10 MG tablet Take 1 tablet (10 mg total) by mouth at bedtime. 06/23/15   [name], MD  fish oil-omega-3 fatty acids 1000 MG capsule Take 1 g by mouth daily.     Historical Provider, MD  furosemide (LASIX) 40 MG tablet Take 1 tablet (40 mg total) by mouth daily. 07/30/15   Lorretta Harp, MD  glucose blood test strip 1 each by Other route 2 (two)  times daily. One Touch    Historical Provider, MD  Insulin Isophane Human (NOVOLIN N RELION [place]) Inject 30-40 Units into the skin 2 (two) times daily. 30units Q QM, 40 units QPM    Historical Provider, MD  meclizine (ANTIVERT) 25 MG tablet Take 1 tablet (25 mg total) by mouth every 4 (four) hours as needed for dizziness. Patient not taking: Reported on 04/17/2016 05/13/14   [name], MD  memantine (NAMENDA) 10 MG tablet Take 1 tablet (10 mg total) by mouth 2 (two) times daily. 06/23/15   [name], MD  metFORMIN (GLUCOPHAGE-XR) 500 MG 24 hr tablet Take 1,000 mg by mouth 2 (two) times daily. 04/14/13   Historical Provider, MD  metoprolol tartrate (LOPRESSOR) 25 MG tablet TAKE 1 TABLET TWICE DAILY 04/24/16   Lorretta Harp, MD  nitroGLYCERIN (NITRODUR  - DOSED IN MG/24 HR) 0.2 mg/hr patch Reported on 08/23/2015 01/17/14   Historical Provider, MD  omeprazole (PRILOSEC) 20 MG capsule Take 1 capsule (20 mg total) by mouth 2 (two) times daily before a meal. 12/20/15   Jerene Bears, MD  ondansetron (ZOFRAN-ODT) 4 MG disintegrating tablet DISSOLVE ONE TABLET IN MOUTH EVERY 8 HOURS AS NEEDED FOR NAUSEA OR VOMITING 11/29/15   Jerene Bears, MD  potassium chloride SA (K-DUR,KLOR-CON) 20 MEQ tablet Take 1 tablet (20 mEq total) by mouth daily. 07/30/15   Lorretta Harp, MD  RELION INSULIN SYRINGE 1ML/31G 31G X 5/16" 1 ML MISC  12/08/11   Historical Provider, MD  rosuvastatin (CRESTOR) 10 MG tablet TAKE 1/2 TABLET EVERY DAY 10/15/15   [name], MD  sertraline (ZOLOFT) 25 MG tablet TAKE ONE TABLET BY MOUTH EVERY NIGHT FOR 7-10 DAYS THEN INCREASE TO TWO TABLETS EVERY NIGHT THEREAFTER 02/24/16   Jerene Bears, MD  traMADol (ULTRAM) 50 MG tablet Take 1 tablet (50 mg total) by mouth every 6 (six) hours as needed. 03/11/15   [name], MD     Family History  Problem Relation Age of Onset  . Diabetes Mother   . Skin cancer Mother   . Lung cancer Father   . Lymphoma Sister   . Hypertension Sister   . Diabetes Sister   . Hypertension Sister   . Breast cancer Sister   . Diabetes Sister   . Hypertension Sister   . Inflammatory bowel disease Daughter   . Esophageal cancer Son   . Stroke Neg Hx   . Colon cancer Neg Hx     Social History   Social History  . Marital status: Married    Spouse name: Carmelia Roller  . Number of children: 2  . Years of education: N/A   Occupational History  . 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 Smoking status: Former Smoker    Packs/day: 2.00    Years: 20.00    Types: Cigarettes    Quit date: 05/09/1975  . Smokeless tobacco: Never Used     Comment: smoked 1958-1977, up to 2 ppd  . Alcohol use 0.0 oz/week     Comment: rare  . Drug use:     Types: Methylphenidate  . Sexual  activity: Not on file   Other Topics Concern  . 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  All other systems are negative.  Review of Systems  Vital Signs: BP 122/65 (BP Location: Left Arm, Patient Position: Sitting, Cuff Size: Normal)   Pulse 72   Temp 98.9 F (37.2 C) (Oral)   Resp 15   Ht 5' 5.5" (1.664 m)   Wt 167 lb (75.8 kg)   SpO2 98%   BMI 27.37 kg/m   Physical Exam  Constitutional: She is oriented to person, place, and time. She appears well-developed and well-nourished.  HENT:  Head: Normocephalic and atraumatic.  Eyes: No scleral icterus.  Cardiovascular: Normal rate.   Pulmonary/Chest: Effort normal.  Abdominal: Soft. She exhibits no distension. There is no tenderness.  Neurological: She is alert and oriented to person, place, and time.  Skin: Skin is warm and dry.  Psychiatric: She has a normal mood and affect. Her behavior is normal.  Nursing note and vitals reviewed.   Imaging: Ct Head Wo Contrast  Result Date: 04/11/2016 CLINICAL DATA:  Left-sided facial droop, unsteady gait EXAM: CT HEAD WITHOUT CONTRAST TECHNIQUE: Contiguous axial images were obtained from the base of the skull through the vertex without intravenous contrast. COMPARISON:  MRI 12/29/2015 FINDINGS: Brain: There is atrophy and chronic small vessel disease changes. No acute intracranial abnormality. Specifically, no hemorrhage, hydrocephalus, mass lesion, acute infarction, or significant intracranial injury. Vascular: No hyperdense vessel or unexpected calcification. Skull: No acute calvarial abnormality. Sinuses/Orbits: Visualized paranasal sinuses and mastoids clear. Orbital soft tissues unremarkable. Other: None IMPRESSION: No acute intracranial abnormality. Atrophy, chronic microvascular disease.  Electronically Signed   By: Rolm Baptise M.D.   On: 04/11/2016 14:32   Mr Liver W Wo Contrast  Result Date: 04/10/2016 CLINICAL DATA:  Follow up liver lesions.  Cirrhosis. EXAM: MRI ABDOMEN WITHOUT AND WITH CONTRAST TECHNIQUE: Multiplanar multisequence MR imaging of the abdomen was performed both before and after the administration of intravenous contrast. CONTRAST:  16 cc MultiHance COMPARISON:  07/04/2014 FINDINGS: Lower chest: Mild cardiomegaly. Hepatobiliary: Cirrhotic liver with nodular contour. Gallbladder unremarkable, no biliary dilatation. Faint arterial phase enhancing 1.3 by 1.0 cm lesion in segment 6, not appreciably changed from 07/05/2015 although not as avidly enhancing in the arterial phase as before. This lesion is faintly hypointense on later sequences but I do not see a definite capsule appearance. Inferiorly in the right hepatic lobe, and probably corresponding to the site of the prior biopsy, there is a 1.8 by 1.3 cm faint arterial phase enhancing lesion on image 43/1001 (previously 0.8 cm) which demonstrates washout appearance. This may well correspond to the previously biopsied nodule although I am not completely certain. Previously there is an arterial phase enhancing lesion along the margin of the gallbladder fossa, this lesion is no longer appreciated. Pancreas: In the pancreatic head there is a 1.7 by 1.4 by 1.6 cm (volume = 2 cm^3) cystic lesion (formerly 1.5 by 1.5 by 1.1 cm which may well communicate with the dorsal pancreatic duct Spleen:  Size 13.4 by 9.9 by 5.4 cm (volume = 380 cm^3). Adrenals/Urinary Tract:  Unremarkable Stomach/Bowel: Scattered diverticula of the descending and sigmoid colon. Vascular/Lymphatic:  Unremarkable Other:  No supplemental non-categorized findings. Musculoskeletal: Unremarkable IMPRESSION: 1. The lesion inferiorly in the right hepatic lobe, which may well have been the lesion previously biopsied, demonstrates interval growth and washout appearance.  Current long axis size 1.8 cm. Generally this would be considered LI-RADS LR-5, definitely hepatocellular carcinoma. Arguing against this is the recent biopsy result  of a regenerative nodule. Again, from the ultrasound I can't quite tell with this is indeed the same nodule that was biopsied, and the appearance is concerning for malignancy. 2. A 1.3 by 1.0 cm lesion in segment 6 of the liver demonstrates arterial phase enhancement and some washout, no threshold growth, would normally be considered LI-RADS LR-4. 3. The prior third lesion that was seen by the gallbladder fossa is no longer apparent. 4. Cirrhosis. 5. Mild cardiomegaly. 6. Mild enlargement of the cystic lesion along the main pancreatic duct which does communicate. 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 Previously 1.3 cc volume, currently 2.0 cc volume. Electronically Signed   By: Van Clines M.D.   On: 04/10/2016 15:56    Labs:  CBC:  Recent Labs  07/02/15 1000 07/19/15 1145 10/19/15 1440 04/11/16 1457 04/11/16 1504  WBC 4.9 4.7 4.7 4.7  --   HGB 11.4* 11.2* 11.9* 9.4* 9.2*  HCT 33.9* 35.2* 36.0 29.5* 27.0*  PLT 172.0 171 164.0 164  --     COAGS:  Recent Labs  07/02/15 1000 07/19/15 1145 10/19/15 1440 04/11/16 1457  INR 1.4* 1.39 1.3* 1.29  APTT  --  38*  --  39*    BMP:  Recent Labs  10/19/15 1440 12/22/15 1113 04/10/16 1304 04/11/16 1457 04/11/16 1504  NA 140  --   --  141 142  K 4.2  --   --  3.6 3.6  CL 101  --   --  106 104  CO2 27  --   --  28  --   GLUCOSE 135*  --   --  112* 111*  BUN 17 13  --  11 12  CALCIUM 10.4  --   --  9.8  --   CREATININE 0.97 0.93 0.80 0.88 0.80  GFRNONAA  --   --   --  >60  --   GFRAA  --   --   --  >60  --     LIVER FUNCTION TESTS:  Recent Labs  06/21/15 0910 10/19/15 1440 04/11/16 1457  BILITOT 1.0 1.1 0.9  AST 34 32 42*  ALT 19 21 19   ALKPHOS 97 112 109  PROT 6.9 8.1 6.8  ALBUMIN 3.4* 4.1 3.1*    TUMOR MARKERS:  Recent Labs  08/13/15 1354  AFPTM 2.7      Assessment and Plan:  Slight interval enlargement of previously biopsied segment 6 lesion. This raises the possibility of interval malignant degeneration. Repeat biopsy is recommended.  1.)  Shedule for ultrasound guided biopsy of the hepatic segment 6 lesion. If this proves to represent hepatocellular carcinoma she would likely be a candidate for percutaneous thermal ablation.   Electronically Signed: [name] 04/25/2016, 4:54 PM   I spent a total of  15 Minutes in face to face in clinical consultation, greater than 50% of which was counseling/coordinating care for liver nodule, cirrhosis.

## 2016-05-03 ENCOUNTER — Other Ambulatory Visit: Payer: Self-pay | Admitting: General Surgery

## 2016-05-04 ENCOUNTER — Ambulatory Visit (HOSPITAL_COMMUNITY)
Admission: RE | Admit: 2016-05-04 | Discharge: 2016-05-04 | Disposition: A | Discharge status: Home / Self Care | Payer: Medicare Other | Source: Ambulatory Visit | Attending: Interventional Radiology | Admitting: Interventional Radiology

## 2016-05-04 ENCOUNTER — Encounter (HOSPITAL_COMMUNITY): Payer: Self-pay

## 2016-05-04 ENCOUNTER — Other Ambulatory Visit: Payer: Self-pay | Admitting: Interventional Radiology

## 2016-05-04 DIAGNOSIS — E785 Hyperlipidemia, unspecified: Secondary | ICD-10-CM | POA: Diagnosis not present

## 2016-05-04 DIAGNOSIS — K7581 Nonalcoholic steatohepatitis (NASH): Secondary | ICD-10-CM | POA: Diagnosis not present

## 2016-05-04 DIAGNOSIS — I10 Essential (primary) hypertension: Secondary | ICD-10-CM | POA: Diagnosis not present

## 2016-05-04 DIAGNOSIS — K7689 Other specified diseases of liver: Secondary | ICD-10-CM | POA: Insufficient documentation

## 2016-05-04 DIAGNOSIS — K219 Gastro-esophageal reflux disease without esophagitis: Secondary | ICD-10-CM | POA: Insufficient documentation

## 2016-05-04 DIAGNOSIS — E119 Type 2 diabetes mellitus without complications: Secondary | ICD-10-CM | POA: Diagnosis not present

## 2016-05-04 DIAGNOSIS — Z85038 Personal history of other malignant neoplasm of large intestine: Secondary | ICD-10-CM | POA: Insufficient documentation

## 2016-05-04 DIAGNOSIS — K589 Irritable bowel syndrome without diarrhea: Secondary | ICD-10-CM | POA: Diagnosis not present

## 2016-05-04 DIAGNOSIS — Z853 Personal history of malignant neoplasm of breast: Secondary | ICD-10-CM | POA: Insufficient documentation

## 2016-05-04 DIAGNOSIS — Z87891 Personal history of nicotine dependence: Secondary | ICD-10-CM | POA: Insufficient documentation

## 2016-05-04 DIAGNOSIS — G4730 Sleep apnea, unspecified: Secondary | ICD-10-CM | POA: Diagnosis not present

## 2016-05-04 DIAGNOSIS — K769 Liver disease, unspecified: Secondary | ICD-10-CM | POA: Diagnosis not present

## 2016-05-04 LAB — GLUCOSE, CAPILLARY
Glucose-Capillary: 108 mg/dL — ABNORMAL HIGH (ref 65–99)
Glucose-Capillary: 99 mg/dL (ref 65–99)

## 2016-05-04 LAB — CBC
HEMATOCRIT: 30.4 % — AB (ref 36.0–46.0)
Hemoglobin: 9.8 g/dL — ABNORMAL LOW (ref 12.0–15.0)
MCH: 28.7 pg (ref 26.0–34.0)
MCHC: 32.2 g/dL (ref 30.0–36.0)
MCV: 88.9 fL (ref 78.0–100.0)
Platelets: 175 10*3/uL (ref 150–400)
RBC: 3.42 MIL/uL — ABNORMAL LOW (ref 3.87–5.11)
RDW: 16.2 % — AB (ref 11.5–15.5)
WBC: 4.4 10*3/uL (ref 4.0–10.5)

## 2016-05-04 LAB — APTT: APTT: 43 s — AB (ref 24–36)

## 2016-05-04 LAB — PROTIME-INR
INR: 1.23
PROTHROMBIN TIME: 15.6 s — AB (ref 11.4–15.2)

## 2016-05-04 MED ORDER — SODIUM CHLORIDE 0.9 % IV SOLN
INTRAVENOUS | Status: DC
  Administered 2016-05-04: 12:00:00 via INTRAVENOUS

## 2016-05-04 NOTE — H&P (Signed)
Chief Complaint: liver lesion  Referring Physician: Dr. Dominica Severin B. Sherrill  Supervising Physician: [name]  Patient Status: [name] [name] - Out-pt  HPI: DOMINISHA KAMPMAN is an 75 y.o. female who is known to IR service as we just saw her in the clinic last week.  She underwent a biopsy of a liver lesion in February of 2017 which revealed no evidence of malignancy; however, a new MRI of her abdomen shows concerning features that this lesion may be HCC.  A request has been made for a repeat biopsy.  She presents today for this procedure.  Past Medical History:  Past Medical History:  Diagnosis Date  . Adenocarcinoma, breast [facility])    sees Dr. Marcy Panning   . Allergy    environmental  . Arthritis    sees Dr. Hurley Cisco   . Barrett esophagus 12/20/11  . Bladder cancer ([place])   . Cancer, hepatocellular ([place])   . Diabetes mellitus    sees Dr. Chalmers Cater   . Diverticulosis of colon (without mention of hemorrhage) 09/26/2004,09/10/2006  . Gastritis   . GERD (gastroesophageal reflux disease)   . Hemorrhoids   . Hepatic cirrhosis ([place])   . Hepatic steatosis   . Hiatal hernia   . Hyperglycemia   . Hyperlipidemia   . Hypertension   . IBS (irritable bowel syndrome)   . Inflammatory polyps of colon ([place])   . Iron deficiency anemia   . Malignant neoplasm of colon, unspecified site 07/08/2003  . Sleep apnea    sees Dr. Gwenette Greet   . Transaminase or LDH elevation    sees Dr. Zenovia Jarred     Past Surgical History:  Past Surgical History:  Procedure Laterality Date  . bladder cancer 2001    . BREAST SURGERY  2001   lymph nodes removed and rt mastectomy  . CARPAL TUNNEL RELEASE    . [place]   for diverticulitis  . COLONOSCOPY    . ESOPHAGEAL DILATION    . incisonal hernia    . mastectomy and biopsy    . sigmoid colectomy 2005     for cancer  . squamous cell CA  R  calf  2008      Family History:  Family History  Problem Relation Age of Onset  . Diabetes Mother   .  Skin cancer Mother   . Lung cancer Father   . Lymphoma Sister   . Hypertension Sister   . Diabetes Sister   . Hypertension Sister   . Breast cancer Sister   . Diabetes Sister   . Hypertension Sister   . Inflammatory bowel disease Daughter   . Esophageal cancer Son   . Stroke Neg Hx   . Colon cancer Neg Hx     Social History:  reports that she quit smoking about 41 years ago. Her smoking use included Cigarettes. She has a 40.00 pack-year smoking history. She has never used smokeless tobacco. She reports that she drinks alcohol. She reports that she uses drugs, including Methylphenidate.  Allergies: No Known Allergies  Medications: Medications reviewed in epic  Please HPI for pertinent positives, otherwise complete 10 system ROS negative.  Mallampati Score: MD Evaluation Airway: WNL Heart: WNL Abdomen: WNL Chest/ Lungs: WNL ASA  Classification: 3 Mallampati/Airway Score: Two  Physical Exam: Vitals pending, please see chart General: pleasant, WD, WN white female who is laying in bed in NAD HEENT: head is normocephalic, atraumatic.  Sclera are noninjected.  PERRL.  Ears and  nose without any masses or lesions.  Mouth is pink and moist Heart: regular, rate, and rhythm.  Normal s1,s2. No obvious murmurs, gallops, or rubs noted.  Palpable radial and pedal pulses bilaterally Lungs: CTAB, no wheezes, rhonchi, or rales noted.  Respiratory effort nonlabored Abd: soft, NT, ND, +BS, no masses, hernias, or organomegaly Psych: A&Ox3 with an appropriate affect, but easily forgetful.    Labs: Results for orders placed or performed during the hospital encounter of 05/04/16 (from the past 48 hour(s))  CBC upon arrival     Status: Abnormal   Collection Time: 05/04/16 11:29 AM  Result Value Ref Range   WBC 4.4 4.0 - 10.5 K/uL   RBC 3.42 (L) 3.87 - 5.11 MIL/uL   Hemoglobin 9.8 (L) 12.0 - 15.0 g/dL   HCT 30.4 (L) 36.0 - 46.0 %   MCV 88.9 78.0 - 100.0 fL   MCH 28.7 26.0 - 34.0 pg   MCHC  32.2 30.0 - 36.0 g/dL   RDW 16.2 (H) 11.5 - 15.5 %   Platelets 175 150 - 400 K/uL    Imaging: No results found.  Assessment/Plan 1. Liver lesion -we will plan for a repeat biopsy of one of the patient's liver lesions today to determine malignancy or not. -labs and vitals have been reviewed -Risks and Benefits discussed with the patient including, but not limited to bleeding, infection, damage to adjacent structures or low yield requiring additional tests. All of the patient's questions were answered, patient is agreeable to proceed. Consent signed and in chart.  Thank you for this interesting consult.  I greatly enjoyed meeting ZAFIRA BISCH and look forward to participating in their care.  A copy of this report was sent to the requesting provider on this date.  Electronically Signed: Henreitta Cea 05/04/2016, 11:43 AM   I spent a total of    25 Minutes in face to face in clinical consultation, greater than 50% of which was counseling/coordinating care for liver lesion

## 2016-05-04 NOTE — Discharge Instructions (Signed)
Moderate Conscious Sedation, Adult, Care After These instructions provide you with information about caring for yourself after your procedure. Your health care provider may also give you more specific instructions. Your treatment has been planned according to current medical practices, but problems sometimes occur. Call your health care provider if you have any problems or questions after your procedure. What can I expect after the procedure? After your procedure, it is common:  To feel sleepy for several hours.  To feel clumsy and have poor balance for several hours.  To have poor judgment for several hours.  To vomit if you eat too soon. Follow these instructions at home: For at least 24 hours after the procedure:   Do not:  Participate in activities where you could fall or become injured.  Drive.  Use heavy machinery.  Drink alcohol.  Take sleeping pills or medicines that cause drowsiness.  Make important decisions or sign legal documents.  Take care of children on your own.  Rest. Eating and drinking  Follow the diet recommended by your health care provider.  If you vomit:  Drink water, juice, or soup when you can drink without vomiting.  Make sure you have little or no nausea before eating solid foods. General instructions  Have a responsible adult stay with you until you are awake and alert.  Take over-the-counter and prescription medicines only as told by your health care provider.  If you smoke, do not smoke without supervision.  Keep all follow-up visits as told by your health care provider. This is important. Contact a health care provider if:  You keep feeling nauseous or you keep vomiting.  You feel light-headed.  You develop a rash.  You have a fever. Get help right away if:  You have trouble breathing. This information is not intended to replace advice given to you by your health care provider. Make sure you discuss any questions you have  with your health care provider. Document Released: 02/12/2013 Document Revised: 09/27/2015 Document Reviewed: 08/14/2015 Elsevier Interactive Patient Education  2017 [employer]. Liver Biopsy, Care After Introduction These instructions give you information on caring for yourself after your procedure. Your doctor may also give you more specific instructions. Call your doctor if you have any problems or questions after your procedure. Follow these instructions at home:  Rest at home for 1-2 days or as told by your doctor.  Have someone stay with you for at least 24 hours.  Do not do these things in the first 24 hours:  Drive.  Use machinery.  Take care of other people.  Sign legal documents.  Take a bath or shower.  There are many different ways to close and cover a cut (incision). For example, a cut can be closed with stitches, skin glue, or adhesive strips. Follow your doctor's instructions on:  Taking care of your cut.  Changing and removing your bandage (dressing).  Removing whatever was used to close your cut.  Do not drink alcohol in the first week.  Do not lift more than 5 pounds or play contact sports for the first 2 weeks.  Take medicines only as told by your doctor. For 1 week, do not take medicine that has aspirin in it or medicines like ibuprofen.  Get your test results. Contact a doctor if:  A cut bleeds and leaves more than just a small spot of blood.  A cut is red, puffs up (swells), or hurts more than before.  Fluid or something else comes from  a cut.  A cut smells bad.  You have a fever or chills. Get help right away if:  You have swelling, bloating, or pain in your belly (abdomen).  You get dizzy or faint.  You have a rash.  You feel sick to your stomach (nauseous) or throw up (vomit).  You have trouble breathing, feel short of breath, or feel faint.  Your chest hurts.  You have problems talking or seeing.  You have trouble balancing  or moving your arms or legs. This information is not intended to replace advice given to you by your health care provider. Make sure you discuss any questions you have with your health care provider. Document Released: 02/01/2008 Document Revised: 09/30/2015 Document Reviewed: 06/20/2013  2017 Elsevier

## 2016-05-04 NOTE — Sedation Documentation (Signed)
Dr. Laurence Ferrari discontinued biopsy. Follow up MRI within 3/6 months. This RN will notify SS RN of this information.

## 2016-05-04 NOTE — Procedures (Signed)
Interventional Radiology Procedure Note  Procedure: Attempted biopsy of liver lesion.  No lesion visible sonographically.   Complications: None  Estimated Blood Loss: 0  Recommendations: - DC home - F/U MRI in 3 months  Signed,  Criselda Peaches, MD

## 2016-05-11 ENCOUNTER — Other Ambulatory Visit: Payer: Self-pay | Admitting: Family Medicine

## 2016-05-11 DIAGNOSIS — Z1231 Encounter for screening mammogram for malignant neoplasm of breast: Secondary | ICD-10-CM

## 2016-05-12 ENCOUNTER — Encounter: Payer: Self-pay | Admitting: Interventional Radiology

## 2016-05-30 ENCOUNTER — Other Ambulatory Visit: Payer: Self-pay | Admitting: Internal Medicine

## 2016-05-30 ENCOUNTER — Other Ambulatory Visit: Payer: Self-pay | Admitting: Family Medicine

## 2016-06-12 ENCOUNTER — Ambulatory Visit
Admission: RE | Admit: 2016-06-12 | Discharge: 2016-06-12 | Disposition: A | Discharge status: Home / Self Care | Payer: Medicare Other | Source: Ambulatory Visit | Attending: Family Medicine | Admitting: Family Medicine

## 2016-06-12 DIAGNOSIS — Z1231 Encounter for screening mammogram for malignant neoplasm of breast: Secondary | ICD-10-CM | POA: Diagnosis not present

## 2016-06-12 HISTORY — DX: Personal history of antineoplastic chemotherapy: Z92.21

## 2016-06-12 HISTORY — DX: Personal history of irradiation: Z92.3

## 2016-06-12 HISTORY — DX: Malignant neoplasm of unspecified site of unspecified female breast: C50.919

## 2016-06-15 ENCOUNTER — Encounter: Payer: Self-pay | Admitting: Internal Medicine

## 2016-06-15 ENCOUNTER — Ambulatory Visit (INDEPENDENT_AMBULATORY_CARE_PROVIDER_SITE_OTHER): Payer: Medicare Other | Admitting: Internal Medicine

## 2016-06-15 ENCOUNTER — Other Ambulatory Visit (INDEPENDENT_AMBULATORY_CARE_PROVIDER_SITE_OTHER): Payer: Medicare Other

## 2016-06-15 ENCOUNTER — Ambulatory Visit: Payer: Medicare Other | Admitting: Internal Medicine

## 2016-06-15 VITALS — BP 134/78 | HR 72 | Ht 63.75 in | Wt 165.4 lb

## 2016-06-15 DIAGNOSIS — F32A Depression, unspecified: Secondary | ICD-10-CM

## 2016-06-15 DIAGNOSIS — D649 Anemia, unspecified: Secondary | ICD-10-CM

## 2016-06-15 DIAGNOSIS — C220 Liver cell carcinoma: Secondary | ICD-10-CM | POA: Diagnosis not present

## 2016-06-15 DIAGNOSIS — R110 Nausea: Secondary | ICD-10-CM

## 2016-06-15 DIAGNOSIS — K7581 Nonalcoholic steatohepatitis (NASH): Secondary | ICD-10-CM | POA: Diagnosis not present

## 2016-06-15 DIAGNOSIS — K649 Unspecified hemorrhoids: Secondary | ICD-10-CM | POA: Diagnosis not present

## 2016-06-15 DIAGNOSIS — K7460 Unspecified cirrhosis of liver: Secondary | ICD-10-CM

## 2016-06-15 DIAGNOSIS — K529 Noninfective gastroenteritis and colitis, unspecified: Secondary | ICD-10-CM

## 2016-06-15 DIAGNOSIS — F329 Major depressive disorder, single episode, unspecified: Secondary | ICD-10-CM

## 2016-06-15 DIAGNOSIS — F0391 Unspecified dementia with behavioral disturbance: Secondary | ICD-10-CM

## 2016-06-15 LAB — CBC WITH DIFFERENTIAL/PLATELET
BASOS ABS: 0.1 10*3/uL (ref 0.0–0.1)
Basophils Relative: 0.9 % (ref 0.0–3.0)
Eosinophils Absolute: 0.1 10*3/uL (ref 0.0–0.7)
Eosinophils Relative: 1.6 % (ref 0.0–5.0)
HEMATOCRIT: 30.5 % — AB (ref 36.0–46.0)
Hemoglobin: 10 g/dL — ABNORMAL LOW (ref 12.0–15.0)
LYMPHS ABS: 1.4 10*3/uL (ref 0.7–4.0)
LYMPHS PCT: 23.2 % (ref 12.0–46.0)
MCHC: 32.7 g/dL (ref 30.0–36.0)
MCV: 85.8 fl (ref 78.0–100.0)
MONOS PCT: 12.4 % — AB (ref 3.0–12.0)
Monocytes Absolute: 0.7 10*3/uL (ref 0.1–1.0)
NEUTROS PCT: 61.9 % (ref 43.0–77.0)
Neutro Abs: 3.6 10*3/uL (ref 1.4–7.7)
Platelets: 183 10*3/uL (ref 150.0–400.0)
RBC: 3.56 Mil/uL — AB (ref 3.87–5.11)
RDW: 17.1 % — ABNORMAL HIGH (ref 11.5–15.5)
WBC: 5.8 10*3/uL (ref 4.0–10.5)

## 2016-06-15 LAB — COMPREHENSIVE METABOLIC PANEL
ALT: 19 U/L (ref 0–35)
AST: 33 U/L (ref 0–37)
Albumin: 3.7 g/dL (ref 3.5–5.2)
Alkaline Phosphatase: 121 U/L — ABNORMAL HIGH (ref 39–117)
BILIRUBIN TOTAL: 0.9 mg/dL (ref 0.2–1.2)
BUN: 11 mg/dL (ref 6–23)
CO2: 30 mEq/L (ref 19–32)
CREATININE: 0.86 mg/dL (ref 0.40–1.20)
Calcium: 9.5 mg/dL (ref 8.4–10.5)
Chloride: 101 mEq/L (ref 96–112)
GFR: 68.3 mL/min (ref >60.00)
GLUCOSE: 143 mg/dL — AB (ref 70–99)
Potassium: 3.7 mEq/L (ref 3.5–5.1)
Sodium: 141 mEq/L (ref 135–145)
TOTAL PROTEIN: 7.4 g/dL (ref 6.0–8.3)

## 2016-06-15 LAB — PROTIME-INR
INR: 1.3 ratio — ABNORMAL HIGH (ref 0.8–1.0)
Prothrombin Time: 13.8 s — ABNORMAL HIGH (ref 9.6–13.1)

## 2016-06-15 LAB — FERRITIN: FERRITIN: 13.3 ng/mL (ref 10.0–291.0)

## 2016-06-15 MED ORDER — ONDANSETRON 4 MG PO TBDP: ORAL_TABLET | ORAL | 1 refills | Status: DC

## 2016-06-15 MED ORDER — DIPHENOXYLATE-ATROPINE 2.5-0.025 MG PO TABS: 2.0000 | ORAL_TABLET | Freq: Four times a day (QID) | ORAL | 1 refills | Status: DC | PRN

## 2016-06-15 NOTE — Patient Instructions (Addendum)
We have sent the following medications to your pharmacy for you to pick up at your convenience: Lomotil 2 tablets by mouth four times daily as needed (increased dosage) Zofran as needed for nausea  Continue prilosec 20 mg twice daily.  Call Dr Samuel Bouche office to follow up with IR regardding [facility] liver tumor. If you cannot get an appointment, call our office.  Your physician has requested that you go to the basement for the following lab work before leaving today: CBC, CMP, INR, Ferritin, AFP  Please follow up with Dr Hilarie Fredrickson in 6 months.  If you are age 62 or older, your body mass index should be between 23-30. Your Body mass index is 28.61 kg/m. If this is out of the aforementioned range listed, please consider follow up with your Primary Care Provider.  If you are age 65 or younger, your body mass index should be between 19-25. Your Body mass index is 28.61 kg/m. If this is out of the aformentioned range listed, please consider follow up with your Primary Care Provider.

## 2016-06-16 LAB — AFP TUMOR MARKER: AFP-Tumor Marker: 5.5 ng/mL (ref <6.1)

## 2016-06-16 NOTE — Progress Notes (Signed)
Subjective:    Patient ID: [name] [name], female    DOB: May 27, 1940, 76 y.o.   MRN: [id number]  HPI [name] [name] is a 76 year old female with NASH cirrhosis, liver lesion concerning for [facility], right-sided colon cancer status post ileocecectomy, history of breast cancer, history of bladder cancer, history of diverticulitis status post partial colectomy, history of GERD with Barrett's esophagus and history of gastric polyps is here for follow-up. She was last seen in June 2017. She is here today with her daughter.  Since her last visit she has been formally diagnosed with dementia. She is taking Aricept and the Namenda. Her daughter notes that she remains forgetful but denies sleepiness, agitation. No overt signs of hepatic encephalopathy. She was previously taking rifaximin but due to its cost prohibitive nature she is now off.  She continues to struggle with chronic diarrhea and is using Lomotil about 3 times per day. She calls this her "miracle pill" but even with this dose she is having 2-4 loose stools a day. She has seen blood with wiping on occasion. No hematochezia or melena. Stools can be urgent and she reports having accidents and not reaching the bathroom in time both for urine and stool.  She was seen by Dr. Laurence Ferrari with interventional radiology. She is supposed to follow with him fairly soon but is unaware of her appointment date. No treatment to this point. They had previously considered ablative therapy if this lesion grows quickly. --Of note in the past she had liver biopsy reviewed at [facility] and these were felt to be regenerative nodules with no evidence of malignancy. However imaging characteristics changed and one lesion appears to be a probable HCC  Her nausea is much improved and she is no longer having the dry heaves. She can have mild nausea which seems to respond well to Zofran.  Her family feels like overall her mood is better on Zoloft.  She has continued omeprazole 20 mg  twice daily.  She denies lower extremity swelling, increasing abdominal girth, jaundice, bleeding, confusion and itching.   Review of Systems As per history of present illness, otherwise negative  Current Medications, Allergies, Past Medical History, Past Surgical History, Family History and Social History were reviewed in [employer] record.     Objective:   Physical Exam BP 134/78 (BP Location: Left Arm, Patient Position: Sitting, Cuff Size: Normal)   Pulse 72   Ht 5' 3.75" (1.619 m)   Wt 165 lb 6 oz (75 kg)   BMI 28.61 kg/m  Constitutional: Well-developed and well-nourished. No distress. HEENT: Normocephalic and atraumatic. Oropharynx is clear and moist. No oropharyngeal exudate. Conjunctivae are normal.  No scleral icterus. Neck: Neck supple. Trachea midline. Cardiovascular: Normal rate, regular rhythm and intact distal pulses. No M/R/G Pulmonary/chest: Effort normal and breath sounds normal. No wheezing, rales or rhonchi. Abdominal: Soft, obese, nontender, nondistended. Bowel sounds active throughout.  Extremities: no clubbing, cyanosis, or edema Neurological: Alert and oriented to person place and time. No asterixis Skin: Skin is warm and dry. No rashes noted. Psychiatric: Normal mood and affect. Behavior is normal.  CBC    Component Value Date/Time   WBC 5.8 06/15/2016 1201   RBC 3.56 (L) 06/15/2016 1201   HGB 10.0 (L) 06/15/2016 1201   HGB 11.2 (L) 09/07/2014 1337   HCT 30.5 (L) 06/15/2016 1201   HCT 33.9 (L) 09/07/2014 1337   PLT 183.0 06/15/2016 1201   PLT 163 09/07/2014 1337   MCV 85.8 06/15/2016 1201  MCV 94.4 09/07/2014 1337   MCH 28.7 05/04/2016 1129   MCHC 32.7 06/15/2016 1201   RDW 17.1 (H) 06/15/2016 1201   RDW 16.2 (H) 09/07/2014 1337   LYMPHSABS 1.4 06/15/2016 1201   LYMPHSABS 1.9 09/07/2014 1337   MONOABS 0.7 06/15/2016 1201   MONOABS 0.8 09/07/2014 1337   EOSABS 0.1 06/15/2016 1201   EOSABS 0.1 09/07/2014 1337   EOSABS  0.2 01/24/2007 1456   BASOSABS 0.1 06/15/2016 1201   BASOSABS 0.0 09/07/2014 1337   CMP     Component Value Date/Time   NA 141 06/15/2016 1201   NA 141 09/07/2014 1337   K 3.7 06/15/2016 1201   K 3.8 09/07/2014 1337   CL 101 06/15/2016 1201   CL 99 01/18/2006 1426   CO2 30 06/15/2016 1201   CO2 20 (L) 09/07/2014 1337   GLUCOSE 143 (H) 06/15/2016 1201   GLUCOSE 116 09/07/2014 1337   GLUCOSE 130 (H) 01/18/2006 1426   BUN 11 06/15/2016 1201   BUN 14.2 09/07/2014 1337   CREATININE 0.86 06/15/2016 1201   CREATININE 0.8 09/07/2014 1337   CALCIUM 9.5 06/15/2016 1201   CALCIUM 9.6 09/07/2014 1337   PROT 7.4 06/15/2016 1201   PROT 7.1 09/07/2014 1337   ALBUMIN 3.7 06/15/2016 1201   ALBUMIN 3.2 (L) 09/07/2014 1337   AST 33 06/15/2016 1201   AST 37 (H) 09/07/2014 1337   ALT 19 06/15/2016 1201   ALT 25 09/07/2014 1337   ALKPHOS 121 (H) 06/15/2016 1201   ALKPHOS 113 09/07/2014 1337   BILITOT 0.9 06/15/2016 1201   BILITOT 0.93 09/07/2014 1337   GFRNONAA >60 04/11/2016 1457   GFRAA >60 04/11/2016 1457       Assessment & Plan:   76 year old female with NASH cirrhosis, liver lesion concerning for [place], right-sided colon cancer status post ileocecectomy, history of breast cancer, history of bladder cancer, history of diverticulitis status post partial colectomy, history of GERD with Barrett's esophagus and history of gastric polyps is here for follow-up.   1. NASH cirrhosis -- no overt evidence of portal hypertension or complication thereof. She is having chronic diarrhea and no overt signs of hepatic encephalopathy. For this reason I'm not starting lactulose. Rifaximin has been caused prohibitive. --Continue low-sodium diet --See #2 for [facility] screening --Variceal screening endoscopy up-to-date, no varices as of March 2017 --Continue low-sodium diet  2. Liver lesion/probable HCC -- I recommended that she contact Dr. Katrinka Blazing office for follow-up appointment. I expect surveillance  imaging and then a decision regarding possible ablation.  3. Hemorrhoids -- medium to large at time of colonoscopy. Likely source for intermittent low volume rectal bleeding. Colonoscopy otherwise unremarkable 11 months ago. Preparation H OTC  4. Chronic diarrhea -- increase Lomotil to 2 tablets 4 times daily as needed to help control loose stools. Improving loose stools also likely improve hemorrhoidal symptoms  5. Nausea with dry heaving -- improved. Query if removing hyperplastic gastric polyp improve this symptom. Continue Zofran on an as-needed basis  6. Gastric polyp -- hyperplastic without dysplasia. Resected 11 months ago.  7. Anemia -- stable, mild. No evidence of iron deficiency at this time. Monitor closely  8. 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  Will be followed with imaging necessary for #2   9. Depressed mood -- improved with Zoloft. Continue 50 mg daily  10. Dementia -- being followed by primary care and neurology  40 minutes spent with the patient today. Greater than 50% was spent in counseling and coordination of care  with the patient

## 2016-06-20 ENCOUNTER — Other Ambulatory Visit: Payer: Self-pay | Admitting: *Deleted

## 2016-06-20 ENCOUNTER — Other Ambulatory Visit (HOSPITAL_COMMUNITY): Payer: Self-pay | Admitting: Interventional Radiology

## 2016-06-20 DIAGNOSIS — K769 Liver disease, unspecified: Secondary | ICD-10-CM

## 2016-06-21 ENCOUNTER — Other Ambulatory Visit: Payer: Self-pay | Admitting: Internal Medicine

## 2016-06-23 ENCOUNTER — Ambulatory Visit (INDEPENDENT_AMBULATORY_CARE_PROVIDER_SITE_OTHER): Payer: Medicare Other | Admitting: Adult Health

## 2016-06-23 ENCOUNTER — Encounter: Payer: Self-pay | Admitting: Adult Health

## 2016-06-23 DIAGNOSIS — G4733 Obstructive sleep apnea (adult) (pediatric): Secondary | ICD-10-CM | POA: Diagnosis not present

## 2016-06-23 NOTE — Addendum Note (Signed)
Addended by: Parke Poisson E on: 06/23/2016 04:11 PM   Modules accepted: Orders

## 2016-06-23 NOTE — Assessment & Plan Note (Signed)
Controlled on CPAP  SD card ordered  Supply rx sent   Plan  Patient Instructions  Continue on CPAP At bedtime   Do not drive if sleepy .  Order for new mask.  Follow up Dr. Elsworth Soho  In 1 year and As needed

## 2016-06-23 NOTE — Patient Instructions (Signed)
Continue on CPAP At bedtime   Do not drive if sleepy .  Order for new mask.  Follow up Dr. Elsworth Soho  In 1 year and As needed

## 2016-06-23 NOTE — Progress Notes (Signed)
@Patient  ID: [name] [name], female    DOB: [birth year], 76 y.o.   MRN: [id number]  Chief Complaint  Patient presents with  . Follow-up    OSA     Referring provider: [name], MD  HPI: 76 yo female followed for OSA   TEST  HST 02/2013:  AHI 6/hr. ; Dr Cyndi Bender 05/2013:  Optimal pressure 13cm, no significant leaks, good control of AHI   06/23/2016 follow-up sleep apnea Patient returns for a one-year follow-up for her sleep apnea. Patient says that she is doing well on CPAP at at bedtime. She is wearing every single night. Pt feels rested with significant daytime sleepiness.  No download available.     No Known Allergies  Immunization History  Administered Date(s) Administered  . Influenza Split 05/09/2012  . Influenza, High Dose Seasonal PF 05/06/2013  . Influenza,inj,Quad PF,36+ Mos 03/23/2014  . Influenza-Unspecified 03/18/2015, 03/20/2016  . Pneumococcal Polysaccharide-23 12/12/2012    Past Medical History:  Diagnosis Date  . Adenocarcinoma, breast [facility])    sees Dr. Marcy Panning   . Allergy    environmental  . Arthritis    sees Dr. Hurley Cisco   . Barrett esophagus 12/20/11  . Bladder cancer ([place])   . Breast cancer ([place])   . Cancer, hepatocellular ([place])   . Dementia    mild  . Diabetes mellitus    sees Dr. Chalmers Cater   . Diverticulosis of colon (without mention of hemorrhage) 09/26/2004,09/10/2006  . Gastritis   . GERD (gastroesophageal reflux disease)   . Hemorrhoids   . Hepatic cirrhosis ([place])   . Hepatic steatosis   . Hiatal hernia   . Hyperglycemia   . Hyperlipidemia   . Hypertension   . IBS (irritable bowel syndrome)   . Inflammatory polyps of colon ([place])   . Iron deficiency anemia   . Malignant neoplasm of colon, unspecified site 07/08/2003  . Personal history of chemotherapy   . Personal history of radiation therapy   . Sleep apnea    sees Dr. Gwenette Greet   . Transaminase or LDH elevation    sees Dr. Zenovia Jarred     Tobacco  History: History  Smoking Status  . Former Smoker  . Packs/day: 2.00  . Years: 20.00  . Types: Cigarettes  . Quit date: 05/09/1975  Smokeless Tobacco  . Never Used    Comment: smoked 1958-1977, up to 2 ppd   Counseling given: Not Answered   Outpatient Encounter Prescriptions as of 06/23/2016  Medication Sig  . aspirin EC 81 MG tablet Take 81 mg by mouth at bedtime.  . Calcium Carbonate-Vitamin D (CALCIUM + D PO) Take 1 tablet by mouth daily.  . diphenoxylate-atropine (LOMOTIL) 2.5-0.025 MG tablet Take 2 tablets by mouth 4 (four) times daily as needed for diarrhea or loose stools.  . donepezil (ARICEPT) 10 MG tablet TAKE 1 TABLET AT BEDTIME  . fish oil-omega-3 fatty acids 1000 MG capsule Take 1 g by mouth daily.   . furosemide (LASIX) 40 MG tablet Take 1 tablet (40 mg total) by mouth daily.  Marland Kitchen glucose blood test strip 1 each by Other route 2 (two) times daily. One Touch  . Insulin Isophane Human (NOVOLIN N RELION ) Inject 30-40 Units into the skin 2 (two) times daily. 30units Q QM, 40 units QPM  . meclizine (ANTIVERT) 25 MG tablet Take 1 tablet (25 mg total) by mouth every 4 (four) hours as needed for dizziness.  . memantine (NAMENDA) 10 MG tablet  Take 1 tablet (10 mg total) by mouth 2 (two) times daily.  . metFORMIN (GLUCOPHAGE-XR) 500 MG 24 hr tablet Take 1,000 mg by mouth 2 (two) times daily.  . metoprolol tartrate (LOPRESSOR) 25 MG tablet TAKE 1 TABLET TWICE DAILY  . nitroGLYCERIN (NITRODUR - DOSED IN MG/24 HR) 0.2 mg/hr patch Reported on 08/23/2015  . omeprazole (PRILOSEC) 20 MG capsule TAKE 1 CAPSULE TWICE DAILY BEFORE MEALS  . ondansetron (ZOFRAN-ODT) 4 MG disintegrating tablet DISSOLVE ONE TABLET IN MOUTH EVERY 8 HOURS AS NEEDED FOR NAUSEA OR VOMITING  . potassium chloride SA (K-DUR,KLOR-CON) 20 MEQ tablet Take 1 tablet (20 mEq total) by mouth daily.  Marland Kitchen RELION INSULIN SYRINGE 1ML/31G 31G X 5/16" 1 ML MISC   . rosuvastatin (CRESTOR) 10 MG tablet TAKE 1/2 TABLET EVERY DAY  .  sertraline (ZOLOFT) 25 MG tablet TAKE ONE TABLET BY MOUTH EVERY NIGHT FOR 7 TO 10 DAYS, THEN INCREASE TO TWO TABLETS EVERY NIGHT THEREAFTER  . traMADol (ULTRAM) 50 MG tablet Take 1 tablet (50 mg total) by mouth every 6 (six) hours as needed.  . [DISCONTINUED] omeprazole (PRILOSEC) 20 MG capsule Take 1 capsule (20 mg total) by mouth 2 (two) times daily before a meal.   No facility-administered encounter medications on file as of 06/23/2016.      Review of Systems  Constitutional:   No  weight loss, night sweats,  Fevers, chills, fatigue, or  lassitude.  HEENT:   No headaches,  Difficulty swallowing,  Tooth/dental problems, or  Sore throat,                No sneezing, itching, ear ache, nasal congestion, post nasal drip,   CV:  No chest pain,  Orthopnea, PND, swelling in lower extremities, anasarca, dizziness, palpitations, syncope.   GI  No heartburn, indigestion, abdominal pain, nausea, vomiting, diarrhea, change in bowel habits, loss of appetite, bloody stools.   Resp: No shortness of breath with exertion or at rest.  No excess mucus, no productive cough,  No non-productive cough,  No coughing up of blood.  No change in color of mucus.  No wheezing.  No chest wall deformity  Skin: no rash or lesions.  GU: no dysuria, change in color of urine, no urgency or frequency.  No flank pain, no hematuria   MS:  No joint pain or swelling.  No decreased range of motion.  No back pain.    Physical Exam  BP (!) 102/58 (BP Location: Left Arm, Patient Position: Sitting, Cuff Size: Normal)   Pulse 84   Ht 5\' 5"  (1.651 m)   Wt 164 lb 6.4 oz (74.6 kg)   SpO2 97%   BMI 27.36 kg/m   GEN: A/Ox3; pleasant , NAD, eldelry    HEENT:  [place]/AT,  EACs-clear, TMs-wnl, NOSE-clear, THROAT-clear, no lesions, no postnasal drip or exudate noted. Class 2 MP airway   NECK:  Supple w/ fair ROM; no JVD; normal carotid impulses w/o bruits; no thyromegaly or nodules palpated; no lymphadenopathy.    RESP  Clear  P  & A; w/o, wheezes/ rales/ or rhonchi. no accessory muscle use, no dullness to percussion  CARD:  RRR, no m/r/g, no peripheral edema, pulses intact, no cyanosis or clubbing.  GI:   Soft & nt; nml bowel sounds; no organomegaly or masses detected.   Musco: Warm bil, no deformities or joint swelling noted.   Neuro: alert, no focal deficits noted.    Skin: Warm, no lesions or rashes    Lab  Results:  CBC BMET   BNP No results found for: BNP  ProBNP    Component Value Date/Time   PROBNP 96.0 07/15/2013 1632    Imaging: Mm Screening Breast Tomo Uni L  Result Date: 06/12/2016 CLINICAL DATA:  Screening. EXAM: 2D DIGITAL SCREENING UNILATERAL LEFT MAMMOGRAM WITH CAD AND ADJUNCT TOMO COMPARISON:  Previous exam(s). ACR Breast Density Category b: There are scattered areas of fibroglandular density. FINDINGS: There are no findings suspicious for malignancy. Images were processed with CAD. IMPRESSION: No mammographic evidence of malignancy. A result letter of this screening mammogram will be mailed directly to the patient. RECOMMENDATION: Screening mammogram in one year. (Code:SM-B-01Y) BI-RADS CATEGORY  1: Negative. Electronically Signed   By: Lovey Newcomer M.D.   On: 06/12/2016 16:48     Assessment & Plan:   OSA (obstructive sleep apnea)- C-pap Controlled on CPAP  SD card ordered  Supply rx sent   Plan  Patient Instructions  Continue on CPAP At bedtime   Do not drive if sleepy .  Order for new mask.  Follow up Dr. Elsworth Soho  In 1 year and As needed         Rexene Edison, NP 06/23/2016

## 2016-06-28 NOTE — Progress Notes (Signed)
Reviewed & agree with plan  

## 2016-07-04 ENCOUNTER — Emergency Department (HOSPITAL_COMMUNITY)
Admission: EM | Admit: 2016-07-04 | Discharge: 2016-07-04 | Disposition: A | Discharge status: Home / Self Care | Payer: Medicare Other | Attending: Emergency Medicine | Admitting: Emergency Medicine

## 2016-07-04 ENCOUNTER — Telehealth: Payer: Self-pay

## 2016-07-04 ENCOUNTER — Encounter (HOSPITAL_COMMUNITY): Payer: Self-pay | Admitting: Emergency Medicine

## 2016-07-04 ENCOUNTER — Emergency Department (HOSPITAL_COMMUNITY): Payer: Medicare Other

## 2016-07-04 DIAGNOSIS — Z87891 Personal history of nicotine dependence: Secondary | ICD-10-CM | POA: Insufficient documentation

## 2016-07-04 DIAGNOSIS — Z7982 Long term (current) use of aspirin: Secondary | ICD-10-CM | POA: Diagnosis not present

## 2016-07-04 DIAGNOSIS — Z794 Long term (current) use of insulin: Secondary | ICD-10-CM | POA: Insufficient documentation

## 2016-07-04 DIAGNOSIS — Z853 Personal history of malignant neoplasm of breast: Secondary | ICD-10-CM | POA: Insufficient documentation

## 2016-07-04 DIAGNOSIS — R2689 Other abnormalities of gait and mobility: Secondary | ICD-10-CM | POA: Diagnosis not present

## 2016-07-04 DIAGNOSIS — E119 Type 2 diabetes mellitus without complications: Secondary | ICD-10-CM | POA: Insufficient documentation

## 2016-07-04 DIAGNOSIS — I110 Hypertensive heart disease with heart failure: Secondary | ICD-10-CM | POA: Insufficient documentation

## 2016-07-04 DIAGNOSIS — Z8551 Personal history of malignant neoplasm of bladder: Secondary | ICD-10-CM | POA: Insufficient documentation

## 2016-07-04 DIAGNOSIS — R410 Disorientation, unspecified: Secondary | ICD-10-CM | POA: Diagnosis not present

## 2016-07-04 DIAGNOSIS — I5033 Acute on chronic diastolic (congestive) heart failure: Secondary | ICD-10-CM | POA: Diagnosis not present

## 2016-07-04 DIAGNOSIS — R2681 Unsteadiness on feet: Secondary | ICD-10-CM | POA: Diagnosis not present

## 2016-07-04 DIAGNOSIS — R269 Unspecified abnormalities of gait and mobility: Secondary | ICD-10-CM

## 2016-07-04 DIAGNOSIS — D649 Anemia, unspecified: Secondary | ICD-10-CM

## 2016-07-04 LAB — DIFFERENTIAL
BASOS ABS: 0 10*3/uL (ref 0.0–0.1)
BASOS PCT: 0 %
EOS ABS: 0.1 10*3/uL (ref 0.0–0.7)
Eosinophils Relative: 2 %
Lymphocytes Relative: 34 %
Lymphs Abs: 1.5 10*3/uL (ref 0.7–4.0)
Monocytes Absolute: 0.5 10*3/uL (ref 0.1–1.0)
Monocytes Relative: 12 %
NEUTROS PCT: 52 %
Neutro Abs: 2.3 10*3/uL (ref 1.7–7.7)

## 2016-07-04 LAB — APTT: aPTT: 36 seconds (ref 24–36)

## 2016-07-04 LAB — I-STAT TROPONIN, ED: TROPONIN I, POC: 0.03 ng/mL (ref 0.00–0.08)

## 2016-07-04 LAB — CBC
HCT: 29 % — ABNORMAL LOW (ref 36.0–46.0)
Hemoglobin: 9.1 g/dL — ABNORMAL LOW (ref 12.0–15.0)
MCH: 26.8 pg (ref 26.0–34.0)
MCHC: 31.4 g/dL (ref 30.0–36.0)
MCV: 85.5 fL (ref 78.0–100.0)
PLATELETS: UNDETERMINED 10*3/uL (ref 150–400)
RBC: 3.39 MIL/uL — ABNORMAL LOW (ref 3.87–5.11)
RDW: 17.3 % — AB (ref 11.5–15.5)
WBC: 4.3 10*3/uL (ref 4.0–10.5)

## 2016-07-04 LAB — COMPREHENSIVE METABOLIC PANEL
ALBUMIN: 3.4 g/dL — AB (ref 3.5–5.0)
ALT: 19 U/L (ref 14–54)
AST: 43 U/L — AB (ref 15–41)
Alkaline Phosphatase: 102 U/L (ref 38–126)
Anion gap: 14 (ref 5–15)
BUN: 13 mg/dL (ref 6–20)
CHLORIDE: 102 mmol/L (ref 101–111)
CO2: 23 mmol/L (ref 22–32)
Calcium: 9 mg/dL (ref 8.9–10.3)
Creatinine, Ser: 1.09 mg/dL — ABNORMAL HIGH (ref 0.44–1.00)
GFR calc Af Amer: 56 mL/min — ABNORMAL LOW (ref >60)
GFR, EST NON AFRICAN AMERICAN: 48 mL/min — AB (ref >60)
Glucose, Bld: 90 mg/dL (ref 65–99)
POTASSIUM: 3.3 mmol/L — AB (ref 3.5–5.1)
SODIUM: 139 mmol/L (ref 135–145)
Total Bilirubin: 0.8 mg/dL (ref 0.3–1.2)
Total Protein: 7.1 g/dL (ref 6.5–8.1)

## 2016-07-04 LAB — URINALYSIS, ROUTINE W REFLEX MICROSCOPIC
BILIRUBIN URINE: NEGATIVE
Glucose, UA: NEGATIVE mg/dL
Hgb urine dipstick: NEGATIVE
KETONES UR: NEGATIVE mg/dL
Leukocytes, UA: NEGATIVE
NITRITE: NEGATIVE
Protein, ur: NEGATIVE mg/dL
SPECIFIC GRAVITY, URINE: 1.01 (ref 1.005–1.030)
pH: 5 (ref 5.0–8.0)

## 2016-07-04 LAB — PROTIME-INR
INR: 1.22
PROTHROMBIN TIME: 15.5 s — AB (ref 11.4–15.2)

## 2016-07-04 LAB — CBG MONITORING, ED: Glucose-Capillary: 75 mg/dL (ref 65–99)

## 2016-07-04 MED ORDER — POTASSIUM CHLORIDE CRYS ER 20 MEQ PO TBCR
40.0000 meq | EXTENDED_RELEASE_TABLET | Freq: Once | ORAL | Status: AC
  Administered 2016-07-04: 40 meq via ORAL
  Filled 2016-07-04: qty 2

## 2016-07-04 MED ORDER — GADOBENATE DIMEGLUMINE 529 MG/ML IV SOLN
15.0000 mL | Freq: Once | INTRAVENOUS | Status: AC | PRN
  Administered 2016-07-04: 15 mL via INTRAVENOUS

## 2016-07-04 NOTE — ED Triage Notes (Signed)
Pt brought in because she is leaning to her left even when walking.  Patient had confusion at grocery store. Patient didn't feel well this past weekend while out of town with family.  Family unsure when last seen normal.

## 2016-07-04 NOTE — ED Notes (Signed)
Pt transferred to MRI before I could draw the remaining blood. Will get samples upon her return.

## 2016-07-04 NOTE — ED Notes (Signed)
MD at bedside. 

## 2016-07-04 NOTE — Discharge Instructions (Signed)
Stop taking sertraline (Zoloft). It may be causing your problems with balance. If your balance does not improve after two weeks off the sertraline, then the cause is something else.

## 2016-07-04 NOTE — Telephone Encounter (Signed)
Spoke with pt's husband and he states that pt has started "leaning to the left" and "not walking right". He states she denies any slurred speech or speech problems, facial drooping, headache, n/v, chest pain or LOC. He checked bilateral grip and both seemed normal. He reports pt had similar episode several months ago.  Spoke with Dr. Sarajane Jews and he recommends immediate ED evaluation for possible stroke. Advised pt's husband that pt needs immediate transport to ED for evaluation and he agrees. He states that he is able to get pt to ED now. Advised husband to call EMS if any immediate changes in pt condition.

## 2016-07-04 NOTE — ED Notes (Signed)
Pt ambulated to the restroom with one assist

## 2016-07-04 NOTE — ED Notes (Signed)
Pt and family report that pt is favoring her left side and leaning that way when ambulating. They are unsure when it started.

## 2016-07-04 NOTE — ED Provider Notes (Signed)
[facility] DEPT Provider Note   CSN: [id number] Arrival date & time: 07/04/16  1606     History   Chief Complaint Chief Complaint  Patient presents with  . trouble walking  . more confused    HPI [name] [name] is a 76 y.o. female.  She has been having intermittent problems with unsteady gait and leaning to the left when she sits or walks. Symptoms of been waxing and waning for approximately 8 months. She states she did find was having similar difficulty yesterday, and is not sure going back further than that. She was seen in emergency department 2 months ago without a cause being found. She is followed by a neurologist, but the neurologist is evaluating her for dementia and has not been made aware of the balance issue. She has not actually fallen but has had near-falls. She denies headache or dizziness. There is no nausea or vomiting. She denies any pain anywhere. Her son states that this has been going on for approximately 6-8 months.   The history is provided by the patient and a relative.    Past Medical History:  Diagnosis Date  . Adenocarcinoma, breast [facility])    sees Dr. Marcy Panning   . Allergy    environmental  . Arthritis    sees Dr. Hurley Cisco   . Barrett esophagus 12/20/11  . Bladder cancer ([place])   . Breast cancer ([place])   . Cancer, hepatocellular ([place])   . Dementia    mild  . Diabetes mellitus    sees Dr. Chalmers Cater   . Diverticulosis of colon (without mention of hemorrhage) 09/26/2004,09/10/2006  . Gastritis   . GERD (gastroesophageal reflux disease)   . Hemorrhoids   . Hepatic cirrhosis ([place])   . Hepatic steatosis   . Hiatal hernia   . Hyperglycemia   . Hyperlipidemia   . Hypertension   . IBS (irritable bowel syndrome)   . Inflammatory polyps of colon ([place])   . Iron deficiency anemia   . Malignant neoplasm of colon, unspecified site 07/08/2003  . Personal history of chemotherapy   . Personal history of radiation therapy   . Sleep apnea    sees  Dr. Gwenette Greet   . Transaminase or LDH elevation    sees Dr. Zenovia Jarred     Patient Active Problem List   Diagnosis Date Noted  . Mild dementia 03/28/2016  . Mild cognitive impairment 12/22/2015  . Memory loss or impairment 06/22/2015  . Nausea with vomiting 02/07/2014  . Fatty liver 07/28/2013  . Barrett's esophagus 07/28/2013  . Acute on chronic diastolic HF (heart failure) ([place]) 07/10/2013  . Chest pain 07/08/2013  . Dyspnea on exertion 07/07/2013  . Exertional angina ([place]) 07/07/2013  . CHF (congestive heart failure) ([place]) 07/07/2013  . Lumbar radiculopathy 05/06/2013  . Anemia, unspecified 05/06/2013  . OSA (obstructive sleep apnea)- C-pap 02/04/2013  . Arthralgia of multiple joints 12/13/2012  . DIZZINESS 05/25/2010  . POSTHERPETIC NEURALGIA 10/26/2009  . GERD 05/18/2008  . HIATAL HERNIA 05/18/2008  . DIVERTICULITIS, HX OF 05/18/2008  . Essential hypertension 05/11/2008  . COLON CANCER, HX OF 05/11/2008  . SKIN CANCER, HX OF 05/11/2008  . Malignant neoplasm of central portion of right female breast ([place]) 04/25/2007  . BLADDER CANCER , UNSPEC. 04/25/2007  . Diabetes mellitus type 2 in obese ([place]) 04/25/2007  . HYPERLIPIDEMIA 04/25/2007  . DIVERTICULOSIS, COLON 08/13/2006  . IBS 08/13/2006  . ELEVATION, TRANSAMINASE/LDH LEVELS 08/13/2006    Past Surgical History:  Procedure Laterality  Date  . bladder cancer 2001    . BREAST BIOPSY    . BREAST SURGERY  2001   lymph nodes removed and rt mastectomy  . CARPAL TUNNEL RELEASE    . [place]   for diverticulitis  . COLONOSCOPY    . ESOPHAGEAL DILATION    . incisonal hernia    . IR GENERIC HISTORICAL  04/25/2016   IR RADIOLOGIST EVAL & MGMT 04/25/2016 [name], MD [facility] INTERV RAD  . MASTECTOMY    . mastectomy and biopsy    . sigmoid colectomy 2005     for cancer  . squamous cell CA  [facility]  [place]  2008      OB History    No data available       Home Medications    Prior to Admission  medications   Medication Sig Start Date End Date Taking? Authorizing Provider  aspirin EC 81 MG tablet Take 81 mg by mouth at bedtime.    Historical Provider, MD  Calcium Carbonate-Vitamin D (CALCIUM + D PO) Take 1 tablet by mouth daily.    Historical Provider, MD  diphenoxylate-atropine (LOMOTIL) 2.5-0.025 MG tablet Take 2 tablets by mouth 4 (four) times daily as needed for diarrhea or loose stools. 06/15/16   Jerene Bears, MD  donepezil (ARICEPT) 10 MG tablet TAKE 1 TABLET AT BEDTIME 05/31/16   [name], MD  fish oil-omega-3 fatty acids 1000 MG capsule Take 1 g by mouth daily.     Historical Provider, MD  furosemide (LASIX) 40 MG tablet Take 1 tablet (40 mg total) by mouth daily. 07/30/15   Lorretta Harp, MD  Insulin Isophane Human (NOVOLIN N RELION [place]) Inject 30-40 Units into the skin 2 (two) times daily. 30units Q QM, 40 units QPM    Historical Provider, MD  meclizine (ANTIVERT) 25 MG tablet Take 1 tablet (25 mg total) by mouth every 4 (four) hours as needed for dizziness. 05/13/14   [name], MD  memantine (NAMENDA) 10 MG tablet Take 1 tablet (10 mg total) by mouth 2 (two) times daily. 06/23/15   [name], MD  metFORMIN (GLUCOPHAGE-XR) 500 MG 24 hr tablet Take 1,000 mg by mouth 2 (two) times daily. 04/14/13   Historical Provider, MD  metoprolol tartrate (LOPRESSOR) 25 MG tablet TAKE 1 TABLET TWICE DAILY 04/24/16   Lorretta Harp, MD  nitroGLYCERIN (NITRODUR - DOSED IN MG/24 HR) 0.2 mg/hr patch Reported on 08/23/2015 01/17/14   Historical Provider, MD  omeprazole (PRILOSEC) 20 MG capsule TAKE 1 CAPSULE TWICE DAILY BEFORE MEALS 06/22/16   Jerene Bears, MD  ondansetron (ZOFRAN-ODT) 4 MG disintegrating tablet DISSOLVE ONE TABLET IN MOUTH EVERY 8 HOURS AS NEEDED FOR NAUSEA OR VOMITING 06/15/16   Jerene Bears, MD  potassium chloride SA (K-DUR,KLOR-CON) 20 MEQ tablet Take 1 tablet (20 mEq total) by mouth daily. 07/30/15   Lorretta Harp, MD  rosuvastatin (CRESTOR) 10 MG tablet TAKE 1/2 TABLET  EVERY DAY 10/15/15   [name], MD  sertraline (ZOLOFT) 25 MG tablet TAKE ONE TABLET BY MOUTH EVERY NIGHT FOR 7 TO 10 DAYS, THEN INCREASE TO TWO TABLETS EVERY NIGHT THEREAFTER 05/30/16   Jerene Bears, MD  traMADol (ULTRAM) 50 MG tablet Take 1 tablet (50 mg total) by mouth every 6 (six) hours as needed. 03/11/15   [name], MD    Family History Family History  Problem Relation Age of Onset  . Diabetes Mother   .  Skin cancer Mother   . Lung cancer Father   . Lymphoma Sister   . Hypertension Sister   . Breast cancer Sister   . Diabetes Sister   . Hypertension Sister   . Breast cancer Sister   . Diabetes Sister   . Hypertension Sister   . Inflammatory bowel disease Daughter   . Esophageal cancer Son   . Stroke Neg Hx   . Colon cancer Neg Hx     Social History Social History  Substance Use Topics  . Smoking status: Former Smoker    Packs/day: 2.00    Years: 20.00    Types: Cigarettes    Quit date: 05/09/1975  . Smokeless tobacco: Never Used     Comment: smoked 1958-1977, up to 2 ppd  . Alcohol use 0.0 oz/week     Comment: rare     Allergies   Patient has no known allergies.   Review of Systems Review of Systems  All other systems reviewed and are negative.    Physical Exam Updated Vital Signs BP 126/73 (BP Location: Left Arm)   Pulse 73   Temp 98.3 F (36.8 C) (Oral)   Resp 20   SpO2 98%   Physical Exam  Nursing note and vitals reviewed.  76 year old female, resting comfortably and in no acute distress. Vital signs are Normal. Oxygen saturation is 98%, which is normal. Head is normocephalic and atraumatic. PERRLA, EOMI. Oropharynx is clear. Neck is nontender and supple without adenopathy or JVD. Back is nontender and there is no CVA tenderness. Lungs are clear without rales, wheezes, or rhonchi. Chest is nontender. Heart has regular rate and rhythm with 3/6 systolic ejection murmur best heard at the upper right sternal border. Abdomen is soft, flat,  nontender without masses or hepatosplenomegaly and peristalsis is normoactive. Extremities have no cyanosis or edema, full range of motion is present. Skin is warm and dry without rash. Neurologic: She is awake, alert, oriented 3. Speech is spontaneous and appropriate and without dysarthria, cranial nerves are intact, there are no motor or sensory deficits. There is no pronator drift. Finger to nose testing is normal. On Romberg testing, she is mildly unsteady with a very slight tendency to fall towards the left. Gait is somewhat unsteady but she does not tend to fall in either direction.  ED Treatments / Results  Labs (all labs ordered are listed, but only abnormal results are displayed) Labs Reviewed  CBC - Abnormal; Notable for the following:       Result Value   RBC 3.39 (*)    Hemoglobin 9.1 (*)    HCT 29.0 (*)    RDW 17.3 (*)    All other components within normal limits  COMPREHENSIVE METABOLIC PANEL - Abnormal; Notable for the following:    Potassium 3.3 (*)    Creatinine, Ser 1.09 (*)    Albumin 3.4 (*)    AST 43 (*)    GFR calc non Af Amer 48 (*)    GFR calc Af Amer 56 (*)    All other components within normal limits  PROTIME-INR - Abnormal; Notable for the following:    Prothrombin Time 15.5 (*)    All other components within normal limits  DIFFERENTIAL  URINALYSIS, ROUTINE W REFLEX MICROSCOPIC  APTT  I-STAT TROPOININ, ED  CBG MONITORING, ED    EKG  EKG Interpretation  Date/Time:  Tuesday July 04 2016 16:23:22 EST Ventricular Rate:  73 PR Interval:    QRS Duration: 99  QT Interval:  461 QTC Calculation: 508 R Axis:   23 Text Interpretation:  Sinus rhythm Prolonged QT interval Baseline wander in lead(s) V3 V5 When compared with ECG of 04/11/2016, No significant change was found Confirmed by [facility]  MD, Maher Shon ([id number]) on 07/04/2016 5:38:04 PM       Radiology Ct Head Wo Contrast  Result Date: 07/04/2016 CLINICAL DATA:  76 year old hypertensive female leaning  to left-side when walking. Confusion. Initial encounter. EXAM: CT HEAD WITHOUT CONTRAST TECHNIQUE: Contiguous axial images were obtained from the base of the skull through the vertex without intravenous contrast. COMPARISON:  04/11/2016 FINDINGS: Brain: No intracranial hemorrhage or CT evidence of large acute infarct. Chronic microvascular changes. Global atrophy without hydrocephalus. No intracranial mass lesion noted on this unenhanced exam. Vascular: Vascular calcifications without hyperdense vessel. Skull: Mild hyperostosis frontalis interna greater on the left. Sinuses/Orbits: Post lens replacement. Minimal exophthalmos. No acute orbital abnormality. Visualized paranasal sinuses are clear. Other: Negative IMPRESSION: No intracranial hemorrhage or CT evidence of large acute infarct. Chronic microvascular changes. Atrophy. Electronically Signed   By: Genia Del M.D.   On: 07/04/2016 17:40   Mr Jeri Cos [id number] Contrast  Result Date: 07/04/2016 CLINICAL DATA:  Gait abnormality EXAM: MRI HEAD WITHOUT AND WITH CONTRAST TECHNIQUE: Multiplanar, multiecho pulse sequences of the brain and surrounding structures were obtained without and with intravenous contrast. CONTRAST:  48mL MULTIHANCE GADOBENATE DIMEGLUMINE 529 MG/ML IV SOLN COMPARISON:  Head CT 07/04/2016 Brain MRI 12/29/2015 FINDINGS: Brain: No focal diffusion restriction to indicate acute infarct. No intraparenchymal hemorrhage. There is beginning confluent hyperintense T2-weighted signal within the periventricular and deep white matter, most often seen in the setting of chronic microvascular ischemia. No mass lesion or midline shift. No hydrocephalus or extra-axial fluid collection. The midline structures are normal. No age advanced or lobar predominant atrophy. No contrast-enhancing lesions. Vascular: Major intracranial arterial and venous sinus flow voids are preserved. Old microhemorrhage in the inferior left frontal lobe. Skull and upper cervical spine:  The visualized skull base, calvarium, upper cervical spine and extracranial soft tissues are normal. Sinuses/Orbits: No fluid levels or advanced mucosal thickening. No mastoid effusion. Normal orbits. IMPRESSION: Chronic microvascular ischemia without acute intracranial abnormality. Electronically Signed   By: Ulyses Jarred M.D.   On: 07/04/2016 21:33    Procedures Procedures (including critical care time)  Medications Ordered in ED Medications  potassium chloride SA (K-DUR,KLOR-CON) CR tablet 40 mEq (not administered)  gadobenate dimeglumine (MULTIHANCE) injection 15 mL (15 mLs Intravenous Contrast Given 07/04/16 1952)     Initial Impression / Assessment and Plan / ED Course  I have reviewed the triage vital signs and the nursing notes.  Pertinent labs & imaging results that were available during my care of the patient were reviewed by me and considered in my medical decision making (see chart for details).  Balance difficulty which has been present intermittently for over 6 months. Old records are reviewed, and she was seen in the ED in December for unsteady gait and had negative workup which included CT of head but not MRI. She had an MRI last August to evaluate dementia. On closer review of her records, last June, she was started on sertraline. This does seem to correlate with the time when she started having some problems. She will be evaluated in the ED and if workup is negative, will consider trial of stopping sertraline. No evidence of stroke today.  MRI shows no evidence of tumor or stroke. Laboratory workup shows anemia which have been  present previously, and mild hypokalemia. She is on supplemental potassium and is given extra potassium prior to discharge. She is referred back to her neurologist for further workup. She is advised to discontinue sertraline to see if it improves her symptoms.  Final Clinical Impressions(s) / ED Diagnoses   Final diagnoses:  Unsteady gait    Normochromic normocytic anemia    New Prescriptions New Prescriptions   No medications on file     Delora Fuel, MD [id number] [phone]

## 2016-07-04 NOTE — ED Notes (Signed)
Bed: WA02 Expected date:  Expected time:  Means of arrival:  Comments: No bed  

## 2016-07-08 ENCOUNTER — Other Ambulatory Visit: Payer: Self-pay | Admitting: Family Medicine

## 2016-07-10 ENCOUNTER — Other Ambulatory Visit: Payer: Medicare Other

## 2016-07-10 LAB — COMPLETE METABOLIC PANEL WITH GFR
ALT: 15 U/L (ref 6–29)
AST: 31 U/L (ref 10–35)
Albumin: 3.6 g/dL (ref 3.6–5.1)
Alkaline Phosphatase: 136 U/L — ABNORMAL HIGH (ref 33–130)
BUN: 12 mg/dL (ref 7–25)
CHLORIDE: 101 mmol/L (ref 98–110)
CO2: 26 mmol/L (ref 20–31)
Calcium: 9.8 mg/dL (ref 8.6–10.4)
Creat: 0.92 mg/dL (ref 0.60–0.93)
GFR, EST AFRICAN AMERICAN: 70 mL/min (ref >=60)
GFR, EST NON AFRICAN AMERICAN: 61 mL/min (ref >=60)
GLUCOSE: 92 mg/dL (ref 65–99)
Potassium: 3.8 mmol/L (ref 3.5–5.3)
SODIUM: 142 mmol/L (ref 135–146)
Total Bilirubin: 0.7 mg/dL (ref 0.2–1.2)
Total Protein: 7 g/dL (ref 6.1–8.1)

## 2016-07-10 LAB — CBC
HEMATOCRIT: 31.9 % — AB (ref 35.0–45.0)
Hemoglobin: 10.1 g/dL — ABNORMAL LOW (ref 11.7–15.5)
MCH: 27.6 pg (ref 27.0–33.0)
MCHC: 31.7 g/dL — ABNORMAL LOW (ref 32.0–36.0)
MCV: 87.2 fL (ref 80.0–100.0)
MPV: 9.7 fL (ref 7.5–12.5)
Platelets: 179 10*3/uL (ref 140–400)
RBC: 3.66 MIL/uL — AB (ref 3.80–5.10)
RDW: 17.1 % — ABNORMAL HIGH (ref 11.0–15.0)
WBC: 4.2 10*3/uL (ref 3.8–10.8)

## 2016-07-11 LAB — PROTIME-INR
INR: 1.2 — ABNORMAL HIGH
Prothrombin Time: 12.4 s — ABNORMAL HIGH (ref 9.0–11.5)

## 2016-07-11 NOTE — Telephone Encounter (Signed)
Can we refill this? 

## 2016-07-19 ENCOUNTER — Other Ambulatory Visit: Payer: Self-pay | Admitting: Interventional Radiology

## 2016-07-19 ENCOUNTER — Ambulatory Visit (HOSPITAL_COMMUNITY)
Admission: RE | Admit: 2016-07-19 | Discharge: 2016-07-19 | Disposition: A | Discharge status: Home / Self Care | Payer: Medicare Other | Source: Ambulatory Visit | Attending: Interventional Radiology | Admitting: Interventional Radiology

## 2016-07-19 ENCOUNTER — Ambulatory Visit
Admission: RE | Admit: 2016-07-19 | Discharge: 2016-07-19 | Disposition: A | Discharge status: Home / Self Care | Payer: Medicare Other | Source: Ambulatory Visit | Attending: Interventional Radiology | Admitting: Interventional Radiology

## 2016-07-19 DIAGNOSIS — C220 Liver cell carcinoma: Secondary | ICD-10-CM | POA: Diagnosis not present

## 2016-07-19 DIAGNOSIS — R161 Splenomegaly, not elsewhere classified: Secondary | ICD-10-CM | POA: Insufficient documentation

## 2016-07-19 DIAGNOSIS — K7460 Unspecified cirrhosis of liver: Secondary | ICD-10-CM | POA: Diagnosis not present

## 2016-07-19 DIAGNOSIS — K862 Cyst of pancreas: Secondary | ICD-10-CM | POA: Diagnosis not present

## 2016-07-19 DIAGNOSIS — I700 Atherosclerosis of aorta: Secondary | ICD-10-CM | POA: Insufficient documentation

## 2016-07-19 DIAGNOSIS — K769 Liver disease, unspecified: Secondary | ICD-10-CM

## 2016-07-19 HISTORY — PX: IR RADIOLOGIST EVAL & MGMT: IMG5224

## 2016-07-19 MED ORDER — GADOBENATE DIMEGLUMINE 529 MG/ML IV SOLN
15.0000 mL | Freq: Once | INTRAVENOUS | Status: AC | PRN
  Administered 2016-07-19: 15 mL via INTRAVENOUS

## 2016-07-19 NOTE — Progress Notes (Signed)
Chief Complaint: Patient was seen in follow-up today for suspicious liver lesions Chief Complaint  Patient presents with  . Follow-up    follow up   at the request of Brion Sossamon  Referring Physician(s): Cheril Slattery  History of Present Illness: [name] [name] is a 76 y.o. 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  One of the lesions was previously biopsied in April of 2017 and found to be a regenerating nodule.    She has been under Q3 mo imaging surveillance and there was growth of one of the lesions on MRI in Dec 2017.  A repeat biopsy was attempted in late Dec 2017 but the lesions are occult by [place].    She presents today for 3 month follow up evaluation.  Clinically, she is doing well and has no new complaints.  She denies abdominal pain, unintentional weight loss, fatigue or other sx.  She continues to struggle with chronic diarrhea.    Her MRI today demonstrates continued change in her central segment 6 lesion which has enlarged over the past 3 months from 1.8 cm to 3.8 cm.  Imaging features are now LI-RADS 5 and diagnostic of [place].  The smaller lesion in segment 8 remains highly suspicious but stable at 1.3 cm.    She also has an indeterminate pancreatic lesion that requires annual follow up and an incompletely imaged signal abnormality in the small bowel mesentery near the IMA which warrants further work-up.   Past Medical History:  Diagnosis Date  . Adenocarcinoma, breast [facility])    sees Dr. Marcy Panning   . Allergy    environmental  . Arthritis    sees Dr. Hurley Cisco   . Barrett esophagus 12/20/11  . Bladder cancer ([place])   . Breast cancer ([place])   . Cancer, hepatocellular ([place])   . Dementia    mild  . Diabetes mellitus    sees Dr. Chalmers Cater   .  Diverticulosis of colon (without mention of hemorrhage) 09/26/2004,09/10/2006  . Gastritis   . GERD (gastroesophageal reflux disease)   . Hemorrhoids   . Hepatic cirrhosis ([place])   . Hepatic steatosis   . Hiatal hernia   . Hyperglycemia   . Hyperlipidemia   . Hypertension   . IBS (irritable bowel syndrome)   . Inflammatory polyps of colon ([place])   . Iron deficiency anemia   . Malignant neoplasm of colon, unspecified site 07/08/2003  . Personal history of chemotherapy   . Personal history of radiation therapy   . Sleep apnea    sees Dr. Gwenette Greet   . Transaminase or LDH elevation    sees Dr. Zenovia Jarred     Past Surgical History:  Procedure Laterality Date  . bladder cancer 2001    . BREAST BIOPSY    . BREAST SURGERY  2001   lymph nodes removed and rt mastectomy  . CARPAL TUNNEL RELEASE    . [place]   for diverticulitis  . COLONOSCOPY    . ESOPHAGEAL DILATION    . incisonal hernia    . IR GENERIC HISTORICAL  04/25/2016   IR RADIOLOGIST EVAL & MGMT 04/25/2016 [name], MD [facility] INTERV RAD  . MASTECTOMY    . mastectomy and biopsy    . sigmoid colectomy 2005     for cancer  . squamous  cell CA  R  calf  2008      Allergies: Patient has no known allergies.  Medications: Prior to Admission medications   Medication Sig Start Date End Date Taking? Authorizing Provider  aspirin EC 81 MG tablet Take 81 mg by mouth at bedtime.   Yes Historical Provider, MD  Calcium Carbonate-Vitamin D (CALCIUM 600+D) 600-400 MG-UNIT tablet Take 1 tablet by mouth daily.   Yes Historical Provider, MD  diphenoxylate-atropine (LOMOTIL) 2.5-0.025 MG tablet Take 2 tablets by mouth 4 (four) times daily as needed for diarrhea or loose stools. 06/15/16  Yes Jerene Bears, MD  donepezil (ARICEPT) 10 MG tablet TAKE 1 TABLET AT BEDTIME 05/31/16  Yes [name], MD  furosemide (LASIX) 40 MG tablet Take 40 mg by mouth daily.   Yes Historical Provider, MD  insulin NPH Human (HUMULIN N,NOVOLIN  N) 100 UNIT/ML injection Inject 30-40 Units into the skin 2 (two) times daily. Pt uses 30 units in the morning and 40 units at bedtime.   Yes Historical Provider, MD  meclizine (ANTIVERT) 25 MG tablet Take 1 tablet (25 mg total) by mouth every 4 (four) hours as needed for dizziness. 05/13/14  Yes [name], MD  memantine (NAMENDA) 10 MG tablet TAKE 1 TABLET TWICE DAILY 07/11/16  Yes [name], MD  metFORMIN (GLUCOPHAGE-XR) 500 MG 24 hr tablet Take 1,000 mg by mouth 2 (two) times daily.   Yes Historical Provider, MD  metoprolol tartrate (LOPRESSOR) 25 MG tablet TAKE 1 TABLET TWICE DAILY 04/24/16  Yes Lorretta Harp, MD  omega-3 acid ethyl esters (LOVAZA) 1 g capsule Take 1 g by mouth at bedtime.    Yes Historical Provider, MD  omeprazole (PRILOSEC) 20 MG capsule TAKE 1 CAPSULE TWICE DAILY BEFORE MEALS 06/22/16  Yes Jerene Bears, MD  ondansetron (ZOFRAN-ODT) 4 MG disintegrating tablet DISSOLVE ONE TABLET IN MOUTH EVERY 8 HOURS AS NEEDED FOR NAUSEA OR VOMITING Patient taking differently: Take 4 mg by mouth every 8 (eight) hours as needed for nausea or vomiting.  06/15/16  Yes Jerene Bears, MD  potassium chloride SA (K-DUR,KLOR-CON) 20 MEQ tablet Take 1 tablet (20 mEq total) by mouth daily. 07/30/15  Yes Lorretta Harp, MD  rosuvastatin (CRESTOR) 10 MG tablet TAKE 1/2 TABLET EVERY DAY 10/15/15  Yes [name], MD  traMADol (ULTRAM) 50 MG tablet Take 1 tablet (50 mg total) by mouth every 6 (six) hours as needed. Patient taking differently: Take 50 mg by mouth every 6 (six) hours as needed for moderate pain.  03/11/15  Yes [name], MD     Family History  Problem Relation Age of Onset  . Diabetes Mother   . Skin cancer Mother   . Lung cancer Father   . Lymphoma Sister   . Hypertension Sister   . Breast cancer Sister   . Diabetes Sister   . Hypertension Sister   . Breast cancer Sister   . Diabetes Sister   . Hypertension Sister   . Inflammatory bowel disease Daughter   . Esophageal  cancer Son   . Stroke Neg Hx   . Colon cancer Neg Hx     Social History   Social History  . Marital status: Married    Spouse name: Carmelia Roller  . Number of children: 2  . Years of education: N/A   Occupational History  . retired     Emergency planning/management officer & ran [place]  Topics  . Smoking status: Former Smoker    Packs/day: 2.00    Years: 20.00    Types: Cigarettes    Quit date: 05/09/1975  . Smokeless tobacco: Never Used     Comment: smoked 1958-1977, up to 2 ppd  . Alcohol use 0.0 oz/week     Comment: rare  . Drug use: Yes    Types: Methylphenidate  . Sexual activity: Not on file   Other Topics Concern  . 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  All other systems are negative.  Review of Systems  Vital Signs: BP 125/69 (BP Location: Left Arm, Patient Position: Sitting, Cuff Size: Normal)   Pulse 97   Temp 99.3 F (37.4 C) (Oral)   Resp 15   Ht 5\' 5"  (1.651 m)   Wt 165 lb (74.8 kg)   SpO2 96%   BMI 27.46 kg/m   Physical Exam  Constitutional: She appears well-developed and well-nourished. No distress.  HENT:  Head: Normocephalic and atraumatic.  Eyes: No scleral icterus.  Cardiovascular: Normal rate and regular rhythm.   Pulmonary/Chest: Effort normal and breath sounds normal.  Abdominal: Soft. She exhibits no distension. There is no tenderness.  Neurological: She is alert.  Skin: Skin is warm and dry.  Psychiatric: She has a normal mood and affect. Her behavior is normal.  Nursing note and vitals reviewed.    Imaging: Ct Head Wo Contrast  Result Date: 07/04/2016 CLINICAL DATA:  76 year old hypertensive female leaning to left-side when walking. Confusion. Initial encounter. EXAM: CT HEAD WITHOUT CONTRAST  TECHNIQUE: Contiguous axial images were obtained from the base of the skull through the vertex without intravenous contrast. COMPARISON:  04/11/2016 FINDINGS: Brain: No intracranial hemorrhage or CT evidence of large acute infarct. Chronic microvascular changes. Global atrophy without hydrocephalus. No intracranial mass lesion noted on this unenhanced exam. Vascular: Vascular calcifications without hyperdense vessel. Skull: Mild hyperostosis frontalis interna greater on the left. Sinuses/Orbits: Post lens replacement. Minimal exophthalmos. No acute orbital abnormality. Visualized paranasal sinuses are clear. Other: Negative IMPRESSION: No intracranial hemorrhage or CT evidence of large acute infarct. Chronic microvascular changes. Atrophy. Electronically Signed   By: Genia Del M.D.   On: 07/04/2016 17:40   Mr Jeri Cos NW Contrast  Result Date: 07/04/2016 CLINICAL DATA:  Gait abnormality EXAM: MRI HEAD WITHOUT AND WITH CONTRAST TECHNIQUE: Multiplanar, multiecho pulse sequences of the brain and surrounding structures were obtained without and with intravenous contrast. CONTRAST:  44mL MULTIHANCE GADOBENATE DIMEGLUMINE 529 MG/ML IV SOLN COMPARISON:  Head CT 07/04/2016 Brain MRI 12/29/2015 FINDINGS: Brain: No focal diffusion restriction to indicate acute infarct. No intraparenchymal hemorrhage. There is beginning confluent hyperintense T2-weighted signal within the periventricular and deep white matter, most often seen in the setting of chronic microvascular ischemia. No mass lesion or midline shift. No hydrocephalus or extra-axial fluid collection. The midline structures are normal. No age advanced or lobar predominant atrophy. No contrast-enhancing lesions. Vascular: Major intracranial arterial and venous sinus flow voids are preserved. Old microhemorrhage in the inferior left frontal lobe. Skull and upper cervical spine: The visualized skull base, calvarium, upper cervical spine and extracranial soft tissues  are normal. Sinuses/Orbits: No fluid levels or advanced mucosal thickening. No mastoid effusion. Normal orbits. IMPRESSION: Chronic microvascular  ischemia without acute intracranial abnormality. Electronically Signed   By: Ulyses Jarred M.D.   On: 07/04/2016 21:33   Mr Abdomen Wwo Contrast  Result Date: 07/19/2016 CLINICAL DATA:  Cirrhosis. Patient presents for follow-up of hypervascular liver lesions. Ultrasound-guided biopsy of a right liver lobe lesion performed 07/19/2015 was negative for malignancy. EXAM: MRI ABDOMEN WITHOUT AND WITH CONTRAST TECHNIQUE: Multiplanar multisequence MR imaging of the abdomen was performed both before and after the administration of intravenous contrast. CONTRAST:  66mL MULTIHANCE GADOBENATE DIMEGLUMINE 529 MG/ML IV SOLN COMPARISON:  04/10/2016 MRI abdomen. FINDINGS: Lower chest: Grossly clear lung bases. Hepatobiliary: Diffusely irregular liver surface and diffusely heterogeneous liver parenchyma compatible with cirrhosis. No hepatic steatosis. Liver lesions as follows: - inferior right liver lobe 3.8 x 3.1 cm mass straddling segments 5 and 6 with arterial phase hyperenhancement, portal venous phase washout and restricted diffusion, increased from 1.8 x 1.3 cm on 04/10/2016, considered a LI-RADS category 5 lesion - segment 8 right liver lobe 1.3 x 1.2 cm mass with arterial phase hyperenhancement and portal venous phase washout, previously 1.3 x 1.0 cm on 04/10/2016 and 1.3 x 1.1 cm on 07/05/2015 MRI, stable in size, previously visualized on the 07/19/2015 abdominal sonogram, consider a LI-RADS category 5us lesion No new liver masses. Normal gallbladder with no cholelithiasis. No biliary ductal dilatation. Common bile duct diameter Sign mm. No choledocholithiasis. Pancreas: There is a 1.6 x 1.2 cm lobulated cystic pancreatic neck mass (series 5/ image 26) without enhancement, wall thickening or thickened internal septations, which communicates with the main pancreatic duct,  previously 1.9 x 1.4 cm on 04/10/2016 and 1.5 x 1.2 cm on 07/05/2015 MRI using similar measurement technique, slightly decreased in size in the interval. Main pancreatic duct diameter 2 mm, within normal limits. No additional pancreatic lesions. No pancreas divisum. Spleen: Mild splenomegaly. Craniocaudal splenic length 13.0 cm, stable. No splenic mass. Adrenals/Urinary Tract: No discrete adrenal mass. No hydronephrosis. Simple subcentimeter renal cyst in the posterior upper left kidney. No suspicious renal masses. Stomach/Bowel: Grossly normal stomach. Visualized small and large bowel is normal caliber, with no bowel wall thickening. Vascular/Lymphatic: Atherosclerotic nonaneurysmal abdominal aorta. Patent hepatic, portal, splenic and renal veins. There is a newly enlarged infiltrative appearing 2.2 cm lower left para-aortic node (series 904/ image 92), incompletely visualized on this study. Mildly enlarged 1.0 cm right pericardiophrenic lymph node (series 905/image 19), not appreciably changed back to the 04/13/2011 MRI, suggesting a benign reactive node. Other: No abdominal ascites or focal fluid collection. Right mastectomy. Musculoskeletal: No aggressive appearing focal osseous lesions. IMPRESSION: 1. Significant interval growth of inferior right liver lobe LI-RADS category 5 lesion (definitely hepatocellular carcinoma), now 3.8 cm and straddling segments 5 and 6 of the right liver lobe. 2. Interval stability of 1.3 cm segment 8 right liver lobe LI-RADS category 5us lesion (definitely hepatocellular carcinoma). 3. Cirrhosis.  Stable mild splenomegaly.  No ascites. 4. Partial visualization of a newly enlarged infiltrative appearing left lower para-aortic lymph node. Consider CT abdomen/pelvis with IV and oral contrast for further characterization. 5. Cystic pancreatic neck 1.6 cm mass without high risk MRI features, slightly decreased in size in the interval. Annual MRI abdomen without and with IV contrast  follow-up is advised for 5 years. This recommendation follows ACR consensus guidelines: Management of Incidental Pancreatic Cysts: A White Paper of the ACR Incidental Findings Committee. J Am Coll Radiol 5176;16:073-710. 6. Aortic atherosclerosis. Electronically Signed   By: Ilona Sorrel M.D.   On: 07/19/2016 13:41    Labs:  CBC:  Recent Labs  05/04/16 1129 06/15/16 1201 07/04/16 1746 07/10/16 2111  WBC 4.4 5.8 4.3 4.2  HGB 9.8* 10.0* 9.1* 10.1*  HCT 30.4* 30.5* 29.0* 31.9*  PLT 175 183.0 PLATELET CLUMPS NOTED ON SMEAR, UNABLE TO ESTIMATE 179    COAGS:  Recent Labs  04/11/16 1457 05/04/16 1129 06/15/16 1201 07/04/16 1830 07/11/16 0638  INR 1.29 1.23 1.3* 1.22 1.2*  APTT 39* 43*  --  36  --     BMP:  Recent Labs  04/11/16 1457 04/11/16 1504 06/15/16 1201 07/04/16 1746 07/10/16 2223  NA 141 142 141 139 142  K 3.6 3.6 3.7 3.3* 3.8  CL 106 104 101 102 101  CO2 28  --  30 23 26   GLUCOSE 112* 111* 143* 90 92  BUN 11 12 11 13 12   CALCIUM 9.8  --  9.5 9.0 9.8  CREATININE 0.88 0.80 0.86 1.09* 0.92  GFRNONAA >60  --   --  48* 61  GFRAA >60  --   --  56* 70    LIVER FUNCTION TESTS:  Recent Labs  04/11/16 1457 06/15/16 1201 07/04/16 1746 07/10/16 2223  BILITOT 0.9 0.9 0.8 0.7  AST 42* 33 43* 31  ALT 19 19 19 15   ALKPHOS 109 121* 102 136*  PROT 6.8 7.4 7.1 7.0  ALBUMIN 3.1* 3.7 3.4* 3.6    TUMOR MARKERS:  Recent Labs  08/13/15 1354 06/15/16 1201  AFPTM 2.7 5.5    Assessment and Plan:  Continued evolution of segment 6 liver lesion now definitively a 3.8 cm HCC.  The smaller segment 8 lesion remains stable at 1.3 cm.  Stable cystic pancreatic lesion and new small bowel mesenteric signal abnormality which is only partially imaged.  The mesenteric finding can be seen dating back to 2197 but certainly appears more conspicuous and concerning on today's exam.  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   However, metastatic disease is difficult to exclude entirely.   At 3.8 cm, her [place] would be ideal for combined TACE + interval MWA.  We should attempt to treat both the segment 6 and 8 lesions concurrently.   1.) CT Abd/Pelvis with oral and IV contrast to be performed at [facility] ASAP to evaluate the mesenteric abnormality near the IMA.  2.) Tentatively schedule for transarterial chemoembolization (TACE) of segment 6 and 8 lesions as soon as possible once we have confirmed on the above CT that there isn't a more significant concomitant problem.   3.) Schedule for MWA 2-4 weeks after TACE.  4.) The pancreas lesion can be followed along with her [facility] on her upcoming MRI studies.     Electronically Signed: [name] 07/19/2016, 8:11 PM   I spent a total of 25 Minutes in face to face in clinical consultation, greater than 50% of which was counseling/coordinating care for hepatocellular cancer.

## 2016-07-21 ENCOUNTER — Ambulatory Visit (HOSPITAL_COMMUNITY)
Admission: RE | Admit: 2016-07-21 | Discharge: 2016-07-21 | Disposition: A | Discharge status: Home / Self Care | Payer: Medicare Other | Source: Ambulatory Visit | Attending: Interventional Radiology | Admitting: Interventional Radiology

## 2016-07-21 DIAGNOSIS — I700 Atherosclerosis of aorta: Secondary | ICD-10-CM | POA: Insufficient documentation

## 2016-07-21 DIAGNOSIS — K7460 Unspecified cirrhosis of liver: Secondary | ICD-10-CM | POA: Diagnosis not present

## 2016-07-21 DIAGNOSIS — K862 Cyst of pancreas: Secondary | ICD-10-CM | POA: Insufficient documentation

## 2016-07-21 DIAGNOSIS — K5730 Diverticulosis of large intestine without perforation or abscess without bleeding: Secondary | ICD-10-CM | POA: Insufficient documentation

## 2016-07-21 DIAGNOSIS — C220 Liver cell carcinoma: Secondary | ICD-10-CM | POA: Insufficient documentation

## 2016-07-21 DIAGNOSIS — Z9049 Acquired absence of other specified parts of digestive tract: Secondary | ICD-10-CM | POA: Insufficient documentation

## 2016-07-21 MED ORDER — IOPAMIDOL (ISOVUE-300) INJECTION 61%
100.0000 mL | Freq: Once | INTRAVENOUS | Status: AC | PRN
  Administered 2016-07-21: 100 mL via INTRAVENOUS

## 2016-07-21 MED ORDER — IOPAMIDOL (ISOVUE-300) INJECTION 61%
INTRAVENOUS | Status: AC
  Administered 2016-07-21: 100 mL
  Filled 2016-07-21: qty 100

## 2016-07-28 ENCOUNTER — Other Ambulatory Visit: Payer: Self-pay | Admitting: Radiology

## 2016-07-31 MED ORDER — LC BEADS 100-300UM IN SALINE
75.0000 mg | Freq: Once | Status: AC
  Administered 2016-08-01: 76 mg via INTRA_ARTERIAL
  Filled 2016-07-31 (×3): qty 38

## 2016-08-01 ENCOUNTER — Ambulatory Visit (HOSPITAL_COMMUNITY)
Admission: RE | Admit: 2016-08-01 | Discharge: 2016-08-01 | Disposition: A | Discharge status: Home / Self Care | Payer: Medicare Other | Source: Ambulatory Visit | Attending: Interventional Radiology | Admitting: Interventional Radiology

## 2016-08-01 ENCOUNTER — Observation Stay (HOSPITAL_COMMUNITY)
Admission: RE | Admit: 2016-08-01 | Discharge: 2016-08-02 | Disposition: A | Discharge status: Home / Self Care | Payer: Medicare Other | Source: Ambulatory Visit | Attending: Interventional Radiology | Admitting: Interventional Radiology

## 2016-08-01 ENCOUNTER — Encounter (HOSPITAL_COMMUNITY): Payer: Self-pay

## 2016-08-01 DIAGNOSIS — K219 Gastro-esophageal reflux disease without esophagitis: Secondary | ICD-10-CM | POA: Diagnosis not present

## 2016-08-01 DIAGNOSIS — F0390 Unspecified dementia without behavioral disturbance: Secondary | ICD-10-CM | POA: Insufficient documentation

## 2016-08-01 DIAGNOSIS — I10 Essential (primary) hypertension: Secondary | ICD-10-CM | POA: Insufficient documentation

## 2016-08-01 DIAGNOSIS — M1990 Unspecified osteoarthritis, unspecified site: Secondary | ICD-10-CM | POA: Diagnosis not present

## 2016-08-01 DIAGNOSIS — Z853 Personal history of malignant neoplasm of breast: Secondary | ICD-10-CM | POA: Insufficient documentation

## 2016-08-01 DIAGNOSIS — K7581 Nonalcoholic steatohepatitis (NASH): Secondary | ICD-10-CM | POA: Diagnosis not present

## 2016-08-01 DIAGNOSIS — K589 Irritable bowel syndrome without diarrhea: Secondary | ICD-10-CM | POA: Diagnosis not present

## 2016-08-01 DIAGNOSIS — R9389 Abnormal findings on diagnostic imaging of other specified body structures: Secondary | ICD-10-CM | POA: Diagnosis present

## 2016-08-01 DIAGNOSIS — C220 Liver cell carcinoma: Principal | ICD-10-CM | POA: Insufficient documentation

## 2016-08-01 DIAGNOSIS — Z85038 Personal history of other malignant neoplasm of large intestine: Secondary | ICD-10-CM | POA: Diagnosis not present

## 2016-08-01 DIAGNOSIS — G4730 Sleep apnea, unspecified: Secondary | ICD-10-CM | POA: Insufficient documentation

## 2016-08-01 DIAGNOSIS — K7460 Unspecified cirrhosis of liver: Secondary | ICD-10-CM | POA: Insufficient documentation

## 2016-08-01 DIAGNOSIS — Z9221 Personal history of antineoplastic chemotherapy: Secondary | ICD-10-CM | POA: Insufficient documentation

## 2016-08-01 DIAGNOSIS — K449 Diaphragmatic hernia without obstruction or gangrene: Secondary | ICD-10-CM | POA: Diagnosis not present

## 2016-08-01 DIAGNOSIS — Z923 Personal history of irradiation: Secondary | ICD-10-CM | POA: Insufficient documentation

## 2016-08-01 DIAGNOSIS — E1165 Type 2 diabetes mellitus with hyperglycemia: Secondary | ICD-10-CM | POA: Diagnosis not present

## 2016-08-01 DIAGNOSIS — E785 Hyperlipidemia, unspecified: Secondary | ICD-10-CM | POA: Diagnosis not present

## 2016-08-01 DIAGNOSIS — D509 Iron deficiency anemia, unspecified: Secondary | ICD-10-CM | POA: Insufficient documentation

## 2016-08-01 DIAGNOSIS — R310 Gross hematuria: Secondary | ICD-10-CM | POA: Insufficient documentation

## 2016-08-01 DIAGNOSIS — C228 Malignant neoplasm of liver, primary, unspecified as to type: Secondary | ICD-10-CM | POA: Diagnosis not present

## 2016-08-01 HISTORY — PX: IR GENERIC HISTORICAL: IMG1180011

## 2016-08-01 LAB — CBC WITH DIFFERENTIAL/PLATELET
BASOS ABS: 0 10*3/uL (ref 0.0–0.1)
BASOS PCT: 0 %
EOS PCT: 2 %
Eosinophils Absolute: 0.1 10*3/uL (ref 0.0–0.7)
HCT: 33.3 % — ABNORMAL LOW (ref 36.0–46.0)
Hemoglobin: 10.5 g/dL — ABNORMAL LOW (ref 12.0–15.0)
Lymphocytes Relative: 24 %
Lymphs Abs: 1.4 10*3/uL (ref 0.7–4.0)
MCH: 27.5 pg (ref 26.0–34.0)
MCHC: 31.5 g/dL (ref 30.0–36.0)
MCV: 87.2 fL (ref 78.0–100.0)
MONO ABS: 0.8 10*3/uL (ref 0.1–1.0)
MONOS PCT: 15 %
Neutro Abs: 3.3 10*3/uL (ref 1.7–7.7)
Neutrophils Relative %: 59 %
PLATELETS: 193 10*3/uL (ref 150–400)
RBC: 3.82 MIL/uL — ABNORMAL LOW (ref 3.87–5.11)
RDW: 17.7 % — AB (ref 11.5–15.5)
WBC: 5.6 10*3/uL (ref 4.0–10.5)

## 2016-08-01 LAB — COMPREHENSIVE METABOLIC PANEL
ALT: 22 U/L (ref 14–54)
ANION GAP: 13 (ref 5–15)
AST: 50 U/L — AB (ref 15–41)
Albumin: 3.8 g/dL (ref 3.5–5.0)
Alkaline Phosphatase: 125 U/L (ref 38–126)
BUN: 13 mg/dL (ref 6–20)
CO2: 24 mmol/L (ref 22–32)
Calcium: 9.7 mg/dL (ref 8.9–10.3)
Chloride: 103 mmol/L (ref 101–111)
Creatinine, Ser: 1.14 mg/dL — ABNORMAL HIGH (ref 0.44–1.00)
GFR calc Af Amer: 53 mL/min — ABNORMAL LOW (ref >60)
GFR, EST NON AFRICAN AMERICAN: 46 mL/min — AB (ref >60)
GLUCOSE: 70 mg/dL (ref 65–99)
Potassium: 3.3 mmol/L — ABNORMAL LOW (ref 3.5–5.1)
Sodium: 140 mmol/L (ref 135–145)
TOTAL PROTEIN: 8.8 g/dL — AB (ref 6.5–8.1)
Total Bilirubin: 1 mg/dL (ref 0.3–1.2)

## 2016-08-01 LAB — PROTIME-INR
INR: 1.18
PROTHROMBIN TIME: 15.1 s (ref 11.4–15.2)

## 2016-08-01 LAB — GLUCOSE, CAPILLARY
Glucose-Capillary: 58 mg/dL — ABNORMAL LOW (ref 65–99)
Glucose-Capillary: 129 mg/dL — ABNORMAL HIGH (ref 65–99)

## 2016-08-01 MED ORDER — SODIUM CHLORIDE 0.9 % IV SOLN
INTRAVENOUS | Status: DC
  Administered 2016-08-01: 12:00:00 via INTRAVENOUS

## 2016-08-01 MED ORDER — SODIUM CHLORIDE 0.9% FLUSH: 3.0000 mL | INTRAVENOUS | Status: DC | PRN

## 2016-08-01 MED ORDER — SODIUM CHLORIDE 0.9% FLUSH: 3.0000 mL | Freq: Two times a day (BID) | INTRAVENOUS | Status: DC

## 2016-08-01 MED ORDER — ONDANSETRON HCL 4 MG/2ML IJ SOLN: 4.0000 mg | Freq: Four times a day (QID) | INTRAMUSCULAR | Status: DC | PRN

## 2016-08-01 MED ORDER — ONDANSETRON HCL 4 MG/2ML IJ SOLN
4.0000 mg | Freq: Once | INTRAMUSCULAR | Status: AC
  Administered 2016-08-01: 4 mg via INTRAVENOUS
  Filled 2016-08-01: qty 2

## 2016-08-01 MED ORDER — SODIUM CHLORIDE 0.9 % IV SOLN: 250.0000 mL | INTRAVENOUS | Status: DC | PRN

## 2016-08-01 MED ORDER — IOPAMIDOL (ISOVUE-300) INJECTION 61%
100.0000 mL | Freq: Once | INTRAVENOUS | Status: AC | PRN
  Administered 2016-08-01: 45 mL via INTRA_ARTERIAL

## 2016-08-01 MED ORDER — LIDOCAINE HCL 1 % IJ SOLN
INTRAMUSCULAR | Status: AC
  Filled 2016-08-01: qty 20

## 2016-08-01 MED ORDER — FENTANYL CITRATE (PF) 100 MCG/2ML IJ SOLN
INTRAMUSCULAR | Status: AC
  Filled 2016-08-01: qty 4

## 2016-08-01 MED ORDER — MIDAZOLAM HCL 2 MG/2ML IJ SOLN
INTRAMUSCULAR | Status: AC
  Filled 2016-08-01: qty 6

## 2016-08-01 MED ORDER — ONDANSETRON HCL 4 MG/2ML IJ SOLN
INTRAMUSCULAR | Status: AC
  Filled 2016-08-01: qty 2

## 2016-08-01 MED ORDER — SODIUM CHLORIDE 0.9% FLUSH
3.0000 mL | Freq: Two times a day (BID) | INTRAVENOUS | Status: DC
  Administered 2016-08-01: 3 mL via INTRAVENOUS

## 2016-08-01 MED ORDER — DOCUSATE SODIUM 100 MG PO CAPS: 100.0000 mg | ORAL_CAPSULE | Freq: Two times a day (BID) | ORAL | Status: DC

## 2016-08-01 MED ORDER — IOPAMIDOL (ISOVUE-300) INJECTION 61%
100.0000 mL | Freq: Once | INTRAVENOUS | Status: AC | PRN
  Administered 2016-08-01: 46 mL via INTRA_ARTERIAL

## 2016-08-01 MED ORDER — IOPAMIDOL (ISOVUE-300) INJECTION 61%
INTRAVENOUS | Status: AC
  Administered 2016-08-01: 45 mL via INTRA_ARTERIAL
  Filled 2016-08-01: qty 200

## 2016-08-01 MED ORDER — OXYCODONE HCL 5 MG PO TABS: 5.0000 mg | ORAL_TABLET | ORAL | Status: DC | PRN

## 2016-08-01 MED ORDER — MIDAZOLAM HCL 2 MG/2ML IJ SOLN
INTRAMUSCULAR | Status: AC | PRN
  Administered 2016-08-01: 0.5 mg via INTRAVENOUS
  Administered 2016-08-01: 1 mg via INTRAVENOUS

## 2016-08-01 MED ORDER — DEXAMETHASONE SODIUM PHOSPHATE 10 MG/ML IJ SOLN
INTRAMUSCULAR | Status: AC
  Filled 2016-08-01: qty 1

## 2016-08-01 MED ORDER — PIPERACILLIN-TAZOBACTAM 3.375 G IVPB
3.3750 g | Freq: Three times a day (TID) | INTRAVENOUS | Status: DC
Start: 2016-08-01 — End: 2016-08-02

## 2016-08-01 MED ORDER — PROMETHAZINE HCL 25 MG PO TABS: 25.0000 mg | ORAL_TABLET | Freq: Three times a day (TID) | ORAL | Status: DC | PRN

## 2016-08-01 MED ORDER — SODIUM CHLORIDE 0.9 % IV SOLN
INTRAVENOUS | Status: DC
  Administered 2016-08-01: 17:00:00 via INTRAVENOUS

## 2016-08-01 MED ORDER — PIPERACILLIN-TAZOBACTAM 3.375 G IVPB
INTRAVENOUS | Status: AC
  Administered 2016-08-01: 3.375 g via INTRAVENOUS
  Filled 2016-08-01: qty 50

## 2016-08-01 MED ORDER — PROMETHAZINE HCL 25 MG RE SUPP: 25.0000 mg | Freq: Three times a day (TID) | RECTAL | Status: DC | PRN

## 2016-08-01 MED ORDER — LIDOCAINE HCL 1 % IJ SOLN
INTRAMUSCULAR | Status: AC | PRN
  Administered 2016-08-01: 5 mL

## 2016-08-01 MED ORDER — DEXAMETHASONE SODIUM PHOSPHATE 10 MG/ML IJ SOLN
4.0000 mg | Freq: Once | INTRAMUSCULAR | Status: AC
  Administered 2016-08-01: 4 mg via INTRAVENOUS
  Filled 2016-08-01: qty 1

## 2016-08-01 MED ORDER — DEXTROSE 5 % IV SOLN
INTRAVENOUS | Status: DC
  Administered 2016-08-01: 12:00:00 via INTRAVENOUS

## 2016-08-01 MED ORDER — PIPERACILLIN-TAZOBACTAM 3.375 G IVPB
3.3750 g | Freq: Once | INTRAVENOUS | Status: AC
  Administered 2016-08-01: 3.375 g via INTRAVENOUS

## 2016-08-01 MED ORDER — SODIUM CHLORIDE 0.9 % IV SOLN
INTRAVENOUS | Status: DC
  Administered 2016-08-02: 02:00:00 via INTRAVENOUS

## 2016-08-01 MED ORDER — ALUM & MAG HYDROXIDE-SIMETH 200-200-20 MG/5ML PO SUSP
30.0000 mL | Freq: Four times a day (QID) | ORAL | Status: DC | PRN
  Administered 2016-08-01 – 2016-08-02 (×2): 30 mL via ORAL
  Filled 2016-08-01 (×2): qty 30

## 2016-08-01 MED ORDER — FENTANYL CITRATE (PF) 100 MCG/2ML IJ SOLN
INTRAMUSCULAR | Status: AC | PRN
  Administered 2016-08-01: 50 ug via INTRAVENOUS

## 2016-08-01 NOTE — Progress Notes (Signed)
On assessment pt c/o moderate indigestion r/t overeating at [employer].  Pt states that she was so excited to eat something other than jello that she ate too fast and ate too much.  Pt requesting medication to ease discomfort.  Reviewed orders.  No prn meds noted for indigestion.  On call paged through IR answering service.  Received orders from MD on call for Maalox 88ml po q6hrs prn.  Will give dose to patient once approved by pharmacist.  Will cont to monitor pt status.

## 2016-08-01 NOTE — Progress Notes (Signed)
Blood sugar 58, PA Saverio Danker at bedside talking with pt and her family, she was informed of sugar and she states to change IVF from NS to D5.

## 2016-08-01 NOTE — Sedation Documentation (Signed)
5 Fr sheath removed from R fem artery by Dr. Laurence Ferrari. Hemostasis achieved using Exoseal closure device. Groin level 0, 3+RDP, 2+RPT.

## 2016-08-01 NOTE — Sedation Documentation (Signed)
Gauze/tegaderm bandage applied to R femoral puncture site. Groin level 0, 3+RDP, 2RPT, Drsg CDI.

## 2016-08-01 NOTE — Procedures (Signed)
Interventional Radiology Procedure Note  Procedure: DEB-TACE  Vascular Access: Right CFA, 26F --> ExoSeal  Complications: None  Estimated Blood Loss: None  Recommendations: - Bedrest x 4 hrs - ADAT - Hydrate - Labs in am  Signed,  Criselda Peaches, MD

## 2016-08-01 NOTE — H&P (Deleted)
  The note originally documented on this encounter has been moved the the encounter in which it belongs.  

## 2016-08-01 NOTE — H&P (Signed)
Chief Complaint: [facility]  Referring Physician: Dr. Dominica Severin B. Sherrill  Supervising Physician: [name]  Patient Status: [facility] - Out-pt  HPI: [name] [name] is an 76 y.o. female well known to Dr. Laurence Ferrari for possible [facility].  She had a bx of a liver lesion in February 2017 which revealed no evidence of malignancy; however a new MRI she had at the end of 2017 in December suggested this was [facility].  We tried for a repeat bx but the lesion was not visible at this time in December.  She followed up with Dr. Laurence Ferrari in March of this year after another MRI.  This states interval growth of this right lobe lesion that is definitely [place] along with cirrhosis.  The patient has now been scheduled for a DEB-TACE procedure with a MWA to follow.  She presents today for this procedure.  Past Medical History:  Past Medical History:  Diagnosis Date  . Adenocarcinoma, breast [facility])    sees Dr. Marcy Panning   . Allergy    environmental  . Arthritis    sees Dr. Hurley Cisco   . Barrett esophagus 12/20/11  . Bladder cancer ([place])   . Breast cancer ([place])   . Cancer, hepatocellular ([place])   . Dementia    mild  . Diabetes mellitus    sees Dr. Chalmers Cater   . Diverticulosis of colon (without mention of hemorrhage) 09/26/2004,09/10/2006  . Gastritis   . GERD (gastroesophageal reflux disease)   . Hemorrhoids   . Hepatic cirrhosis ([place])   . Hepatic steatosis   . Hiatal hernia   . Hyperglycemia   . Hyperlipidemia   . Hypertension   . IBS (irritable bowel syndrome)   . Inflammatory polyps of colon ([place])   . Iron deficiency anemia   . Malignant neoplasm of colon, unspecified site 07/08/2003  . Personal history of chemotherapy   . Personal history of radiation therapy   . Sleep apnea    sees Dr. Gwenette Greet   . Transaminase or LDH elevation    sees Dr. Zenovia Jarred     Past Surgical History:  Past Surgical History:  Procedure Laterality Date  . bladder cancer 2001    . BREAST BIOPSY    . BREAST SURGERY   2001   lymph nodes removed and rt mastectomy  . CARPAL TUNNEL RELEASE    . [place]   for diverticulitis  . COLONOSCOPY    . ESOPHAGEAL DILATION    . incisonal hernia    . IR GENERIC HISTORICAL  04/25/2016   IR RADIOLOGIST EVAL & MGMT 04/25/2016 [name], MD [facility] INTERV RAD  . MASTECTOMY    . mastectomy and biopsy    . sigmoid colectomy 2005     for cancer  . squamous cell CA  R  calf  2008      Family History:  Family History  Problem Relation Age of Onset  . Diabetes Mother   . Skin cancer Mother   . Lung cancer Father   . Lymphoma Sister   . Hypertension Sister   . Breast cancer Sister   . Diabetes Sister   . Hypertension Sister   . Breast cancer Sister   . Diabetes Sister   . Hypertension Sister   . Inflammatory bowel disease Daughter   . Esophageal cancer Son   . Stroke Neg Hx   . Colon cancer Neg Hx     Social History:  reports that she quit smoking about 41  years ago. Her smoking use included Cigarettes. She has a 40.00 pack-year smoking history. She has never used smokeless tobacco. She reports that she drinks alcohol. She reports that she uses drugs, including Methylphenidate.  Allergies: No Known Allergies  Medications: Medications reviewed in epic  Please HPI for pertinent positives, otherwise complete 10 system ROS negative.  Mallampati Score: MD Evaluation Airway: WNL Heart: WNL Abdomen: WNL Chest/ Lungs: WNL ASA  Classification: 3 Mallampati/Airway Score: Two  Physical Exam: BP 131/63   Pulse 74   Temp 98.4 F (36.9 C) (Oral)   Resp 16   SpO2 100%  There is no height or weight on file to calculate BMI. General: pleasant, WD, WN white female who is laying in bed in NAD HEENT: head is normocephalic, atraumatic.  Sclera are noninjected.  PERRL.  Ears and nose without any masses or lesions.  Mouth is pink and moist Heart: regular, rate, and rhythm.  Normal s1,s2. No obvious murmurs, gallops, or rubs noted.  Palpable radial  and pedal pulses bilaterally Lungs: CTAB, no wheezes, rhonchi, or rales noted.  Respiratory effort nonlabored Abd: soft, NT, ND, +BS, no masses, hernias, or organomegaly Psych: A&Ox3 with an appropriate affect.   Labs: Results for orders placed or performed during the hospital encounter of 08/01/16 (from the past 48 hour(s))  CBC with Differential/Platelet     Status: Abnormal   Collection Time: 08/01/16 11:54 AM  Result Value Ref Range   WBC 5.6 4.0 - 10.5 K/uL   RBC 3.82 (L) 3.87 - 5.11 MIL/uL   Hemoglobin 10.5 (L) 12.0 - 15.0 g/dL   HCT 33.3 (L) 36.0 - 46.0 %   MCV 87.2 78.0 - 100.0 fL   MCH 27.5 26.0 - 34.0 pg   MCHC 31.5 30.0 - 36.0 g/dL   RDW 17.7 (H) 11.5 - 15.5 %   Platelets 193 150 - 400 K/uL   Neutrophils Relative % 59 %   Neutro Abs 3.3 1.7 - 7.7 K/uL   Lymphocytes Relative 24 %   Lymphs Abs 1.4 0.7 - 4.0 K/uL   Monocytes Relative 15 %   Monocytes Absolute 0.8 0.1 - 1.0 K/uL   Eosinophils Relative 2 %   Eosinophils Absolute 0.1 0.0 - 0.7 K/uL   Basophils Relative 0 %   Basophils Absolute 0.0 0.0 - 0.1 K/uL  Comprehensive metabolic panel     Status: Abnormal   Collection Time: 08/01/16 11:54 AM  Result Value Ref Range   Sodium 140 135 - 145 mmol/L   Potassium 3.3 (L) 3.5 - 5.1 mmol/L   Chloride 103 101 - 111 mmol/L   CO2 24 22 - 32 mmol/L   Glucose, Bld 70 65 - 99 mg/dL   BUN 13 6 - 20 mg/dL   Creatinine, Ser 1.14 (H) 0.44 - 1.00 mg/dL   Calcium 9.7 8.9 - 10.3 mg/dL   Total Protein 8.8 (H) 6.5 - 8.1 g/dL   Albumin 3.8 3.5 - 5.0 g/dL   AST 50 (H) 15 - 41 U/L   ALT 22 14 - 54 U/L   Alkaline Phosphatase 125 38 - 126 U/L   Total Bilirubin 1.0 0.3 - 1.2 mg/dL   GFR calc non Af Amer 46 (L) >60 mL/min   GFR calc Af Amer 53 (L) >60 mL/min    Comment: (NOTE) The eGFR has been calculated using the CKD EPI equation. This calculation has not been validated in all clinical situations. eGFR's persistently <60 mL/min signify possible Chronic Kidney Disease.  Anion  gap 13 5 - 15  Protime-INR     Status: None   Collection Time: 08/01/16 11:54 AM  Result Value Ref Range   Prothrombin Time 15.1 11.4 - 15.2 seconds   INR 1.18     Imaging: No results found.  Assessment/Plan 1. Hepatocellular carcinoma -we will plan to proceed today with a DEB-TACE.  She will be admitted overnight for observation with plans to return home tomorrow if she is doing well. -labs and vitals have been reviewed -Risks and Benefits discussed with the patient including, but not limited to bleeding, infection, vascular injury, contrast induced renal failure, post procedural pain, nausea, vomiting, fatigue, progression of liver failure, chemical cholecystitis, or need for additional procedures. All of the patient's questions were answered, patient is agreeable to proceed. Consent signed and in chart.   Thank you for this interesting consult.  I greatly enjoyed meeting [name] [name] and look forward to participating in their care.  A copy of this report was sent to the requesting provider on this date.  Electronically Signed: Henreitta Cea 08/01/2016, 1:28 PM   I spent a total of    25 Minutes in face to face in clinical consultation, greater than 50% of which was counseling/coordinating care for [facility]

## 2016-08-02 ENCOUNTER — Encounter (HOSPITAL_COMMUNITY): Payer: Self-pay | Admitting: Interventional Radiology

## 2016-08-02 DIAGNOSIS — M1990 Unspecified osteoarthritis, unspecified site: Secondary | ICD-10-CM | POA: Diagnosis not present

## 2016-08-02 DIAGNOSIS — K7460 Unspecified cirrhosis of liver: Secondary | ICD-10-CM | POA: Diagnosis not present

## 2016-08-02 DIAGNOSIS — F0390 Unspecified dementia without behavioral disturbance: Secondary | ICD-10-CM | POA: Diagnosis not present

## 2016-08-02 DIAGNOSIS — C220 Liver cell carcinoma: Secondary | ICD-10-CM | POA: Diagnosis not present

## 2016-08-02 DIAGNOSIS — K219 Gastro-esophageal reflux disease without esophagitis: Secondary | ICD-10-CM | POA: Diagnosis not present

## 2016-08-02 DIAGNOSIS — K7581 Nonalcoholic steatohepatitis (NASH): Secondary | ICD-10-CM | POA: Diagnosis not present

## 2016-08-02 LAB — CBC
HCT: 27.7 % — ABNORMAL LOW (ref 36.0–46.0)
Hemoglobin: 9 g/dL — ABNORMAL LOW (ref 12.0–15.0)
MCH: 27.9 pg (ref 26.0–34.0)
MCHC: 32.5 g/dL (ref 30.0–36.0)
MCV: 85.8 fL (ref 78.0–100.0)
PLATELETS: 130 10*3/uL — AB (ref 150–400)
RBC: 3.23 MIL/uL — AB (ref 3.87–5.11)
RDW: 17.2 % — ABNORMAL HIGH (ref 11.5–15.5)
WBC: 4.5 10*3/uL (ref 4.0–10.5)

## 2016-08-02 LAB — COMPREHENSIVE METABOLIC PANEL
ALBUMIN: 2.9 g/dL — AB (ref 3.5–5.0)
ALT: 25 U/L (ref 14–54)
AST: 61 U/L — AB (ref 15–41)
Alkaline Phosphatase: 96 U/L (ref 38–126)
Anion gap: 9 (ref 5–15)
BUN: 14 mg/dL (ref 6–20)
CHLORIDE: 104 mmol/L (ref 101–111)
CO2: 24 mmol/L (ref 22–32)
CREATININE: 1.14 mg/dL — AB (ref 0.44–1.00)
Calcium: 8.5 mg/dL — ABNORMAL LOW (ref 8.9–10.3)
GFR calc Af Amer: 53 mL/min — ABNORMAL LOW (ref >60)
GFR calc non Af Amer: 46 mL/min — ABNORMAL LOW (ref >60)
GLUCOSE: 217 mg/dL — AB (ref 65–99)
Potassium: 3.8 mmol/L (ref 3.5–5.1)
SODIUM: 137 mmol/L (ref 135–145)
Total Bilirubin: 1.1 mg/dL (ref 0.3–1.2)
Total Protein: 7 g/dL (ref 6.5–8.1)

## 2016-08-02 LAB — GLUCOSE, CAPILLARY: GLUCOSE-CAPILLARY: 74 mg/dL (ref 65–99)

## 2016-08-02 MED ORDER — INSULIN NPH (HUMAN) (ISOPHANE) 100 UNIT/ML ~~LOC~~ SUSP: 40.0000 [IU] | Freq: Every day | SUBCUTANEOUS | Status: DC

## 2016-08-02 MED ORDER — ROSUVASTATIN CALCIUM 5 MG PO TABS: 5.0000 mg | ORAL_TABLET | Freq: Every day | ORAL | Status: DC

## 2016-08-02 MED ORDER — POTASSIUM CHLORIDE CRYS ER 20 MEQ PO TBCR: 20.0000 meq | EXTENDED_RELEASE_TABLET | Freq: Every day | ORAL | Status: DC

## 2016-08-02 MED ORDER — DONEPEZIL HCL 10 MG PO TABS: 10.0000 mg | ORAL_TABLET | Freq: Every day | ORAL | Status: DC

## 2016-08-02 MED ORDER — METOPROLOL TARTRATE 25 MG PO TABS: 25.0000 mg | ORAL_TABLET | Freq: Two times a day (BID) | ORAL | Status: DC

## 2016-08-02 MED ORDER — INSULIN NPH (HUMAN) (ISOPHANE) 100 UNIT/ML ~~LOC~~ SUSP: 30.0000 [IU] | Freq: Two times a day (BID) | SUBCUTANEOUS | Status: DC

## 2016-08-02 MED ORDER — PANTOPRAZOLE SODIUM 40 MG PO TBEC: 40.0000 mg | DELAYED_RELEASE_TABLET | Freq: Every day | ORAL | Status: DC

## 2016-08-02 MED ORDER — FUROSEMIDE 20 MG PO TABS: 40.0000 mg | ORAL_TABLET | Freq: Every day | ORAL | Status: DC

## 2016-08-02 MED ORDER — INSULIN NPH (HUMAN) (ISOPHANE) 100 UNIT/ML ~~LOC~~ SUSP
30.0000 [IU] | Freq: Every day | SUBCUTANEOUS | Status: DC
  Filled 2016-08-02 (×8): qty 10

## 2016-08-02 MED ORDER — DIPHENOXYLATE-ATROPINE 2.5-0.025 MG PO TABS: 2.0000 | ORAL_TABLET | Freq: Four times a day (QID) | ORAL | Status: DC | PRN

## 2016-08-02 NOTE — Progress Notes (Signed)
08/02/16  1300  Reviewed discharge instructions with patient. Patient verbalized understanding of discharge instructions. Copy of discharge instructions given to patient.

## 2016-08-02 NOTE — Discharge Summary (Signed)
Patient ID: [name] [name] MRN: [id number] DOB/AGE: [birth year] 76 y.o.  Admit date: 08/01/2016 Discharge date: 08/02/2016  Supervising Physician: [name]  Patient Status: [facility] - In-pt  Admission Diagnoses: hepatocellular carcinoma  Discharge Diagnoses: hepatocellular carcinoma, s/p successful drug eluting beads chemoembolization of segment 6 [facility] 08/01/16 Active Problems:   Hepatocellular carcinoma ([place])   Abnormal angiogram  Past Medical History:  Diagnosis Date  . Adenocarcinoma, breast [facility])    sees Dr. Marcy Panning   . Allergy    environmental  . Arthritis    sees Dr. Hurley Cisco   . Barrett esophagus 12/20/11  . Bladder cancer ([place])   . Breast cancer ([place])   . Cancer, hepatocellular ([place])   . Dementia    mild  . Diabetes mellitus    sees Dr. Chalmers Cater   . Diverticulosis of colon (without mention of hemorrhage) 09/26/2004,09/10/2006  . Gastritis   . GERD (gastroesophageal reflux disease)   . Hemorrhoids   . Hepatic cirrhosis ([place])   . Hepatic steatosis   . Hiatal hernia   . Hyperglycemia   . Hyperlipidemia   . Hypertension   . IBS (irritable bowel syndrome)   . Inflammatory polyps of colon ([place])   . Iron deficiency anemia   . Malignant neoplasm of colon, unspecified site 07/08/2003  . Personal history of chemotherapy   . Personal history of radiation therapy   . Sleep apnea    sees Dr. Gwenette Greet   . Transaminase or LDH elevation    sees Dr. Zenovia Jarred    Past Surgical History:  Procedure Laterality Date  . bladder cancer 2001    . BREAST BIOPSY    . BREAST SURGERY  2001   lymph nodes removed and rt mastectomy  . CARPAL TUNNEL RELEASE    . [place]   for diverticulitis  . COLONOSCOPY    . ESOPHAGEAL DILATION    . incisonal hernia    . IR GENERIC HISTORICAL  04/25/2016   IR RADIOLOGIST EVAL & MGMT 04/25/2016 [name], MD [facility] INTERV RAD  . IR GENERIC HISTORICAL  08/01/2016   IR 3D INDEPENDENT WKST 08/01/2016 [facility]    . IR GENERIC HISTORICAL  08/01/2016   IR ANGIOGRAM VISCERAL SELECTIVE 08/01/2016 [facility]  . IR GENERIC HISTORICAL  08/01/2016   IR EMBO TUMOR ORGAN ISCHEMIA INFARCT INC GUIDE ROADMAPPING 08/01/2016 [facility]  . IR GENERIC HISTORICAL  08/01/2016   IR US GUIDE VASC ACCESS RIGHT 08/01/2016 [facility]  . IR GENERIC HISTORICAL  08/01/2016   IR ANGIOGRAM SELECTIVE EACH ADDITIONAL VESSEL 08/01/2016 [facility]  . IR GENERIC HISTORICAL  08/01/2016   IR ANGIOGRAM SELECTIVE EACH ADDITIONAL VESSEL 08/01/2016 [facility]  . MASTECTOMY    . mastectomy and biopsy    . sigmoid colectomy 2005     for cancer  . 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 She was seen by Dr. Laurence Ferrari in [place] clinic on 04/25/16 and deemed an appropriate candidate for hepatic DEB-TACE followed by thermal ablation of her [place]. On 08/01/16 she underwent successful drug-eluting beads chemoembolization of segment 6 HCC via IV conscious sedation. She was also noted to have a nonspecific hypervascular blush measuring approximately 1 cm in the superior aspect of hepatic segment 2 which be followed up on future imaging. The  procedure was performed without immediate complications and she was admitted to the hospital for overnight observation. Overnight she did well with exception of some mild indigestion. On the morning of discharge she was stable. She denied any acute complaints. She was able to void (but did have some blood-tinged urine/no dysuria), tolerate her diet and ambulate without significant difficulty. Laboratory studies were stable. Above findings were discussed with Dr. Laurence Ferrari and she was deemed appropriate for discharge at this time. She was given a prescription for Cipro 500 mg, #14, no refills, 1 tablet twice daily for 1 week. She will be scheduled for thermal ablation of  the above-mentioned [facility] on 09/01/16 at 12 noon at [facility]. She will resume her usual home medications with exception of metformin which she will resume tomorrow. She was told to contact our service with any additional questions or concerns.  Consults: none  Significant Diagnostic Studies:  Results for orders placed or performed during the hospital encounter of 08/01/16  CBC  Result Value Ref Range   WBC 4.5 4.0 - 10.5 K/uL   RBC 3.23 (L) 3.87 - 5.11 MIL/uL   Hemoglobin 9.0 (L) 12.0 - 15.0 g/dL   HCT 27.7 (L) 36.0 - 46.0 %   MCV 85.8 78.0 - 100.0 fL   MCH 27.9 26.0 - 34.0 pg   MCHC 32.5 30.0 - 36.0 g/dL   RDW 17.2 (H) 11.5 - 15.5 %   Platelets 130 (L) 150 - 400 K/uL  Comprehensive metabolic panel  Result Value Ref Range   Sodium 137 135 - 145 mmol/L   Potassium 3.8 3.5 - 5.1 mmol/L   Chloride 104 101 - 111 mmol/L   CO2 24 22 - 32 mmol/L   Glucose, Bld 217 (H) 65 - 99 mg/dL   BUN 14 6 - 20 mg/dL   Creatinine, Ser 1.14 (H) 0.44 - 1.00 mg/dL   Calcium 8.5 (L) 8.9 - 10.3 mg/dL   Total Protein 7.0 6.5 - 8.1 g/dL   Albumin 2.9 (L) 3.5 - 5.0 g/dL   AST 61 (H) 15 - 41 U/L   ALT 25 14 - 54 U/L   Alkaline Phosphatase 96 38 - 126 U/L   Total Bilirubin 1.1 0.3 - 1.2 mg/dL   GFR calc non Af Amer 46 (L) >60 mL/min   GFR calc Af Amer 53 (L) >60 mL/min   Anion gap 9 5 - 15     Treatments: Successful drug-eluting beads chemoembolization of segment 6 hepatocellular carcinoma on 08/01/16  Discharge Exam: Blood pressure 132/68, pulse 76, temperature 98 F (36.7 C), temperature source Oral, resp. rate 16, height 5\' 5"  (1.651 m), weight 166 lb (75.3 kg), SpO2 94 %. Patient awake, alert. Chest clear to auscultation bilaterally. Heart with regular rate and rhythm. Abdomen soft, positive bowel sounds, nontender. Puncture site right common femoral artery soft, clean, dry, nontender, no hematoma. Intact distal pulses and no lower extremity edema.  Disposition: 01-Home or Self  Care  Discharge Instructions    Call MD for:  difficulty breathing, headache or visual disturbances    Complete by:  As directed    Call MD for:  extreme fatigue    Complete by:  As directed    Call MD for:  hives    Complete by:  As directed    Call MD for:  persistant dizziness or light-headedness    Complete by:  As directed    Call MD for:  persistant nausea and vomiting    Complete by:  As directed    Call MD for:  redness, tenderness, or signs of infection (pain, swelling, redness, odor or green/yellow discharge around incision site)    Complete by:  As directed    Call MD for:  severe uncontrolled pain    Complete by:  As directed    Call MD for:  temperature >100.4    Complete by:  As directed    Change dressing (specify)    Complete by:  As directed    May apply bandaid over right groin site for next 2-3 days; may wash site with soap and water   Diet - low sodium heart healthy    Complete by:  As directed    Discharge instructions    Complete by:  As directed    Do not resume metformin until 08/03/16; you are scheduled for thermal ablation of your liver tumor on 09/01/16 at 12:00 at [facility] -you should arrive at hospital by 10:00 am that day; contact [phone] with any questions regarding procedure   Driving Restrictions    Complete by:  As directed    No driving for next 48 hours   Increase activity slowly    Complete by:  As directed    Lifting restrictions    Complete by:  As directed    No heavy lifting for 3-4 days   May shower / Bathe    Complete by:  As directed    May walk up steps    Complete by:  As directed      Allergies as of 08/02/2016   No Known Allergies     Medication List    TAKE these medications   aspirin EC 81 MG tablet Take 81 mg by mouth daily.   CALCIUM 600+D 600-400 MG-UNIT tablet Generic drug:  Calcium Carbonate-Vitamin D Take 1 tablet by mouth daily.   diphenoxylate-atropine 2.5-0.025 MG tablet Commonly known as:   LOMOTIL Take 2 tablets by mouth 4 (four) times daily as needed for diarrhea or loose stools.   donepezil 10 MG tablet Commonly known as:  ARICEPT TAKE 1 TABLET AT BEDTIME   furosemide 40 MG tablet Commonly known as:  LASIX Take 40 mg by mouth daily.   insulin NPH Human 100 UNIT/ML injection Commonly known as:  HUMULIN N,NOVOLIN N Inject 30-40 Units into the skin 2 (two) times daily. Pt uses 30 units in the morning and 40 units at bedtime.   meclizine 25 MG tablet Commonly known as:  ANTIVERT Take 1 tablet (25 mg total) by mouth every 4 (four) hours as needed for dizziness.   memantine 10 MG tablet Commonly known as:  NAMENDA TAKE 1 TABLET TWICE DAILY   metFORMIN 500 MG 24 hr tablet Commonly known as:  GLUCOPHAGE-XR Take 1,000 mg by mouth 2 (two) times daily.   metoprolol tartrate 25 MG tablet Commonly known as:  LOPRESSOR TAKE 1 TABLET TWICE DAILY   omega-3 acid ethyl esters 1 g capsule Commonly known as:  LOVAZA Take 1 g by mouth at bedtime.   omeprazole 20 MG capsule Commonly known as:  PRILOSEC TAKE 1 CAPSULE TWICE DAILY BEFORE MEALS   ondansetron 4 MG disintegrating tablet Commonly known as:  ZOFRAN-ODT DISSOLVE ONE TABLET IN MOUTH EVERY 8 HOURS AS NEEDED FOR NAUSEA OR VOMITING What changed:  how much to take  how to take this  when to take this  reasons to take this  additional instructions   potassium chloride SA 20 MEQ tablet Commonly known as:  K-DUR,KLOR-CON Take 1 tablet (20 mEq total) by mouth daily.   rosuvastatin 10 MG tablet Commonly known as:  CRESTOR TAKE 1/2 TABLET EVERY DAY   traMADol 50 MG tablet Commonly known as:  ULTRAM Take 1 tablet (50 mg total) by mouth every 6 (six) hours as needed. What changed:  reasons to take this      Follow-up Information    [name], MD Follow up.   Specialties:  Interventional Radiology, Radiology Why:  You are scheduled for follow up thermal ablation of liver with Dr. Laurence Ferrari on  09/01/16 at 12 noon; arrive at [facility] by 10 am that day; call [phone] or [phone] with any questions Contact information: [place] [address] [phone]        Betsy Coder, MD Follow up.   Specialty:  Oncology Why:  continue care with Dr. Benay Spice as scheduled Contact information: [place] [address] [phone]            Electronically Signed: D. Nash Mantis 08/02/2016, 11:58 AM   I have spent less than 30 minutes discharging [name] [name].

## 2016-08-02 NOTE — Discharge Instructions (Signed)
Radiofrequency/Microwave/Thermal Ablation of Liver Tumors, Care After Refer to this sheet in the next few weeks. These instructions provide you with information on caring for yourself after your procedure. Your health care provider may also give you more specific instructions. Your treatment has been planned according to current medical practices, but problems sometimes occur. Call your health care provider if you have any problems or questions after your procedure. What can I expect after the procedure? After your procedure, you may have pain and discomfort in the upper abdomen. You will be given pain medicines to control this. Follow these instructions at home:  Take all medicines as directed by your health care provider.  Follow diet instructions as directed by your health care provider.  Follow instructions regarding rest and physical activity. Contact a health care provider if:  You cannot pass gas.  You cannot have a bowel movement within 2 days.  You have a skin rash.  You have a fever. Get help right away if:  You have severe or lasting abdominal pain or pain in your shoulder or back.  You have trouble swallowing or breathing.  You have severe weakness or dizziness.  You have chest pain or shortness of breath. This information is not intended to replace advice given to you by your health care provider. Make sure you discuss any questions you have with your health care provider. Document Released: 02/12/2013 Document Revised: 09/30/2015 Document Reviewed: 12/30/2012 Elsevier Interactive Patient Education  2017 [employer].

## 2016-08-05 ENCOUNTER — Other Ambulatory Visit: Payer: Self-pay | Admitting: Cardiovascular Disease

## 2016-08-15 ENCOUNTER — Other Ambulatory Visit (HOSPITAL_COMMUNITY): Payer: Self-pay | Admitting: Interventional Radiology

## 2016-08-15 DIAGNOSIS — K769 Liver disease, unspecified: Secondary | ICD-10-CM

## 2016-08-17 ENCOUNTER — Ambulatory Visit
Admission: RE | Admit: 2016-08-17 | Discharge: 2016-08-17 | Disposition: A | Discharge status: Home / Self Care | Payer: Medicare Other | Source: Ambulatory Visit | Attending: Interventional Radiology | Admitting: Interventional Radiology

## 2016-08-17 ENCOUNTER — Other Ambulatory Visit: Payer: Self-pay | Admitting: *Deleted

## 2016-08-17 DIAGNOSIS — C220 Liver cell carcinoma: Secondary | ICD-10-CM | POA: Diagnosis not present

## 2016-08-17 DIAGNOSIS — K769 Liver disease, unspecified: Secondary | ICD-10-CM

## 2016-08-17 HISTORY — PX: IR RADIOLOGIST EVAL & MGMT: IMG5224

## 2016-08-17 NOTE — Progress Notes (Signed)
Chief Complaint: Patient was seen in consultation today for  Chief Complaint  Patient presents with  . Follow-up    follow up DEB-TACE      Referring Physician(s): Dr. Dominica Severin B. Sherrill  Supervising Physician: [name]  History of Present Illness: [name] [name] is a 76 y.o. female well known to Dr. Laurence Ferrari for possible [facility].    She had a bx of a liver lesion in February 2017 which revealed no evidence of malignancy, however a new MRI she had at the end of 2017 in December suggested this was [facility].    We tried for a repeat bx but the lesion was not visible at this time in December.    She followed up with Dr. Laurence Ferrari in March of this year after another MRI.  This showed interval growth of this right lobe lesion that is definitely [place] along with cirrhosis.    She underwent DEB-TACE procedure on 08/01/2016 and has MWA planed for April 27,2018.  She is here today because her family called and was concerned about her weight loss, lack of appetite, and fatigue.  They report the fatigue was pretty much right after the procedure but the loss of appetite started about one week ago. They report this happened once before about a year ago and spontaneously resolved on it's own.  She also c/o some nausea with vomiting.  She takes Zofran and states this helps but she will only take it if she feels nauseated. Her husband reports she doesn't want to take it prior to eating to prevent nausea.  She has had a 12 lb weight loss since the procedure.  She drinks Dr. Malachi Bonds and reports "the foam comes back up". She takes Omeprazole for GERD.  She also c/o some nose bleeds. Her husbands states her nose "drips all the time" and she "stuffs Kleenex up her nose to control it". He feels this could be the cause of the nose bleeds.  She has also had a fever. On 4/10 it was 100.5, 4/11 it was 101.3, then this morning it was 101.5.  Today at the office her temp was 99.   Past Medical  History:  Diagnosis Date  . Adenocarcinoma, breast [facility])    sees Dr. Marcy Panning   . Allergy    environmental  . Arthritis    sees Dr. Hurley Cisco   . Barrett esophagus 12/20/11  . Bladder cancer ([place])   . Breast cancer [place])   . Cancer, hepatocellular ([place])   . Dementia    mild  . Diabetes mellitus    sees Dr. Chalmers Cater   . Diverticulosis of colon (without mention of hemorrhage) 09/26/2004,09/10/2006  . Gastritis   . GERD (gastroesophageal reflux disease)   . Hemorrhoids   . Hepatic cirrhosis ([place])   . Hepatic steatosis   . Hiatal hernia   . Hyperglycemia   . Hyperlipidemia   . Hypertension   . IBS (irritable bowel syndrome)   . Inflammatory polyps of colon ([place])   . Iron deficiency anemia   . Malignant neoplasm of colon, unspecified site 07/08/2003  . Personal history of chemotherapy   . Personal history of radiation therapy   . Sleep apnea    sees Dr. Gwenette Greet   . Transaminase or LDH elevation    sees Dr. Zenovia Jarred     Past Surgical History:  Procedure Laterality Date  . bladder cancer 2001    . BREAST BIOPSY    . BREAST SURGERY  2001   lymph nodes removed and rt mastectomy  . CARPAL TUNNEL RELEASE    . [place]   for diverticulitis  . COLONOSCOPY    . ESOPHAGEAL DILATION    . incisonal hernia    . IR GENERIC HISTORICAL  04/25/2016   IR RADIOLOGIST EVAL & MGMT 04/25/2016 [name], MD [facility] INTERV RAD  . IR GENERIC HISTORICAL  08/01/2016   IR 3D INDEPENDENT WKST 08/01/2016 [facility]  . IR GENERIC HISTORICAL  08/01/2016   IR ANGIOGRAM VISCERAL SELECTIVE 08/01/2016 [facility]  . IR GENERIC HISTORICAL  08/01/2016   IR EMBO TUMOR ORGAN ISCHEMIA INFARCT INC GUIDE ROADMAPPING 08/01/2016 [facility]  . IR GENERIC HISTORICAL  08/01/2016   IR US GUIDE VASC ACCESS RIGHT 08/01/2016 [facility]  . IR GENERIC HISTORICAL  08/01/2016   IR ANGIOGRAM SELECTIVE EACH ADDITIONAL VESSEL 08/01/2016 [facility]  . IR GENERIC HISTORICAL  08/01/2016   IR  ANGIOGRAM SELECTIVE EACH ADDITIONAL VESSEL 08/01/2016 [facility]  . MASTECTOMY    . mastectomy and biopsy    . sigmoid colectomy 2005     for cancer  . 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  Medications: Prior to Admission medications   Medication Sig Start Date End Date Taking? Authorizing Provider  aspirin EC 81 MG tablet Take 81 mg by mouth daily.     Historical Provider, MD  Calcium Carbonate-Vitamin D (CALCIUM 600+D) 600-400 MG-UNIT tablet Take 1 tablet by mouth daily.    Historical Provider, MD  diphenoxylate-atropine (LOMOTIL) 2.5-0.025 MG tablet Take 2 tablets by mouth 4 (four) times daily as needed for diarrhea or loose stools. 06/15/16   Jerene Bears, MD  donepezil (ARICEPT) 10 MG tablet TAKE 1 TABLET AT BEDTIME 05/31/16   [name], MD  furosemide (LASIX) 40 MG tablet Take 40 mg by mouth daily.    Historical Provider, MD  insulin NPH Human (HUMULIN N,NOVOLIN N) 100 UNIT/ML injection Inject 30-40 Units into the skin 2 (two) times daily. Pt uses 30 units in the morning and 40 units at bedtime.    Historical Provider, MD  meclizine (ANTIVERT) 25 MG tablet Take 1 tablet (25 mg total) by mouth every 4 (four) hours as needed for dizziness. 05/13/14   [name], MD  memantine (NAMENDA) 10 MG tablet TAKE 1 TABLET TWICE DAILY Patient not taking: Reported on 08/01/2016 07/11/16   [name], MD  metFORMIN (GLUCOPHAGE-XR) 500 MG 24 hr tablet Take 1,000 mg by mouth 2 (two) times daily.    Historical Provider, MD  metoprolol tartrate (LOPRESSOR) 25 MG tablet TAKE 1 TABLET TWICE DAILY 04/24/16   Lorretta Harp, MD  omega-3 acid ethyl esters (LOVAZA) 1 g capsule Take 1 g by mouth at bedtime.     Historical Provider, MD  omeprazole (PRILOSEC) 20 MG capsule TAKE 1 CAPSULE TWICE DAILY BEFORE MEALS 06/22/16   Jerene Bears, MD  ondansetron (ZOFRAN-ODT) 4 MG disintegrating tablet DISSOLVE ONE TABLET IN MOUTH EVERY 8 HOURS AS NEEDED FOR NAUSEA OR  VOMITING Patient taking differently: Take 4 mg by mouth every 8 (eight) hours as needed for nausea or vomiting.  06/15/16   Jerene Bears, MD  potassium chloride SA (K-DUR,KLOR-CON) 20 MEQ tablet Take 1 tablet (20 mEq total) by mouth daily. OVERDUE FOR FOLLOW UP. PLEASE CALL AND SCHEDULE [phone] 08/07/16   Lorretta Harp, MD  rosuvastatin (CRESTOR) 10  MG tablet TAKE 1/2 TABLET EVERY DAY 10/15/15   [name], MD  traMADol (ULTRAM) 50 MG tablet Take 1 tablet (50 mg total) by mouth every 6 (six) hours as needed. Patient taking differently: Take 50 mg by mouth every 6 (six) hours as needed for moderate pain.  03/11/15   [name], MD     Family History  Problem Relation Age of Onset  . Diabetes Mother   . Skin cancer Mother   . Lung cancer Father   . Lymphoma Sister   . Hypertension Sister   . Breast cancer Sister   . Diabetes Sister   . Hypertension Sister   . Breast cancer Sister   . Diabetes Sister   . Hypertension Sister   . Inflammatory bowel disease Daughter   . Esophageal cancer Son   . Stroke Neg Hx   . Colon cancer Neg Hx     Social History   Social History  . Marital status: Married    Spouse name: Carmelia Roller  . Number of children: 2  . Years of education: N/A   Occupational History  . 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 Smoking status: Former Smoker    Packs/day: 2.00    Years: 20.00    Types: Cigarettes    Quit date: 05/09/1975  . Smokeless tobacco: Never Used     Comment: smoked 1958-1977, up to 2 ppd  . Alcohol use 0.0 oz/week     Comment: rare  . Drug use: Yes    Types: Methylphenidate  . Sexual activity: Not Currently   Other Topics Concern  . 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 Negative for chills.  HENT: Positive for nosebleeds and rhinorrhea.   Respiratory: Negative for cough, chest tightness and shortness of breath.   Cardiovascular: Negative for chest pain.  Gastrointestinal: Positive for nausea and vomiting. Negative for abdominal pain.  Genitourinary: Negative for difficulty urinating, dysuria, frequency, hematuria and urgency.  Musculoskeletal: Negative.   Skin: Negative.   Neurological: Negative.   Hematological: Negative.   Psychiatric/Behavioral: Positive for confusion.    Vital Signs: BP 110/67 (BP Location: Left Arm, Patient Position: Sitting, Cuff Size: Normal)   Pulse 84   Temp 99.4 F (37.4 C) (Oral)   Resp 14   Ht 5\' 5"  (1.651 m)   Wt 154 lb (69.9 kg)   SpO2 97%   BMI 25.63 kg/m   Physical Exam  Constitutional: She is oriented to person, place, and time. She appears well-developed.  HENT:  Head: Normocephalic and atraumatic.  Eyes: EOM are normal.  Neck: Normal range of motion.  Cardiovascular: Normal rate, regular rhythm and normal heart sounds.   Pulmonary/Chest: Effort normal and breath sounds normal. No respiratory distress. She has no wheezes. She has no rales.  Abdominal: Soft. She exhibits no distension. There is no tenderness. There is no guarding.  Neurological: She is alert and oriented to person, place, and time.  + Mild dementia but appropriate today  Psychiatric: She has a normal mood and affect. Her behavior is normal. Judgment and thought content normal.  Vitals reviewed.    Imaging:  Mr Abdomen Wwo Contrast  Result Date: 07/19/2016 CLINICAL DATA:  Cirrhosis. Patient presents for follow-up of hypervascular liver lesions. Ultrasound-guided biopsy of a right liver lobe lesion performed 07/19/2015 was negative for malignancy. EXAM: MRI ABDOMEN WITHOUT AND WITH CONTRAST TECHNIQUE: Multiplanar multisequence MR imaging of the abdomen was performed both before and after the  administration of intravenous contrast. CONTRAST:  79mL MULTIHANCE GADOBENATE DIMEGLUMINE 529 MG/ML IV SOLN COMPARISON:  04/10/2016 MRI abdomen. FINDINGS: Lower chest: Grossly clear lung bases. Hepatobiliary: Diffusely irregular liver surface and diffusely heterogeneous liver parenchyma compatible with cirrhosis. No hepatic steatosis. Liver lesions as follows: - inferior right liver lobe 3.8 x 3.1 cm mass straddling segments 5 and 6 with arterial phase hyperenhancement, portal venous phase washout and restricted diffusion, increased from 1.8 x 1.3 cm on 04/10/2016, considered a LI-RADS category 5 lesion - segment 8 right liver lobe 1.3 x 1.2 cm mass with arterial phase hyperenhancement and portal venous phase washout, previously 1.3 x 1.0 cm on 04/10/2016 and 1.3 x 1.1 cm on 07/05/2015 MRI, stable in size, previously visualized on the 07/19/2015 abdominal sonogram, consider a LI-RADS category 5us lesion No new liver masses. Normal gallbladder with no cholelithiasis. No biliary ductal dilatation. Common bile duct diameter Sign mm. No choledocholithiasis. Pancreas: There is a 1.6 x 1.2 cm lobulated cystic pancreatic neck mass (series 5/ image 26) without enhancement, wall thickening or thickened internal septations, which communicates with the main pancreatic duct, previously 1.9 x 1.4 cm on 04/10/2016 and 1.5 x 1.2 cm on 07/05/2015 MRI using similar measurement technique, slightly decreased in size in the interval. Main pancreatic duct diameter 2 mm, within normal limits. No additional pancreatic lesions. No pancreas divisum. Spleen: Mild splenomegaly. Craniocaudal splenic length 13.0 cm, stable. No splenic mass. Adrenals/Urinary Tract: No discrete adrenal mass. No hydronephrosis. Simple subcentimeter renal cyst in the posterior upper left kidney. No suspicious renal masses. Stomach/Bowel: Grossly normal stomach. Visualized small and large bowel is normal caliber, with no bowel wall thickening. Vascular/Lymphatic:  Atherosclerotic nonaneurysmal abdominal aorta. Patent hepatic, portal, splenic and renal veins. There is a newly enlarged infiltrative appearing 2.2 cm lower left para-aortic node (series 904/ image 92), incompletely visualized on this study. Mildly enlarged 1.0 cm right pericardiophrenic lymph node (series 905/image 19), not appreciably changed back to the 04/13/2011 MRI, suggesting a benign reactive node. Other: No abdominal ascites or focal fluid collection. Right mastectomy. Musculoskeletal: No aggressive appearing focal osseous lesions. IMPRESSION: 1. Significant interval growth of inferior right liver lobe LI-RADS category 5 lesion (definitely hepatocellular carcinoma), now 3.8 cm and straddling segments 5 and 6 of the right liver lobe. 2. Interval stability of 1.3 cm segment 8 right liver lobe LI-RADS category 5us lesion (definitely hepatocellular carcinoma). 3. Cirrhosis.  Stable mild splenomegaly.  No ascites. 4. Partial visualization of a newly enlarged infiltrative appearing left lower para-aortic lymph node. Consider CT abdomen/pelvis with IV and oral contrast for further characterization. 5. Cystic pancreatic neck 1.6 cm mass without high risk MRI features, slightly decreased in size in the interval. Annual MRI abdomen without and with IV contrast follow-up is advised for 5 years. This recommendation follows ACR consensus guidelines: Management of Incidental Pancreatic Cysts: A White Paper of the ACR Incidental Findings Committee. J Am Coll Radiol 0814;48:185-631. 6. Aortic atherosclerosis. Electronically Signed   By: Ilona Sorrel M.D.   On: 07/19/2016 13:41   Ct Abdomen Pelvis W Contrast  Result Date: 07/21/2016 CLINICAL DATA:  f/u mesenteric abnormality seen on mri recently, Hx rt breast ca and  bladder ca dx 2001 with chemo and xrt completed 2001, hx colon ca dx 2006 with partial colectomy and ? Liver ca dx 2017, Surgery-rt. mastectomy, partial colectomy, umbilical hernia EXAM: CT ABDOMEN AND  PELVIS WITH CONTRAST TECHNIQUE: Multidetector CT imaging of the abdomen and pelvis was performed using the standard protocol following bolus administration of intravenous contrast. CONTRAST:  134mL ISOVUE-300 IOPAMIDOL (ISOVUE-300) INJECTION 61%, 157mL ISOVUE-300 IOPAMIDOL (ISOVUE-300) INJECTION 61% COMPARISON:  MRI, 07/19/2016 FINDINGS: Lower chest: Lung base interstitial thickening which appears chronic. Heart is normal in size. No acute findings. Hepatobiliary: Changes of advanced cirrhosis. Lesion in the posterior segment right lobe measures 4 x 3.2 cm transversely on CT. Smaller lesions seen on the recent MRI is not well-defined on CT. No evidence of a new liver lesion. Gallbladder is unremarkable. No bile duct dilation. Pancreas: Hypoattenuating oval circumscribed cystic mass in the pancreatic neck measuring 14 x 12 mm, without change from the MRI, allowing for differences in technique. There is generalized pancreatic atrophy with no other pancreatic masses. No pancreatic inflammation. Spleen: Mildly enlarged measuring 14 cm in greatest dimension. No splenic mass or focal lesion. Adrenals/Urinary Tract: No adrenal masses. No renal masses, stones or hydronephrosis. Ureters normal course and in caliber. Bladder is unremarkable. Stomach/Bowel: There changes from a previous partial hemicolectomy with formation of an ileocolic anastomosis. Multiple diverticula noted mostly along the sigmoid colon. No diverticulitis. No colonic wall thickening or mass. The small bowel and stomach are unremarkable. Vascular/Lymphatic: There is hazy inflammatory type opacity in the retroperitoneal fat adjacent to the inferior mesenteric artery. There is no defined mass or lymph node. There are scattered prominent periceliac and gastrohepatic ligament lymph nodes. There are scattered subcentimeter retroperitoneal lymph nodes. A 9 mm short axis node lies at the right cardiophrenic angle. Atherosclerotic calcifications are noted  throughout the abdominal aorta and its branch vessels. No aneurysm. Reproductive: Uterus and bilateral adnexa are unremarkable. Other: There is hazy opacity in fatty replacement throughout much of the mid to lower rectus abdominus muscles. No hernia. There is no ascites. Musculoskeletal: No defined osteolytic or osteoblastic lesion. IMPRESSION: 1. There is soft tissue attenuation with an infiltrative appearance in the retroperitoneum partly surrounding the inferior mesenteric artery. This is nonspecific. It appears inflammatory, and may be chronic. Consider followup assessment of this abnormality in 4-6 months with CT to assess stability. There is no defined lymph node or mass. 2. Advanced cirrhosis with hepatocellular carcinoma arising from the right lobe, better visualized on the recent liver MRI. 3. Small pancreatic cyst in the neck of the pancreas, likely a benign lesion as detailed on the recent MRI. 4. Status post partial right colectomy. No evidence of bowel obstruction or bowel inflammation. There multiple colonic diverticula mostly along the sigmoid. 5. Aortic atherosclerosis. Electronically Signed   By: Lajean Manes M.D.   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 She presents for drug-eluting bead chemoembolization prior to percutaneous thermal ablation. EXAM: IR EMBO TUMOR ORGAN ISCHEMIA INFARCT INC GUIDE ROADMAPPING; SELECTIVE VISCERAL ARTERIOGRAPHY; IR ULTRASOUND GUIDANCE VASC ACCESS RIGHT; ADDITIONAL ARTERIOGRAPHY 1. Ultrasound-guided right common femoral arterial access 2. Catheterization of the celiac artery with arteriogram 3. Catheterization of the common hepatic artery with arteriogram 4. Catheterization of the right hepatic artery with arteriogram 5. Catheterization of the segment 6 hepatic artery with arteriogram 6. Drug-eluting bead  chemoembolization of the segment 6 hepatic artery MEDICATIONS: 3.375 g Zosyn,  4 mg Zofran and 4 mg Decadron administered. The antibiotic was administered within 1 hour of the procedure ANESTHESIA/SEDATION: Moderate (conscious) sedation was employed during this procedure. A total of Versed 2 mg and Fentanyl 75 mcg was administered intravenously. Moderate Sedation Time: 71 minutes. The patient's level of consciousness and vital signs were monitored continuously by radiology nursing throughout the procedure under my direct supervision. CONTRAST:  91 mL Isovue-300 FLUOROSCOPY TIME:  Fluoroscopy Time: 12 minutes 6 seconds (2,079 mGy). COMPLICATIONS: None immediate. PROCEDURE: Informed consent was obtained from the patient following explanation of the procedure, risks, benefits and alternatives. The patient understands, agrees and consents for the procedure. All questions were addressed. A time out was performed prior to the initiation of the procedure. Maximal barrier sterile technique utilized including caps, mask, sterile gowns, sterile gloves, large sterile drape, hand hygiene, and Betadine prep. The right common femoral artery was interrogated with ultrasound and found to be widely patent. An image was obtained and stored for the medical record. Local anesthesia was attained by infiltration with 1% lidocaine. A small dermatotomy was made. Under real-time sonographic guidance, the vessel was punctured with a 21 gauge micropuncture needle. Using standard technique, the initial micro needle was exchanged over a 0.018 micro wire for a transitional 4 [place] micro sheath. The micro sheath was then exchanged over a 0.035 wire for a 5 French vascular sheath. A C2 cobra catheter was advanced over a Bentson wire into the abdominal aorta. The catheter was used to select the celiac artery. A celiac arteriogram was performed. Conventional hepatic arterial anatomy. No evidence of replaced hepatic artery. Hypervascular blushes  present in hepatic segment 6 consistent with the known hepatocellular carcinoma. There is a tiny hypervascular blush in hepatic segment 2. No definite hypervascularity in hepatic segment 8. The catheter was advanced into the common hepatic artery. Arteriography was performed in multiple obliquities. 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 Small 1 cm hypervascular lesion in the superior aspect of hepatic segment 2 which is indeterminate. No definite hypervascularity in segment 8. A high-flow Renegade microcatheter was advanced into the right hepatic artery. Arteriography was performed with similar findings as above. The segment 6 hepatic artery was successfully identified. This was then selected with the microcatheter in the microcatheter advanced distally in the artery. Arteriography was performed confirming tumor blush. A cone beam CT was also performed further confirming the location of the tumor when compared with the recent prior MRI. Chemoembolization was then performed using a single vial of 70- 150 micron LUMI beads impregnated with 75 mg doxorubicin. Chemoembolization was performed until near stasis. The catheter was then flushed and removed. Post embolization cone beam CT confirms localization of the radiopaque drug-eluting beads within the tumor. A limited right common femoral arteriogram was performed. Successful common femoral arterial access. A Cordis Exoseal extra arterial vascular plug was applied. IMPRESSION: 1. Successful drug-eluting beads chemoembolization of segment 6 hepatocellular carcinoma. 2. Nonspecific hypervascular blush measuring approximately 1 cm in the superior aspect of hepatic segment 2. Attention on follow-up imaging. 3. No definite hypervascular blush in the segment 8 lesion identified on prior MRI. Attention on follow-up imaging. PLAN: 1. Admitted for observation. Plan for discharge in the morning barring any unexpected  abnormalities. 2. Patient to be scheduled for percutaneous microwave ablation in approximately 2-4 weeks. Signed, Criselda Peaches, MD [facility] Radiology Electronically Signed   By: [name] M.D.   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 She presents for drug-eluting bead chemoembolization prior to percutaneous thermal ablation. EXAM: IR EMBO TUMOR ORGAN ISCHEMIA INFARCT INC GUIDE ROADMAPPING; SELECTIVE VISCERAL ARTERIOGRAPHY; IR ULTRASOUND GUIDANCE VASC ACCESS RIGHT; ADDITIONAL ARTERIOGRAPHY 1. Ultrasound-guided right common femoral arterial access 2. Catheterization of the celiac artery with arteriogram 3. Catheterization of the common hepatic artery with arteriogram 4. Catheterization of the right hepatic artery with arteriogram 5. Catheterization of the segment 6 hepatic artery with arteriogram 6. Drug-eluting bead chemoembolization of the segment 6 hepatic artery MEDICATIONS: 3.375 g Zosyn, 4 mg Zofran and 4 mg Decadron administered. The antibiotic was administered within 1 hour of the procedure ANESTHESIA/SEDATION: Moderate (conscious) sedation was employed during this procedure. A total of Versed 2 mg and Fentanyl 75 mcg was administered intravenously. Moderate Sedation Time: 71 minutes. The patient's level of consciousness and vital signs were monitored continuously by radiology nursing throughout the procedure under my direct supervision. CONTRAST:  91 mL Isovue-300 FLUOROSCOPY TIME:  Fluoroscopy Time: 12 minutes 6 seconds (2,079 mGy). COMPLICATIONS: None immediate. PROCEDURE: Informed consent was obtained from the patient following explanation of the procedure, risks, benefits and alternatives. The patient understands, agrees and consents for the procedure. All questions were  addressed. A time out was performed prior to the initiation of the procedure. Maximal barrier sterile technique utilized including caps, mask, sterile gowns, sterile gloves, large sterile drape, hand hygiene, and Betadine prep. The right common femoral artery was interrogated with ultrasound and found to be widely patent. An image was obtained and stored for the medical record. Local anesthesia was attained by infiltration with 1% lidocaine. A small dermatotomy was made. Under real-time sonographic guidance, the vessel was punctured with a 21 gauge micropuncture needle. Using standard technique, the initial micro needle was exchanged over a 0.018 micro wire for a transitional 4 [place] micro sheath. The micro sheath was then exchanged over a 0.035 wire for a 5 French vascular sheath. A C2 cobra catheter was advanced over a Bentson wire into the abdominal aorta. The catheter was used to select the celiac artery. A celiac arteriogram was performed. Conventional hepatic arterial anatomy. No evidence of replaced hepatic artery. Hypervascular blushes present in hepatic segment 6 consistent with the known hepatocellular carcinoma. There is a tiny hypervascular blush in hepatic segment 2. No definite hypervascularity in hepatic segment 8. The catheter was advanced into the common hepatic artery. Arteriography was performed in multiple obliquities. 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 Small 1 cm hypervascular lesion in the superior aspect of hepatic segment 2 which is indeterminate. No definite hypervascularity in segment 8. A high-flow Renegade microcatheter was advanced into the right hepatic artery. Arteriography was performed with similar findings as above. The segment 6 hepatic artery was successfully identified. This was then selected with the microcatheter in the microcatheter advanced distally in the artery. Arteriography was performed confirming tumor blush. A  cone beam CT was also performed further confirming the location of the tumor when compared with the recent prior MRI. Chemoembolization was then performed using a single vial of 70- 150 micron LUMI beads impregnated with 75 mg doxorubicin. Chemoembolization was performed until near stasis. The catheter was then flushed and removed. Post embolization cone beam CT confirms localization of the radiopaque drug-eluting beads within the tumor. A limited right common femoral arteriogram was performed. Successful common femoral arterial access. A Cordis Exoseal extra  arterial vascular plug was applied. IMPRESSION: 1. Successful drug-eluting beads chemoembolization of segment 6 hepatocellular carcinoma. 2. Nonspecific hypervascular blush measuring approximately 1 cm in the superior aspect of hepatic segment 2. Attention on follow-up imaging. 3. No definite hypervascular blush in the segment 8 lesion identified on prior MRI. Attention on follow-up imaging. PLAN: 1. Admitted for observation. Plan for discharge in the morning barring any unexpected abnormalities. 2. Patient to be scheduled for percutaneous microwave ablation in approximately 2-4 weeks. Signed, Criselda Peaches, MD [facility] Radiology Electronically Signed   By: [name] M.D.   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 She presents for drug-eluting bead chemoembolization prior to percutaneous thermal ablation. EXAM: IR EMBO TUMOR ORGAN ISCHEMIA INFARCT INC GUIDE ROADMAPPING; SELECTIVE VISCERAL ARTERIOGRAPHY; IR ULTRASOUND GUIDANCE VASC ACCESS RIGHT; ADDITIONAL ARTERIOGRAPHY 1. Ultrasound-guided right common femoral arterial access 2. Catheterization of the celiac artery with arteriogram 3. Catheterization of the common hepatic artery with  arteriogram 4. Catheterization of the right hepatic artery with arteriogram 5. Catheterization of the segment 6 hepatic artery with arteriogram 6. Drug-eluting bead chemoembolization of the segment 6 hepatic artery MEDICATIONS: 3.375 g Zosyn, 4 mg Zofran and 4 mg Decadron administered. The antibiotic was administered within 1 hour of the procedure ANESTHESIA/SEDATION: Moderate (conscious) sedation was employed during this procedure. A total of Versed 2 mg and Fentanyl 75 mcg was administered intravenously. Moderate Sedation Time: 71 minutes. The patient's level of consciousness and vital signs were monitored continuously by radiology nursing throughout the procedure under my direct supervision. CONTRAST:  91 mL Isovue-300 FLUOROSCOPY TIME:  Fluoroscopy Time: 12 minutes 6 seconds (2,079 mGy). COMPLICATIONS: None immediate. PROCEDURE: Informed consent was obtained from the patient following explanation of the procedure, risks, benefits and alternatives. The patient understands, agrees and consents for the procedure. All questions were addressed. A time out was performed prior to the initiation of the procedure. Maximal barrier sterile technique utilized including caps, mask, sterile gowns, sterile gloves, large sterile drape, hand hygiene, and Betadine prep. The right common femoral artery was interrogated with ultrasound and found to be widely patent. An image was obtained and stored for the medical record. Local anesthesia was attained by infiltration with 1% lidocaine. A small dermatotomy was made. Under real-time sonographic guidance, the vessel was punctured with a 21 gauge micropuncture needle. Using standard technique, the initial micro needle was exchanged over a 0.018 micro wire for a transitional 4 [place] micro sheath. The micro sheath was then exchanged over a 0.035 wire for a 5 French vascular sheath. A C2 cobra catheter was advanced over a Bentson wire into the abdominal aorta. The catheter was used to  select the celiac artery. A celiac arteriogram was performed. Conventional hepatic arterial anatomy. No evidence of replaced hepatic artery. Hypervascular blushes present in hepatic segment 6 consistent with the known hepatocellular carcinoma. There is a tiny hypervascular blush in hepatic segment 2. No definite hypervascularity in hepatic segment 8. The catheter was advanced into the common hepatic artery. Arteriography was performed in multiple obliquities. 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 Small 1 cm hypervascular lesion in the superior aspect of hepatic segment 2 which is indeterminate. No definite hypervascularity in segment 8. A high-flow Renegade microcatheter was advanced into the right hepatic artery. Arteriography was performed with similar findings as above. The segment 6  hepatic artery was successfully identified. This was then selected with the microcatheter in the microcatheter advanced distally in the artery. Arteriography was performed confirming tumor blush. A cone beam CT was also performed further confirming the location of the tumor when compared with the recent prior MRI. Chemoembolization was then performed using a single vial of 70- 150 micron LUMI beads impregnated with 75 mg doxorubicin. Chemoembolization was performed until near stasis. The catheter was then flushed and removed. Post embolization cone beam CT confirms localization of the radiopaque drug-eluting beads within the tumor. A limited right common femoral arteriogram was performed. Successful common femoral arterial access. A Cordis Exoseal extra arterial vascular plug was applied. IMPRESSION: 1. Successful drug-eluting beads chemoembolization of segment 6 hepatocellular carcinoma. 2. Nonspecific hypervascular blush measuring approximately 1 cm in the superior aspect of hepatic segment 2. Attention on follow-up imaging. 3. No definite hypervascular blush in the segment 8  lesion identified on prior MRI. Attention on follow-up imaging. PLAN: 1. Admitted for observation. Plan for discharge in the morning barring any unexpected abnormalities. 2. Patient to be scheduled for percutaneous microwave ablation in approximately 2-4 weeks. Signed, Criselda Peaches, MD [facility] Radiology Electronically Signed   By: [name] M.D.   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 She presents for drug-eluting bead chemoembolization prior to percutaneous thermal ablation. EXAM: IR EMBO TUMOR ORGAN ISCHEMIA INFARCT INC GUIDE ROADMAPPING; SELECTIVE VISCERAL ARTERIOGRAPHY; IR ULTRASOUND GUIDANCE VASC ACCESS RIGHT; ADDITIONAL ARTERIOGRAPHY 1. Ultrasound-guided right common femoral arterial access 2. Catheterization of the celiac artery with arteriogram 3. Catheterization of the common hepatic artery with arteriogram 4. Catheterization of the right hepatic artery with arteriogram 5. Catheterization of the segment 6 hepatic artery with arteriogram 6. Drug-eluting bead chemoembolization of the segment 6 hepatic artery MEDICATIONS: 3.375 g Zosyn, 4 mg Zofran and 4 mg Decadron administered. The antibiotic was administered within 1 hour of the procedure ANESTHESIA/SEDATION: Moderate (conscious) sedation was employed during this procedure. A total of Versed 2 mg and Fentanyl 75 mcg was administered intravenously. Moderate Sedation Time: 71 minutes. The patient's level of consciousness and vital signs were monitored continuously by radiology nursing throughout the procedure under my direct supervision. CONTRAST:  91 mL Isovue-300 FLUOROSCOPY TIME:  Fluoroscopy Time: 12 minutes 6 seconds (2,079 mGy). COMPLICATIONS: None immediate. PROCEDURE: Informed consent was obtained from the patient following explanation of the  procedure, risks, benefits and alternatives. The patient understands, agrees and consents for the procedure. All questions were addressed. A time out was performed prior to the initiation of the procedure. Maximal barrier sterile technique utilized including caps, mask, sterile gowns, sterile gloves, large sterile drape, hand hygiene, and Betadine prep. The right common femoral artery was interrogated with ultrasound and found to be widely patent. An image was obtained and stored for the medical record. Local anesthesia was attained by infiltration with 1% lidocaine. A small dermatotomy was made. Under real-time sonographic guidance, the vessel was punctured with a 21 gauge micropuncture needle. Using standard technique, the initial micro needle was exchanged over a 0.018 micro wire for a transitional 4 [place] micro sheath. The micro sheath was then exchanged over a 0.035 wire for a 5 French vascular sheath. A C2 cobra catheter was advanced over a Bentson wire into the abdominal aorta. The catheter was used to select the celiac artery. A celiac arteriogram was performed. Conventional hepatic  arterial anatomy. No evidence of replaced hepatic artery. Hypervascular blushes present in hepatic segment 6 consistent with the known hepatocellular carcinoma. There is a tiny hypervascular blush in hepatic segment 2. No definite hypervascularity in hepatic segment 8. The catheter was advanced into the common hepatic artery. Arteriography was performed in multiple obliquities. 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 Small 1 cm hypervascular lesion in the superior aspect of hepatic segment 2 which is indeterminate. No definite hypervascularity in segment 8. A high-flow Renegade microcatheter was advanced into the right hepatic artery. Arteriography was performed with similar findings as above. The segment 6 hepatic artery was successfully identified. This was then selected  with the microcatheter in the microcatheter advanced distally in the artery. Arteriography was performed confirming tumor blush. A cone beam CT was also performed further confirming the location of the tumor when compared with the recent prior MRI. Chemoembolization was then performed using a single vial of 70- 150 micron LUMI beads impregnated with 75 mg doxorubicin. Chemoembolization was performed until near stasis. The catheter was then flushed and removed. Post embolization cone beam CT confirms localization of the radiopaque drug-eluting beads within the tumor. A limited right common femoral arteriogram was performed. Successful common femoral arterial access. A Cordis Exoseal extra arterial vascular plug was applied. IMPRESSION: 1. Successful drug-eluting beads chemoembolization of segment 6 hepatocellular carcinoma. 2. Nonspecific hypervascular blush measuring approximately 1 cm in the superior aspect of hepatic segment 2. Attention on follow-up imaging. 3. No definite hypervascular blush in the segment 8 lesion identified on prior MRI. Attention on follow-up imaging. PLAN: 1. Admitted for observation. Plan for discharge in the morning barring any unexpected abnormalities. 2. Patient to be scheduled for percutaneous microwave ablation in approximately 2-4 weeks. Signed, Criselda Peaches, MD [facility] Radiology Electronically Signed   By: [name] M.D.   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 She presents for drug-eluting bead chemoembolization prior to percutaneous thermal ablation. EXAM: IR EMBO TUMOR ORGAN ISCHEMIA INFARCT INC GUIDE ROADMAPPING; SELECTIVE VISCERAL ARTERIOGRAPHY; IR ULTRASOUND GUIDANCE VASC ACCESS RIGHT; ADDITIONAL ARTERIOGRAPHY 1. Ultrasound-guided right common femoral arterial  access 2. Catheterization of the celiac artery with arteriogram 3. Catheterization of the common hepatic artery with arteriogram 4. Catheterization of the right hepatic artery with arteriogram 5. Catheterization of the segment 6 hepatic artery with arteriogram 6. Drug-eluting bead chemoembolization of the segment 6 hepatic artery MEDICATIONS: 3.375 g Zosyn, 4 mg Zofran and 4 mg Decadron administered. The antibiotic was administered within 1 hour of the procedure ANESTHESIA/SEDATION: Moderate (conscious) sedation was employed during this procedure. A total of Versed 2 mg and Fentanyl 75 mcg was administered intravenously. Moderate Sedation Time: 71 minutes. The patient's level of consciousness and vital signs were monitored continuously by radiology nursing throughout the procedure under my direct supervision. CONTRAST:  91 mL Isovue-300 FLUOROSCOPY TIME:  Fluoroscopy Time: 12 minutes 6 seconds (2,079 mGy). COMPLICATIONS: None immediate. PROCEDURE: Informed consent was obtained from the patient following explanation of the procedure, risks, benefits and alternatives. The patient understands, agrees and consents for the procedure. All questions were addressed. A time out was performed prior to the initiation of the procedure. Maximal barrier sterile technique utilized including caps, mask, sterile gowns, sterile gloves, large sterile drape, hand hygiene, and Betadine prep. The right common femoral artery was interrogated with ultrasound  and found to be widely patent. An image was obtained and stored for the medical record. Local anesthesia was attained by infiltration with 1% lidocaine. A small dermatotomy was made. Under real-time sonographic guidance, the vessel was punctured with a 21 gauge micropuncture needle. Using standard technique, the initial micro needle was exchanged over a 0.018 micro wire for a transitional 4 [place] micro sheath. The micro sheath was then exchanged over a 0.035 wire for a 5 French  vascular sheath. A C2 cobra catheter was advanced over a Bentson wire into the abdominal aorta. The catheter was used to select the celiac artery. A celiac arteriogram was performed. Conventional hepatic arterial anatomy. No evidence of replaced hepatic artery. Hypervascular blushes present in hepatic segment 6 consistent with the known hepatocellular carcinoma. There is a tiny hypervascular blush in hepatic segment 2. No definite hypervascularity in hepatic segment 8. The catheter was advanced into the common hepatic artery. Arteriography was performed in multiple obliquities. 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 Small 1 cm hypervascular lesion in the superior aspect of hepatic segment 2 which is indeterminate. No definite hypervascularity in segment 8. A high-flow Renegade microcatheter was advanced into the right hepatic artery. Arteriography was performed with similar findings as above. The segment 6 hepatic artery was successfully identified. This was then selected with the microcatheter in the microcatheter advanced distally in the artery. Arteriography was performed confirming tumor blush. A cone beam CT was also performed further confirming the location of the tumor when compared with the recent prior MRI. Chemoembolization was then performed using a single vial of 70- 150 micron LUMI beads impregnated with 75 mg doxorubicin. Chemoembolization was performed until near stasis. The catheter was then flushed and removed. Post embolization cone beam CT confirms localization of the radiopaque drug-eluting beads within the tumor. A limited right common femoral arteriogram was performed. Successful common femoral arterial access. A Cordis Exoseal extra arterial vascular plug was applied. IMPRESSION: 1. Successful drug-eluting beads chemoembolization of segment 6 hepatocellular carcinoma. 2. Nonspecific hypervascular blush measuring approximately 1 cm in the  superior aspect of hepatic segment 2. Attention on follow-up imaging. 3. No definite hypervascular blush in the segment 8 lesion identified on prior MRI. Attention on follow-up imaging. PLAN: 1. Admitted for observation. Plan for discharge in the morning barring any unexpected abnormalities. 2. Patient to be scheduled for percutaneous microwave ablation in approximately 2-4 weeks. Signed, Criselda Peaches, MD [facility] Radiology Electronically Signed   By: [name] M.D.   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 She presents for drug-eluting bead chemoembolization prior to percutaneous thermal ablation. EXAM: IR EMBO TUMOR ORGAN ISCHEMIA INFARCT INC GUIDE ROADMAPPING; SELECTIVE VISCERAL ARTERIOGRAPHY; IR ULTRASOUND GUIDANCE VASC ACCESS RIGHT; ADDITIONAL ARTERIOGRAPHY 1. Ultrasound-guided right common femoral arterial access 2. Catheterization of the celiac artery with arteriogram 3. Catheterization of the common hepatic artery with arteriogram 4. Catheterization of the right hepatic artery with arteriogram 5. Catheterization of the segment 6 hepatic artery with arteriogram 6. Drug-eluting bead chemoembolization of the segment 6 hepatic artery MEDICATIONS: 3.375 g Zosyn, 4 mg Zofran and 4 mg Decadron administered. The antibiotic was administered within 1 hour of the procedure ANESTHESIA/SEDATION: Moderate (conscious) sedation was employed during this procedure. A total of Versed 2 mg and Fentanyl 75 mcg was administered intravenously. Moderate Sedation  Time: 71 minutes. The patient's level of consciousness and vital signs were monitored continuously by radiology nursing throughout the procedure under my direct supervision. CONTRAST:  91 mL Isovue-300 FLUOROSCOPY TIME:  Fluoroscopy  Time: 12 minutes 6 seconds (2,079 mGy). COMPLICATIONS: None immediate. PROCEDURE: Informed consent was obtained from the patient following explanation of the procedure, risks, benefits and alternatives. The patient understands, agrees and consents for the procedure. All questions were addressed. A time out was performed prior to the initiation of the procedure. Maximal barrier sterile technique utilized including caps, mask, sterile gowns, sterile gloves, large sterile drape, hand hygiene, and Betadine prep. The right common femoral artery was interrogated with ultrasound and found to be widely patent. An image was obtained and stored for the medical record. Local anesthesia was attained by infiltration with 1% lidocaine. A small dermatotomy was made. Under real-time sonographic guidance, the vessel was punctured with a 21 gauge micropuncture needle. Using standard technique, the initial micro needle was exchanged over a 0.018 micro wire for a transitional 4 [place] micro sheath. The micro sheath was then exchanged over a 0.035 wire for a 5 French vascular sheath. A C2 cobra catheter was advanced over a Bentson wire into the abdominal aorta. The catheter was used to select the celiac artery. A celiac arteriogram was performed. Conventional hepatic arterial anatomy. No evidence of replaced hepatic artery. Hypervascular blushes present in hepatic segment 6 consistent with the known hepatocellular carcinoma. There is a tiny hypervascular blush in hepatic segment 2. No definite hypervascularity in hepatic segment 8. The catheter was advanced into the common hepatic artery. Arteriography was performed in multiple obliquities. 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 Small 1 cm hypervascular lesion in the superior aspect of hepatic segment 2 which is indeterminate. No definite hypervascularity in segment 8. A high-flow Renegade microcatheter was advanced into the right  hepatic artery. Arteriography was performed with similar findings as above. The segment 6 hepatic artery was successfully identified. This was then selected with the microcatheter in the microcatheter advanced distally in the artery. Arteriography was performed confirming tumor blush. A cone beam CT was also performed further confirming the location of the tumor when compared with the recent prior MRI. Chemoembolization was then performed using a single vial of 70- 150 micron LUMI beads impregnated with 75 mg doxorubicin. Chemoembolization was performed until near stasis. The catheter was then flushed and removed. Post embolization cone beam CT confirms localization of the radiopaque drug-eluting beads within the tumor. A limited right common femoral arteriogram was performed. Successful common femoral arterial access. A Cordis Exoseal extra arterial vascular plug was applied. IMPRESSION: 1. Successful drug-eluting beads chemoembolization of segment 6 hepatocellular carcinoma. 2. Nonspecific hypervascular blush measuring approximately 1 cm in the superior aspect of hepatic segment 2. Attention on follow-up imaging. 3. No definite hypervascular blush in the segment 8 lesion identified on prior MRI. Attention on follow-up imaging. PLAN: 1. Admitted for observation. Plan for discharge in the morning barring any unexpected abnormalities. 2. Patient to be scheduled for percutaneous microwave ablation in approximately 2-4 weeks. Signed, Criselda Peaches, MD [facility]iology [facility] Radiology Electronically Signed   By: [name] M.D.   On: 08/01/2016 17:26    Labs:  CBC:  Recent Labs  07/04/16 1746 07/10/16 2111 08/01/16 1154 08/02/16 0506  WBC 4.3 4.2 5.6 4.5  HGB 9.1* 10.1* 10.5* 9.0*  HCT 29.0* 31.9* 33.3* 27.7*  PLT PLATELET CLUMPS NOTED ON SMEAR, UNABLE TO  ESTIMATE 179 193 130*    COAGS:  Recent Labs  04/11/16 1457 05/04/16 1129 06/15/16 1201  07/04/16 1830 07/11/16 0638 08/01/16 1154  INR 1.29 1.23 1.3* 1.22 1.2* 1.18  APTT 39* 43*  --  36  --   --     BMP:  Recent Labs  07/04/16 1746 07/10/16 2223 08/01/16 1154 08/02/16 0506  NA 139 142 140 137  K 3.3* 3.8 3.3* 3.8  CL 102 101 103 104  CO2 23 26 24 24   GLUCOSE 90 92 70 217*  BUN 13 12 13 14   CALCIUM 9.0 9.8 9.7 8.5*  CREATININE 1.09* 0.92 1.14* 1.14*  GFRNONAA 48* 61 46* 46*  GFRAA 56* 70 53* 53*    LIVER FUNCTION TESTS:  Recent Labs  07/04/16 1746 07/10/16 2223 08/01/16 1154 08/02/16 0506  BILITOT 0.8 0.7 1.0 1.1  AST 43* 31 50* 61*  ALT 19 15 22 25   ALKPHOS 102 136* 125 96  PROT 7.1 7.0 8.8* 7.0  ALBUMIN 3.4* 3.6 3.8 2.9*    TUMOR MARKERS:  Recent Labs  06/15/16 1201  AFPTM 5.5    Assessment:  Hepatocellular carcinoma  S/P DEB-TACE on 08/01/2016 and scheduled for MWA 09/01/2016  N/V, weight loss, decreased appetite.  Dr. Laurence Ferrari saw patient today.  Will obtain CBC w/ Diff, CMP, and PT/INR.  She is instructed to take her Zofran in the morning and evening and possibly at [facility] time as well to avoid N/V.  Will prescribe Reglan as well and hopefully this will help with reflux symptoms and appetite as well.  Her family understands if she is not doing better the week the MWA is scheduled, they are to call and let us know.   We will either have her admitted for IVFs and tee her up for the procedure or we may even need to postpone the procedure.   Electronically Signed: Murrell Redden PA-C 08/17/2016, 3:51 PM   Please refer to Dr. Katrinka Blazing attestation of this note for management and plan.

## 2016-08-18 LAB — COMPLETE METABOLIC PANEL WITH GFR
ALBUMIN: 3.3 g/dL — AB (ref 3.6–5.1)
ALK PHOS: 126 U/L (ref 33–130)
ALT: 11 U/L (ref 6–29)
AST: 25 U/L (ref 10–35)
BILIRUBIN TOTAL: 1 mg/dL (ref 0.2–1.2)
BUN: 16 mg/dL (ref 7–25)
CALCIUM: 9.1 mg/dL (ref 8.6–10.4)
CO2: 23 mmol/L (ref 20–31)
CREATININE: 1.34 mg/dL — AB (ref 0.60–0.93)
Chloride: 99 mmol/L (ref 98–110)
GFR, Est African American: 45 mL/min — ABNORMAL LOW (ref >=60)
GFR, Est Non African American: 39 mL/min — ABNORMAL LOW (ref >=60)
Glucose, Bld: 118 mg/dL — ABNORMAL HIGH (ref 65–99)
Potassium: 4 mmol/L (ref 3.5–5.3)
Sodium: 139 mmol/L (ref 135–146)
Total Protein: 7.2 g/dL (ref 6.1–8.1)

## 2016-08-18 LAB — PROTIME-INR
INR: 1.3 — ABNORMAL HIGH
PROTHROMBIN TIME: 13.8 s — AB (ref 9.0–11.5)

## 2016-08-19 ENCOUNTER — Emergency Department (HOSPITAL_COMMUNITY): Payer: Medicare Other

## 2016-08-19 ENCOUNTER — Encounter (HOSPITAL_COMMUNITY): Payer: Self-pay

## 2016-08-19 ENCOUNTER — Inpatient Hospital Stay (HOSPITAL_COMMUNITY)
Admission: EM | Admit: 2016-08-19 | Discharge: 2016-08-22 | DRG: 683 | Disposition: A | Discharge status: Home Health | Payer: Medicare Other | Attending: Internal Medicine | Admitting: Internal Medicine

## 2016-08-19 DIAGNOSIS — K580 Irritable bowel syndrome with diarrhea: Secondary | ICD-10-CM | POA: Diagnosis present

## 2016-08-19 DIAGNOSIS — M1990 Unspecified osteoarthritis, unspecified site: Secondary | ICD-10-CM | POA: Diagnosis present

## 2016-08-19 DIAGNOSIS — E785 Hyperlipidemia, unspecified: Secondary | ICD-10-CM | POA: Diagnosis present

## 2016-08-19 DIAGNOSIS — E119 Type 2 diabetes mellitus without complications: Secondary | ICD-10-CM | POA: Diagnosis present

## 2016-08-19 DIAGNOSIS — Z9011 Acquired absence of right breast and nipple: Secondary | ICD-10-CM

## 2016-08-19 DIAGNOSIS — K2270 Barrett's esophagus without dysplasia: Secondary | ICD-10-CM | POA: Diagnosis present

## 2016-08-19 DIAGNOSIS — N179 Acute kidney failure, unspecified: Principal | ICD-10-CM | POA: Diagnosis present

## 2016-08-19 DIAGNOSIS — Z8505 Personal history of malignant neoplasm of liver: Secondary | ICD-10-CM

## 2016-08-19 DIAGNOSIS — K7469 Other cirrhosis of liver: Secondary | ICD-10-CM | POA: Diagnosis present

## 2016-08-19 DIAGNOSIS — R509 Fever, unspecified: Secondary | ICD-10-CM | POA: Diagnosis not present

## 2016-08-19 DIAGNOSIS — I10 Essential (primary) hypertension: Secondary | ICD-10-CM | POA: Diagnosis present

## 2016-08-19 DIAGNOSIS — K219 Gastro-esophageal reflux disease without esophagitis: Secondary | ICD-10-CM | POA: Diagnosis present

## 2016-08-19 DIAGNOSIS — E872 Acidosis: Secondary | ICD-10-CM | POA: Diagnosis present

## 2016-08-19 DIAGNOSIS — Z803 Family history of malignant neoplasm of breast: Secondary | ICD-10-CM

## 2016-08-19 DIAGNOSIS — E782 Mixed hyperlipidemia: Secondary | ICD-10-CM | POA: Diagnosis present

## 2016-08-19 DIAGNOSIS — Z853 Personal history of malignant neoplasm of breast: Secondary | ICD-10-CM

## 2016-08-19 DIAGNOSIS — Z833 Family history of diabetes mellitus: Secondary | ICD-10-CM

## 2016-08-19 DIAGNOSIS — C220 Liver cell carcinoma: Secondary | ICD-10-CM | POA: Diagnosis present

## 2016-08-19 DIAGNOSIS — E860 Dehydration: Secondary | ICD-10-CM | POA: Diagnosis not present

## 2016-08-19 DIAGNOSIS — Z85038 Personal history of other malignant neoplasm of large intestine: Secondary | ICD-10-CM

## 2016-08-19 DIAGNOSIS — Z79899 Other long term (current) drug therapy: Secondary | ICD-10-CM

## 2016-08-19 DIAGNOSIS — K7581 Nonalcoholic steatohepatitis (NASH): Secondary | ICD-10-CM | POA: Diagnosis present

## 2016-08-19 DIAGNOSIS — Z794 Long term (current) use of insulin: Secondary | ICD-10-CM

## 2016-08-19 DIAGNOSIS — I959 Hypotension, unspecified: Secondary | ICD-10-CM | POA: Diagnosis not present

## 2016-08-19 DIAGNOSIS — D649 Anemia, unspecified: Secondary | ICD-10-CM | POA: Diagnosis present

## 2016-08-19 DIAGNOSIS — E1169 Type 2 diabetes mellitus with other specified complication: Secondary | ICD-10-CM | POA: Diagnosis present

## 2016-08-19 DIAGNOSIS — R531 Weakness: Secondary | ICD-10-CM | POA: Diagnosis not present

## 2016-08-19 DIAGNOSIS — R000 Tachycardia, unspecified: Secondary | ICD-10-CM | POA: Diagnosis not present

## 2016-08-19 DIAGNOSIS — E669 Obesity, unspecified: Secondary | ICD-10-CM | POA: Diagnosis present

## 2016-08-19 DIAGNOSIS — R6510 Systemic inflammatory response syndrome (SIRS) of non-infectious origin without acute organ dysfunction: Secondary | ICD-10-CM | POA: Diagnosis present

## 2016-08-19 DIAGNOSIS — Z7982 Long term (current) use of aspirin: Secondary | ICD-10-CM

## 2016-08-19 DIAGNOSIS — D509 Iron deficiency anemia, unspecified: Secondary | ICD-10-CM | POA: Diagnosis present

## 2016-08-19 DIAGNOSIS — E876 Hypokalemia: Secondary | ICD-10-CM

## 2016-08-19 DIAGNOSIS — Z8551 Personal history of malignant neoplasm of bladder: Secondary | ICD-10-CM

## 2016-08-19 DIAGNOSIS — R197 Diarrhea, unspecified: Secondary | ICD-10-CM

## 2016-08-19 DIAGNOSIS — J302 Other seasonal allergic rhinitis: Secondary | ICD-10-CM | POA: Diagnosis present

## 2016-08-19 DIAGNOSIS — Z87891 Personal history of nicotine dependence: Secondary | ICD-10-CM

## 2016-08-19 DIAGNOSIS — G4730 Sleep apnea, unspecified: Secondary | ICD-10-CM | POA: Diagnosis present

## 2016-08-19 LAB — APTT: aPTT: 36 seconds (ref 24–36)

## 2016-08-19 LAB — COMPREHENSIVE METABOLIC PANEL
ALT: 14 U/L (ref 14–54)
AST: 32 U/L (ref 15–41)
Albumin: 3 g/dL — ABNORMAL LOW (ref 3.5–5.0)
Alkaline Phosphatase: 108 U/L (ref 38–126)
Anion gap: 14 (ref 5–15)
BUN: 22 mg/dL — ABNORMAL HIGH (ref 6–20)
CO2: 23 mmol/L (ref 22–32)
Calcium: 8.5 mg/dL — ABNORMAL LOW (ref 8.9–10.3)
Chloride: 100 mmol/L — ABNORMAL LOW (ref 101–111)
Creatinine, Ser: 1.47 mg/dL — ABNORMAL HIGH (ref 0.44–1.00)
GFR calc Af Amer: 39 mL/min — ABNORMAL LOW (ref >60)
GFR calc non Af Amer: 34 mL/min — ABNORMAL LOW (ref >60)
Glucose, Bld: 111 mg/dL — ABNORMAL HIGH (ref 65–99)
Potassium: 3.7 mmol/L (ref 3.5–5.1)
Sodium: 137 mmol/L (ref 135–145)
Total Bilirubin: 1 mg/dL (ref 0.3–1.2)
Total Protein: 7.7 g/dL (ref 6.5–8.1)

## 2016-08-19 LAB — CBC WITH DIFFERENTIAL/PLATELET
Basophils Absolute: 0 10*3/uL (ref 0.0–0.1)
Basophils Relative: 0 %
Eosinophils Absolute: 0.1 10*3/uL (ref 0.0–0.7)
Eosinophils Relative: 1 %
HCT: 30.1 % — ABNORMAL LOW (ref 36.0–46.0)
Hemoglobin: 9.6 g/dL — ABNORMAL LOW (ref 12.0–15.0)
Lymphocytes Relative: 13 %
Lymphs Abs: 1.1 10*3/uL (ref 0.7–4.0)
MCH: 27.3 pg (ref 26.0–34.0)
MCHC: 31.9 g/dL (ref 30.0–36.0)
MCV: 85.5 fL (ref 78.0–100.0)
Monocytes Absolute: 1.1 10*3/uL — ABNORMAL HIGH (ref 0.1–1.0)
Monocytes Relative: 13 %
Neutro Abs: 5.8 10*3/uL (ref 1.7–7.7)
Neutrophils Relative %: 73 %
Platelets: 238 10*3/uL (ref 150–400)
RBC: 3.52 MIL/uL — ABNORMAL LOW (ref 3.87–5.11)
RDW: 17.6 % — ABNORMAL HIGH (ref 11.5–15.5)
WBC: 8.1 10*3/uL (ref 4.0–10.5)

## 2016-08-19 LAB — INFLUENZA PANEL BY PCR (TYPE A & B)
Influenza A By PCR: NEGATIVE
Influenza B By PCR: NEGATIVE

## 2016-08-19 LAB — LACTIC ACID, PLASMA
LACTIC ACID, VENOUS: 2.5 mmol/L — AB (ref 0.5–1.9)
LACTIC ACID, VENOUS: 3.1 mmol/L — AB (ref 0.5–1.9)

## 2016-08-19 LAB — GLUCOSE, CAPILLARY
Glucose-Capillary: 72 mg/dL (ref 65–99)
Glucose-Capillary: 145 mg/dL — ABNORMAL HIGH (ref 65–99)

## 2016-08-19 LAB — PROTIME-INR
INR: 1.42
PROTHROMBIN TIME: 17.5 s — AB (ref 11.4–15.2)

## 2016-08-19 LAB — I-STAT CG4 LACTIC ACID, ED
Lactic Acid, Venous: 4.34 mmol/L (ref 0.5–1.9)
Lactic Acid, Venous: 3.51 mmol/L (ref 0.5–1.9)

## 2016-08-19 LAB — URINALYSIS, ROUTINE W REFLEX MICROSCOPIC
Bilirubin Urine: NEGATIVE
Glucose, UA: NEGATIVE mg/dL
Hgb urine dipstick: NEGATIVE
Ketones, ur: NEGATIVE mg/dL
Leukocytes, UA: NEGATIVE
Nitrite: NEGATIVE
Protein, ur: NEGATIVE mg/dL
Specific Gravity, Urine: 1.012 (ref 1.005–1.030)
pH: 5 (ref 5.0–8.0)

## 2016-08-19 LAB — MAGNESIUM: MAGNESIUM: 1 mg/dL — AB (ref 1.7–2.4)

## 2016-08-19 LAB — PHOSPHORUS: Phosphorus: 2.6 mg/dL (ref 2.5–4.6)

## 2016-08-19 MED ORDER — SODIUM CHLORIDE 0.9 % IV BOLUS (SEPSIS)
1000.0000 mL | Freq: Once | INTRAVENOUS | Status: AC
  Administered 2016-08-19: 1000 mL via INTRAVENOUS

## 2016-08-19 MED ORDER — MECLIZINE HCL 25 MG PO TABS
25.0000 mg | ORAL_TABLET | Freq: Three times a day (TID) | ORAL | Status: DC | PRN
  Filled 2016-08-19: qty 1

## 2016-08-19 MED ORDER — METOPROLOL TARTRATE 25 MG PO TABS
25.0000 mg | ORAL_TABLET | Freq: Two times a day (BID) | ORAL | Status: DC
  Administered 2016-08-19 – 2016-08-22 (×3): 25 mg via ORAL
  Filled 2016-08-19 (×6): qty 1

## 2016-08-19 MED ORDER — MAGNESIUM SULFATE 4 GM/100ML IV SOLN
4.0000 g | Freq: Once | INTRAVENOUS | Status: AC
  Administered 2016-08-19: 4 g via INTRAVENOUS
  Filled 2016-08-19: qty 100

## 2016-08-19 MED ORDER — INSULIN ASPART 100 UNIT/ML ~~LOC~~ SOLN
0.0000 [IU] | Freq: Three times a day (TID) | SUBCUTANEOUS | Status: DC
  Administered 2016-08-20: 2 [IU] via SUBCUTANEOUS
  Administered 2016-08-21 – 2016-08-22 (×2): 3 [IU] via SUBCUTANEOUS

## 2016-08-19 MED ORDER — INSULIN NPH (HUMAN) (ISOPHANE) 100 UNIT/ML ~~LOC~~ SUSP
30.0000 [IU] | Freq: Every day | SUBCUTANEOUS | Status: DC
  Administered 2016-08-20 – 2016-08-22 (×3): 30 [IU] via SUBCUTANEOUS
  Filled 2016-08-19: qty 10

## 2016-08-19 MED ORDER — HEPARIN SODIUM (PORCINE) 5000 UNIT/ML IJ SOLN
5000.0000 [IU] | Freq: Three times a day (TID) | INTRAMUSCULAR | Status: DC
  Administered 2016-08-19 – 2016-08-22 (×9): 5000 [IU] via SUBCUTANEOUS
  Filled 2016-08-19 (×9): qty 1

## 2016-08-19 MED ORDER — TRAMADOL HCL 50 MG PO TABS: 50.0000 mg | ORAL_TABLET | Freq: Four times a day (QID) | ORAL | Status: DC | PRN

## 2016-08-19 MED ORDER — POTASSIUM CHLORIDE IN NACL 20-0.9 MEQ/L-% IV SOLN
INTRAVENOUS | Status: DC
  Administered 2016-08-19 (×2): 100 mL/h via INTRAVENOUS
  Administered 2016-08-20: 16:00:00 via INTRAVENOUS
  Administered 2016-08-20: 100 mL/h via INTRAVENOUS
  Administered 2016-08-21: 13:00:00 via INTRAVENOUS
  Filled 2016-08-19 (×5): qty 1000

## 2016-08-19 MED ORDER — INSULIN NPH (HUMAN) (ISOPHANE) 100 UNIT/ML ~~LOC~~ SUSP
40.0000 [IU] | Freq: Every day | SUBCUTANEOUS | Status: DC
  Administered 2016-08-19 – 2016-08-21 (×3): 40 [IU] via SUBCUTANEOUS
  Filled 2016-08-19: qty 10

## 2016-08-19 MED ORDER — SODIUM CHLORIDE 0.9 % IV BOLUS (SEPSIS)
1000.0000 mL | Freq: Once | INTRAVENOUS | Status: AC
Start: 2016-08-20 — End: 2016-08-20
  Administered 2016-08-20: 1000 mL via INTRAVENOUS

## 2016-08-19 MED ORDER — CALCIUM CARBONATE-VITAMIN D 500-200 MG-UNIT PO TABS
1.0000 | ORAL_TABLET | Freq: Every day | ORAL | Status: DC
  Administered 2016-08-20 – 2016-08-22 (×3): 1 via ORAL
  Filled 2016-08-19 (×3): qty 1

## 2016-08-19 MED ORDER — ONDANSETRON HCL 4 MG PO TABS: 4.0000 mg | ORAL_TABLET | Freq: Four times a day (QID) | ORAL | Status: DC | PRN

## 2016-08-19 MED ORDER — ASPIRIN EC 81 MG PO TBEC
81.0000 mg | DELAYED_RELEASE_TABLET | Freq: Every day | ORAL | Status: DC
  Administered 2016-08-20 – 2016-08-22 (×3): 81 mg via ORAL
  Filled 2016-08-19 (×3): qty 1

## 2016-08-19 MED ORDER — DIPHENOXYLATE-ATROPINE 2.5-0.025 MG PO TABS: 2.0000 | ORAL_TABLET | Freq: Four times a day (QID) | ORAL | Status: DC | PRN

## 2016-08-19 MED ORDER — ONDANSETRON HCL 4 MG/2ML IJ SOLN
4.0000 mg | Freq: Four times a day (QID) | INTRAMUSCULAR | Status: DC | PRN
  Administered 2016-08-20: 4 mg via INTRAVENOUS
  Filled 2016-08-19: qty 2

## 2016-08-19 MED ORDER — PANTOPRAZOLE SODIUM 40 MG PO TBEC
40.0000 mg | DELAYED_RELEASE_TABLET | Freq: Every day | ORAL | Status: DC
  Administered 2016-08-19 – 2016-08-21 (×3): 40 mg via ORAL
  Filled 2016-08-19 (×3): qty 1

## 2016-08-19 MED ORDER — METOCLOPRAMIDE HCL 5 MG PO TABS
5.0000 mg | ORAL_TABLET | Freq: Two times a day (BID) | ORAL | Status: DC
  Administered 2016-08-19 – 2016-08-22 (×6): 5 mg via ORAL
  Filled 2016-08-19 (×5): qty 1

## 2016-08-19 NOTE — ED Notes (Signed)
MD REES  aware of I-STAT values

## 2016-08-19 NOTE — ED Notes (Signed)
Ambulated to bathroom with assistance  

## 2016-08-19 NOTE — ED Notes (Signed)
Called unit to see if the bed ready. Waiting on nurse to approve bed.

## 2016-08-19 NOTE — H&P (Signed)
History and Physical    [name] [name] UXN:[id number] DOB: Feb 27, 1941 DOA: 08/19/2016  PCP: Alysia Penna, MD   Patient coming from: Home.  I have personally briefly reviewed patient's old medical records in [place]  Chief Complaint: Fever and weakness.  HPI: [name] [name] is a 76 y.o. female with medical history significant of breast cancer, seasonal allergies, osteoarthritis, (history of breast cancer, bladder cancer, colon cancer), hepatocellular carcinoma, Barrett's esophagus, GERD, gastritis, hemorrhoids, hepatic cirrhosis, IBS, type 2 diabetes, hyperlipidemia, hypertension, iron deficiency anemia, sleep apnea who is coming to the emergency department with a week history of progressively worse weakness, decreased appetite, low-grade fever fever and malaise. She denies headache, sore throat, productive cough, chest pain, palpitations, PND, orthopnea or lower extremities edema. Patient states that she has feels lightheaded and nauseous at times.  ED Course: The patient received normal saline 2000 mL bolus in the emergency department. Her UA was negative for signs of infection, WBC 8.1, hemoglobin 9.6, platelets 238. Potassium 3.7, sodium 137, chloride 106, bicarbonate 23 and lactic acid was 4.34 mmol/L. BUN 22 creatinine 1.47, glucose 111, magnesium 1.0 phosphorus 2.6 mg/dL. Her chest radiograph did not show any acute cardiopulmonary pathology. Please see full radiology report for further detail.  Review of Systems: As per HPI otherwise 10 point review of systems negative.    Past Medical History:  Diagnosis Date  . Adenocarcinoma, breast [facility])    sees Dr. Marcy Panning   . Allergy    environmental  . Arthritis    sees Dr. Hurley Cisco   . Barrett esophagus 12/20/11  . Bladder cancer ([place])   . Breast cancer ([place])   . Cancer, hepatocellular ([place])   . Dementia    mild  . Diabetes mellitus    sees Dr. Chalmers Cater   . Diverticulosis of colon (without mention of hemorrhage)  09/26/2004,09/10/2006  . Gastritis   . GERD (gastroesophageal reflux disease)   . Hemorrhoids   . Hepatic cirrhosis ([place])   . Hepatic steatosis   . Hiatal hernia   . Hyperglycemia   . Hyperlipidemia   . Hypertension   . IBS (irritable bowel syndrome)   . Inflammatory polyps of colon ([place])   . Iron deficiency anemia   . Malignant neoplasm of colon, unspecified site 07/08/2003  . Personal history of chemotherapy   . Personal history of radiation therapy   . Sleep apnea    sees Dr. Gwenette Greet   . Transaminase or LDH elevation    sees Dr. Zenovia Jarred     Past Surgical History:  Procedure Laterality Date  . bladder cancer 2001    . BREAST BIOPSY    . BREAST SURGERY  2001   lymph nodes removed and rt mastectomy  . CARPAL TUNNEL RELEASE    . [place]   for diverticulitis  . COLONOSCOPY    . ESOPHAGEAL DILATION    . incisonal hernia    . IR GENERIC HISTORICAL  04/25/2016   IR RADIOLOGIST EVAL & MGMT 04/25/2016 [name], MD [facility] INTERV RAD  . IR GENERIC HISTORICAL  08/01/2016   IR 3D INDEPENDENT WKST 08/01/2016 [facility]  . IR GENERIC HISTORICAL  08/01/2016   IR ANGIOGRAM VISCERAL SELECTIVE 08/01/2016 [facility]  . IR GENERIC HISTORICAL  08/01/2016   IR EMBO TUMOR ORGAN ISCHEMIA INFARCT INC GUIDE ROADMAPPING 08/01/2016 [facility]  . IR GENERIC HISTORICAL  08/01/2016   IR US GUIDE VASC ACCESS RIGHT 08/01/2016 [facility]  . IR GENERIC  HISTORICAL  08/01/2016   IR ANGIOGRAM SELECTIVE EACH ADDITIONAL VESSEL 08/01/2016 [facility]  . IR GENERIC HISTORICAL  08/01/2016   IR ANGIOGRAM SELECTIVE EACH ADDITIONAL VESSEL 08/01/2016 [facility]  . MASTECTOMY    . mastectomy and biopsy    . sigmoid colectomy 2005     for cancer  . squamous cell CA  [place]  2008       reports that she quit smoking about 41 years ago. Her smoking use included Cigarettes. She has a 40.00 pack-year smoking history. She has never used smokeless tobacco. She reports that she drinks  alcohol. She reports that she uses drugs, including Methylphenidate.  No Known Allergies  Family History  Problem Relation Age of Onset  . Diabetes Mother   . Skin cancer Mother   . Lung cancer Father   . Lymphoma Sister   . Hypertension Sister   . Breast cancer Sister   . Diabetes Sister   . Hypertension Sister   . Breast cancer Sister   . Diabetes Sister   . Hypertension Sister   . Inflammatory bowel disease Daughter   . Esophageal cancer Son   . Stroke Neg Hx   . Colon cancer Neg Hx     Prior to Admission medications   Medication Sig Start Date End Date Taking? Authorizing Provider  aspirin EC 81 MG tablet Take 81 mg by mouth daily.    Yes Historical Provider, MD  Calcium Carbonate-Vitamin D (CALCIUM 600+D) 600-400 MG-UNIT tablet Take 1 tablet by mouth daily.   Yes Historical Provider, MD  diphenoxylate-atropine (LOMOTIL) 2.5-0.025 MG tablet Take 2 tablets by mouth 4 (four) times daily as needed for diarrhea or loose stools. 06/15/16  Yes Jerene Bears, MD  furosemide (LASIX) 40 MG tablet Take 40 mg by mouth daily.   Yes Historical Provider, MD  insulin NPH Human (HUMULIN N,NOVOLIN N) 100 UNIT/ML injection Inject 30-40 Units into the skin 2 (two) times daily. Pt uses 30 units in the morning and 40 units at bedtime.   Yes Historical Provider, MD  meclizine (ANTIVERT) 25 MG tablet Take 1 tablet (25 mg total) by mouth every 4 (four) hours as needed for dizziness. 05/13/14  Yes [name], MD  metFORMIN (GLUCOPHAGE-XR) 500 MG 24 hr tablet Take 1,000 mg by mouth 2 (two) times daily.   Yes Historical Provider, MD  metoCLOPramide (REGLAN) 5 MG tablet Take 5 mg by mouth 2 (two) times daily.  08/18/16  Yes Historical Provider, MD  metoprolol tartrate (LOPRESSOR) 25 MG tablet TAKE 1 TABLET TWICE DAILY 04/24/16  Yes Lorretta Harp, MD  omeprazole (PRILOSEC) 20 MG capsule TAKE 1 CAPSULE TWICE DAILY BEFORE MEALS 06/22/16  Yes Jerene Bears, MD  ondansetron (ZOFRAN-ODT) 4 MG disintegrating  tablet DISSOLVE ONE TABLET IN MOUTH EVERY 8 HOURS AS NEEDED FOR NAUSEA OR VOMITING Patient taking differently: Take 4 mg by mouth every 8 (eight) hours as needed for nausea or vomiting.  06/15/16  Yes Jerene Bears, MD  potassium chloride SA (K-DUR,KLOR-CON) 20 MEQ tablet Take 1 tablet (20 mEq total) by mouth daily. OVERDUE FOR FOLLOW UP. PLEASE CALL AND SCHEDULE [phone] 08/07/16  Yes Lorretta Harp, MD  rosuvastatin (CRESTOR) 10 MG tablet TAKE 1/2 TABLET EVERY DAY 10/15/15  Yes [name], MD  traMADol (ULTRAM) 50 MG tablet Take 1 tablet (50 mg total) by mouth every 6 (six) hours as needed. Patient taking differently: Take 50 mg by mouth every 6 (six) hours as  needed for moderate pain.  03/11/15  Yes [name], MD  memantine (NAMENDA) 10 MG tablet TAKE 1 TABLET TWICE DAILY Patient not taking: Reported on 08/01/2016 07/11/16   [name], MD    Physical Exam:  Constitutional: NAD, calm, comfortable Vitals:   08/19/16 1312 08/19/16 1932 08/19/16 1933  BP: 98/68 109/75   Pulse: 90  93  Resp: 16  15  Temp: 99.9 F (37.7 C)    TempSrc: Oral    SpO2: 96%  95%  Weight: 69.9 kg (154 lb)    Height: 5\' 5"  (1.651 m)     Eyes: PERRL, lids and conjunctivae normal ENMT: Mucous membranes are dry. Posterior pharynx clear of any exudate or lesions. Neck: normal, supple, no masses, no thyromegaly Respiratory: clear to auscultation bilaterally, no wheezing, no crackles. Normal respiratory effort. No accessory muscle use.  Cardiovascular: Regular rate and rhythm, no murmurs / rubs / gallops. No extremity edema. 2+ pedal pulses. No carotid bruits.  Abdomen: Positive mild distention and midline surgical scar, soft, no tenderness, no masses palpated. No hepatosplenomegaly. Bowel sounds positive.  Musculoskeletal: no clubbing / cyanosis. Good ROM, no contractures. Normal muscle tone.  Skin: no significant rashes, lesions, ulcers on limited skin exam Neurologic: CN 2-12 grossly intact. Sensation  intact, DTR normal. Generalized weakness.  Psychiatric: Normal judgment and insight. Alert and oriented x 3. Depressed mood.    Labs on Admission: I have personally reviewed following labs and imaging studies  CBC:  Recent Labs Lab 08/19/16 1349  WBC 8.1  NEUTROABS 5.8  HGB 9.6*  HCT 30.1*  MCV 85.5  PLT 403   Basic Metabolic Panel:  Recent Labs Lab 08/17/16 1606 08/19/16 1349  NA 139 137  K 4.0 3.7  CL 99 100*  CO2 23 23  GLUCOSE 118* 111*  BUN 16 22*  CREATININE 1.34* 1.47*  CALCIUM 9.1 8.5*   GFR: Estimated Creatinine Clearance: 32.5 mL/min (A) (by C-G formula based on SCr of 1.47 mg/dL (H)). Liver Function Tests:  Recent Labs Lab 08/17/16 1606 08/19/16 1349  AST 25 32  ALT 11 14  ALKPHOS 126 108  BILITOT 1.0 1.0  PROT 7.2 7.7  ALBUMIN 3.3* 3.0*   No results for input(s): LIPASE, AMYLASE in the last 168 hours. No results for input(s): AMMONIA in the last 168 hours. Coagulation Profile:  Recent Labs Lab 08/17/16 1606  INR 1.3*   Cardiac Enzymes: No results for input(s): CKTOTAL, CKMB, CKMBINDEX, TROPONINI in the last 168 hours. BNP (last 3 results) No results for input(s): PROBNP in the last 8760 hours. HbA1C: No results for input(s): HGBA1C in the last 72 hours. CBG: No results for input(s): GLUCAP in the last 168 hours. Lipid Profile: No results for input(s): CHOL, HDL, LDLCALC, TRIG, CHOLHDL, LDLDIRECT in the last 72 hours. Thyroid Function Tests: No results for input(s): TSH, T4TOTAL, FREET4, T3FREE, THYROIDAB in the last 72 hours. Anemia Panel: No results for input(s): VITAMINB12, FOLATE, FERRITIN, TIBC, IRON, RETICCTPCT in the last 72 hours. Urine analysis:    Component Value Date/Time   COLORURINE YELLOW 08/19/2016 1643   APPEARANCEUR HAZY (A) 08/19/2016 1643   LABSPEC 1.012 08/19/2016 1643   PHURINE 5.0 08/19/2016 1643   GLUCOSEU NEGATIVE 08/19/2016 1643   HGBUR NEGATIVE 08/19/2016 1643   HGBUR negative 07/17/2008 [place] 08/19/2016 1643   KETONESUR NEGATIVE 08/19/2016 1643   PROTEINUR NEGATIVE 08/19/2016 1643   UROBILINOGEN 1.0 04/27/2009 2108   NITRITE NEGATIVE 08/19/2016 1643  LEUKOCYTESUR NEGATIVE 08/19/2016 1643    Radiological Exams on Admission: Dg Chest 2 View  Result Date: 08/19/2016 CLINICAL DATA:  76 year old female with fever and confusion. Initial encounter. 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 EXAM: CHEST  2 VIEW COMPARISON:  Chest radiographs 07/07/2013 FINDINGS: Semi upright AP and lateral views of the chest. One of the patient arms is not raised on the lateral view. Mildly lower lung volumes. Stable cardiac size and mediastinal contours. Visualized tracheal air column is within normal limits. No pneumothorax. Pulmonary vascularity appears stable without overt edema. No pleural effusion or confluent opacity. Chronic increased pulmonary interstitial markings are stable. Postoperative changes at the right axilla are chronic. No acute osseous abnormality identified. Calcified aortic atherosclerosis. Negative visible bowel gas pattern. IMPRESSION: 1.  No acute cardiopulmonary abnormality. 2.  Calcified aortic atherosclerosis. Electronically Signed   By: Genevie Ann M.D.   On: 08/19/2016 15:30   07/08/2013 echocardiogram  ------------------------------------------------------------ LV EF: 55% -  60%  ------------------------------------------------------------ Indications:   CHF - 428.0. Dyspnea 786.09.  ------------------------------------------------------------ History:  Risk factors: Diabetes mellitus. Dyslipidemia.  ------------------------------------------------------------ Study Conclusions  - Left ventricle: The cavity size was normal. Systolic function was normal. The estimated ejection fraction was in the range of 55% to 60%. Wall motion was normal; there were no regional wall motion  abnormalities. Features are consistent with a pseudonormal left ventricular filling pattern, with concomitant abnormal relaxation and increased filling pressure (grade 2 diastolic dysfunction). - Aortic valve: Mildly calcified annulus. Trileaflet. Moderate diffuse thickening and calcification, consistent with sclerosis. - Mitral valve: Moderate thickening and calcification. Mobility of the posterior leaflet was severely restricted. The findings are consistent with mild stenosis. - Left atrium: The atrium was mildly dilated.  EKG: Independently reviewed. Vent. rate 86 BPM PR interval * ms QRS duration 92 ms QT/QTc 427/511 ms P-R-T axes 65 36 58 Sinus rhythm Prolonged QT interval Baseline wander in lead(s) V2  Assessment/Plan Principal Problem:   Systemic inflammatory response syndrome (SIRS) (HCC)   Fever of undetermined origin. Admit to telemetry/observation. Continue IV fluids. Cefepime and vancomycin per pharmacy. Follow up lactic acid, CBC, CMP. Follow-up blood cultures and sensitivity.  Active Problems:   Diabetes mellitus type 2 in obese (HCC) Carbohydrate modified diet. Continue NPH insulin 30 units in the morning and 40 units at bedtime. Hold metformin due to lactic acidosis. CBG monitoring a regular insulin sliding scale while in the hospital.    HYPERLIPIDEMIA I will hold Crestor for now.    Essential hypertension Hold furosemide for now. Continue metoprolol 25 mg by mouth twice a day. Monitor blood pressure.    GERD Protonix 40 mg by mouth daily.    Anemia, unspecified Monitor hematocrit and hemoglobin.    Hepatocellular carcinoma [facility]) Follow-up with oncology as scheduled.    AKI (acute kidney injury) ([place]) Continue IV fluids. Follow-up renal function and electrolytes in a.m.    DVT prophylaxis: Heparin SQ. Code Status: Full code. Family Communication: Her husband was present in the ED. Disposition Plan: Admit for IV  hydration and close observation. Consults called:  Admission status: Observation/telemetry.   Reubin Milan MD [facility] Pager [phone].  If 7PM-7AM, please contact night-coverage [email or web address] Password [email or web address]  08/19/2016, 8:20 PM

## 2016-08-19 NOTE — Progress Notes (Signed)
LA has increased back up to 3.1.  BP currently 94/41 temp has decreased to 99.5.  Md has noted and added another bolus and antibiotics.

## 2016-08-19 NOTE — ED Triage Notes (Signed)
She states she has felt weak with anorexia and waxing/waning fever x 1 week. She also tells me she has liver cancer. She is in no distress.

## 2016-08-20 DIAGNOSIS — K580 Irritable bowel syndrome with diarrhea: Secondary | ICD-10-CM | POA: Diagnosis present

## 2016-08-20 DIAGNOSIS — I959 Hypotension, unspecified: Secondary | ICD-10-CM | POA: Diagnosis not present

## 2016-08-20 DIAGNOSIS — M1990 Unspecified osteoarthritis, unspecified site: Secondary | ICD-10-CM | POA: Diagnosis present

## 2016-08-20 DIAGNOSIS — E669 Obesity, unspecified: Secondary | ICD-10-CM | POA: Diagnosis not present

## 2016-08-20 DIAGNOSIS — E782 Mixed hyperlipidemia: Secondary | ICD-10-CM | POA: Diagnosis not present

## 2016-08-20 DIAGNOSIS — G4730 Sleep apnea, unspecified: Secondary | ICD-10-CM | POA: Diagnosis present

## 2016-08-20 DIAGNOSIS — E1169 Type 2 diabetes mellitus with other specified complication: Secondary | ICD-10-CM | POA: Diagnosis not present

## 2016-08-20 DIAGNOSIS — D649 Anemia, unspecified: Secondary | ICD-10-CM | POA: Diagnosis not present

## 2016-08-20 DIAGNOSIS — K2270 Barrett's esophagus without dysplasia: Secondary | ICD-10-CM | POA: Diagnosis present

## 2016-08-20 DIAGNOSIS — K7581 Nonalcoholic steatohepatitis (NASH): Secondary | ICD-10-CM | POA: Diagnosis present

## 2016-08-20 DIAGNOSIS — R000 Tachycardia, unspecified: Secondary | ICD-10-CM | POA: Diagnosis not present

## 2016-08-20 DIAGNOSIS — E785 Hyperlipidemia, unspecified: Secondary | ICD-10-CM | POA: Diagnosis present

## 2016-08-20 DIAGNOSIS — D509 Iron deficiency anemia, unspecified: Secondary | ICD-10-CM | POA: Diagnosis present

## 2016-08-20 DIAGNOSIS — J302 Other seasonal allergic rhinitis: Secondary | ICD-10-CM | POA: Diagnosis present

## 2016-08-20 DIAGNOSIS — E876 Hypokalemia: Secondary | ICD-10-CM | POA: Diagnosis not present

## 2016-08-20 DIAGNOSIS — E872 Acidosis: Secondary | ICD-10-CM | POA: Diagnosis present

## 2016-08-20 DIAGNOSIS — I10 Essential (primary) hypertension: Secondary | ICD-10-CM | POA: Diagnosis not present

## 2016-08-20 DIAGNOSIS — K219 Gastro-esophageal reflux disease without esophagitis: Secondary | ICD-10-CM | POA: Diagnosis not present

## 2016-08-20 DIAGNOSIS — K7469 Other cirrhosis of liver: Secondary | ICD-10-CM | POA: Diagnosis present

## 2016-08-20 DIAGNOSIS — R197 Diarrhea, unspecified: Secondary | ICD-10-CM | POA: Diagnosis not present

## 2016-08-20 DIAGNOSIS — Z8551 Personal history of malignant neoplasm of bladder: Secondary | ICD-10-CM | POA: Diagnosis not present

## 2016-08-20 DIAGNOSIS — N179 Acute kidney failure, unspecified: Secondary | ICD-10-CM | POA: Diagnosis not present

## 2016-08-20 DIAGNOSIS — R6510 Systemic inflammatory response syndrome (SIRS) of non-infectious origin without acute organ dysfunction: Secondary | ICD-10-CM | POA: Diagnosis not present

## 2016-08-20 DIAGNOSIS — C228 Malignant neoplasm of liver, primary, unspecified as to type: Secondary | ICD-10-CM | POA: Diagnosis not present

## 2016-08-20 DIAGNOSIS — E860 Dehydration: Secondary | ICD-10-CM | POA: Diagnosis present

## 2016-08-20 DIAGNOSIS — C220 Liver cell carcinoma: Secondary | ICD-10-CM | POA: Diagnosis not present

## 2016-08-20 LAB — CBC WITH DIFFERENTIAL/PLATELET
BASOS ABS: 0 10*3/uL (ref 0.0–0.1)
BASOS PCT: 0 %
EOS ABS: 0 10*3/uL (ref 0.0–0.7)
Eosinophils Relative: 1 %
HCT: 28.3 % — ABNORMAL LOW (ref 36.0–46.0)
Hemoglobin: 8.9 g/dL — ABNORMAL LOW (ref 12.0–15.0)
Lymphocytes Relative: 13 %
Lymphs Abs: 0.8 10*3/uL (ref 0.7–4.0)
MCH: 27.8 pg (ref 26.0–34.0)
MCHC: 31.4 g/dL (ref 30.0–36.0)
MCV: 88.4 fL (ref 78.0–100.0)
MONO ABS: 0.8 10*3/uL (ref 0.1–1.0)
Monocytes Relative: 13 %
NEUTROS PCT: 73 %
Neutro Abs: 4.8 10*3/uL (ref 1.7–7.7)
Platelets: 198 10*3/uL (ref 150–400)
RBC: 3.2 MIL/uL — ABNORMAL LOW (ref 3.87–5.11)
RDW: 17.6 % — AB (ref 11.5–15.5)
WBC: 6.4 10*3/uL (ref 4.0–10.5)

## 2016-08-20 LAB — GLUCOSE, CAPILLARY
GLUCOSE-CAPILLARY: 103 mg/dL — AB (ref 65–99)
GLUCOSE-CAPILLARY: 96 mg/dL (ref 65–99)
Glucose-Capillary: 61 mg/dL — ABNORMAL LOW (ref 65–99)
Glucose-Capillary: 122 mg/dL — ABNORMAL HIGH (ref 65–99)
Glucose-Capillary: 101 mg/dL — ABNORMAL HIGH (ref 65–99)

## 2016-08-20 LAB — LACTIC ACID, PLASMA
LACTIC ACID, VENOUS: 2.1 mmol/L — AB (ref 0.5–1.9)
LACTIC ACID, VENOUS: 2.6 mmol/L — AB (ref 0.5–1.9)
Lactic Acid, Venous: 2.5 mmol/L (ref 0.5–1.9)

## 2016-08-20 LAB — COMPREHENSIVE METABOLIC PANEL
ALBUMIN: 2.7 g/dL — AB (ref 3.5–5.0)
ALT: 13 U/L — ABNORMAL LOW (ref 14–54)
ANION GAP: 10 (ref 5–15)
AST: 30 U/L (ref 15–41)
Alkaline Phosphatase: 91 U/L (ref 38–126)
BUN: 20 mg/dL (ref 6–20)
CALCIUM: 7.8 mg/dL — AB (ref 8.9–10.3)
CHLORIDE: 105 mmol/L (ref 101–111)
CO2: 23 mmol/L (ref 22–32)
CREATININE: 1.23 mg/dL — AB (ref 0.44–1.00)
GFR, EST AFRICAN AMERICAN: 48 mL/min — AB (ref >60)
GFR, EST NON AFRICAN AMERICAN: 42 mL/min — AB (ref >60)
Glucose, Bld: 117 mg/dL — ABNORMAL HIGH (ref 65–99)
Potassium: 3.5 mmol/L (ref 3.5–5.1)
SODIUM: 138 mmol/L (ref 135–145)
Total Bilirubin: 0.9 mg/dL (ref 0.3–1.2)
Total Protein: 6.8 g/dL (ref 6.5–8.1)

## 2016-08-20 MED ORDER — ACETAMINOPHEN 325 MG PO TABS
650.0000 mg | ORAL_TABLET | Freq: Four times a day (QID) | ORAL | Status: DC | PRN
  Administered 2016-08-20: 650 mg via ORAL
  Filled 2016-08-20: qty 2

## 2016-08-20 MED ORDER — DEXTROSE 5 % IV SOLN
2.0000 g | Freq: Every day | INTRAVENOUS | Status: DC
  Administered 2016-08-20 (×2): 2 g via INTRAVENOUS
  Filled 2016-08-20 (×3): qty 2

## 2016-08-20 MED ORDER — VANCOMYCIN HCL IN DEXTROSE 1-5 GM/200ML-% IV SOLN
1000.0000 mg | Freq: Every day | INTRAVENOUS | Status: DC
  Administered 2016-08-20 (×2): 1000 mg via INTRAVENOUS
  Filled 2016-08-20 (×2): qty 200

## 2016-08-20 NOTE — Progress Notes (Signed)
Temp this AM is 101.9, awaiting next Lactic Acid, BP has improved and is 114/50, IVF running at 0100.  Just finished last NS bolus.  Have notified on call and am awaiting response.  No current orders for Tylenol.

## 2016-08-20 NOTE — Progress Notes (Signed)
[facility]                                                                              Patient Demographics  [name] [name], is a 76 y.o. female, DOB - 04-Jun-1940, DJS:[id number]  Admit date - 08/19/2016   Admitting Physician Reubin Milan, MD  Outpatient Primary MD for the patient is Alysia Penna, MD  Outpatient specialists:   LOS - 0  days    Chief Complaint  Patient presents with  . Fever  . Cancer       Brief summary  [name] [name] is a 75 y.o. female with medical history significant for but not limited to history of breast cancer, bladder cancer, colon cancer and, hepatocellular carcinoma, and type 2 diabetes, who presents with progressively worsening generalized weakness, decreased appetite, intermittent nausea with lightheadedness, and low-grade fever without sputum production/cough.  She was noted to be hypotensive and tachycardic with lactic acid level of 4.34. She is admitted for [facility]  Assessment & Plan    Principal Problem:   Systemic inflammatory response syndrome (SIRS) (HCC) Active Problems:   Diabetes mellitus type 2 in obese ([place])   HYPERLIPIDEMIA   Essential hypertension   GERD   Anemia, unspecified   Hepatocellular carcinoma (HCC)   AKI (acute kidney injury) ([place])   #1 Systemic inflammatory response syndrome: UA/ CXR negative Blood culture pending IV antibiotics IV fluids hydration Monitor renal function/electrolytes with hematologic indices Follow lactic acid levels  #2 acute kidney injury: Due to diagnosis #1 Cont IV Hydration with monitoring of renal function and electrolytes  #3 dehydration: IV hydration  #4 type 2 diabetes: Basal insulin with sliding scale Monitor glucose  Hold metformin due to lactic acidosis  #5 hyperlipidemia: Statin held on admission  #6 hypertension: Diuretic at admission Monitor blood pressure   Code Status: Full code DVT Prophylaxis: heparin Family Communication: Discussed in  detail with the patient, all imaging results, lab results explained to the patient    Disposition Plan: Home  Time Spent in minutes  35 minutes  Procedures:   Consultants:    Antimicrobials:  Vancomycin day#2 Cefepime day # 2   Medications  Scheduled Meds: . aspirin EC  81 mg Oral Daily  . calcium-vitamin D  1 tablet Oral Q breakfast  . ceFEPime (MAXIPIME) IV  2 g Intravenous QHS  . heparin  5,000 Units Subcutaneous Q8H  . insulin aspart  0-15 Units Subcutaneous TID WC  . insulin NPH Human  30 Units Subcutaneous QAC breakfast  . insulin NPH Human  40 Units Subcutaneous QHS  . metoCLOPramide  5 mg Oral BID  . metoprolol tartrate  25 mg Oral BID  . pantoprazole  40 mg Oral Daily  . vancomycin  1,000 mg Intravenous QHS   Continuous Infusions: . 0.9 % NaCl with KCl 20 mEq / L 100 mL/hr (08/20/16 0621)   PRN Meds:.acetaminophen, diphenoxylate-atropine, meclizine, ondansetron **OR** ondansetron (ZOFRAN) IV, traMADol   Antibiotics   Anti-infectives    Start     Dose/Rate Route Frequency Ordered Stop   08/20/16 0015  vancomycin (VANCOCIN) IVPB 1000 mg/200 mL premix  1,000 mg 200 mL/hr over 60 Minutes Intravenous Daily at bedtime 08/20/16 0005     08/20/16 0015  ceFEPIme (MAXIPIME) 2 g in dextrose 5 % 50 mL IVPB     2 g 100 mL/hr over 30 Minutes Intravenous Daily at bedtime 08/20/16 0005          Subjective:   [name] [name] was seen and examined today. Had a tempt of 101.9 this am. No vomiting, feels better- her lactic acid level went up a little today     Objective:   Vitals:   08/20/16 0202 08/20/16 0417 08/20/16 0630 08/20/16 1000  BP: 99/86 (!) 114/50  (!) 98/51  Pulse:  95  79  Resp: 18 18    Temp: 98.8 F (37.1 C) (!) 101.9 F (38.8 C) 99.1 F (37.3 C)   TempSrc: Oral Oral Oral   SpO2: 92% 93%    Weight:      Height:        Intake/Output Summary (Last 24 hours) at 08/20/16 1039 Last data filed at 08/20/16 0901  Gross per 24 hour  Intake           5411.67 ml  Output             2701 ml  Net          2710.67 ml     Wt Readings from Last 3 Encounters:  08/19/16 70.7 kg (155 lb 14.4 oz)  08/17/16 69.9 kg (154 lb)  08/01/16 75.3 kg (166 lb)     Exam  General: NAD  HEENT: NCAT,  PERRL,Mildly dry MM  Neck: SUPPLE, (-) JVD  Cardiovascular: RRR, (-) GALLOP, (-) MURMUR  Respiratory: CTA  Gastrointestinal: SOFT, (-) DISTENSION, BS(+), (_) TENDERNESS  Ext: (-) CYANOSIS, (-) EDEMA  Neuro: A, OX 3  Skin:(-) RASH  Psych:NORMAL AFFECT/MOOD   Data Reviewed:  I have personally reviewed following labs and imaging studies  Micro Results No results found for this or any previous visit (from the past 240 hour(s)).  Radiology Reports Dg Chest 2 View  Result Date: 08/19/2016 CLINICAL DATA:  76 year old female with fever and confusion. Initial encounter. 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 EXAM: CHEST  2 VIEW COMPARISON:  Chest radiographs 07/07/2013 FINDINGS: Semi upright AP and lateral views of the chest. One of the patient arms is not raised on the lateral view. Mildly lower lung volumes. Stable cardiac size and mediastinal contours. Visualized tracheal air column is within normal limits. No pneumothorax. Pulmonary vascularity appears stable without overt edema. No pleural effusion or confluent opacity. Chronic increased pulmonary interstitial markings are stable. Postoperative changes at the right axilla are chronic. No acute osseous abnormality identified. Calcified aortic atherosclerosis. Negative visible bowel gas pattern. IMPRESSION: 1.  No acute cardiopulmonary abnormality. 2.  Calcified aortic atherosclerosis. Electronically Signed   By: Genevie Ann M.D.   On: 08/19/2016 15:30   Ct Abdomen Pelvis W Contrast  Result Date: 07/21/2016 CLINICAL DATA:  f/u mesenteric abnormality seen on mri recently, Hx rt breast ca and bladder ca dx 2001 with chemo and xrt completed  2001, hx colon ca dx 2006 with partial colectomy and ? Liver ca dx 2017, Surgery-rt. mastectomy, partial colectomy, umbilical hernia EXAM: CT ABDOMEN AND PELVIS WITH CONTRAST TECHNIQUE: Multidetector CT imaging of the abdomen and pelvis was performed using the standard protocol following bolus administration of intravenous contrast. CONTRAST:  140mL ISOVUE-300 IOPAMIDOL (ISOVUE-300) INJECTION 61%, 183mL ISOVUE-300 IOPAMIDOL (ISOVUE-300) INJECTION 61% COMPARISON:  MRI,  07/19/2016 FINDINGS: Lower chest: Lung base interstitial thickening which appears chronic. Heart is normal in size. No acute findings. Hepatobiliary: Changes of advanced cirrhosis. Lesion in the posterior segment right lobe measures 4 x 3.2 cm transversely on CT. Smaller lesions seen on the recent MRI is not well-defined on CT. No evidence of a new liver lesion. Gallbladder is unremarkable. No bile duct dilation. Pancreas: Hypoattenuating oval circumscribed cystic mass in the pancreatic neck measuring 14 x 12 mm, without change from the MRI, allowing for differences in technique. There is generalized pancreatic atrophy with no other pancreatic masses. No pancreatic inflammation. Spleen: Mildly enlarged measuring 14 cm in greatest dimension. No splenic mass or focal lesion. Adrenals/Urinary Tract: No adrenal masses. No renal masses, stones or hydronephrosis. Ureters normal course and in caliber. Bladder is unremarkable. Stomach/Bowel: There changes from a previous partial hemicolectomy with formation of an ileocolic anastomosis. Multiple diverticula noted mostly along the sigmoid colon. No diverticulitis. No colonic wall thickening or mass. The small bowel and stomach are unremarkable. Vascular/Lymphatic: There is hazy inflammatory type opacity in the retroperitoneal fat adjacent to the inferior mesenteric artery. There is no defined mass or lymph node. There are scattered prominent periceliac and gastrohepatic ligament lymph nodes. There are scattered  subcentimeter retroperitoneal lymph nodes. A 9 mm short axis node lies at the right cardiophrenic angle. Atherosclerotic calcifications are noted throughout the abdominal aorta and its branch vessels. No aneurysm. Reproductive: Uterus and bilateral adnexa are unremarkable. Other: There is hazy opacity in fatty replacement throughout much of the mid to lower rectus abdominus muscles. No hernia. There is no ascites. Musculoskeletal: No defined osteolytic or osteoblastic lesion. IMPRESSION: 1. There is soft tissue attenuation with an infiltrative appearance in the retroperitoneum partly surrounding the inferior mesenteric artery. This is nonspecific. It appears inflammatory, and may be chronic. Consider followup assessment of this abnormality in 4-6 months with CT to assess stability. There is no defined lymph node or mass. 2. Advanced cirrhosis with hepatocellular carcinoma arising from the right lobe, better visualized on the recent liver MRI. 3. Small pancreatic cyst in the neck of the pancreas, likely a benign lesion as detailed on the recent MRI. 4. Status post partial right colectomy. No evidence of bowel obstruction or bowel inflammation. There multiple colonic diverticula mostly along the sigmoid. 5. Aortic atherosclerosis. Electronically Signed   By: Lajean Manes M.D.   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 She presents for drug-eluting bead chemoembolization prior to percutaneous thermal ablation. EXAM: IR EMBO TUMOR ORGAN ISCHEMIA INFARCT INC GUIDE ROADMAPPING; SELECTIVE VISCERAL ARTERIOGRAPHY; IR ULTRASOUND GUIDANCE VASC ACCESS RIGHT; ADDITIONAL ARTERIOGRAPHY 1. Ultrasound-guided right common femoral arterial access 2. Catheterization of the celiac artery with arteriogram 3. Catheterization of the common hepatic artery with arteriogram 4. Catheterization of  the right hepatic artery with arteriogram 5. Catheterization of the segment 6 hepatic artery with arteriogram 6. Drug-eluting bead chemoembolization of the segment 6 hepatic artery MEDICATIONS: 3.375 g Zosyn, 4 mg Zofran and 4 mg Decadron administered. The antibiotic was administered within 1 hour of the procedure ANESTHESIA/SEDATION: Moderate (conscious) sedation was employed during this procedure. A total of Versed 2 mg and Fentanyl 75 mcg was administered intravenously. Moderate Sedation Time: 71 minutes. The patient's level of consciousness and vital signs were monitored continuously by radiology nursing throughout the procedure under my direct supervision. CONTRAST:  91 mL Isovue-300 FLUOROSCOPY TIME:  Fluoroscopy Time:  12 minutes 6 seconds (2,079 mGy). COMPLICATIONS: None immediate. PROCEDURE: Informed consent was obtained from the patient following explanation of the procedure, risks, benefits and alternatives. The patient understands, agrees and consents for the procedure. All questions were addressed. A time out was performed prior to the initiation of the procedure. Maximal barrier sterile technique utilized including caps, mask, sterile gowns, sterile gloves, large sterile drape, hand hygiene, and Betadine prep. The right common femoral artery was interrogated with ultrasound and found to be widely patent. An image was obtained and stored for the medical record. Local anesthesia was attained by infiltration with 1% lidocaine. A small dermatotomy was made. Under real-time sonographic guidance, the vessel was punctured with a 21 gauge micropuncture needle. Using standard technique, the initial micro needle was exchanged over a 0.018 micro wire for a transitional 4 [place] micro sheath. The micro sheath was then exchanged over a 0.035 wire for a 5 French vascular sheath. A C2 cobra catheter was advanced over a Bentson wire into the abdominal aorta. The catheter was used to select the celiac artery. A celiac  arteriogram was performed. Conventional hepatic arterial anatomy. No evidence of replaced hepatic artery. Hypervascular blushes present in hepatic segment 6 consistent with the known hepatocellular carcinoma. There is a tiny hypervascular blush in hepatic segment 2. No definite hypervascularity in hepatic segment 8. The catheter was advanced into the common hepatic artery. Arteriography was performed in multiple obliquities. 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 Small 1 cm hypervascular lesion in the superior aspect of hepatic segment 2 which is indeterminate. No definite hypervascularity in segment 8. A high-flow Renegade microcatheter was advanced into the right hepatic artery. Arteriography was performed with similar findings as above. The segment 6 hepatic artery was successfully identified. This was then selected with the microcatheter in the microcatheter advanced distally in the artery. Arteriography was performed confirming tumor blush. A cone beam CT was also performed further confirming the location of the tumor when compared with the recent prior MRI. Chemoembolization was then performed using a single vial of 70- 150 micron LUMI beads impregnated with 75 mg doxorubicin. Chemoembolization was performed until near stasis. The catheter was then flushed and removed. Post embolization cone beam CT confirms localization of the radiopaque drug-eluting beads within the tumor. A limited right common femoral arteriogram was performed. Successful common femoral arterial access. A Cordis Exoseal extra arterial vascular plug was applied. IMPRESSION: 1. Successful drug-eluting beads chemoembolization of segment 6 hepatocellular carcinoma. 2. Nonspecific hypervascular blush measuring approximately 1 cm in the superior aspect of hepatic segment 2. Attention on follow-up imaging. 3. No definite hypervascular blush in the segment 8 lesion identified on prior MRI.  Attention on follow-up imaging. PLAN: 1. Admitted for observation. Plan for discharge in the morning barring any unexpected abnormalities. 2. Patient to be scheduled for percutaneous microwave ablation in approximately 2-4 weeks. Signed, Criselda Peaches, MD [facility] Radiology Electronically Signed   By: [name] M.D.   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 She presents for drug-eluting bead chemoembolization prior to percutaneous thermal ablation. EXAM: IR EMBO TUMOR ORGAN ISCHEMIA INFARCT INC GUIDE ROADMAPPING; SELECTIVE VISCERAL ARTERIOGRAPHY; IR ULTRASOUND GUIDANCE VASC ACCESS RIGHT; ADDITIONAL ARTERIOGRAPHY 1. Ultrasound-guided right common femoral arterial access 2. Catheterization of the celiac artery with arteriogram 3. Catheterization of the common hepatic artery  with arteriogram 4. Catheterization of the right hepatic artery with arteriogram 5. Catheterization of the segment 6 hepatic artery with arteriogram 6. Drug-eluting bead chemoembolization of the segment 6 hepatic artery MEDICATIONS: 3.375 g Zosyn, 4 mg Zofran and 4 mg Decadron administered. The antibiotic was administered within 1 hour of the procedure ANESTHESIA/SEDATION: Moderate (conscious) sedation was employed during this procedure. A total of Versed 2 mg and Fentanyl 75 mcg was administered intravenously. Moderate Sedation Time: 71 minutes. The patient's level of consciousness and vital signs were monitored continuously by radiology nursing throughout the procedure under my direct supervision. CONTRAST:  91 mL Isovue-300 FLUOROSCOPY TIME:  Fluoroscopy Time: 12 minutes 6 seconds (2,079 mGy). COMPLICATIONS: None immediate. PROCEDURE: Informed consent was obtained from the patient following explanation of the procedure,  risks, benefits and alternatives. The patient understands, agrees and consents for the procedure. All questions were addressed. A time out was performed prior to the initiation of the procedure. Maximal barrier sterile technique utilized including caps, mask, sterile gowns, sterile gloves, large sterile drape, hand hygiene, and Betadine prep. The right common femoral artery was interrogated with ultrasound and found to be widely patent. An image was obtained and stored for the medical record. Local anesthesia was attained by infiltration with 1% lidocaine. A small dermatotomy was made. Under real-time sonographic guidance, the vessel was punctured with a 21 gauge micropuncture needle. Using standard technique, the initial micro needle was exchanged over a 0.018 micro wire for a transitional 4 [place] micro sheath. The micro sheath was then exchanged over a 0.035 wire for a 5 French vascular sheath. A C2 cobra catheter was advanced over a Bentson wire into the abdominal aorta. The catheter was used to select the celiac artery. A celiac arteriogram was performed. Conventional hepatic arterial anatomy. No evidence of replaced hepatic artery. Hypervascular blushes present in hepatic segment 6 consistent with the known hepatocellular carcinoma. There is a tiny hypervascular blush in hepatic segment 2. No definite hypervascularity in hepatic segment 8. The catheter was advanced into the common hepatic artery. Arteriography was performed in multiple obliquities. 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 Small 1 cm hypervascular lesion in the superior aspect of hepatic segment 2 which is indeterminate. No definite hypervascularity in segment 8. A high-flow Renegade microcatheter was advanced into the right hepatic artery. Arteriography was performed with similar findings as above. The segment 6 hepatic artery was successfully identified. This was then selected with the  microcatheter in the microcatheter advanced distally in the artery. Arteriography was performed confirming tumor blush. A cone beam CT was also performed further confirming the location of the tumor when compared with the recent prior MRI. Chemoembolization was then performed using a single vial of 70- 150 micron LUMI beads impregnated with 75 mg doxorubicin. Chemoembolization was performed until near stasis. The catheter was then flushed and removed. Post embolization cone beam CT confirms localization of the radiopaque drug-eluting beads within the tumor. A limited right common femoral arteriogram was performed. Successful common femoral arterial access. A Cordis Exoseal extra arterial vascular plug was applied. IMPRESSION: 1. Successful drug-eluting beads chemoembolization of segment 6 hepatocellular carcinoma. 2. Nonspecific hypervascular blush measuring approximately 1 cm in the superior aspect of hepatic segment 2. Attention on follow-up imaging. 3. No definite hypervascular blush in the segment 8 lesion identified on prior MRI. Attention on follow-up imaging. PLAN: 1. Admitted for observation. Plan for discharge in the morning barring any unexpected abnormalities. 2. Patient to be scheduled for percutaneous  microwave ablation in approximately 2-4 weeks. Signed, Criselda Peaches, MD [facility] Radiology Electronically Signed   By: [name] M.D.   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 She presents for drug-eluting bead chemoembolization prior to percutaneous thermal ablation. EXAM: IR EMBO TUMOR ORGAN ISCHEMIA INFARCT INC GUIDE ROADMAPPING; SELECTIVE VISCERAL ARTERIOGRAPHY; IR ULTRASOUND GUIDANCE VASC ACCESS RIGHT; ADDITIONAL ARTERIOGRAPHY 1. Ultrasound-guided right common femoral  arterial access 2. Catheterization of the celiac artery with arteriogram 3. Catheterization of the common hepatic artery with arteriogram 4. Catheterization of the right hepatic artery with arteriogram 5. Catheterization of the segment 6 hepatic artery with arteriogram 6. Drug-eluting bead chemoembolization of the segment 6 hepatic artery MEDICATIONS: 3.375 g Zosyn, 4 mg Zofran and 4 mg Decadron administered. The antibiotic was administered within 1 hour of the procedure ANESTHESIA/SEDATION: Moderate (conscious) sedation was employed during this procedure. A total of Versed 2 mg and Fentanyl 75 mcg was administered intravenously. Moderate Sedation Time: 71 minutes. The patient's level of consciousness and vital signs were monitored continuously by radiology nursing throughout the procedure under my direct supervision. CONTRAST:  91 mL Isovue-300 FLUOROSCOPY TIME:  Fluoroscopy Time: 12 minutes 6 seconds (2,079 mGy). COMPLICATIONS: None immediate. PROCEDURE: Informed consent was obtained from the patient following explanation of the procedure, risks, benefits and alternatives. The patient understands, agrees and consents for the procedure. All questions were addressed. A time out was performed prior to the initiation of the procedure. Maximal barrier sterile technique utilized including caps, mask, sterile gowns, sterile gloves, large sterile drape, hand hygiene, and Betadine prep. The right common femoral artery was interrogated with ultrasound and found to be widely patent. An image was obtained and stored for the medical record. Local anesthesia was attained by infiltration with 1% lidocaine. A small dermatotomy was made. Under real-time sonographic guidance, the vessel was punctured with a 21 gauge micropuncture needle. Using standard technique, the initial micro needle was exchanged over a 0.018 micro wire for a transitional 4 [place] micro sheath. The micro sheath was then exchanged over a 0.035 wire for a 5  French vascular sheath. A C2 cobra catheter was advanced over a Bentson wire into the abdominal aorta. The catheter was used to select the celiac artery. A celiac arteriogram was performed. Conventional hepatic arterial anatomy. No evidence of replaced hepatic artery. Hypervascular blushes present in hepatic segment 6 consistent with the known hepatocellular carcinoma. There is a tiny hypervascular blush in hepatic segment 2. No definite hypervascularity in hepatic segment 8. The catheter was advanced into the common hepatic artery. Arteriography was performed in multiple obliquities. 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 Small 1 cm hypervascular lesion in the superior aspect of hepatic segment 2 which is indeterminate. No definite hypervascularity in segment 8. A high-flow Renegade microcatheter was advanced into the right hepatic artery. Arteriography was performed with similar findings as above. The segment 6 hepatic artery was successfully identified. This was then selected with the microcatheter in the microcatheter advanced distally in the artery. Arteriography was performed confirming tumor blush. A cone beam CT was also performed further confirming the location of the tumor when compared with the recent prior MRI. Chemoembolization was then performed using a single vial of 70- 150 micron LUMI beads impregnated with 75 mg doxorubicin. Chemoembolization was performed until near stasis. The catheter was then  flushed and removed. Post embolization cone beam CT confirms localization of the radiopaque drug-eluting beads within the tumor. A limited right common femoral arteriogram was performed. Successful common femoral arterial access. A Cordis Exoseal extra arterial vascular plug was applied. IMPRESSION: 1. Successful drug-eluting beads chemoembolization of segment 6 hepatocellular carcinoma. 2. Nonspecific hypervascular blush measuring approximately 1 cm in  the superior aspect of hepatic segment 2. Attention on follow-up imaging. 3. No definite hypervascular blush in the segment 8 lesion identified on prior MRI. Attention on follow-up imaging. PLAN: 1. Admitted for observation. Plan for discharge in the morning barring any unexpected abnormalities. 2. Patient to be scheduled for percutaneous microwave ablation in approximately 2-4 weeks. Signed, Criselda Peaches, MD [facility] Radiology Electronically Signed   By: [name] M.D.   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 She presents for drug-eluting bead chemoembolization prior to percutaneous thermal ablation. EXAM: IR EMBO TUMOR ORGAN ISCHEMIA INFARCT INC GUIDE ROADMAPPING; SELECTIVE VISCERAL ARTERIOGRAPHY; IR ULTRASOUND GUIDANCE VASC ACCESS RIGHT; ADDITIONAL ARTERIOGRAPHY 1. Ultrasound-guided right common femoral arterial access 2. Catheterization of the celiac artery with arteriogram 3. Catheterization of the common hepatic artery with arteriogram 4. Catheterization of the right hepatic artery with arteriogram 5. Catheterization of the segment 6 hepatic artery with arteriogram 6. Drug-eluting bead chemoembolization of the segment 6 hepatic artery MEDICATIONS: 3.375 g Zosyn, 4 mg Zofran and 4 mg Decadron administered. The antibiotic was administered within 1 hour of the procedure ANESTHESIA/SEDATION: Moderate (conscious) sedation was employed during this procedure. A total of Versed 2 mg and Fentanyl 75 mcg was administered intravenously. Moderate Sedation Time: 71 minutes. The patient's level of consciousness and vital signs were monitored continuously by radiology nursing throughout the procedure under my direct supervision. CONTRAST:  91 mL Isovue-300 FLUOROSCOPY TIME:  Fluoroscopy Time: 12 minutes 6 seconds (2,079  mGy). COMPLICATIONS: None immediate. PROCEDURE: Informed consent was obtained from the patient following explanation of the procedure, risks, benefits and alternatives. The patient understands, agrees and consents for the procedure. All questions were addressed. A time out was performed prior to the initiation of the procedure. Maximal barrier sterile technique utilized including caps, mask, sterile gowns, sterile gloves, large sterile drape, hand hygiene, and Betadine prep. The right common femoral artery was interrogated with ultrasound and found to be widely patent. An image was obtained and stored for the medical record. Local anesthesia was attained by infiltration with 1% lidocaine. A small dermatotomy was made. Under real-time sonographic guidance, the vessel was punctured with a 21 gauge micropuncture needle. Using standard technique, the initial micro needle was exchanged over a 0.018 micro wire for a transitional 4 [place] micro sheath. The micro sheath was then exchanged over a 0.035 wire for a 5 French vascular sheath. A C2 cobra catheter was advanced over a Bentson wire into the abdominal aorta. The catheter was used to select the celiac artery. A celiac arteriogram was performed. Conventional hepatic arterial anatomy. No evidence of replaced hepatic artery. Hypervascular blushes present in hepatic segment 6 consistent with the known hepatocellular carcinoma. There is a tiny hypervascular blush in hepatic segment 2. No definite hypervascularity in hepatic segment 8. The catheter was advanced into the common hepatic artery. Arteriography was performed in multiple obliquities. 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 Small 1 cm hypervascular lesion in the superior aspect of hepatic  segment 2 which is indeterminate. No definite hypervascularity in segment 8. A high-flow Renegade microcatheter was advanced into the right hepatic artery. Arteriography was  performed with similar findings as above. The segment 6 hepatic artery was successfully identified. This was then selected with the microcatheter in the microcatheter advanced distally in the artery. Arteriography was performed confirming tumor blush. A cone beam CT was also performed further confirming the location of the tumor when compared with the recent prior MRI. Chemoembolization was then performed using a single vial of 70- 150 micron LUMI beads impregnated with 75 mg doxorubicin. Chemoembolization was performed until near stasis. The catheter was then flushed and removed. Post embolization cone beam CT confirms localization of the radiopaque drug-eluting beads within the tumor. A limited right common femoral arteriogram was performed. Successful common femoral arterial access. A Cordis Exoseal extra arterial vascular plug was applied. IMPRESSION: 1. Successful drug-eluting beads chemoembolization of segment 6 hepatocellular carcinoma. 2. Nonspecific hypervascular blush measuring approximately 1 cm in the superior aspect of hepatic segment 2. Attention on follow-up imaging. 3. No definite hypervascular blush in the segment 8 lesion identified on prior MRI. Attention on follow-up imaging. PLAN: 1. Admitted for observation. Plan for discharge in the morning barring any unexpected abnormalities. 2. Patient to be scheduled for percutaneous microwave ablation in approximately 2-4 weeks. Signed, Criselda Peaches, MD [facility] Radiology Electronically Signed   By: [name] M.D.   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 She presents for drug-eluting bead chemoembolization prior to percutaneous thermal ablation. EXAM: IR EMBO TUMOR ORGAN ISCHEMIA INFARCT INC GUIDE ROADMAPPING; SELECTIVE VISCERAL ARTERIOGRAPHY;  IR ULTRASOUND GUIDANCE VASC ACCESS RIGHT; ADDITIONAL ARTERIOGRAPHY 1. Ultrasound-guided right common femoral arterial access 2. Catheterization of the celiac artery with arteriogram 3. Catheterization of the common hepatic artery with arteriogram 4. Catheterization of the right hepatic artery with arteriogram 5. Catheterization of the segment 6 hepatic artery with arteriogram 6. Drug-eluting bead chemoembolization of the segment 6 hepatic artery MEDICATIONS: 3.375 g Zosyn, 4 mg Zofran and 4 mg Decadron administered. The antibiotic was administered within 1 hour of the procedure ANESTHESIA/SEDATION: Moderate (conscious) sedation was employed during this procedure. A total of Versed 2 mg and Fentanyl 75 mcg was administered intravenously. Moderate Sedation Time: 71 minutes. The patient's level of consciousness and vital signs were monitored continuously by radiology nursing throughout the procedure under my direct supervision. CONTRAST:  91 mL Isovue-300 FLUOROSCOPY TIME:  Fluoroscopy Time: 12 minutes 6 seconds (2,079 mGy). COMPLICATIONS: None immediate. PROCEDURE: Informed consent was obtained from the patient following explanation of the procedure, risks, benefits and alternatives. The patient understands, agrees and consents for the procedure. All questions were addressed. A time out was performed prior to the initiation of the procedure. Maximal barrier sterile technique utilized including caps, mask, sterile gowns, sterile gloves, large sterile drape, hand hygiene, and Betadine prep. The right common femoral artery was interrogated with ultrasound and found to be widely patent. An image was obtained and stored for the medical record. Local anesthesia was attained by infiltration with 1% lidocaine. A small dermatotomy was made. Under real-time sonographic guidance, the vessel was punctured with a 21 gauge micropuncture needle. Using standard technique, the initial micro needle was exchanged over a 0.018 micro wire  for a transitional 4 [place] micro sheath. The micro sheath was then exchanged over a 0.035 wire  for a 5 [place] vascular sheath. A C2 cobra catheter was advanced over a Bentson wire into the abdominal aorta. The catheter was used to select the celiac artery. A celiac arteriogram was performed. Conventional hepatic arterial anatomy. No evidence of replaced hepatic artery. Hypervascular blushes present in hepatic segment 6 consistent with the known hepatocellular carcinoma. There is a tiny hypervascular blush in hepatic segment 2. No definite hypervascularity in hepatic segment 8. The catheter was advanced into the common hepatic artery. Arteriography was performed in multiple obliquities. 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 Small 1 cm hypervascular lesion in the superior aspect of hepatic segment 2 which is indeterminate. No definite hypervascularity in segment 8. A high-flow Renegade microcatheter was advanced into the right hepatic artery. Arteriography was performed with similar findings as above. The segment 6 hepatic artery was successfully identified. This was then selected with the microcatheter in the microcatheter advanced distally in the artery. Arteriography was performed confirming tumor blush. A cone beam CT was also performed further confirming the location of the tumor when compared with the recent prior MRI. Chemoembolization was then performed using a single vial of 70- 150 micron LUMI beads impregnated with 75 mg doxorubicin. Chemoembolization was performed until near stasis. The catheter was then flushed and removed. Post embolization cone beam CT confirms localization of the radiopaque drug-eluting beads within the tumor. A limited right common femoral arteriogram was performed. Successful common femoral arterial access. A Cordis Exoseal extra arterial vascular plug was applied. IMPRESSION: 1. Successful drug-eluting beads chemoembolization  of segment 6 hepatocellular carcinoma. 2. Nonspecific hypervascular blush measuring approximately 1 cm in the superior aspect of hepatic segment 2. Attention on follow-up imaging. 3. No definite hypervascular blush in the segment 8 lesion identified on prior MRI. Attention on follow-up imaging. PLAN: 1. Admitted for observation. Plan for discharge in the morning barring any unexpected abnormalities. 2. Patient to be scheduled for percutaneous microwave ablation in approximately 2-4 weeks. Signed, Criselda Peaches, MD [facility] Radiology Electronically Signed   By: [name] M.D.   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 She presents for drug-eluting bead chemoembolization prior to percutaneous thermal ablation. EXAM: IR EMBO TUMOR ORGAN ISCHEMIA INFARCT INC GUIDE ROADMAPPING; SELECTIVE VISCERAL ARTERIOGRAPHY; IR ULTRASOUND GUIDANCE VASC ACCESS RIGHT; ADDITIONAL ARTERIOGRAPHY 1. Ultrasound-guided right common femoral arterial access 2. Catheterization of the celiac artery with arteriogram 3. Catheterization of the common hepatic artery with arteriogram 4. Catheterization of the right hepatic artery with arteriogram 5. Catheterization of the segment 6 hepatic artery with arteriogram 6. Drug-eluting bead chemoembolization of the segment 6 hepatic artery MEDICATIONS: 3.375 g Zosyn, 4 mg Zofran and 4 mg Decadron administered. The antibiotic was administered within 1 hour of the procedure ANESTHESIA/SEDATION: Moderate (conscious) sedation was employed during this procedure. A total of Versed 2 mg and Fentanyl 75 mcg was administered intravenously. Moderate Sedation Time: 71 minutes. The patient's level of consciousness and vital signs were monitored continuously by radiology nursing  throughout the procedure under my direct supervision. CONTRAST:  91 mL Isovue-300 FLUOROSCOPY TIME:  Fluoroscopy Time: 12 minutes 6 seconds (2,079 mGy). COMPLICATIONS: None immediate. PROCEDURE: Informed consent was obtained from the patient following explanation of the procedure, risks, benefits and alternatives. The patient understands, agrees and consents for the procedure. All questions were addressed. A time  out was performed prior to the initiation of the procedure. Maximal barrier sterile technique utilized including caps, mask, sterile gowns, sterile gloves, large sterile drape, hand hygiene, and Betadine prep. The right common femoral artery was interrogated with ultrasound and found to be widely patent. An image was obtained and stored for the medical record. Local anesthesia was attained by infiltration with 1% lidocaine. A small dermatotomy was made. Under real-time sonographic guidance, the vessel was punctured with a 21 gauge micropuncture needle. Using standard technique, the initial micro needle was exchanged over a 0.018 micro wire for a transitional 4 [place] micro sheath. The micro sheath was then exchanged over a 0.035 wire for a 5 French vascular sheath. A C2 cobra catheter was advanced over a Bentson wire into the abdominal aorta. The catheter was used to select the celiac artery. A celiac arteriogram was performed. Conventional hepatic arterial anatomy. No evidence of replaced hepatic artery. Hypervascular blushes present in hepatic segment 6 consistent with the known hepatocellular carcinoma. There is a tiny hypervascular blush in hepatic segment 2. No definite hypervascularity in hepatic segment 8. The catheter was advanced into the common hepatic artery. Arteriography was performed in multiple obliquities. 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 Small 1 cm hypervascular lesion in the superior aspect of hepatic segment 2 which is  indeterminate. No definite hypervascularity in segment 8. A high-flow Renegade microcatheter was advanced into the right hepatic artery. Arteriography was performed with similar findings as above. The segment 6 hepatic artery was successfully identified. This was then selected with the microcatheter in the microcatheter advanced distally in the artery. Arteriography was performed confirming tumor blush. A cone beam CT was also performed further confirming the location of the tumor when compared with the recent prior MRI. Chemoembolization was then performed using a single vial of 70- 150 micron LUMI beads impregnated with 75 mg doxorubicin. Chemoembolization was performed until near stasis. The catheter was then flushed and removed. Post embolization cone beam CT confirms localization of the radiopaque drug-eluting beads within the tumor. A limited right common femoral arteriogram was performed. Successful common femoral arterial access. A Cordis Exoseal extra arterial vascular plug was applied. IMPRESSION: 1. Successful drug-eluting beads chemoembolization of segment 6 hepatocellular carcinoma. 2. Nonspecific hypervascular blush measuring approximately 1 cm in the superior aspect of hepatic segment 2. Attention on follow-up imaging. 3. No definite hypervascular blush in the segment 8 lesion identified on prior MRI. Attention on follow-up imaging. PLAN: 1. Admitted for observation. Plan for discharge in the morning barring any unexpected abnormalities. 2. Patient to be scheduled for percutaneous microwave ablation in approximately 2-4 weeks. Signed, Criselda Peaches, MD [facility] Radiology Electronically Signed   By: [name] M.D.   On: 08/01/2016 17:26    Lab Data:  CBC:  Recent Labs Lab 08/19/16 1349 08/20/16 0312  WBC 8.1 6.4  NEUTROABS 5.8 4.8  HGB 9.6* 8.9*  HCT 30.1* 28.3*  MCV 85.5 88.4  PLT 238 884   Basic Metabolic  Panel:  Recent Labs Lab 08/17/16 1606 08/19/16 1349 08/19/16 2039 08/20/16 0312  NA 139 137  --  138  K 4.0 3.7  --  3.5  CL 99 100*  --  105  CO2 23 23  --  23  GLUCOSE 118* 111*  --  117*  BUN 16 22*  --  20  CREATININE 1.34* 1.47*  --  1.23*  CALCIUM 9.1 8.5*  --  7.8*  MG  --   --  1.0*  --   PHOS  --   --  2.6  --    GFR: Estimated Creatinine Clearance: 39 mL/min (A) (by C-G formula based on SCr of 1.23 mg/dL (H)). Liver Function Tests:  Recent Labs Lab 08/17/16 1606 08/19/16 1349 08/20/16 0312  AST 25 32 30  ALT 11 14 13*  ALKPHOS 126 108 91  BILITOT 1.0 1.0 0.9  PROT 7.2 7.7 6.8  ALBUMIN 3.3* 3.0* 2.7*   No results for input(s): LIPASE, AMYLASE in the last 168 hours. No results for input(s): AMMONIA in the last 168 hours. Coagulation Profile:  Recent Labs Lab 08/17/16 1606 08/19/16 2039  INR 1.3* 1.42   Cardiac Enzymes: No results for input(s): CKTOTAL, CKMB, CKMBINDEX, TROPONINI in the last 168 hours. BNP (last 3 results) No results for input(s): PROBNP in the last 8760 hours. HbA1C: No results for input(s): HGBA1C in the last 72 hours. CBG:  Recent Labs Lab 08/19/16 2107 08/19/16 2207 08/20/16 0812 08/20/16 0928  GLUCAP 72 145* 61* 103*   Lipid Profile: No results for input(s): CHOL, HDL, LDLCALC, TRIG, CHOLHDL, LDLDIRECT in the last 72 hours. Thyroid Function Tests: No results for input(s): TSH, T4TOTAL, FREET4, T3FREE, THYROIDAB in the last 72 hours. Anemia Panel: No results for input(s): VITAMINB12, FOLATE, FERRITIN, TIBC, IRON, RETICCTPCT in the last 72 hours. Urine analysis:    Component Value Date/Time   COLORURINE YELLOW 08/19/2016 1643   APPEARANCEUR HAZY (A) 08/19/2016 1643   LABSPEC 1.012 08/19/2016 1643   PHURINE 5.0 08/19/2016 1643   GLUCOSEU NEGATIVE 08/19/2016 1643   HGBUR NEGATIVE 08/19/2016 1643   HGBUR negative 07/17/2008 [place] 08/19/2016 1643   KETONESUR NEGATIVE 08/19/2016 1643   PROTEINUR  NEGATIVE 08/19/2016 1643   UROBILINOGEN 1.0 04/27/2009 2108   NITRITE NEGATIVE 08/19/2016 1643   LEUKOCYTESUR NEGATIVE 08/19/2016 1643     OSEI-BONSU,Henya Aguallo M.D. [facility] 08/20/2016, 10:39 AM  Pager: [phone] Between 7am to 7pm - call Pager - [phone]  After 7pm go to [email or web address] - password [email or web address]  Call night coverage person covering after 7pm

## 2016-08-20 NOTE — Progress Notes (Signed)
Pharmacy Antibiotic Note  [name] [name] is a 76 y.o. female admitted on 08/19/2016 with Fever of undetermined origin.  Pharmacy has been consulted for Vancomycin, cefepime  dosing.  Plan: Vancomycin 1gm IV every 24 hours.  Goal trough 15-20 mcg/mL. Cefepime 2gm iv q24hr  Height: 5\' 5"  (165.1 cm) Weight: 155 lb 14.4 oz (70.7 kg) IBW/kg (Calculated) : 57  Temp (24hrs), Avg:99.7 F (37.6 C), Min:99.5 F (37.5 C), Max:99.9 F (37.7 C)   Recent Labs Lab 08/17/16 1606 08/19/16 1349 08/19/16 1402 08/19/16 1706 08/19/16 2039 08/19/16 2257  WBC  --  8.1  --   --   --   --   CREATININE 1.34* 1.47*  --   --   --   --   LATICACIDVEN  --   --  4.34* 3.51* 2.5* 3.1*    Estimated Creatinine Clearance: 32.6 mL/min (A) (by C-G formula based on SCr of 1.47 mg/dL (H)).    No Known Allergies  Antimicrobials this admission: Vancomycin 08/20/2016 >> Cefepime 08/20/2016 >>   Dose adjustments this admission: -  Microbiology results: pending  Thank you for allowing pharmacy to be a part of this patient's care.  Nani Skillern Crowford 08/20/2016 12:07 AM

## 2016-08-20 NOTE — Progress Notes (Signed)
CRITICAL VALUE ALERT  Critical value received: Lactic Acid  Date of notification:  08/20/16  Time of notification:  6825  Critical value read back: yes  Nurse who received alert:  Jodell Cipro  MD notified (1st page): 1040  Time of first page:  1040  MD notified (2nd page):  Time of second page:  Responding MD: Beverely Risen  Time MD responded: 1040

## 2016-08-21 DIAGNOSIS — E1169 Type 2 diabetes mellitus with other specified complication: Secondary | ICD-10-CM

## 2016-08-21 DIAGNOSIS — I10 Essential (primary) hypertension: Secondary | ICD-10-CM

## 2016-08-21 DIAGNOSIS — D649 Anemia, unspecified: Secondary | ICD-10-CM

## 2016-08-21 DIAGNOSIS — K219 Gastro-esophageal reflux disease without esophagitis: Secondary | ICD-10-CM

## 2016-08-21 DIAGNOSIS — E876 Hypokalemia: Secondary | ICD-10-CM

## 2016-08-21 DIAGNOSIS — R197 Diarrhea, unspecified: Secondary | ICD-10-CM

## 2016-08-21 DIAGNOSIS — E782 Mixed hyperlipidemia: Secondary | ICD-10-CM

## 2016-08-21 DIAGNOSIS — E669 Obesity, unspecified: Secondary | ICD-10-CM

## 2016-08-21 DIAGNOSIS — C220 Liver cell carcinoma: Secondary | ICD-10-CM

## 2016-08-21 DIAGNOSIS — R6510 Systemic inflammatory response syndrome (SIRS) of non-infectious origin without acute organ dysfunction: Secondary | ICD-10-CM

## 2016-08-21 DIAGNOSIS — N179 Acute kidney failure, unspecified: Principal | ICD-10-CM

## 2016-08-21 LAB — CBC
HCT: 25.1 % — ABNORMAL LOW (ref 36.0–46.0)
HEMOGLOBIN: 8 g/dL — AB (ref 12.0–15.0)
MCH: 27.5 pg (ref 26.0–34.0)
MCHC: 31.9 g/dL (ref 30.0–36.0)
MCV: 86.3 fL (ref 78.0–100.0)
Platelets: 148 10*3/uL — ABNORMAL LOW (ref 150–400)
RBC: 2.91 MIL/uL — AB (ref 3.87–5.11)
RDW: 17.4 % — ABNORMAL HIGH (ref 11.5–15.5)
WBC: 5 10*3/uL (ref 4.0–10.5)

## 2016-08-21 LAB — MAGNESIUM: Magnesium: 1.8 mg/dL (ref 1.7–2.4)

## 2016-08-21 LAB — BASIC METABOLIC PANEL
ANION GAP: 7 (ref 5–15)
BUN: 12 mg/dL (ref 6–20)
CALCIUM: 7.6 mg/dL — AB (ref 8.9–10.3)
CHLORIDE: 109 mmol/L (ref 101–111)
CO2: 21 mmol/L — AB (ref 22–32)
Creatinine, Ser: 1.04 mg/dL — ABNORMAL HIGH (ref 0.44–1.00)
GFR calc non Af Amer: 51 mL/min — ABNORMAL LOW (ref >60)
GFR, EST AFRICAN AMERICAN: 59 mL/min — AB (ref >60)
Glucose, Bld: 101 mg/dL — ABNORMAL HIGH (ref 65–99)
Potassium: 3.4 mmol/L — ABNORMAL LOW (ref 3.5–5.1)
Sodium: 137 mmol/L (ref 135–145)

## 2016-08-21 LAB — GLUCOSE, CAPILLARY
GLUCOSE-CAPILLARY: 83 mg/dL (ref 65–99)
GLUCOSE-CAPILLARY: 106 mg/dL — AB (ref 65–99)
Glucose-Capillary: 167 mg/dL — ABNORMAL HIGH (ref 65–99)

## 2016-08-21 LAB — PHOSPHORUS: PHOSPHORUS: 2 mg/dL — AB (ref 2.5–4.6)

## 2016-08-21 LAB — LACTIC ACID, PLASMA: Lactic Acid, Venous: 1.3 mmol/L (ref 0.5–1.9)

## 2016-08-21 MED ORDER — POTASSIUM CHLORIDE CRYS ER 20 MEQ PO TBCR
40.0000 meq | EXTENDED_RELEASE_TABLET | Freq: Two times a day (BID) | ORAL | Status: AC
  Administered 2016-08-21 (×2): 40 meq via ORAL
  Filled 2016-08-21 (×2): qty 2

## 2016-08-21 MED ORDER — FAMOTIDINE 20 MG PO TABS
20.0000 mg | ORAL_TABLET | Freq: Two times a day (BID) | ORAL | Status: DC
  Administered 2016-08-21 – 2016-08-22 (×3): 20 mg via ORAL
  Filled 2016-08-21 (×2): qty 1

## 2016-08-21 MED ORDER — MAGNESIUM SULFATE 4 GM/100ML IV SOLN
4.0000 g | Freq: Once | INTRAVENOUS | Status: AC
  Administered 2016-08-21: 4 g via INTRAVENOUS
  Filled 2016-08-21: qty 100

## 2016-08-21 MED ORDER — VITAMINS A & D EX OINT
TOPICAL_OINTMENT | CUTANEOUS | Status: AC
  Administered 2016-08-21: 5
  Filled 2016-08-21: qty 5

## 2016-08-21 NOTE — Progress Notes (Signed)
Referring Physician(s): Sherrill,B  Supervising Physician: [name]  Patient Status:  [facility] - In-pt  Chief Complaint: Fever, weakness, HCC   Subjective: Pt states she feels better this am; has eaten; denies pain,N/V or resp difficulties; sitting up in chair   Allergies: Patient has no known allergies.  Medications: Prior to Admission medications   Medication Sig Start Date End Date Taking? Authorizing Provider  aspirin EC 81 MG tablet Take 81 mg by mouth daily.    Yes Historical Provider, MD  Calcium Carbonate-Vitamin D (CALCIUM 600+D) 600-400 MG-UNIT tablet Take 1 tablet by mouth daily.   Yes Historical Provider, MD  diphenoxylate-atropine (LOMOTIL) 2.5-0.025 MG tablet Take 2 tablets by mouth 4 (four) times daily as needed for diarrhea or loose stools. 06/15/16  Yes Jerene Bears, MD  furosemide (LASIX) 40 MG tablet Take 40 mg by mouth daily.   Yes Historical Provider, MD  insulin NPH Human (HUMULIN N,NOVOLIN N) 100 UNIT/ML injection Inject 30-40 Units into the skin 2 (two) times daily. Pt uses 30 units in the morning and 40 units at bedtime.   Yes Historical Provider, MD  meclizine (ANTIVERT) 25 MG tablet Take 1 tablet (25 mg total) by mouth every 4 (four) hours as needed for dizziness. 05/13/14  Yes [name], MD  metFORMIN (GLUCOPHAGE-XR) 500 MG 24 hr tablet Take 1,000 mg by mouth 2 (two) times daily.   Yes Historical Provider, MD  metoCLOPramide (REGLAN) 5 MG tablet Take 5 mg by mouth 2 (two) times daily.  08/18/16  Yes Historical Provider, MD  metoprolol tartrate (LOPRESSOR) 25 MG tablet TAKE 1 TABLET TWICE DAILY 04/24/16  Yes Lorretta Harp, MD  omeprazole (PRILOSEC) 20 MG capsule TAKE 1 CAPSULE TWICE DAILY BEFORE MEALS 06/22/16  Yes Jerene Bears, MD  ondansetron (ZOFRAN-ODT) 4 MG disintegrating tablet DISSOLVE ONE TABLET IN MOUTH EVERY 8 HOURS AS NEEDED FOR NAUSEA OR VOMITING Patient taking differently: Take 4 mg by mouth every 8 (eight) hours as needed for nausea  or vomiting.  06/15/16  Yes Jerene Bears, MD  potassium chloride SA (K-DUR,KLOR-CON) 20 MEQ tablet Take 1 tablet (20 mEq total) by mouth daily. OVERDUE FOR FOLLOW UP. PLEASE CALL AND SCHEDULE [phone] 08/07/16  Yes Lorretta Harp, MD  rosuvastatin (CRESTOR) 10 MG tablet TAKE 1/2 TABLET EVERY DAY 10/15/15  Yes [name], MD  traMADol (ULTRAM) 50 MG tablet Take 1 tablet (50 mg total) by mouth every 6 (six) hours as needed. Patient taking differently: Take 50 mg by mouth every 6 (six) hours as needed for moderate pain.  03/11/15  Yes [name], MD  memantine (NAMENDA) 10 MG tablet TAKE 1 TABLET TWICE DAILY Patient not taking: Reported on 08/01/2016 07/11/16   [name], MD     Vital Signs: BP 118/77 (BP Location: Left Arm)   Pulse 88   Temp 98.8 F (37.1 C) (Oral)   Resp 19   Ht 5\' 5"  (1.651 m)   Wt 155 lb 14.4 oz (70.7 kg)   SpO2 96%   BMI 25.94 kg/m   Physical Exam awake/alert; chest- CTA bilat; heart- RRR; abd- soft, mild dist,+BS,NT; rt CFA site soft,NT, no hematoma; no LE edema  Imaging: Dg Chest 2 View  Result Date: 08/19/2016 CLINICAL DATA:  76 year old female with fever and confusion. Initial encounter. 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 EXAM: CHEST  2 VIEW COMPARISON:  Chest radiographs 07/07/2013 FINDINGS: Semi upright AP and lateral views  of the chest. One of the patient arms is not raised on the lateral view. Mildly lower lung volumes. Stable cardiac size and mediastinal contours. Visualized tracheal air column is within normal limits. No pneumothorax. Pulmonary vascularity appears stable without overt edema. No pleural effusion or confluent opacity. Chronic increased pulmonary interstitial markings are stable. Postoperative changes at the right axilla are chronic. No acute osseous abnormality identified. Calcified aortic atherosclerosis. Negative visible bowel gas pattern. IMPRESSION: 1.  No acute cardiopulmonary  abnormality. 2.  Calcified aortic atherosclerosis. Electronically Signed   By: Genevie Ann M.D.   On: 08/19/2016 15:30    Labs:  CBC:  Recent Labs  08/02/16 0506 08/19/16 1349 08/20/16 0312 08/21/16 0552  WBC 4.5 8.1 6.4 5.0  HGB 9.0* 9.6* 8.9* 8.0*  HCT 27.7* 30.1* 28.3* 25.1*  PLT 130* 238 198 148*    COAGS:  Recent Labs  04/11/16 1457 05/04/16 1129  07/04/16 1830 07/11/16 0638 08/01/16 1154 08/17/16 1606 08/19/16 2039  INR 1.29 1.23  < > 1.22 1.2* 1.18 1.3* 1.42  APTT 39* 43*  --  36  --   --   --  36  < > = values in this interval not displayed.  BMP:  Recent Labs  08/17/16 1606 08/19/16 1349 08/20/16 0312 08/21/16 0552  NA 139 137 138 137  K 4.0 3.7 3.5 3.4*  CL 99 100* 105 109  CO2 23 23 23  21*  GLUCOSE 118* 111* 117* 101*  BUN 16 22* 20 12  CALCIUM 9.1 8.5* 7.8* 7.6*  CREATININE 1.34* 1.47* 1.23* 1.04*  GFRNONAA 39* 34* 42* 51*  GFRAA 45* 39* 48* 59*    LIVER FUNCTION TESTS:  Recent Labs  08/02/16 0506 08/17/16 1606 08/19/16 1349 08/20/16 0312  BILITOT 1.1 1.0 1.0 0.9  AST 61* 25 32 30  ALT 25 11 14  13*  ALKPHOS 96 126 108 91  PROT 7.0 7.2 7.7 6.8  ALBUMIN 2.9* 3.3* 3.0* 2.7*    Assessment and Plan: Pt with hx segment 6 HCC DEB- TACE 08/01/16; recently admitted with fatigue, fever, weakness, elevated creat/lactic acidosis; currently AF, Tmax 100.7; CXR neg; lactic acid today nl; WBC nl; K 3.4, creat 1.04(1.23), hgb 8.0(8.9), plts 148k, cx's neg to date; cont current tx/monitor labs, incl LFT's ; pt tent scheduled for thermal ablation seg 6 HCC on 09/01/16   Electronically Signed: D. Rowe Robert 08/21/2016, 10:24 AM   I spent a total of 20 minutes at the the patient's bedside AND on the patient's hospital floor or unit, greater than 50% of which was counseling/coordinating care for DEB- TACE  hepatocellular carcinoma    Patient ID: Martie Round, female   DOB: 01/27/1941, 76 y.o.   MRN: [id number]

## 2016-08-21 NOTE — Progress Notes (Signed)
PROGRESS NOTE    [name] [name]  AJO:[id number] DOB: 12-Oct-1940 DOA: 08/19/2016 PCP: [name] Penna, MD   Brief Narrative:  [name] [name] is a 76 y.o. female with medical history significant of breast cancer, seasonal allergies, osteoarthritis, (history of breast cancer, bladder cancer, colon cancer), hepatocellular carcinoma, Barrett's esophagus, GERD, gastritis, hemorrhoids, hepatic cirrhosis, IBS, type 2 diabetes, hyperlipidemia, hypertension, iron deficiency anemia, sleep apnea who is coming to the emergency department with a week history of progressively worse weakness, decreased appetite, low-grade fever fever and malaise. She denies headache, sore throat, productive cough, chest pain, palpitations, PND, orthopnea or lower extremities edema. Patient states that she has feels lightheaded and nauseous at times. Admitted for SIRS. Admitted to having some diarrhea after her chemo-embolization. Empiric Abx Stopped and will monitor for fevers off of Abx.   Assessment & Plan:   Principal Problem:   Systemic inflammatory response syndrome (SIRS) (HCC) Active Problems:   Diabetes mellitus type 2 in obese ([place])   HYPERLIPIDEMIA   Essential hypertension   GERD   Anemia, unspecified   Hepatocellular carcinoma (HCC)   AKI (acute kidney injury) ([place])   SIRS (systemic inflammatory response syndrome) (HCC)   Hypokalemia   Hypomagnesemia   Diarrhea  SIRS likely 2/2 to Post-Chemoemoblization with no Identifiable Source of Infection noted  -PER IR PA Note Recent Segment 6 Hepatocellular Carcinonma Drug Eluting Beads- TACE 08/01/16 -UA/ CXR Negative -Influenza A/B PCR Negative -Blood culture Show NGTD -Will D/C Empiric IV Antibiotics and monitor off Abx IV fluids hydration -Had Temperature of 100.7 Last Night -Lactic acid levels Decreased from 3.1 -> 2.6 -> 1.3 -C/w IVF Rehydration  -IR Saw patient today and she is scheduled for tentative Thermal Ablation Seg HCC on 09/01/16 -Obtain PT  Evaluation  AKI  -Improved BUN/Cr went from 22/1.47 -> 12/1.04 -Likely from Dehydration and Diarrhea, Nausea and poor po Intake -C/w IVF Rehydration -C/w IV Hydration with NS + KCl 20 mEQ at 100 mL/hr -Repeat CMP in AM  Dehydration likely from poor po Intake after DEB- TACE -Improving; -C/w Reglan 5 mg po BID -C/w IV Hydration with NS + KCl 20 mEQ at 100 mL/hr -Repeat CMP in AM  Insulin Dependent Diabetes Mellitus Type 2 -C/w Insulin NPH 30 units sq daily before breakfast and 40 unit sq daily qHS  -C/w Moderate Novolog SSI AC -Metformin Held due to Lactic Acidosis -CBG's ranging from 96-167 -Hypoglycemia Protocol in Place  Hypokalemia -Patient's K+ was 3.4 -On IVF NS + 20 mEQ of KCl -Replete with KCl 40 mEQ po BID -Repeat CMP in AM  Hypomagnesemia -Mag Level was 1.0 on 08/19/16 -Replete with IV Mag Sulfate 4 grams -Repeat Mag Level in AM  Hyperlipidemia -Rosuvastatin 10 mg po held on admission  Hypertension -Diuretic with Furosemide 40 mg po Daily held on admission -C/w Metoprolol 25 mg po BID  Hepatocellular Carcinoma -Follow up with Oncology as scheduled -Interventional Radiology Following  GERD -Pharmacy Substitution of Pantoprazole 40 mg po Daily stopped given Diarrhea -Will place on Famotidine BID  Diarrhea  -C/w IVF; Patient states she has not been on Abx recently but per IR report she was given a prescription for Ciprofloxacin 500 mg po BID x 1 week -Currently being treated with Lomotil 2.5-0.025 2 tab po 4 Times daily prn Diarrhea or Loose stools with Max dose of 8 tablets  -Replete Lytes as needed  -Change Protonix to Famotidine -If Continues will likely need to check for C Difficile but clinically currently do not suspect C  Difficile at this time given no Leukocytosis and intermittently febrile   Anemia unspecified -Hb/Hct went from 9.6/30.1 -> 8.0/25.1 -Suspect Dilutional Component as patient was bolused 2 liters and now on Maintenance Fluid  with NS at 100 mL/hr -Repeat CBC in AM   DVT prophylaxis: Heparin 5,000 units sq q8h Code Status: FULL CODE Family Communication: Discussed with Daughter and Husband at bedside Disposition Plan: Pending PT Evaluation  Consultants:   Interventional Radiology   Procedures: None   Antimicrobials: Anti-infectives    Start     Dose/Rate Route Frequency Ordered Stop   08/20/16 0015  vancomycin (VANCOCIN) IVPB 1000 mg/200 mL premix  Status:  Discontinued     1,000 mg 200 mL/hr over 60 Minutes Intravenous Daily at bedtime 08/20/16 0005 08/21/16 1304   08/20/16 0015  ceFEPIme (MAXIPIME) 2 g in dextrose 5 % 50 mL IVPB  Status:  Discontinued     2 g 100 mL/hr over 30 Minutes Intravenous Daily at bedtime 08/20/16 0005 08/21/16 1304     Subjective: Seen and examined and stated she was feeling better. Had some nausea and complaining of diarrhea but that is nothing new. No CP or SOB. No other concerns or complaints at this time.   Objective: Vitals:   08/20/16 1230 08/20/16 2108 08/21/16 0459 08/21/16 1341  BP:  (!) 113/49 118/77 (!) 131/59  Pulse:  (!) 102 88 92  Resp:  18 19 20   Temp: 100.1 F (37.8 C) (!) 100.7 F (38.2 C) 98.8 F (37.1 C) 98.9 F (37.2 C)  TempSrc:  Oral Oral Oral  SpO2:  96% 96% 99%  Weight:      Height:        Intake/Output Summary (Last 24 hours) at 08/21/16 1419 Last data filed at 08/21/16 1300  Gross per 24 hour  Intake          2816.67 ml  Output             1250 ml  Net          1566.67 ml   Filed Weights   08/19/16 1312 08/19/16 2007  Weight: 69.9 kg (154 lb) 70.7 kg (155 lb 14.4 oz)   Examination: Physical Exam:  Constitutional:  NAD and appears calm and comfortable Eyes: Lids and conjunctivae normal, sclerae anicteric  ENMT: External Ears, Nose appear normal. Grossly normal hearing. Neck: Appears normal, supple, no cervical masses, normal ROM, no appreciable thyromegaly Respiratory: Clear to auscultation bilaterally, no wheezing, rales,  rhonchi or crackles. Normal respiratory effort and patient is not tachypenic. No accessory muscle use.  Cardiovascular: RRR, no murmurs / rubs / gallops. S1 and S2 auscultated. No extremity edema. Abdomen: Soft, non-tender, non-distended. No masses palpated. No appreciable hepatosplenomegaly. Bowel sounds positive x4.  GU: Deferred. Musculoskeletal: No clubbing / cyanosis of digits/nails. No joint deformity upper and lower extremities. Skin: No rashes, lesions, ulcers. No induration; Warm and dry.  Neurologic: CN 2-12 grossly intact with no focal deficits. Sensation intact in all 4 Extremities. Romberg sign cerebellar reflexes not assessed.  Psychiatric: Normal judgment and insight. Alert and oriented x 3. Normal mood and appropriate affect.   Data Reviewed: I have personally reviewed following labs and imaging studies  CBC:  Recent Labs Lab 08/19/16 1349 08/20/16 0312 08/21/16 0552  WBC 8.1 6.4 5.0  NEUTROABS 5.8 4.8  --   HGB 9.6* 8.9* 8.0*  HCT 30.1* 28.3* 25.1*  MCV 85.5 88.4 86.3  PLT 238 198 428*   Basic Metabolic Panel:  Recent  Labs Lab 08/17/16 1606 08/19/16 1349 08/19/16 2039 08/20/16 0312 08/21/16 0552  NA 139 137  --  138 137  K 4.0 3.7  --  3.5 3.4*  CL 99 100*  --  105 109  CO2 23 23  --  23 21*  GLUCOSE 118* 111*  --  117* 101*  BUN 16 22*  --  20 12  CREATININE 1.34* 1.47*  --  1.23* 1.04*  CALCIUM 9.1 8.5*  --  7.8* 7.6*  MG  --   --  1.0*  --   --   PHOS  --   --  2.6  --   --    GFR: Estimated Creatinine Clearance: 46.1 mL/min (A) (by C-G formula based on SCr of 1.04 mg/dL (H)). Liver Function Tests:  Recent Labs Lab 08/17/16 1606 08/19/16 1349 08/20/16 0312  AST 25 32 30  ALT 11 14 13*  ALKPHOS 126 108 91  BILITOT 1.0 1.0 0.9  PROT 7.2 7.7 6.8  ALBUMIN 3.3* 3.0* 2.7*   No results for input(s): LIPASE, AMYLASE in the last 168 hours. No results for input(s): AMMONIA in the last 168 hours. Coagulation Profile:  Recent Labs Lab  08/17/16 1606 08/19/16 2039  INR 1.3* 1.42   Cardiac Enzymes: No results for input(s): CKTOTAL, CKMB, CKMBINDEX, TROPONINI in the last 168 hours. BNP (last 3 results) No results for input(s): PROBNP in the last 8760 hours. HbA1C: No results for input(s): HGBA1C in the last 72 hours. CBG:  Recent Labs Lab 08/20/16 1130 08/20/16 1832 08/20/16 2103 08/21/16 0729 08/21/16 1200  GLUCAP 122* 101* 96 83 167*   Lipid Profile: No results for input(s): CHOL, HDL, LDLCALC, TRIG, CHOLHDL, LDLDIRECT in the last 72 hours. Thyroid Function Tests: No results for input(s): TSH, T4TOTAL, FREET4, T3FREE, THYROIDAB in the last 72 hours. Anemia Panel: No results for input(s): VITAMINB12, FOLATE, FERRITIN, TIBC, IRON, RETICCTPCT in the last 72 hours. Sepsis Labs:  Recent Labs Lab 08/20/16 0312 08/20/16 0604 08/20/16 0947 08/21/16 0552  LATICACIDVEN 2.5* 2.1* 2.6* 1.3    Recent Results (from the past 240 hour(s))  Blood culture (routine x 2)     Status: None (Preliminary result)   Collection Time: 08/19/16  5:41 PM  Result Value Ref Range Status   Specimen Description BLOOD LEFT ANTECUBITAL  Final   Special Requests   Final    BOTTLES DRAWN AEROBIC AND ANAEROBIC Blood Culture adequate volume   Culture   Final    NO GROWTH < 24 HOURS Performed at [facility], [place] [address], [place], [place] [address]    Report Status PENDING  Incomplete  Blood culture (routine x 2)     Status: None (Preliminary result)   Collection Time: 08/19/16  5:49 PM  Result Value Ref Range Status   Specimen Description BLOOD LEFT FOREARM  Final   Special Requests   Final    BOTTLES DRAWN AEROBIC AND ANAEROBIC Blood Culture adequate volume   Culture   Final    NO GROWTH < 24 HOURS Performed at [facility], [place] [address], [place], [place] [address]    Report Status PENDING  Incomplete    Radiology Studies: Dg Chest 2 View  Result Date: 08/19/2016 CLINICAL DATA:  76 year old female with  fever and confusion. Initial encounter. 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 EXAM: CHEST  2 VIEW COMPARISON:  Chest radiographs 07/07/2013 FINDINGS: Semi upright AP and lateral views of the chest. One  of the patient arms is not raised on the lateral view. Mildly lower lung volumes. Stable cardiac size and mediastinal contours. Visualized tracheal air column is within normal limits. No pneumothorax. Pulmonary vascularity appears stable without overt edema. No pleural effusion or confluent opacity. Chronic increased pulmonary interstitial markings are stable. Postoperative changes at the right axilla are chronic. No acute osseous abnormality identified. Calcified aortic atherosclerosis. Negative visible bowel gas pattern. IMPRESSION: 1.  No acute cardiopulmonary abnormality. 2.  Calcified aortic atherosclerosis. Electronically Signed   By: Genevie Ann M.D.   On: 08/19/2016 15:30   Scheduled Meds: . aspirin EC  81 mg Oral Daily  . calcium-vitamin D  1 tablet Oral Q breakfast  . famotidine  20 mg Oral BID  . heparin  5,000 Units Subcutaneous Q8H  . insulin aspart  0-15 Units Subcutaneous TID WC  . insulin NPH Human  30 Units Subcutaneous QAC breakfast  . insulin NPH Human  40 Units Subcutaneous QHS  . magnesium sulfate 1 - 4 g bolus IVPB  4 g Intravenous Once  . metoCLOPramide  5 mg Oral BID  . metoprolol tartrate  25 mg Oral BID  . potassium chloride  40 mEq Oral BID   Continuous Infusions: . 0.9 % NaCl with KCl 20 mEq / L 100 mL/hr at 08/21/16 1324    LOS: 1 day    Kerney Elbe, DO [facility] Pager [phone]  If 7PM-7AM, please contact night-coverage [email or web address] Password [email or web address] 08/21/2016, 2:19 PM

## 2016-08-21 NOTE — Evaluation (Signed)
Physical Therapy Evaluation Patient Details Name: [name] [name] MRN: [id number] DOB: 05-27-1940 Today's Date: 08/21/2016   History of Present Illness  76 yo female admitted with fever, weakness, SIRS. Hx of anemia, sleep apnea, breast cancer, OA, bladder cancer, colon cancer, cirrhosis, DM  Clinical Impression  On eval, pt required Min assist for mobility. She walked ~150 feet with a RW. Pt presents with genera weakness and impaired gait/balance. Husband present during session. Discussed d/c plan-pt will return home. Recommend HHPT follow up. Will follow.     Follow Up Recommendations Home health PT;Supervision - Intermittent    Equipment Recommendations  None recommended by PT    Recommendations for Other Services       Precautions / Restrictions Precautions Precautions: Fall Restrictions Weight Bearing Restrictions: No      Mobility  Bed Mobility Overal bed mobility: Needs Assistance Bed Mobility: Supine to Sit     Supine to sit: Supervision     General bed mobility comments: for safety  Transfers Overall transfer level: Needs assistance Equipment used: Rolling walker (2 wheeled) Transfers: Sit to/from Stand Sit to Stand: Min assist         General transfer comment: Assist to steady.   Ambulation/Gait Ambulation/Gait assistance: Min assist Ambulation Distance (Feet): 165 Feet Assistive device: Rolling walker (2 wheeled) Gait Pattern/deviations: Step-through pattern;Decreased step length - right;Decreased step length - left;Decreased stride length;Trunk flexed     General Gait Details: Assist to steady especially when pt walked in room without a walker.   Stairs            Wheelchair Mobility    Modified Rankin (Stroke Patients Only)       Balance Overall balance assessment: Needs assistance           Standing balance-Leahy Scale: Fair                               Pertinent Vitals/Pain Pain Assessment: No/denies pain     Home Living Family/patient expects to be discharged to:: Private residence Living Arrangements: Spouse/significant other Available Help at Discharge: Family Type of Home: House Home Access: Stairs to enter   Technical brewer of Steps: 1 Home Layout: Two level;Bed/bath upstairs Home Equipment: Walker - 4 wheels      Prior Function           Comments: uses rollator in the community. walks without a device inside home     Hand Dominance        Extremity/Trunk Assessment   Upper Extremity Assessment Upper Extremity Assessment: Overall WFL for tasks assessed    Lower Extremity Assessment Lower Extremity Assessment: Generalized weakness    Cervical / Trunk Assessment Cervical / Trunk Assessment: Normal  Communication   Communication: HOH  Cognition Arousal/Alertness: Awake/alert Behavior During Therapy: WFL for tasks assessed/performed Overall Cognitive Status: Within Functional Limits for tasks assessed                                        General Comments      Exercises     Assessment/Plan    PT Assessment Patient needs continued PT services  PT Problem List Decreased strength;Decreased mobility;Decreased balance;Decreased knowledge of use of DME       PT Treatment Interventions DME instruction;Gait training;Therapeutic activities;Therapeutic exercise;Patient/family education;Balance training;Functional mobility training    PT Goals (Current  goals can be found in the Care Plan section)  Acute Rehab PT Goals Patient Stated Goal: none stated.  PT Goal Formulation: With patient/family Time For Goal Achievement: 09/04/16 Potential to Achieve Goals: Good    Frequency Min 3X/week   Barriers to discharge        Co-evaluation               End of Session Equipment Utilized During Treatment: Gait belt Activity Tolerance: Patient tolerated treatment well Patient left: in bed;with call bell/phone within reach;with  family/visitor present;with bed alarm set   PT Visit Diagnosis: Muscle weakness (generalized) (M62.81);Difficulty in walking, not elsewhere classified (R26.2)    Time: 7943-2761 PT Time Calculation (min) (ACUTE ONLY): 22 min   Charges:   PT Evaluation $PT Eval Low Complexity: 1 Procedure     PT G Codes:          Weston Anna, MPT Pager: [phone]

## 2016-08-22 LAB — COMPREHENSIVE METABOLIC PANEL
ALBUMIN: 2.3 g/dL — AB (ref 3.5–5.0)
ALK PHOS: 109 U/L (ref 38–126)
ALT: 19 U/L (ref 14–54)
AST: 44 U/L — ABNORMAL HIGH (ref 15–41)
Anion gap: 6 (ref 5–15)
BILIRUBIN TOTAL: 1.1 mg/dL (ref 0.3–1.2)
BUN: 9 mg/dL (ref 6–20)
CALCIUM: 7.6 mg/dL — AB (ref 8.9–10.3)
CO2: 19 mmol/L — AB (ref 22–32)
CREATININE: 0.87 mg/dL (ref 0.44–1.00)
Chloride: 113 mmol/L — ABNORMAL HIGH (ref 101–111)
GFR calc Af Amer: >60 mL/min (ref >60)
GFR calc non Af Amer: >60 mL/min (ref >60)
GLUCOSE: 91 mg/dL (ref 65–99)
Potassium: 5 mmol/L (ref 3.5–5.1)
SODIUM: 138 mmol/L (ref 135–145)
Total Protein: 5.9 g/dL — ABNORMAL LOW (ref 6.5–8.1)

## 2016-08-22 LAB — GLUCOSE, CAPILLARY
GLUCOSE-CAPILLARY: 81 mg/dL (ref 65–99)
GLUCOSE-CAPILLARY: 156 mg/dL — AB (ref 65–99)
Glucose-Capillary: 138 mg/dL — ABNORMAL HIGH (ref 65–99)

## 2016-08-22 LAB — CBC WITH DIFFERENTIAL/PLATELET
BASOS PCT: 0 %
Basophils Absolute: 0 10*3/uL (ref 0.0–0.1)
EOS ABS: 0.1 10*3/uL (ref 0.0–0.7)
Eosinophils Relative: 2 %
HEMATOCRIT: 26.3 % — AB (ref 36.0–46.0)
HEMOGLOBIN: 8.4 g/dL — AB (ref 12.0–15.0)
LYMPHS ABS: 0.9 10*3/uL (ref 0.7–4.0)
Lymphocytes Relative: 17 %
MCH: 28.3 pg (ref 26.0–34.0)
MCHC: 31.9 g/dL (ref 30.0–36.0)
MCV: 88.6 fL (ref 78.0–100.0)
MONOS PCT: 11 %
Monocytes Absolute: 0.6 10*3/uL (ref 0.1–1.0)
NEUTROS ABS: 3.8 10*3/uL (ref 1.7–7.7)
NEUTROS PCT: 70 %
Platelets: 177 10*3/uL (ref 150–400)
RBC: 2.97 MIL/uL — ABNORMAL LOW (ref 3.87–5.11)
RDW: 17.9 % — ABNORMAL HIGH (ref 11.5–15.5)
WBC: 5.5 10*3/uL (ref 4.0–10.5)

## 2016-08-22 LAB — PHOSPHORUS: Phosphorus: 1.4 mg/dL — ABNORMAL LOW (ref 2.5–4.6)

## 2016-08-22 LAB — MAGNESIUM: Magnesium: 2.2 mg/dL (ref 1.7–2.4)

## 2016-08-22 MED ORDER — FUROSEMIDE 40 MG PO TABS: 20.0000 mg | ORAL_TABLET | Freq: Every day | ORAL | 0 refills | Status: DC

## 2016-08-22 NOTE — Care Management Note (Signed)
Case Management Note  Patient Details  Name: [name] [name] MRN: [id number] Date of Birth: [birth year]  Subjective/Objective:  Spoke to spouse & dtr in rm for choice of [facility] agency-chose [facility] rep jermaine aware of d/c & HHC orders. No further CM needs.                  Action/Plan:d/c home w/HHC.   Expected Discharge Date:  08/22/16               Expected Discharge Plan:  [place]  In-House Referral:     Discharge planning Services  CM Consult  Post Acute Care Choice:    Choice offered to:  Spouse  DME Arranged:    DME Agency:     HH Arranged:  RN, PT [place] Agency:  [facility]  Status of Service:  Completed, signed off  If discussed at [place] of Stay Meetings, dates discussed:    Additional Comments:  Dessa Phi, RN 08/22/2016, 12:59 PM

## 2016-08-22 NOTE — Discharge Summary (Signed)
Physician Discharge Summary  [name] [name] PJA:[id number] DOB: 1940-08-16 DOA: 08/19/2016  PCP: Alysia Penna, MD  Admit date: 08/19/2016 Discharge date: 08/22/2016  Admitted From: Hom Disposition:  Home with [place] PT and RN  Recommendations for Outpatient Follow-up:  1. Follow up with PCP in 1-2 weeks; Has appointment 08/23/16 at 13:00 2. Follow up with Interventional Radiolgy Dr. Laurence Ferrari for Thermal Ablation [facility] on 09/01/16 3. Adhere to Heart Healthy Carb Modified Diet 4. Discuss with PCP about Reinitiating Meformin 5. Please obtain CMP/CBC in one week 6. Please follow up on the following pending results:  Home Health: YES Equipment/Devices: None Recommended by PT  Discharge Condition: Stable CODE STATUS: FULL CODE Diet recommendation: Heart Healthy / Carb Modified   Brief/Interim Summary: [name] Kingis a 76 y.o.femalewith medical history significant of breast cancer, seasonal allergies, osteoarthritis, (history of breast cancer, bladder cancer, colon cancer), hepatocellular carcinoma, Barrett's esophagus, GERD, gastritis, hemorrhoids, hepatic cirrhosis, IBS, type 2 diabetes, hyperlipidemia, hypertension, iron deficiency anemia, sleep apnea who is coming to the emergency department with a week history of progressively worse weakness, decreased appetite, low-grade fever fever and malaise. She denies headache, sore throat, productive cough, chest pain, palpitations, PND, orthopnea or lower extremities edema. Patient states that she has feels lightheaded and nauseous at times. Admitted for SIRS with no identifiable source of infection but did admit to having some diarrhea after her chemo-embolization. Empiric Abx Stopped and will monitored for fevers off of Abx. Had Slight Temperature yesterday at 100.8 but came down without tylenol. Patient improved and AKI resolved. PT evaluated and recommended Home Health. She will follow up with PCP 4/18//18 and is medically stable to D/C today.    Discharge Diagnoses:  Principal Problem:   Systemic inflammatory response syndrome (SIRS) (HCC) Active Problems:   Diabetes mellitus type 2 in obese ([place])   HYPERLIPIDEMIA   Essential hypertension   GERD   Anemia, unspecified   Hepatocellular carcinoma (HCC)   AKI (acute kidney injury) ([place])   SIRS (systemic inflammatory response syndrome) (HCC)   Hypokalemia   Hypomagnesemia   Diarrhea  SIRS likely 2/2 to Post-Chemoemoblization with no Identifiable Source of Infection noted  -PER IR PA Note Recent Segment 6 Hepatocellular Carcinonma Drug Eluting Beads- TACE 08/01/16 -UA/ CXRNegative -Influenza A/B PCR Negative -Blood culture Show NGTD at 3 days -D/C'd Empiric IV Antibiotics and monitored off Abx -IV fluids hydration while Hospitalized  -Had Temperature of 100.8 Last Night but improved without Tylenol -Lactic acid levels Decreased from 3.1 -> 2.6 -> 1.3 -IR Saw patient today and she is scheduled for tentative Thermal Ablation Seg HCC on 09/01/16 -[place] PT -Follow up with PCP tomorrow   AKI  -Improved BUN/Cr went from 22/1.47 -> 12/1.04 -> 9/0.87 -Likely from Dehydration and Diarrhea, Nausea and poor po Intake -Given  IV Hydration with NS + KCl 20 mEQ at 100 mL/hr -Repeat CMP at PCP's office   Dehydration likely from poor po Intake after DEB- TACE -Improved  Insulin Dependent Diabetes Mellitus Type 2 -C/w Home Insulin NPH 30 units sq daily before breakfast and 40 unit sq daily qHS  -Metformin Held due to Lactic Acidosis and will need to discuss with PCP about Re-initiating -Need to Adhere to Carb Modified Diet -CBG's ranging from 81-156 -Hypoglycemia Protocol in Place  Hypokalemia, improved -Patient's K+ was 5.0 -Repeat CMP in AM at PCP's office  Hypomagnesemia, improved -Mag Level improved to 2.2 -Repeat Mag Level at PCP's office  Hypophosphatemia -Phos level was 1.4 -Recheck  In AM at PCP's office and  replete  Hyperlipidemia -Rosuvastatin 10 mg po held on admission and can restart on D/C  Hypertension -Diuretic with Furosemide 40 mg po Daily held on admission, can restart at D/C after Discussion with PCP -C/w Metoprolol 25 mg po BID  Hepatocellular Carcinoma -Follow up with Oncology as scheduled -Interventional Radiology Following  GERD -Resume Home Meds  Diarrhea, improved -Given IVF; Patient states she has not been on Abx recently but per IR report she was given a prescription for Ciprofloxacin 500 mg po BID x 1 week -Currently being treated with Lomotil 2.5-0.025 2 tab po 4 Times daily prn Diarrhea or Loose stools with Max dose of 8 tablets  -Replete Lytes as needed  -Changed Protonix to Famotidine -If Continues will likely need to check for C Difficile but clinically currently do not suspect C Difficile at this time given no Leukocytosis and intermittently febrile   Anemia, unspecified -Hb/Hct went from 9.6/30.1 -> 8.0/25.1 -> 8.4/26.3 -Suspect Dilutional Component as patient was bolused 2 liters and now on Maintenance Fluid with NS at 100 mL/hr -Repeat CBC in AM at PCP's office   Discharge Instructions  Discharge Instructions    Call MD for:  difficulty breathing, headache or visual disturbances    Complete by:  As directed    Call MD for:  extreme fatigue    Complete by:  As directed    Call MD for:  hives    Complete by:  As directed    Call MD for:  persistant dizziness or light-headedness    Complete by:  As directed    Call MD for:  persistant nausea and vomiting    Complete by:  As directed    Call MD for:  severe uncontrolled pain    Complete by:  As directed    Call MD for:  temperature >100.4    Complete by:  As directed    Diet - low sodium heart healthy    Complete by:  As directed    Increase activity slowly    Complete by:  As directed      Allergies as of 08/22/2016   No Known Allergies     Medication List    STOP taking these  medications   memantine 10 MG tablet Commonly known as:  NAMENDA   metFORMIN 500 MG 24 hr tablet Commonly known as:  GLUCOPHAGE-XR     TAKE these medications   aspirin EC 81 MG tablet Take 81 mg by mouth daily.   CALCIUM 600+D 600-400 MG-UNIT tablet Generic drug:  Calcium Carbonate-Vitamin D Take 1 tablet by mouth daily.   diphenoxylate-atropine 2.5-0.025 MG tablet Commonly known as:  LOMOTIL Take 2 tablets by mouth 4 (four) times daily as needed for diarrhea or loose stools.   furosemide 40 MG tablet Commonly known as:  LASIX Take 0.5 tablets (20 mg total) by mouth daily. What changed:  how much to take   insulin NPH Human 100 UNIT/ML injection Commonly known as:  HUMULIN N,NOVOLIN N Inject 30-40 Units into the skin 2 (two) times daily. Pt uses 30 units in the morning and 40 units at bedtime.   meclizine 25 MG tablet Commonly known as:  ANTIVERT Take 1 tablet (25 mg total) by mouth every 4 (four) hours as needed for dizziness.   metoCLOPramide 5 MG tablet Commonly known as:  REGLAN Take 5 mg by mouth 2 (two) times daily.   metoprolol tartrate 25 MG tablet Commonly known as:  LOPRESSOR TAKE 1 TABLET TWICE DAILY   omeprazole 20 MG capsule Commonly known as:  PRILOSEC TAKE 1 CAPSULE TWICE DAILY BEFORE MEALS   ondansetron 4 MG disintegrating tablet Commonly known as:  ZOFRAN-ODT DISSOLVE ONE TABLET IN MOUTH EVERY 8 HOURS AS NEEDED FOR NAUSEA OR VOMITING What changed:  how much to take  how to take this  when to take this  reasons to take this  additional instructions   potassium chloride SA 20 MEQ tablet Commonly known as:  K-DUR,KLOR-CON Take 1 tablet (20 mEq total) by mouth daily. OVERDUE FOR FOLLOW UP. PLEASE CALL AND SCHEDULE [phone]   rosuvastatin 10 MG tablet Commonly known as:  CRESTOR TAKE 1/2 TABLET EVERY DAY   traMADol 50 MG tablet Commonly known as:  ULTRAM Take 1 tablet (50 mg total) by mouth every 6 (six) hours as needed. What  changed:  reasons to take this      Follow-up [place] Follow up.   Why:  HH nursing,HH physical therapy Contact information: [place] [address] [phone]          No Known Allergies  Consultations:  None  Procedures/Studies: Dg Chest 2 View  Result Date: 08/19/2016 CLINICAL DATA:  76 year old female with fever and confusion. Initial encounter. 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 EXAM: CHEST  2 VIEW COMPARISON:  Chest radiographs 07/07/2013 FINDINGS: Semi upright AP and lateral views of the chest. One of the patient arms is not raised on the lateral view. Mildly lower lung volumes. Stable cardiac size and mediastinal contours. Visualized tracheal air column is within normal limits. No pneumothorax. Pulmonary vascularity appears stable without overt edema. No pleural effusion or confluent opacity. Chronic increased pulmonary interstitial markings are stable. Postoperative changes at the right axilla are chronic. No acute osseous abnormality identified. Calcified aortic atherosclerosis. Negative visible bowel gas pattern. IMPRESSION: 1.  No acute cardiopulmonary abnormality. 2.  Calcified aortic atherosclerosis. Electronically Signed   By: Genevie Ann M.D.   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 She presents for drug-eluting bead chemoembolization prior to percutaneous thermal ablation. EXAM: IR EMBO TUMOR ORGAN ISCHEMIA INFARCT INC GUIDE ROADMAPPING; SELECTIVE VISCERAL ARTERIOGRAPHY; IR ULTRASOUND GUIDANCE VASC ACCESS RIGHT; ADDITIONAL ARTERIOGRAPHY 1. Ultrasound-guided right common femoral arterial access 2. Catheterization of the celiac artery with arteriogram 3. Catheterization of the common hepatic artery with arteriogram  4. Catheterization of the right hepatic artery with arteriogram 5. Catheterization of the segment 6 hepatic artery with arteriogram 6. Drug-eluting bead chemoembolization of the segment 6 hepatic artery MEDICATIONS: 3.375 g Zosyn, 4 mg Zofran and 4 mg Decadron administered. The antibiotic was administered within 1 hour of the procedure ANESTHESIA/SEDATION: Moderate (conscious) sedation was employed during this procedure. A total of Versed 2 mg and Fentanyl 75 mcg was administered intravenously. Moderate Sedation Time: 71 minutes. The patient's level of consciousness and vital signs were monitored continuously by radiology nursing throughout the procedure under my direct supervision. CONTRAST:  91 mL Isovue-300 FLUOROSCOPY TIME:  Fluoroscopy Time: 12 minutes 6 seconds (2,079 mGy). COMPLICATIONS: None immediate. PROCEDURE: Informed consent was obtained from the patient following explanation of the procedure, risks, benefits and alternatives. The patient understands, agrees and consents for the procedure. All questions were addressed. A time out was performed prior to the initiation of the procedure. Maximal barrier sterile technique  utilized including caps, mask, sterile gowns, sterile gloves, large sterile drape, hand hygiene, and Betadine prep. The right common femoral artery was interrogated with ultrasound and found to be widely patent. An image was obtained and stored for the medical record. Local anesthesia was attained by infiltration with 1% lidocaine. A small dermatotomy was made. Under real-time sonographic guidance, the vessel was punctured with a 21 gauge micropuncture needle. Using standard technique, the initial micro needle was exchanged over a 0.018 micro wire for a transitional 4 [place] micro sheath. The micro sheath was then exchanged over a 0.035 wire for a 5 French vascular sheath. A C2 cobra catheter was advanced over a Bentson wire into the abdominal aorta. The catheter was used to select the  celiac artery. A celiac arteriogram was performed. Conventional hepatic arterial anatomy. No evidence of replaced hepatic artery. Hypervascular blushes present in hepatic segment 6 consistent with the known hepatocellular carcinoma. There is a tiny hypervascular blush in hepatic segment 2. No definite hypervascularity in hepatic segment 8. The catheter was advanced into the common hepatic artery. Arteriography was performed in multiple obliquities. 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 Small 1 cm hypervascular lesion in the superior aspect of hepatic segment 2 which is indeterminate. No definite hypervascularity in segment 8. A high-flow Renegade microcatheter was advanced into the right hepatic artery. Arteriography was performed with similar findings as above. The segment 6 hepatic artery was successfully identified. This was then selected with the microcatheter in the microcatheter advanced distally in the artery. Arteriography was performed confirming tumor blush. A cone beam CT was also performed further confirming the location of the tumor when compared with the recent prior MRI. Chemoembolization was then performed using a single vial of 70- 150 micron LUMI beads impregnated with 75 mg doxorubicin. Chemoembolization was performed until near stasis. The catheter was then flushed and removed. Post embolization cone beam CT confirms localization of the radiopaque drug-eluting beads within the tumor. A limited right common femoral arteriogram was performed. Successful common femoral arterial access. A Cordis Exoseal extra arterial vascular plug was applied. IMPRESSION: 1. Successful drug-eluting beads chemoembolization of segment 6 hepatocellular carcinoma. 2. Nonspecific hypervascular blush measuring approximately 1 cm in the superior aspect of hepatic segment 2. Attention on follow-up imaging. 3. No definite hypervascular blush in the segment 8 lesion  identified on prior MRI. Attention on follow-up imaging. PLAN: 1. Admitted for observation. Plan for discharge in the morning barring any unexpected abnormalities. 2. Patient to be scheduled for percutaneous microwave ablation in approximately 2-4 weeks. Signed, Criselda Peaches, MD [facility] Radiology Electronically Signed   By: [name] M.D.   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 She presents for drug-eluting bead chemoembolization prior to percutaneous thermal ablation. EXAM: IR EMBO TUMOR ORGAN ISCHEMIA INFARCT INC GUIDE ROADMAPPING; SELECTIVE VISCERAL ARTERIOGRAPHY; IR ULTRASOUND GUIDANCE VASC ACCESS RIGHT; ADDITIONAL ARTERIOGRAPHY 1. Ultrasound-guided right common femoral arterial access 2. Catheterization of the celiac artery with arteriogram 3. Catheterization of the common hepatic artery with arteriogram 4. Catheterization of the right hepatic artery with arteriogram 5. Catheterization of the segment 6 hepatic artery with arteriogram 6. Drug-eluting bead chemoembolization of the segment 6 hepatic artery MEDICATIONS: 3.375 g Zosyn, 4 mg Zofran and 4 mg Decadron administered. The antibiotic was administered within 1 hour of the procedure ANESTHESIA/SEDATION: Moderate (  conscious) sedation was employed during this procedure. A total of Versed 2 mg and Fentanyl 75 mcg was administered intravenously. Moderate Sedation Time: 71 minutes. The patient's level of consciousness and vital signs were monitored continuously by radiology nursing throughout the procedure under my direct supervision. CONTRAST:  91 mL Isovue-300 FLUOROSCOPY TIME:  Fluoroscopy Time: 12 minutes 6 seconds (2,079 mGy). COMPLICATIONS: None immediate. PROCEDURE: Informed consent was obtained from the patient following  explanation of the procedure, risks, benefits and alternatives. The patient understands, agrees and consents for the procedure. All questions were addressed. A time out was performed prior to the initiation of the procedure. Maximal barrier sterile technique utilized including caps, mask, sterile gowns, sterile gloves, large sterile drape, hand hygiene, and Betadine prep. The right common femoral artery was interrogated with ultrasound and found to be widely patent. An image was obtained and stored for the medical record. Local anesthesia was attained by infiltration with 1% lidocaine. A small dermatotomy was made. Under real-time sonographic guidance, the vessel was punctured with a 21 gauge micropuncture needle. Using standard technique, the initial micro needle was exchanged over a 0.018 micro wire for a transitional 4 [place] micro sheath. The micro sheath was then exchanged over a 0.035 wire for a 5 French vascular sheath. A C2 cobra catheter was advanced over a Bentson wire into the abdominal aorta. The catheter was used to select the celiac artery. A celiac arteriogram was performed. Conventional hepatic arterial anatomy. No evidence of replaced hepatic artery. Hypervascular blushes present in hepatic segment 6 consistent with the known hepatocellular carcinoma. There is a tiny hypervascular blush in hepatic segment 2. No definite hypervascularity in hepatic segment 8. The catheter was advanced into the common hepatic artery. Arteriography was performed in multiple obliquities. 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 Small 1 cm hypervascular lesion in the superior aspect of hepatic segment 2 which is indeterminate. No definite hypervascularity in segment 8. A high-flow Renegade microcatheter was advanced into the right hepatic artery. Arteriography was performed with similar findings as above. The segment 6 hepatic artery was successfully identified. This  was then selected with the microcatheter in the microcatheter advanced distally in the artery. Arteriography was performed confirming tumor blush. A cone beam CT was also performed further confirming the location of the tumor when compared with the recent prior MRI. Chemoembolization was then performed using a single vial of 70- 150 micron LUMI beads impregnated with 75 mg doxorubicin. Chemoembolization was performed until near stasis. The catheter was then flushed and removed. Post embolization cone beam CT confirms localization of the radiopaque drug-eluting beads within the tumor. A limited right common femoral arteriogram was performed. Successful common femoral arterial access. A Cordis Exoseal extra arterial vascular plug was applied. IMPRESSION: 1. Successful drug-eluting beads chemoembolization of segment 6 hepatocellular carcinoma. 2. Nonspecific hypervascular blush measuring approximately 1 cm in the superior aspect of hepatic segment 2. Attention on follow-up imaging. 3. No definite hypervascular blush in the segment 8 lesion identified on prior MRI. Attention on follow-up imaging. PLAN: 1. Admitted for observation. Plan for discharge in the morning barring any unexpected abnormalities. 2. Patient to be scheduled for percutaneous microwave ablation in approximately 2-4 weeks. Signed, Criselda Peaches, MD [facility] Radiology Electronically Signed   By: [name] M.D.   On: 08/01/2016 17:26   Ir Angiogram Selective Each Additional Vessel  Result Date: 08/01/2016 INDICATION: 76 year old female with NASH cirrhosis complicated by hepatocellular carcinoma in  segment 6. She presents for drug-eluting bead chemoembolization prior to percutaneous thermal ablation. EXAM: IR EMBO TUMOR ORGAN ISCHEMIA INFARCT INC GUIDE ROADMAPPING; SELECTIVE VISCERAL ARTERIOGRAPHY; IR ULTRASOUND GUIDANCE VASC ACCESS RIGHT; ADDITIONAL ARTERIOGRAPHY 1. Ultrasound-guided  right common femoral arterial access 2. Catheterization of the celiac artery with arteriogram 3. Catheterization of the common hepatic artery with arteriogram 4. Catheterization of the right hepatic artery with arteriogram 5. Catheterization of the segment 6 hepatic artery with arteriogram 6. Drug-eluting bead chemoembolization of the segment 6 hepatic artery MEDICATIONS: 3.375 g Zosyn, 4 mg Zofran and 4 mg Decadron administered. The antibiotic was administered within 1 hour of the procedure ANESTHESIA/SEDATION: Moderate (conscious) sedation was employed during this procedure. A total of Versed 2 mg and Fentanyl 75 mcg was administered intravenously. Moderate Sedation Time: 71 minutes. The patient's level of consciousness and vital signs were monitored continuously by radiology nursing throughout the procedure under my direct supervision. CONTRAST:  91 mL Isovue-300 FLUOROSCOPY TIME:  Fluoroscopy Time: 12 minutes 6 seconds (2,079 mGy). COMPLICATIONS: None immediate. PROCEDURE: Informed consent was obtained from the patient following explanation of the procedure, risks, benefits and alternatives. The patient understands, agrees and consents for the procedure. All questions were addressed. A time out was performed prior to the initiation of the procedure. Maximal barrier sterile technique utilized including caps, mask, sterile gowns, sterile gloves, large sterile drape, hand hygiene, and Betadine prep. The right common femoral artery was interrogated with ultrasound and found to be widely patent. An image was obtained and stored for the medical record. Local anesthesia was attained by infiltration with 1% lidocaine. A small dermatotomy was made. Under real-time sonographic guidance, the vessel was punctured with a 21 gauge micropuncture needle. Using standard technique, the initial micro needle was exchanged over a 0.018 micro wire for a transitional 4 [place] micro sheath. The micro sheath was then exchanged over a  0.035 wire for a 5 French vascular sheath. A C2 cobra catheter was advanced over a Bentson wire into the abdominal aorta. The catheter was used to select the celiac artery. A celiac arteriogram was performed. Conventional hepatic arterial anatomy. No evidence of replaced hepatic artery. Hypervascular blushes present in hepatic segment 6 consistent with the known hepatocellular carcinoma. There is a tiny hypervascular blush in hepatic segment 2. No definite hypervascularity in hepatic segment 8. The catheter was advanced into the common hepatic artery. Arteriography was performed in multiple obliquities. 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 Small 1 cm hypervascular lesion in the superior aspect of hepatic segment 2 which is indeterminate. No definite hypervascularity in segment 8. A high-flow Renegade microcatheter was advanced into the right hepatic artery. Arteriography was performed with similar findings as above. The segment 6 hepatic artery was successfully identified. This was then selected with the microcatheter in the microcatheter advanced distally in the artery. Arteriography was performed confirming tumor blush. A cone beam CT was also performed further confirming the location of the tumor when compared with the recent prior MRI. Chemoembolization was then performed using a single vial of 70- 150 micron LUMI beads impregnated with 75 mg doxorubicin. Chemoembolization was performed until near stasis. The catheter was then flushed and removed. Post embolization cone beam CT confirms localization of the radiopaque drug-eluting beads within the tumor. A limited right common femoral arteriogram was performed. Successful common femoral arterial access. A Cordis Exoseal extra arterial vascular plug was applied. IMPRESSION: 1. Successful drug-eluting beads chemoembolization of segment 6 hepatocellular carcinoma. 2. Nonspecific hypervascular blush  measuring  approximately 1 cm in the superior aspect of hepatic segment 2. Attention on follow-up imaging. 3. No definite hypervascular blush in the segment 8 lesion identified on prior MRI. Attention on follow-up imaging. PLAN: 1. Admitted for observation. Plan for discharge in the morning barring any unexpected abnormalities. 2. Patient to be scheduled for percutaneous microwave ablation in approximately 2-4 weeks. Signed, Criselda Peaches, MD [facility] Radiology Electronically Signed   By: [name] M.D.   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 She presents for drug-eluting bead chemoembolization prior to percutaneous thermal ablation. EXAM: IR EMBO TUMOR ORGAN ISCHEMIA INFARCT INC GUIDE ROADMAPPING; SELECTIVE VISCERAL ARTERIOGRAPHY; IR ULTRASOUND GUIDANCE VASC ACCESS RIGHT; ADDITIONAL ARTERIOGRAPHY 1. Ultrasound-guided right common femoral arterial access 2. Catheterization of the celiac artery with arteriogram 3. Catheterization of the common hepatic artery with arteriogram 4. Catheterization of the right hepatic artery with arteriogram 5. Catheterization of the segment 6 hepatic artery with arteriogram 6. Drug-eluting bead chemoembolization of the segment 6 hepatic artery MEDICATIONS: 3.375 g Zosyn, 4 mg Zofran and 4 mg Decadron administered. The antibiotic was administered within 1 hour of the procedure ANESTHESIA/SEDATION: Moderate (conscious) sedation was employed during this procedure. A total of Versed 2 mg and Fentanyl 75 mcg was administered intravenously. Moderate Sedation Time: 71 minutes. The patient's level of consciousness and vital signs were monitored continuously by radiology nursing throughout the procedure under my direct supervision. CONTRAST:  91 mL Isovue-300 FLUOROSCOPY TIME:  Fluoroscopy Time: 12  minutes 6 seconds (2,079 mGy). COMPLICATIONS: None immediate. PROCEDURE: Informed consent was obtained from the patient following explanation of the procedure, risks, benefits and alternatives. The patient understands, agrees and consents for the procedure. All questions were addressed. A time out was performed prior to the initiation of the procedure. Maximal barrier sterile technique utilized including caps, mask, sterile gowns, sterile gloves, large sterile drape, hand hygiene, and Betadine prep. The right common femoral artery was interrogated with ultrasound and found to be widely patent. An image was obtained and stored for the medical record. Local anesthesia was attained by infiltration with 1% lidocaine. A small dermatotomy was made. Under real-time sonographic guidance, the vessel was punctured with a 21 gauge micropuncture needle. Using standard technique, the initial micro needle was exchanged over a 0.018 micro wire for a transitional 4 [place] micro sheath. The micro sheath was then exchanged over a 0.035 wire for a 5 French vascular sheath. A C2 cobra catheter was advanced over a Bentson wire into the abdominal aorta. The catheter was used to select the celiac artery. A celiac arteriogram was performed. Conventional hepatic arterial anatomy. No evidence of replaced hepatic artery. Hypervascular blushes present in hepatic segment 6 consistent with the known hepatocellular carcinoma. There is a tiny hypervascular blush in hepatic segment 2. No definite hypervascularity in hepatic segment 8. The catheter was advanced into the common hepatic artery. Arteriography was performed in multiple obliquities. 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 Small 1 cm hypervascular lesion in the superior aspect of hepatic segment 2 which is indeterminate. No definite hypervascularity in segment 8. A high-flow Renegade microcatheter was advanced into the right hepatic  artery. Arteriography was performed with similar findings as above. The segment 6 hepatic artery was successfully identified. This was then selected with the microcatheter in the microcatheter advanced distally in the artery. Arteriography  was performed confirming tumor blush. A cone beam CT was also performed further confirming the location of the tumor when compared with the recent prior MRI. Chemoembolization was then performed using a single vial of 70- 150 micron LUMI beads impregnated with 75 mg doxorubicin. Chemoembolization was performed until near stasis. The catheter was then flushed and removed. Post embolization cone beam CT confirms localization of the radiopaque drug-eluting beads within the tumor. A limited right common femoral arteriogram was performed. Successful common femoral arterial access. A Cordis Exoseal extra arterial vascular plug was applied. IMPRESSION: 1. Successful drug-eluting beads chemoembolization of segment 6 hepatocellular carcinoma. 2. Nonspecific hypervascular blush measuring approximately 1 cm in the superior aspect of hepatic segment 2. Attention on follow-up imaging. 3. No definite hypervascular blush in the segment 8 lesion identified on prior MRI. Attention on follow-up imaging. PLAN: 1. Admitted for observation. Plan for discharge in the morning barring any unexpected abnormalities. 2. Patient to be scheduled for percutaneous microwave ablation in approximately 2-4 weeks. Signed, Criselda Peaches, MD [facility] Radiology Electronically Signed   By: [name] M.D.   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 She presents for drug-eluting bead chemoembolization prior to percutaneous thermal ablation. EXAM: IR EMBO TUMOR ORGAN ISCHEMIA INFARCT INC GUIDE ROADMAPPING;  SELECTIVE VISCERAL ARTERIOGRAPHY; IR ULTRASOUND GUIDANCE VASC ACCESS RIGHT; ADDITIONAL ARTERIOGRAPHY 1. Ultrasound-guided right common femoral arterial access 2. Catheterization of the celiac artery with arteriogram 3. Catheterization of the common hepatic artery with arteriogram 4. Catheterization of the right hepatic artery with arteriogram 5. Catheterization of the segment 6 hepatic artery with arteriogram 6. Drug-eluting bead chemoembolization of the segment 6 hepatic artery MEDICATIONS: 3.375 g Zosyn, 4 mg Zofran and 4 mg Decadron administered. The antibiotic was administered within 1 hour of the procedure ANESTHESIA/SEDATION: Moderate (conscious) sedation was employed during this procedure. A total of Versed 2 mg and Fentanyl 75 mcg was administered intravenously. Moderate Sedation Time: 71 minutes. The patient's level of consciousness and vital signs were monitored continuously by radiology nursing throughout the procedure under my direct supervision. CONTRAST:  91 mL Isovue-300 FLUOROSCOPY TIME:  Fluoroscopy Time: 12 minutes 6 seconds (2,079 mGy). COMPLICATIONS: None immediate. PROCEDURE: Informed consent was obtained from the patient following explanation of the procedure, risks, benefits and alternatives. The patient understands, agrees and consents for the procedure. All questions were addressed. A time out was performed prior to the initiation of the procedure. Maximal barrier sterile technique utilized including caps, mask, sterile gowns, sterile gloves, large sterile drape, hand hygiene, and Betadine prep. The right common femoral artery was interrogated with ultrasound and found to be widely patent. An image was obtained and stored for the medical record. Local anesthesia was attained by infiltration with 1% lidocaine. A small dermatotomy was made. Under real-time sonographic guidance, the vessel was punctured with a 21 gauge micropuncture needle. Using standard technique, the initial micro needle was  exchanged over a 0.018 micro wire for a transitional 4 [place] micro sheath. The micro sheath was then exchanged over a 0.035 wire for a 5 French vascular sheath. A C2 cobra catheter was advanced over a Bentson wire into the abdominal aorta. The catheter was used to select the celiac artery. A celiac arteriogram was performed. Conventional hepatic arterial anatomy. No evidence of replaced hepatic artery. Hypervascular blushes present in hepatic segment 6 consistent with the known  hepatocellular carcinoma. There is a tiny hypervascular blush in hepatic segment 2. No definite hypervascularity in hepatic segment 8. The catheter was advanced into the common hepatic artery. Arteriography was performed in multiple obliquities. 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 Small 1 cm hypervascular lesion in the superior aspect of hepatic segment 2 which is indeterminate. No definite hypervascularity in segment 8. A high-flow Renegade microcatheter was advanced into the right hepatic artery. Arteriography was performed with similar findings as above. The segment 6 hepatic artery was successfully identified. This was then selected with the microcatheter in the microcatheter advanced distally in the artery. Arteriography was performed confirming tumor blush. A cone beam CT was also performed further confirming the location of the tumor when compared with the recent prior MRI. Chemoembolization was then performed using a single vial of 70- 150 micron LUMI beads impregnated with 75 mg doxorubicin. Chemoembolization was performed until near stasis. The catheter was then flushed and removed. Post embolization cone beam CT confirms localization of the radiopaque drug-eluting beads within the tumor. A limited right common femoral arteriogram was performed. Successful common femoral arterial access. A Cordis Exoseal extra arterial vascular plug was applied. IMPRESSION: 1. Successful  drug-eluting beads chemoembolization of segment 6 hepatocellular carcinoma. 2. Nonspecific hypervascular blush measuring approximately 1 cm in the superior aspect of hepatic segment 2. Attention on follow-up imaging. 3. No definite hypervascular blush in the segment 8 lesion identified on prior MRI. Attention on follow-up imaging. PLAN: 1. Admitted for observation. Plan for discharge in the morning barring any unexpected abnormalities. 2. Patient to be scheduled for percutaneous microwave ablation in approximately 2-4 weeks. Signed, Criselda Peaches, MD [facility] Radiology Electronically Signed   By: [name] M.D.   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 She presents for drug-eluting bead chemoembolization prior to percutaneous thermal ablation. EXAM: IR EMBO TUMOR ORGAN ISCHEMIA INFARCT INC GUIDE ROADMAPPING; SELECTIVE VISCERAL ARTERIOGRAPHY; IR ULTRASOUND GUIDANCE VASC ACCESS RIGHT; ADDITIONAL ARTERIOGRAPHY 1. Ultrasound-guided right common femoral arterial access 2. Catheterization of the celiac artery with arteriogram 3. Catheterization of the common hepatic artery with arteriogram 4. Catheterization of the right hepatic artery with arteriogram 5. Catheterization of the segment 6 hepatic artery with arteriogram 6. Drug-eluting bead chemoembolization of the segment 6 hepatic artery MEDICATIONS: 3.375 g Zosyn, 4 mg Zofran and 4 mg Decadron administered. The antibiotic was administered within 1 hour of the procedure ANESTHESIA/SEDATION: Moderate (conscious) sedation was employed during this procedure. A total of Versed 2 mg and Fentanyl 75 mcg was administered intravenously. Moderate Sedation Time: 71 minutes. The patient's level of consciousness and vital signs were monitored  continuously by radiology nursing throughout the procedure under my direct supervision. CONTRAST:  91 mL Isovue-300 FLUOROSCOPY TIME:  Fluoroscopy Time: 12 minutes 6 seconds (2,079 mGy). COMPLICATIONS: None immediate. PROCEDURE: Informed consent was obtained from the patient following explanation of the procedure, risks, benefits and alternatives. The patient understands, agrees and consents for the procedure. All questions were addressed. A time out was performed prior to the initiation of the procedure. Maximal barrier sterile technique utilized including caps, mask, sterile gowns, sterile gloves, large sterile drape, hand hygiene, and Betadine prep. The right common femoral artery was interrogated with ultrasound and found to be widely patent. An image was obtained and stored for the medical record.  Local anesthesia was attained by infiltration with 1% lidocaine. A small dermatotomy was made. Under real-time sonographic guidance, the vessel was punctured with a 21 gauge micropuncture needle. Using standard technique, the initial micro needle was exchanged over a 0.018 micro wire for a transitional 4 [place] micro sheath. The micro sheath was then exchanged over a 0.035 wire for a 5 French vascular sheath. A C2 cobra catheter was advanced over a Bentson wire into the abdominal aorta. The catheter was used to select the celiac artery. A celiac arteriogram was performed. Conventional hepatic arterial anatomy. No evidence of replaced hepatic artery. Hypervascular blushes present in hepatic segment 6 consistent with the known hepatocellular carcinoma. There is a tiny hypervascular blush in hepatic segment 2. No definite hypervascularity in hepatic segment 8. The catheter was advanced into the common hepatic artery. Arteriography was performed in multiple obliquities. 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 Small 1 cm hypervascular lesion in the superior aspect of  hepatic segment 2 which is indeterminate. No definite hypervascularity in segment 8. A high-flow Renegade microcatheter was advanced into the right hepatic artery. Arteriography was performed with similar findings as above. The segment 6 hepatic artery was successfully identified. This was then selected with the microcatheter in the microcatheter advanced distally in the artery. Arteriography was performed confirming tumor blush. A cone beam CT was also performed further confirming the location of the tumor when compared with the recent prior MRI. Chemoembolization was then performed using a single vial of 70- 150 micron LUMI beads impregnated with 75 mg doxorubicin. Chemoembolization was performed until near stasis. The catheter was then flushed and removed. Post embolization cone beam CT confirms localization of the radiopaque drug-eluting beads within the tumor. A limited right common femoral arteriogram was performed. Successful common femoral arterial access. A Cordis Exoseal extra arterial vascular plug was applied. IMPRESSION: 1. Successful drug-eluting beads chemoembolization of segment 6 hepatocellular carcinoma. 2. Nonspecific hypervascular blush measuring approximately 1 cm in the superior aspect of hepatic segment 2. Attention on follow-up imaging. 3. No definite hypervascular blush in the segment 8 lesion identified on prior MRI. Attention on follow-up imaging. PLAN: 1. Admitted for observation. Plan for discharge in the morning barring any unexpected abnormalities. 2. Patient to be scheduled for percutaneous microwave ablation in approximately 2-4 weeks. Signed, Criselda Peaches, MD [facility] Radiology Electronically Signed   By: [name] M.D.   On: 08/01/2016 17:26    Subjective: Seen and examined and was doing better. No nausea or vomiting. Diarrhea improved per patient. No other concerns or complaints at this time and ready to go  home.   Discharge Exam: Vitals:   08/21/16 2135 08/22/16 0541  BP: 123/62 137/74  Pulse: 99 98  Resp: 20 20  Temp: (!) 100.8 F (38.2 C) 98.7 F (37.1 C)   Vitals:   08/21/16 0459 08/21/16 1341 08/21/16 2135 08/22/16 0541  BP: 118/77 (!) 131/59 123/62 137/74  Pulse: 88 92 99 98  Resp: 19 20 20 20   Temp: 98.8 F (37.1 C) 98.9 F (37.2 C) (!) 100.8 F (38.2 C) 98.7 F (37.1 C)  TempSrc: Oral Oral Oral Oral  SpO2: 96% 99% 96% 98%  Weight:      Height:       General: Pt is alert, awake, not in acute distress Cardiovascular: RRR, S1/S2 +, no rubs, no gallops Respiratory: CTA bilaterally, no wheezing, no rhonchi Abdominal: Soft, NT, ND, bowel sounds + Extremities:  no edema, no cyanosis  The results of significant diagnostics from this hospitalization (including imaging, microbiology, ancillary and laboratory) are listed below for reference.    Microbiology: Recent Results (from the past 240 hour(s))  Blood culture (routine x 2)     Status: None (Preliminary result)   Collection Time: 08/19/16  5:41 PM  Result Value Ref Range Status   Specimen Description BLOOD LEFT ANTECUBITAL  Final   Special Requests   Final    BOTTLES DRAWN AEROBIC AND ANAEROBIC Blood Culture adequate volume   Culture   Final    NO GROWTH 3 DAYS Performed at [facility], [address] [place], [place] [address]    Report Status PENDING  Incomplete  Blood culture (routine x 2)     Status: None (Preliminary result)   Collection Time: 08/19/16  5:49 PM  Result Value Ref Range Status   Specimen Description BLOOD LEFT FOREARM  Final   Special Requests   Final    BOTTLES DRAWN AEROBIC AND ANAEROBIC Blood Culture adequate volume   Culture   Final    NO GROWTH 3 DAYS Performed at [facility], [place] [address] [place], [place] [address]    Report Status PENDING  Incomplete    Labs: BNP (last 3 results) No results for input(s): BNP in the last 8760 hours. Basic Metabolic  Panel:  Recent Labs Lab 08/17/16 1606 08/19/16 1349 08/19/16 2039 08/20/16 0312 08/21/16 0546 08/21/16 0552 08/22/16 0540  NA 139 137  --  138  --  137 138  K 4.0 3.7  --  3.5  --  3.4* 5.0  CL 99 100*  --  105  --  109 113*  CO2 23 23  --  23  --  21* 19*  GLUCOSE 118* 111*  --  117*  --  101* 91  BUN 16 22*  --  20  --  12 9  CREATININE 1.34* 1.47*  --  1.23*  --  1.04* 0.87  CALCIUM 9.1 8.5*  --  7.8*  --  7.6* 7.6*  MG  --   --  1.0*  --  1.8  --  2.2  PHOS  --   --  2.6  --  2.0*  --  1.4*   Liver Function Tests:  Recent Labs Lab 08/17/16 1606 08/19/16 1349 08/20/16 0312 08/22/16 0540  AST 25 32 30 44*  ALT 11 14 13* 19  ALKPHOS 126 108 91 109  BILITOT 1.0 1.0 0.9 1.1  PROT 7.2 7.7 6.8 5.9*  ALBUMIN 3.3* 3.0* 2.7* 2.3*   No results for input(s): LIPASE, AMYLASE in the last 168 hours. No results for input(s): AMMONIA in the last 168 hours. CBC:  Recent Labs Lab 08/19/16 1349 08/20/16 0312 08/21/16 0552 08/22/16 0540  WBC 8.1 6.4 5.0 5.5  NEUTROABS 5.8 4.8  --  3.8  HGB 9.6* 8.9* 8.0* 8.4*  HCT 30.1* 28.3* 25.1* 26.3*  MCV 85.5 88.4 86.3 88.6  PLT 238 198 148* 177   Cardiac Enzymes: No results for input(s): CKTOTAL, CKMB, CKMBINDEX, TROPONINI in the last 168 hours. BNP: Invalid input(s): POCBNP CBG:  Recent Labs Lab 08/21/16 1200 08/21/16 1719 08/21/16 2127 08/22/16 0732 08/22/16 1139  GLUCAP 167* 106* 138* 81 156*   D-Dimer No results for input(s): DDIMER in the last 72 hours. Hgb A1c No results for input(s): HGBA1C in the last 72 hours. Lipid Profile No results for input(s): CHOL, HDL, LDLCALC, TRIG, CHOLHDL, LDLDIRECT  in the last 72 hours. Thyroid function studies No results for input(s): TSH, T4TOTAL, T3FREE, THYROIDAB in the last 72 hours.  Invalid input(s): FREET3 Anemia work up No results for input(s): VITAMINB12, FOLATE, FERRITIN, TIBC, IRON, RETICCTPCT in the last 72 hours. Urinalysis    Component Value Date/Time    COLORURINE YELLOW 08/19/2016 1643   APPEARANCEUR HAZY (A) 08/19/2016 1643   LABSPEC 1.012 08/19/2016 1643   PHURINE 5.0 08/19/2016 1643   GLUCOSEU NEGATIVE 08/19/2016 1643   HGBUR NEGATIVE 08/19/2016 1643   HGBUR negative 07/17/2008 1328   BILIRUBINUR NEGATIVE 08/19/2016 1643   KETONESUR NEGATIVE 08/19/2016 1643   PROTEINUR NEGATIVE 08/19/2016 1643   UROBILINOGEN 1.0 04/27/2009 2108   NITRITE NEGATIVE 08/19/2016 1643   LEUKOCYTESUR NEGATIVE 08/19/2016 1643   Sepsis Labs Invalid input(s): PROCALCITONIN,  WBC,  LACTICIDVEN Microbiology Recent Results (from the past 240 hour(s))  Blood culture (routine x 2)     Status: None (Preliminary result)   Collection Time: 08/19/16  5:41 PM  Result Value Ref Range Status   Specimen Description BLOOD LEFT ANTECUBITAL  Final   Special Requests   Final    BOTTLES DRAWN AEROBIC AND ANAEROBIC Blood Culture adequate volume   Culture   Final    NO GROWTH 3 DAYS Performed at [facility], [place] [address], [place], [place] [address]    Report Status PENDING  Incomplete  Blood culture (routine x 2)     Status: None (Preliminary result)   Collection Time: 08/19/16  5:49 PM  Result Value Ref Range Status   Specimen Description BLOOD LEFT FOREARM  Final   Special Requests   Final    BOTTLES DRAWN AEROBIC AND ANAEROBIC Blood Culture adequate volume   Culture   Final    NO GROWTH 3 DAYS Performed at [facility], [place] [address] [place], [place] [address]    Report Status PENDING  Incomplete   Time coordinating discharge: 35 minutes  SIGNED:  Kerney Elbe, DO [facility] 08/22/2016, 10:13 PM Pager [phone]  If 7PM-7AM, please contact night-coverage [email or web address] Password [email or web address]

## 2016-08-23 ENCOUNTER — Other Ambulatory Visit: Payer: Self-pay | Admitting: General Surgery

## 2016-08-23 ENCOUNTER — Encounter: Payer: Self-pay | Admitting: Family Medicine

## 2016-08-23 ENCOUNTER — Ambulatory Visit (INDEPENDENT_AMBULATORY_CARE_PROVIDER_SITE_OTHER): Payer: Medicare Other | Admitting: Family Medicine

## 2016-08-23 VITALS — BP 131/74 | HR 84 | Temp 98.8°F | Ht 65.0 in | Wt 173.0 lb

## 2016-08-23 DIAGNOSIS — C220 Liver cell carcinoma: Secondary | ICD-10-CM | POA: Diagnosis not present

## 2016-08-23 DIAGNOSIS — N179 Acute kidney failure, unspecified: Secondary | ICD-10-CM

## 2016-08-23 DIAGNOSIS — F03A0 Unspecified dementia, mild, without behavioral disturbance, psychotic disturbance, mood disturbance, and anxiety: Secondary | ICD-10-CM

## 2016-08-23 DIAGNOSIS — E1169 Type 2 diabetes mellitus with other specified complication: Secondary | ICD-10-CM

## 2016-08-23 DIAGNOSIS — R6510 Systemic inflammatory response syndrome (SIRS) of non-infectious origin without acute organ dysfunction: Secondary | ICD-10-CM

## 2016-08-23 DIAGNOSIS — I10 Essential (primary) hypertension: Secondary | ICD-10-CM

## 2016-08-23 DIAGNOSIS — E669 Obesity, unspecified: Secondary | ICD-10-CM

## 2016-08-23 DIAGNOSIS — F0390 Unspecified dementia without behavioral disturbance: Secondary | ICD-10-CM | POA: Diagnosis not present

## 2016-08-23 DIAGNOSIS — E876 Hypokalemia: Secondary | ICD-10-CM

## 2016-08-23 DIAGNOSIS — I5022 Chronic systolic (congestive) heart failure: Secondary | ICD-10-CM | POA: Diagnosis not present

## 2016-08-23 NOTE — Patient Instructions (Signed)
WE NOW OFFER   [facility] FAST TRACK!!!  SAME DAY Appointments for ACUTE CARE  Such as: Sprains, Injuries, cuts, abrasions, rashes, muscle pain, joint pain, back pain Colds, flu, sore throats, headache, allergies, cough, fever  Ear pain, sinus and eye infections Abdominal pain, nausea, vomiting, diarrhea, upset stomach Animal/insect bites  3 Easy Ways to Schedule: Walk-In Scheduling Call in scheduling Mychart Sign-up: [email or web address]         

## 2016-08-23 NOTE — Progress Notes (Signed)
Pre visit review using our clinic review tool, if applicable. No additional management support is needed unless otherwise documented below in the visit note. 

## 2016-08-23 NOTE — Progress Notes (Signed)
   Subjective:    Patient ID: [name] [name], female    DOB: 1941/01/13, 76 y.o.   MRN: [id number]  HPI Here with her husband to follow up a hospital stay from 08-19-16 to 08-22-16 for SIRS and a fever of unknown origin. She had acute renal failure and received IV fluids. Her WBC remained normal and her cultures were all negative. She was given antibiotics for a time but these were stopped at DC. She has had no fever at all since she returned home. She was anemic with a Hgb of 8.4. Her potassium dropped and was corrected. The final potassium was 5.0. Her magnesium was also supplemented. Her creatinine went up to 1.47 but came back down to 0.87. Her Lasix was held and her Metformin was held. Her glucoses have actually been well controlled. Her am fasting glucose today was 72. Her NPH insulin dosing is at 30 units in am and 40 in the pm. She was to have had an appt with Dr. Chalmers Cater this morning but this was accidentally cancelled and she is now scheduled to see her tomorrow. The lab at Dr. Almetta Lovely office attempted to draw blood out of both arms this morning but were unsuccessful. She denies any SOB. Her appetite is good. A question was raised in the hospital about her diarrhea, but the family assures me this has not changed for years. 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    Review of Systems  Constitutional: Negative.   Respiratory: Negative.   Cardiovascular: Negative.   Gastrointestinal: Positive for diarrhea. Negative for abdominal distention, abdominal pain, blood in stool, constipation, nausea and vomiting.  Neurological: Negative.   Hematological: Negative.        Objective:   Physical Exam  Constitutional: She appears well-developed and well-nourished.  Cardiovascular: Normal rate, regular rhythm, normal heart sounds and intact distal pulses.   Pulmonary/Chest: Effort normal and breath sounds normal. No respiratory distress. She has no wheezes. She  has no rales.  Abdominal: Soft. Bowel sounds are normal. She exhibits no distension and no mass. There is no tenderness. There is no rebound and no guarding.  Musculoskeletal: She exhibits no edema.  Neurological: She is alert.          Assessment & Plan:  She seems to be back to her baseline now, and the SIRS as resolved. For now she will stay off Lasix and the family will cal [place] if they notice any ankle swelling. We need to draw blood for a BMET, CBC, and Mag level but we agreed not to try again today. She will return early next week for this. As to the question of resuming Metformin, I will leave this decision up to Dr. Chalmers Cater. Home health will be starting some PT with her soon.  Alysia Penna, MD

## 2016-08-24 DIAGNOSIS — E7800 Pure hypercholesterolemia, unspecified: Secondary | ICD-10-CM | POA: Diagnosis not present

## 2016-08-24 DIAGNOSIS — N390 Urinary tract infection, site not specified: Secondary | ICD-10-CM | POA: Diagnosis not present

## 2016-08-24 DIAGNOSIS — E1165 Type 2 diabetes mellitus with hyperglycemia: Secondary | ICD-10-CM | POA: Diagnosis not present

## 2016-08-24 DIAGNOSIS — I10 Essential (primary) hypertension: Secondary | ICD-10-CM | POA: Diagnosis not present

## 2016-08-24 LAB — CULTURE, BLOOD (ROUTINE X 2)
Culture: NO GROWTH
Culture: NO GROWTH
Special Requests: ADEQUATE
Special Requests: ADEQUATE

## 2016-08-25 ENCOUNTER — Telehealth: Payer: Self-pay | Admitting: *Deleted

## 2016-08-25 DIAGNOSIS — F0390 Unspecified dementia without behavioral disturbance: Secondary | ICD-10-CM | POA: Diagnosis not present

## 2016-08-25 DIAGNOSIS — E119 Type 2 diabetes mellitus without complications: Secondary | ICD-10-CM | POA: Diagnosis not present

## 2016-08-25 DIAGNOSIS — Z5181 Encounter for therapeutic drug level monitoring: Secondary | ICD-10-CM | POA: Diagnosis not present

## 2016-08-25 DIAGNOSIS — K589 Irritable bowel syndrome without diarrhea: Secondary | ICD-10-CM | POA: Diagnosis not present

## 2016-08-25 DIAGNOSIS — K7460 Unspecified cirrhosis of liver: Secondary | ICD-10-CM | POA: Diagnosis not present

## 2016-08-25 DIAGNOSIS — Z85038 Personal history of other malignant neoplasm of large intestine: Secondary | ICD-10-CM | POA: Diagnosis not present

## 2016-08-25 DIAGNOSIS — D509 Iron deficiency anemia, unspecified: Secondary | ICD-10-CM | POA: Diagnosis not present

## 2016-08-25 DIAGNOSIS — Z853 Personal history of malignant neoplasm of breast: Secondary | ICD-10-CM | POA: Diagnosis not present

## 2016-08-25 DIAGNOSIS — I10 Essential (primary) hypertension: Secondary | ICD-10-CM | POA: Diagnosis not present

## 2016-08-25 DIAGNOSIS — Z87891 Personal history of nicotine dependence: Secondary | ICD-10-CM | POA: Diagnosis not present

## 2016-08-25 DIAGNOSIS — Z794 Long term (current) use of insulin: Secondary | ICD-10-CM | POA: Diagnosis not present

## 2016-08-25 DIAGNOSIS — C220 Liver cell carcinoma: Secondary | ICD-10-CM | POA: Diagnosis not present

## 2016-08-25 DIAGNOSIS — Z8551 Personal history of malignant neoplasm of bladder: Secondary | ICD-10-CM | POA: Diagnosis not present

## 2016-08-25 DIAGNOSIS — E785 Hyperlipidemia, unspecified: Secondary | ICD-10-CM | POA: Diagnosis not present

## 2016-08-25 NOTE — Telephone Encounter (Signed)
All okay 

## 2016-08-25 NOTE — Patient Instructions (Addendum)
[name] [name]  08/25/2016   Your procedure is scheduled on: 09-01-16  Report to [facility] Main  Entrance to Interventional Radiology on the 1st floor, [place]  Elevators to 3rd floor to [place]  at 1000 AM.    Call this number if you have problems the morning of surgery [phone]   Remember: ONLY 1 PERSON MAY GO WITH YOU TO SHORT STAY TO GET  [place].  Do not eat food or drink liquids :After Midnight.     Take these medicines the morning of surgery with A SIP OF WATER: Metroprolol , Omeprazole (Prilosec), Loratadine (Claritin), and Tramadol as needed.  DO NOT TAKE ANY DIABETIC MEDICATIONS DAY OF YOUR SURGERY                               You may not have any metal on your body including hair pins and              piercings  Do not wear jewelry, make-up, lotions, powders or perfumes, deodorant             Do not wear nail polish.  Do not shave  48 hours prior to surgery.                Do not bring valuables to the hospital. [place].  Contacts, dentures or bridgework may not be worn into surgery.  Leave suitcase in the car. After surgery it may be brought to your room.     Special Instructions: Please bring your mask and tubing for your CPAP machine             Please read over the following fact sheets you were given: _____________________________________________________________________             How to Manage Your Diabetes Before and After Surgery  Why is it important to control my blood sugar before and after surgery? . Improving blood sugar levels before and after surgery helps healing and can limit problems. . A way of improving blood sugar control is eating a healthy diet by: o  Eating less sugar and carbohydrates o  Increasing activity/exercise o  Talking with your doctor about reaching your blood sugar goals . High blood sugars (greater than 180  mg/dL) can raise your risk of infections and slow your recovery, so you will need to focus on controlling your diabetes during the weeks before surgery. . Make sure that the doctor who takes care of your diabetes knows about your planned surgery including the date and location.  How do I manage my blood sugar before surgery? . Check your blood sugar at least 4 times a day, starting 2 days before surgery, to make sure that the level is not too high or low. o Check your blood sugar the morning of your surgery when you wake up and every 2 hours until you get to the Short Stay unit. . If your blood sugar is less than 70 mg/dL, you will need to treat for low blood sugar: o Do not take insulin. o Treat a low blood sugar (less than 70 mg/dL) with  cup of clear juice (cranberry or apple), 4 glucose tablets, OR glucose gel.  o Recheck blood sugar in 15 minutes after treatment (to make sure it is greater than 70 mg/dL). If your blood sugar is not greater than 70 mg/dL on recheck, call [phone] for further instructions. . Report your blood sugar to the short stay nurse when you get to Short Stay.  . If you are admitted to the hospital after surgery: o Your blood sugar will be checked by the staff and you will probably be given insulin after surgery (instead of oral diabetes medicines) to make sure you have good blood sugar levels. o The goal for blood sugar control after surgery is 80-180 mg/dL.   WHAT DO I DO ABOUT MY DIABETES MEDICATION?  Marland Kitchen Do not take oral diabetes medicines (pills) the morning of surgery.  . THE NIGHT BEFORE SURGERY, take    10 units of Humlin N, Novolin N     insulin.       . THE MORNING OF SURGERY, take  10 units of   HumlinN Novolin N      insulin.  . The day of surgery, do not take other diabetes injectables, including Byetta (exenatide), Bydureon (exenatide ER), Victoza (liraglutide), or Trulicity (dulaglutide).  . If your CBG is greater than 220 mg/dL, you may take   of your sliding scale  . (correction) dose of insulin.     Patient Signature:  Date:   Nurse Signature:  Date:   Reviewed and Endorsed by [facility] Patient Education Committee, August 2015[facility] - Preparing for Surgery Before surgery, you can play an important role.  Because skin is not sterile, your skin needs to be as free of germs as possible.  You can reduce the number of germs on your skin by washing with CHG (chlorahexidine gluconate) soap before surgery.  CHG is an antiseptic cleaner which kills germs and bonds with the skin to continue killing germs even after washing. Please DO NOT use if you have an allergy to CHG or antibacterial soaps.  If your skin becomes reddened/irritated stop using the CHG and inform your nurse when you arrive at Short Stay. Do not shave (including legs and underarms) for at least 48 hours prior to the first CHG shower.  You may shave your face/neck. Please follow these instructions carefully:  1.  Shower with CHG Soap the night before surgery and the  morning of Surgery.  2.  If you choose to wash your hair, wash your hair first as usual with your  normal  shampoo.  3.  After you shampoo, rinse your hair and body thoroughly to remove the  shampoo.                           4.  Use CHG as you would any other liquid soap.  You can apply chg directly  to the skin and wash                       Gently with a scrungie or clean washcloth.  5.  Apply the CHG Soap to your body ONLY FROM THE NECK DOWN.   Do not use on face/ open                           Wound or open sores. Avoid contact with eyes, ears mouth and genitals (private parts).  Wash face,  Genitals (private parts) with your normal soap.             6.  Wash thoroughly, paying special attention to the area where your surgery  will be performed.  7.  Thoroughly rinse your body with warm water from the neck down.  8.  DO NOT shower/wash with your normal soap after using and rinsing  off  the CHG Soap.                9.  Pat yourself dry with a clean towel.            10.  Wear clean pajamas.            11.  Place clean sheets on your bed the night of your first shower and do not  sleep with pets. Day of Surgery : Do not apply any lotions/deodorants the morning of surgery.  Please wear clean clothes to the hospital/surgery center.  FAILURE TO FOLLOW THESE INSTRUCTIONS MAY RESULT IN THE CANCELLATION OF YOUR SURGERY PATIENT SIGNATURE_________________________________  NURSE SIGNATURE__________________________________  ________________________________________________________________________

## 2016-08-25 NOTE — Telephone Encounter (Signed)
Cindy from [facility] called to report initiation of home health services; Nursing to evaluate and treat once per week x 6 weeks; PT to evaluate and treat; Vitals at home health admission 110/60, hr 68, wt 167.7 on home scale, fbs 82; patient taking all medications as prescribed; lungs have faint crackles in lower lobes, no edema, patient continues to not take lasix per MD instructions, home health requesting approval of home health plan of care and wishes to obtain any needed labs next week in home vs. Patient coming in to office. Please advise; verbal orders to be called to [facility] at [phone]

## 2016-08-26 DIAGNOSIS — K589 Irritable bowel syndrome without diarrhea: Secondary | ICD-10-CM | POA: Diagnosis not present

## 2016-08-26 DIAGNOSIS — K7460 Unspecified cirrhosis of liver: Secondary | ICD-10-CM | POA: Diagnosis not present

## 2016-08-26 DIAGNOSIS — F0390 Unspecified dementia without behavioral disturbance: Secondary | ICD-10-CM | POA: Diagnosis not present

## 2016-08-26 DIAGNOSIS — I10 Essential (primary) hypertension: Secondary | ICD-10-CM | POA: Diagnosis not present

## 2016-08-26 DIAGNOSIS — C220 Liver cell carcinoma: Secondary | ICD-10-CM | POA: Diagnosis not present

## 2016-08-26 DIAGNOSIS — E119 Type 2 diabetes mellitus without complications: Secondary | ICD-10-CM | POA: Diagnosis not present

## 2016-08-28 ENCOUNTER — Encounter (HOSPITAL_COMMUNITY): Payer: Self-pay

## 2016-08-28 ENCOUNTER — Encounter (HOSPITAL_COMMUNITY)
Admission: RE | Admit: 2016-08-28 | Discharge: 2016-08-28 | Disposition: A | Discharge status: Home / Self Care | Payer: Medicare Other | Source: Ambulatory Visit | Attending: Interventional Radiology | Admitting: Interventional Radiology

## 2016-08-28 DIAGNOSIS — Z853 Personal history of malignant neoplasm of breast: Secondary | ICD-10-CM | POA: Diagnosis not present

## 2016-08-28 DIAGNOSIS — F0390 Unspecified dementia without behavioral disturbance: Secondary | ICD-10-CM | POA: Insufficient documentation

## 2016-08-28 DIAGNOSIS — E119 Type 2 diabetes mellitus without complications: Secondary | ICD-10-CM | POA: Insufficient documentation

## 2016-08-28 DIAGNOSIS — Z01812 Encounter for preprocedural laboratory examination: Secondary | ICD-10-CM | POA: Insufficient documentation

## 2016-08-28 DIAGNOSIS — K589 Irritable bowel syndrome without diarrhea: Secondary | ICD-10-CM | POA: Diagnosis not present

## 2016-08-28 DIAGNOSIS — G4730 Sleep apnea, unspecified: Secondary | ICD-10-CM | POA: Insufficient documentation

## 2016-08-28 DIAGNOSIS — K219 Gastro-esophageal reflux disease without esophagitis: Secondary | ICD-10-CM | POA: Diagnosis not present

## 2016-08-28 DIAGNOSIS — I5032 Chronic diastolic (congestive) heart failure: Secondary | ICD-10-CM | POA: Insufficient documentation

## 2016-08-28 DIAGNOSIS — E785 Hyperlipidemia, unspecified: Secondary | ICD-10-CM | POA: Diagnosis not present

## 2016-08-28 DIAGNOSIS — I10 Essential (primary) hypertension: Secondary | ICD-10-CM | POA: Diagnosis not present

## 2016-08-28 LAB — CBC WITH DIFFERENTIAL/PLATELET
BASOS ABS: 0 10*3/uL (ref 0.0–0.1)
BASOS PCT: 1 %
EOS ABS: 0.1 10*3/uL (ref 0.0–0.7)
EOS PCT: 2 %
HCT: 27.6 % — ABNORMAL LOW (ref 36.0–46.0)
Hemoglobin: 8.4 g/dL — ABNORMAL LOW (ref 12.0–15.0)
Lymphocytes Relative: 21 %
Lymphs Abs: 1 10*3/uL (ref 0.7–4.0)
MCH: 26.8 pg (ref 26.0–34.0)
MCHC: 30.4 g/dL (ref 30.0–36.0)
MCV: 87.9 fL (ref 78.0–100.0)
MONO ABS: 0.7 10*3/uL (ref 0.1–1.0)
MONOS PCT: 14 %
Neutro Abs: 2.9 10*3/uL (ref 1.7–7.7)
Neutrophils Relative %: 62 %
Platelets: 181 10*3/uL (ref 150–400)
RBC: 3.14 MIL/uL — ABNORMAL LOW (ref 3.87–5.11)
RDW: 18.2 % — AB (ref 11.5–15.5)
WBC: 4.7 10*3/uL (ref 4.0–10.5)

## 2016-08-28 LAB — COMPREHENSIVE METABOLIC PANEL
ALK PHOS: 133 U/L — AB (ref 38–126)
ALT: 18 U/L (ref 14–54)
AST: 38 U/L (ref 15–41)
Albumin: 2.7 g/dL — ABNORMAL LOW (ref 3.5–5.0)
Anion gap: 8 (ref 5–15)
BUN: 11 mg/dL (ref 6–20)
CALCIUM: 9.8 mg/dL (ref 8.9–10.3)
CO2: 24 mmol/L (ref 22–32)
CREATININE: 0.98 mg/dL (ref 0.44–1.00)
Chloride: 107 mmol/L (ref 101–111)
GFR, EST NON AFRICAN AMERICAN: 55 mL/min — AB (ref >60)
Glucose, Bld: 140 mg/dL — ABNORMAL HIGH (ref 65–99)
Potassium: 4.2 mmol/L (ref 3.5–5.1)
SODIUM: 139 mmol/L (ref 135–145)
Total Bilirubin: 0.8 mg/dL (ref 0.3–1.2)
Total Protein: 6.7 g/dL (ref 6.5–8.1)

## 2016-08-28 LAB — PROTIME-INR
INR: 1.28
Prothrombin Time: 16.1 seconds — ABNORMAL HIGH (ref 11.4–15.2)

## 2016-08-28 LAB — ABO/RH: ABO/RH(D): O NEG

## 2016-08-28 LAB — GLUCOSE, CAPILLARY: GLUCOSE-CAPILLARY: 158 mg/dL — AB (ref 65–99)

## 2016-08-28 NOTE — Progress Notes (Signed)
At [place] visit, daughter concerned that pt has lost 6.5lbs since she was in the hospital on 08-19-16. Daughter questioning if pt is dehydrated. Advised daughter that labs would be drawn today. However, she would need to contact pt's physician regarding her concern about her weight lost.

## 2016-08-28 NOTE — Telephone Encounter (Signed)
Advised Feliz Beam, RN of approval of poc

## 2016-08-28 NOTE — ED Provider Notes (Addendum)
[facility] DEPT Provider Note   CSN: [id number] Arrival date & time: 08/19/16  1304     History   Chief Complaint Chief Complaint  Patient presents with  . Fever  . Cancer    HPI [name] [name] is a 76 y.o. female.  HPI   76 y.o. female with medical history significant of breast cancer, seasonal allergies, osteoarthritis, (history of breast cancer, bladder cancer, colon cancer), hepatocellular carcinoma, Barrett's esophagus, GERD, gastritis, hemorrhoids, hepatic cirrhosis, IBS, type 2 diabetes, hyperlipidemia, hypertension, iron deficiency anemia, sleep apnea who is coming to the emergency department with a week history of progressively worse weakness, decreased appetite, low-grade fever fever and malaise.  Past Medical History:  Diagnosis Date  . Adenocarcinoma, breast [facility])    sees Dr. Marcy Panning   . Allergy    environmental  . Arthritis    sees Dr. Hurley Cisco   . Barrett esophagus 12/20/11  . Bladder cancer ([place])   . Breast cancer ([place])   . Cancer, hepatocellular ([place])   . Dementia    mild  . Diabetes mellitus    sees Dr. Chalmers Cater   . Diverticulosis of colon (without mention of hemorrhage) 09/26/2004,09/10/2006  . Gastritis   . GERD (gastroesophageal reflux disease)   . Hemorrhoids   . Hepatic cirrhosis ([place])   . Hepatic steatosis   . Hiatal hernia   . Hyperglycemia   . Hyperlipidemia   . Hypertension   . IBS (irritable bowel syndrome)   . Inflammatory polyps of colon ([place])   . Iron deficiency anemia   . Malignant neoplasm of colon, unspecified site 07/08/2003  . Personal history of chemotherapy   . Personal history of radiation therapy   . Sleep apnea    sees Dr. Gwenette Greet   . Transaminase or LDH elevation    sees Dr. Zenovia Jarred     Patient Active Problem List   Diagnosis Date Noted  . Hypokalemia 08/21/2016  . Hypomagnesemia 08/21/2016  . Diarrhea 08/21/2016  . SIRS (systemic inflammatory response syndrome) ([place]) 08/20/2016  . Systemic  inflammatory response syndrome (SIRS) ([place]) 08/19/2016  . AKI (acute kidney injury) ([place]) 08/19/2016  . Hepatocellular carcinoma ([place]) 08/01/2016  . Abnormal angiogram 08/01/2016  . Mild dementia 03/28/2016  . Mild cognitive impairment 12/22/2015  . Memory loss or impairment 06/22/2015  . Nausea with vomiting 02/07/2014  . Fatty liver 07/28/2013  . Barrett's esophagus 07/28/2013  . Acute on chronic diastolic HF (heart failure) ([place]) 07/10/2013  . Chest pain 07/08/2013  . Dyspnea on exertion 07/07/2013  . Exertional angina ([place]) 07/07/2013  . CHF (congestive heart failure) ([place]) 07/07/2013  . Lumbar radiculopathy 05/06/2013  . Anemia, unspecified 05/06/2013  . OSA (obstructive sleep apnea)- C-pap 02/04/2013  . Arthralgia of multiple joints 12/13/2012  . DIZZINESS 05/25/2010  . POSTHERPETIC NEURALGIA 10/26/2009  . GERD 05/18/2008  . HIATAL HERNIA 05/18/2008  . DIVERTICULITIS, HX OF 05/18/2008  . Essential hypertension 05/11/2008  . COLON CANCER, HX OF 05/11/2008  . SKIN CANCER, HX OF 05/11/2008  . Malignant neoplasm of central portion of right female breast ([place]) 04/25/2007  . BLADDER CANCER , UNSPEC. 04/25/2007  . Diabetes mellitus type 2 in obese ([place]) 04/25/2007  . HYPERLIPIDEMIA 04/25/2007  . DIVERTICULOSIS, COLON 08/13/2006  . IBS 08/13/2006  . ELEVATION, TRANSAMINASE/LDH LEVELS 08/13/2006    Past Surgical History:  Procedure Laterality Date  . bladder cancer 2001    . BREAST BIOPSY    . BREAST SURGERY  2001   lymph nodes removed  and rt mastectomy  . CARPAL TUNNEL RELEASE    . [place]   for diverticulitis  . COLONOSCOPY    . ESOPHAGEAL DILATION    . incisonal hernia    . IR GENERIC HISTORICAL  04/25/2016   IR RADIOLOGIST EVAL & MGMT 04/25/2016 [name], MD [facility] INTERV RAD  . IR GENERIC HISTORICAL  08/01/2016   IR 3D INDEPENDENT [facility]KST 08/01/2016 [facility]INTER[facility]  . IR GENERIC HISTORICAL  08/01/2016   IR ANGIOGRAM VISCERAL SELECTIVE 08/01/2016  [facility]  . IR GENERIC HISTORICAL  08/01/2016   IR EMBO TUMOR ORGAN ISCHEMIA INFARCT INC GUIDE ROADMAPPING 08/01/2016 [facility]  . IR GENERIC HISTORICAL  08/01/2016   IR US GUIDE VASC ACCESS RIGHT 08/01/2016 [facility]  . IR GENERIC HISTORICAL  08/01/2016   IR ANGIOGRAM SELECTIVE EACH ADDITIONAL VESSEL 08/01/2016 [facility]  . IR GENERIC HISTORICAL  08/01/2016   IR ANGIOGRAM SELECTIVE EACH ADDITIONAL VESSEL 08/01/2016 [facility]  . MASTECTOMY    . mastectomy and biopsy    . sigmoid colectomy 2005     for cancer  . squamous cell CA  R  calf  2008      OB History    No data available       Home Medications    Prior to Admission medications   Medication Sig Start Date End Date Taking? Authorizing Provider  aspirin EC 81 MG tablet Take 81 mg by mouth daily.    Yes Historical Provider, MD  Calcium Carbonate-Vitamin D (CALCIUM 600+D) 600-400 MG-UNIT tablet Take 1 tablet by mouth daily.   Yes Historical Provider, MD  diphenoxylate-atropine (LOMOTIL) 2.5-0.025 MG tablet Take 2 tablets by mouth 4 (four) times daily as needed for diarrhea or loose stools. 06/15/16  Yes Jerene Bears, MD  insulin NPH Human (HUMULIN N,NOVOLIN N) 100 UNIT/ML injection Inject 20 Units into the skin 2 (two) times daily with a meal.    Yes Historical Provider, MD  meclizine (ANTIVERT) 25 MG tablet Take 1 tablet (25 mg total) by mouth every 4 (four) hours as needed for dizziness. 05/13/14  Yes [name], MD  metoCLOPramide (REGLAN) 5 MG tablet Take 5 mg by mouth 2 (two) times daily with a meal.  08/18/16  Yes Historical Provider, MD  metoprolol tartrate (LOPRESSOR) 25 MG tablet TAKE 1 TABLET TWICE DAILY 04/24/16  Yes Lorretta Harp, MD  omeprazole (PRILOSEC) 20 MG capsule TAKE 1 CAPSULE TWICE DAILY BEFORE MEALS 06/22/16  Yes Jerene Bears, MD  ondansetron (ZOFRAN-ODT) 4 MG disintegrating tablet DISSOLVE ONE TABLET IN MOUTH EVERY 8 HOURS AS NEEDED FOR NAUSEA OR VOMITING Patient taking differently: Take 4 mg  by mouth every 8 (eight) hours as needed for nausea or vomiting.  06/15/16  Yes Jerene Bears, MD  potassium chloride SA (K-DUR,KLOR-CON) 20 MEQ tablet Take 1 tablet (20 mEq total) by mouth daily. OVERDUE FOR FOLLOW UP. PLEASE CALL AND SCHEDULE [phone] 08/07/16  Yes Lorretta Harp, MD  rosuvastatin (CRESTOR) 10 MG tablet TAKE 1/2 TABLET EVERY DAY Patient taking differently: TAKE 5 MG EVERY EVENING 10/15/15  Yes [name], MD  traMADol (ULTRAM) 50 MG tablet Take 1 tablet (50 mg total) by mouth every 6 (six) hours as needed. Patient taking differently: Take 50 mg by mouth every 6 (six) hours as needed for moderate pain.  03/11/15  Yes [name], MD  diphenhydrAMINE (BENADRYL) 25 MG tablet Take 25 mg by mouth at bedtime.    Historical  Provider, MD  furosemide (LASIX) 40 MG tablet Take 0.5 tablets (20 mg total) by mouth daily. 08/22/16   Jerome, DO  loratadine (CLARITIN) 10 MG tablet Take 10 mg by mouth daily.    Historical Provider, MD  Omega-3 Fatty Acids (FISH OIL PO) Take 1 capsule by mouth every evening.     Historical Provider, MD    Family History Family History  Problem Relation Age of Onset  . Diabetes Mother   . Skin cancer Mother   . Lung cancer Father   . Lymphoma Sister   . Hypertension Sister   . Breast cancer Sister   . Diabetes Sister   . Hypertension Sister   . Breast cancer Sister   . Diabetes Sister   . Hypertension Sister   . Inflammatory bowel disease Daughter   . Esophageal cancer Son   . Stroke Neg Hx   . Colon cancer Neg Hx     Social History Social History  Substance Use Topics  . Smoking status: Former Smoker    Packs/day: 2.00    Years: 20.00    Types: Cigarettes    Quit date: 05/09/1975  . Smokeless tobacco: Never Used     Comment: smoked 1958-1977, up to 2 ppd  . Alcohol use 0.0 oz/week     Comment: rare     Allergies   Patient has no known allergies.   Review of Systems Review of Systems  All systems reviewed and  negative, other than as noted in HPI.   Physical Exam Updated Vital Signs BP 137/74 (BP Location: Left Arm)   Pulse 98   Temp 98.7 F (37.1 C) (Oral)   Resp 20   Ht 5\' 5"  (1.651 m)   Wt 155 lb 14.4 oz (70.7 kg)   SpO2 98%   BMI 25.94 kg/m   Physical Exam  Constitutional: She appears well-developed and well-nourished. No distress.  Tired appearing but not distressed.   HENT:  Head: Normocephalic and atraumatic.  Eyes: Conjunctivae are normal. Right eye exhibits no discharge. Left eye exhibits no discharge.  Neck: Neck supple.  Cardiovascular: Normal rate, regular rhythm and normal heart sounds.  Exam reveals no gallop and no friction rub.   No murmur heard. Pulmonary/Chest: Effort normal and breath sounds normal. No respiratory distress.  Abdominal: Soft. She exhibits no distension. There is no tenderness.  Multiple surgical scars. nt  Musculoskeletal: She exhibits no edema or tenderness.  Neurological: She is alert.  Skin: Skin is warm and dry.  Psychiatric: She has a normal mood and affect. Her behavior is normal. Thought content normal.  Nursing note and vitals reviewed.    ED Treatments / Results  Labs (all labs ordered are listed, but only abnormal results are displayed) Labs Reviewed  COMPREHENSIVE METABOLIC PANEL - Abnormal; Notable for the following:       Result Value   Chloride 100 (*)    Glucose, Bld 111 (*)    BUN 22 (*)    Creatinine, Ser 1.47 (*)    Calcium 8.5 (*)    Albumin 3.0 (*)    GFR calc non Af Amer 34 (*)    GFR calc Af Amer 39 (*)    All other components within normal limits  CBC WITH DIFFERENTIAL/PLATELET - Abnormal; Notable for the following:    RBC 3.52 (*)    Hemoglobin 9.6 (*)    HCT 30.1 (*)    RDW 17.6 (*)    Monocytes Absolute 1.1 (*)  All other components within normal limits  URINALYSIS, ROUTINE W REFLEX MICROSCOPIC - Abnormal; Notable for the following:    APPearance HAZY (*)    All other components within normal limits    LACTIC ACID, PLASMA - Abnormal; Notable for the following:    Lactic Acid, Venous 2.5 (*)    All other components within normal limits  LACTIC ACID, PLASMA - Abnormal; Notable for the following:    Lactic Acid, Venous 3.1 (*)    All other components within normal limits  PROTIME-INR - Abnormal; Notable for the following:    Prothrombin Time 17.5 (*)    All other components within normal limits  CBC WITH DIFFERENTIAL/PLATELET - Abnormal; Notable for the following:    RBC 3.20 (*)    Hemoglobin 8.9 (*)    HCT 28.3 (*)    RDW 17.6 (*)    All other components within normal limits  COMPREHENSIVE METABOLIC PANEL - Abnormal; Notable for the following:    Glucose, Bld 117 (*)    Creatinine, Ser 1.23 (*)    Calcium 7.8 (*)    Albumin 2.7 (*)    ALT 13 (*)    GFR calc non Af Amer 42 (*)    GFR calc Af Amer 48 (*)    All other components within normal limits  MAGNESIUM - Abnormal; Notable for the following:    Magnesium 1.0 (*)    All other components within normal limits  GLUCOSE, CAPILLARY - Abnormal; Notable for the following:    Glucose-Capillary 145 (*)    All other components within normal limits  LACTIC ACID, PLASMA - Abnormal; Notable for the following:    Lactic Acid, Venous 2.5 (*)    All other components within normal limits  LACTIC ACID, PLASMA - Abnormal; Notable for the following:    Lactic Acid, Venous 2.1 (*)    All other components within normal limits  LACTIC ACID, PLASMA - Abnormal; Notable for the following:    Lactic Acid, Venous 2.6 (*)    All other components within normal limits  GLUCOSE, CAPILLARY - Abnormal; Notable for the following:    Glucose-Capillary 61 (*)    All other components within normal limits  GLUCOSE, CAPILLARY - Abnormal; Notable for the following:    Glucose-Capillary 103 (*)    All other components within normal limits  GLUCOSE, CAPILLARY - Abnormal; Notable for the following:    Glucose-Capillary 122 (*)    All other components within  normal limits  BASIC METABOLIC PANEL - Abnormal; Notable for the following:    Potassium 3.4 (*)    CO2 21 (*)    Glucose, Bld 101 (*)    Creatinine, Ser 1.04 (*)    Calcium 7.6 (*)    GFR calc non Af Amer 51 (*)    GFR calc Af Amer 59 (*)    All other components within normal limits  CBC - Abnormal; Notable for the following:    RBC 2.91 (*)    Hemoglobin 8.0 (*)    HCT 25.1 (*)    RDW 17.4 (*)    Platelets 148 (*)    All other components within normal limits  GLUCOSE, CAPILLARY - Abnormal; Notable for the following:    Glucose-Capillary 101 (*)    All other components within normal limits  GLUCOSE, CAPILLARY - Abnormal; Notable for the following:    Glucose-Capillary 167 (*)    All other components within normal limits  PHOSPHORUS - Abnormal; Notable for the following:  Phosphorus 2.0 (*)    All other components within normal limits  CBC WITH DIFFERENTIAL/PLATELET - Abnormal; Notable for the following:    RBC 2.97 (*)    Hemoglobin 8.4 (*)    HCT 26.3 (*)    RDW 17.9 (*)    All other components within normal limits  COMPREHENSIVE METABOLIC PANEL - Abnormal; Notable for the following:    Chloride 113 (*)    CO2 19 (*)    Calcium 7.6 (*)    Total Protein 5.9 (*)    Albumin 2.3 (*)    AST 44 (*)    All other components within normal limits  PHOSPHORUS - Abnormal; Notable for the following:    Phosphorus 1.4 (*)    All other components within normal limits  GLUCOSE, CAPILLARY - Abnormal; Notable for the following:    Glucose-Capillary 106 (*)    All other components within normal limits  GLUCOSE, CAPILLARY - Abnormal; Notable for the following:    Glucose-Capillary 138 (*)    All other components within normal limits  GLUCOSE, CAPILLARY - Abnormal; Notable for the following:    Glucose-Capillary 156 (*)    All other components within normal limits  I-STAT CG4 LACTIC ACID, ED - Abnormal; Notable for the following:    Lactic Acid, Venous 4.34 (*)    All other  components within normal limits  I-STAT CG4 LACTIC ACID, ED - Abnormal; Notable for the following:    Lactic Acid, Venous 3.51 (*)    All other components within normal limits  CULTURE, BLOOD (ROUTINE X 2)  CULTURE, BLOOD (ROUTINE X 2)  INFLUENZA PANEL BY PCR (TYPE A & B)  APTT  PHOSPHORUS  GLUCOSE, CAPILLARY  LACTIC ACID, PLASMA  GLUCOSE, CAPILLARY  GLUCOSE, CAPILLARY  MAGNESIUM  MAGNESIUM  GLUCOSE, CAPILLARY    EKG  EKG Interpretation  Date/Time:  Saturday August 19 2016 14:42:35 EDT Ventricular Rate:  86 PR Interval:    QRS Duration: 92 QT Interval:  427 QTC Calculation: 511 R Axis:   36 Text Interpretation:  Sinus rhythm Prolonged QT interval Baseline wander in lead(s) V2 Confirmed by Wilson Singer  MD, Wray Goehring ([id number]) on 08/19/2016 5:11:22 PM       Radiology No results found.  Procedures Procedures (including critical care time)  Medications Ordered in ED Medications  sodium chloride 0.9 % bolus 1,000 mL (0 mLs Intravenous Stopped 08/19/16 1610)  sodium chloride 0.9 % bolus 1,000 mL (1,000 mLs Intravenous New Bag/Given 08/19/16 1741)  sodium chloride 0.9 % bolus 1,000 mL (1,000 mLs Intravenous Given 08/19/16 2033)  magnesium sulfate IVPB 4 g 100 mL (4 g Intravenous Given 08/19/16 2325)  sodium chloride 0.9 % bolus 1,000 mL (1,000 mLs Intravenous Given 08/20/16 0030)  magnesium sulfate IVPB 4 g 100 mL (4 g Intravenous Given 08/21/16 1724)  potassium chloride SA (K-DUR,KLOR-CON) CR tablet 40 mEq (40 mEq Oral Given 08/21/16 2256)  vitamin A & D ointment (5 application  Given 0/76/80 2257)     Initial Impression / Assessment and Plan / ED Course  I have reviewed the triage vital signs and the nursing notes.  Pertinent labs & imaging results that were available during my care of the patient were reviewed by me and considered in my medical decision making (see chart for details).     75yF with SIRS. If infectious, source not clear. IVF. Cultures. Admit.   Final Clinical  Impressions(s) / ED Diagnoses   Final diagnoses:  SIRS (systemic inflammatory response syndrome) ([place])  New Prescriptions Discharge Medication List as of 08/22/2016  1:31 PM       Virgel Manifold, MD 08/28/16 1537    Virgel Manifold, MD 08/28/16 1538

## 2016-08-28 NOTE — Telephone Encounter (Signed)
Provided orders for home health to draw CBC, BMET, and Mag level at next home health visit.

## 2016-08-29 ENCOUNTER — Other Ambulatory Visit: Payer: Medicare Other

## 2016-08-29 ENCOUNTER — Telehealth: Payer: Self-pay | Admitting: Family Medicine

## 2016-08-29 DIAGNOSIS — K7460 Unspecified cirrhosis of liver: Secondary | ICD-10-CM | POA: Diagnosis not present

## 2016-08-29 DIAGNOSIS — K589 Irritable bowel syndrome without diarrhea: Secondary | ICD-10-CM | POA: Diagnosis not present

## 2016-08-29 DIAGNOSIS — F0390 Unspecified dementia without behavioral disturbance: Secondary | ICD-10-CM | POA: Diagnosis not present

## 2016-08-29 DIAGNOSIS — C220 Liver cell carcinoma: Secondary | ICD-10-CM | POA: Diagnosis not present

## 2016-08-29 DIAGNOSIS — E119 Type 2 diabetes mellitus without complications: Secondary | ICD-10-CM | POA: Diagnosis not present

## 2016-08-29 DIAGNOSIS — I10 Essential (primary) hypertension: Secondary | ICD-10-CM | POA: Diagnosis not present

## 2016-08-29 LAB — HEMOGLOBIN A1C
HEMOGLOBIN A1C: 6.1 % — AB (ref 4.8–5.6)
MEAN PLASMA GLUCOSE: 128 mg/dL

## 2016-08-29 NOTE — Telephone Encounter (Signed)
° °  Mary with [facility] call to ask for verbal PT orders for strengthing    2 times a week for 3 weeks   [phone]

## 2016-08-29 NOTE — Progress Notes (Signed)
08-29-16 CBC with Diff and PT results routed to Dr. Laurence Ferrari for review

## 2016-08-30 NOTE — Telephone Encounter (Signed)
I spoke Stanton Kidney and gave verbal okay for PT orders.

## 2016-08-30 NOTE — Telephone Encounter (Signed)
please call this in

## 2016-08-30 NOTE — Progress Notes (Signed)
Called patient and spoke with husband instructing him that patient needs to report to Radiology at [facility] on Friday 09/01/2016 at 0630 am  And to follow all other instructions given to her at Pre-op appointment.Marland Kitchen Spouse verbalized understanding.

## 2016-08-31 ENCOUNTER — Telehealth: Payer: Self-pay | Admitting: *Deleted

## 2016-08-31 ENCOUNTER — Other Ambulatory Visit: Payer: Self-pay | Admitting: General Surgery

## 2016-08-31 ENCOUNTER — Other Ambulatory Visit: Payer: Self-pay | Admitting: Radiology

## 2016-08-31 DIAGNOSIS — F0390 Unspecified dementia without behavioral disturbance: Secondary | ICD-10-CM | POA: Diagnosis not present

## 2016-08-31 DIAGNOSIS — I10 Essential (primary) hypertension: Secondary | ICD-10-CM | POA: Diagnosis not present

## 2016-08-31 DIAGNOSIS — C220 Liver cell carcinoma: Secondary | ICD-10-CM | POA: Diagnosis not present

## 2016-08-31 DIAGNOSIS — K589 Irritable bowel syndrome without diarrhea: Secondary | ICD-10-CM | POA: Diagnosis not present

## 2016-08-31 DIAGNOSIS — K7460 Unspecified cirrhosis of liver: Secondary | ICD-10-CM | POA: Diagnosis not present

## 2016-08-31 DIAGNOSIS — E119 Type 2 diabetes mellitus without complications: Secondary | ICD-10-CM | POA: Diagnosis not present

## 2016-08-31 NOTE — Telephone Encounter (Signed)
[name] [name] with [facility] called in, patient had all ordered labs done at preop appt on 08/28/16 except Mg level, home health nurse was unable to obtain venipuncture to get Mg level drawn. Do you want home health to re-attempt next week?

## 2016-08-31 NOTE — Telephone Encounter (Signed)
No need to redraw, please cancel the order for magnesium

## 2016-09-01 ENCOUNTER — Observation Stay (HOSPITAL_COMMUNITY)
Admission: RE | Admit: 2016-09-01 | Discharge: 2016-09-02 | Disposition: A | Discharge status: Home / Self Care | Payer: Medicare Other | Source: Ambulatory Visit | Attending: Interventional Radiology | Admitting: Interventional Radiology

## 2016-09-01 ENCOUNTER — Encounter (HOSPITAL_COMMUNITY): Payer: Self-pay | Admitting: Certified Registered Nurse Anesthetist

## 2016-09-01 ENCOUNTER — Ambulatory Visit (HOSPITAL_COMMUNITY): Payer: Medicare Other | Admitting: Certified Registered Nurse Anesthetist

## 2016-09-01 ENCOUNTER — Encounter (HOSPITAL_COMMUNITY)
Admission: RE | Disposition: A | Discharge status: Home / Self Care | Payer: Self-pay | Source: Ambulatory Visit | Attending: Interventional Radiology

## 2016-09-01 ENCOUNTER — Encounter (HOSPITAL_COMMUNITY): Payer: Self-pay

## 2016-09-01 ENCOUNTER — Ambulatory Visit (HOSPITAL_COMMUNITY)
Admission: RE | Admit: 2016-09-01 | Discharge: 2016-09-01 | Disposition: A | Discharge status: Home / Self Care | Payer: Medicare Other | Source: Ambulatory Visit | Attending: Internal Medicine | Admitting: Internal Medicine

## 2016-09-01 DIAGNOSIS — C220 Liver cell carcinoma: Secondary | ICD-10-CM | POA: Diagnosis not present

## 2016-09-01 DIAGNOSIS — I10 Essential (primary) hypertension: Secondary | ICD-10-CM | POA: Diagnosis not present

## 2016-09-01 DIAGNOSIS — E785 Hyperlipidemia, unspecified: Secondary | ICD-10-CM | POA: Diagnosis not present

## 2016-09-01 DIAGNOSIS — K589 Irritable bowel syndrome without diarrhea: Secondary | ICD-10-CM | POA: Insufficient documentation

## 2016-09-01 DIAGNOSIS — Z853 Personal history of malignant neoplasm of breast: Secondary | ICD-10-CM | POA: Diagnosis not present

## 2016-09-01 DIAGNOSIS — I5032 Chronic diastolic (congestive) heart failure: Secondary | ICD-10-CM | POA: Diagnosis not present

## 2016-09-01 DIAGNOSIS — Z85038 Personal history of other malignant neoplasm of large intestine: Secondary | ICD-10-CM | POA: Insufficient documentation

## 2016-09-01 DIAGNOSIS — Z9011 Acquired absence of right breast and nipple: Secondary | ICD-10-CM | POA: Diagnosis not present

## 2016-09-01 DIAGNOSIS — Z923 Personal history of irradiation: Secondary | ICD-10-CM | POA: Insufficient documentation

## 2016-09-01 DIAGNOSIS — K7460 Unspecified cirrhosis of liver: Secondary | ICD-10-CM | POA: Diagnosis not present

## 2016-09-01 DIAGNOSIS — Z79899 Other long term (current) drug therapy: Secondary | ICD-10-CM | POA: Insufficient documentation

## 2016-09-01 DIAGNOSIS — K219 Gastro-esophageal reflux disease without esophagitis: Secondary | ICD-10-CM | POA: Diagnosis not present

## 2016-09-01 DIAGNOSIS — Z87891 Personal history of nicotine dependence: Secondary | ICD-10-CM | POA: Diagnosis not present

## 2016-09-01 DIAGNOSIS — G4730 Sleep apnea, unspecified: Secondary | ICD-10-CM | POA: Diagnosis not present

## 2016-09-01 DIAGNOSIS — Z794 Long term (current) use of insulin: Secondary | ICD-10-CM | POA: Insufficient documentation

## 2016-09-01 DIAGNOSIS — E119 Type 2 diabetes mellitus without complications: Secondary | ICD-10-CM | POA: Diagnosis not present

## 2016-09-01 DIAGNOSIS — K7581 Nonalcoholic steatohepatitis (NASH): Secondary | ICD-10-CM | POA: Insufficient documentation

## 2016-09-01 DIAGNOSIS — Z7982 Long term (current) use of aspirin: Secondary | ICD-10-CM | POA: Insufficient documentation

## 2016-09-01 DIAGNOSIS — K769 Liver disease, unspecified: Secondary | ICD-10-CM | POA: Diagnosis not present

## 2016-09-01 DIAGNOSIS — C228 Malignant neoplasm of liver, primary, unspecified as to type: Secondary | ICD-10-CM | POA: Diagnosis not present

## 2016-09-01 DIAGNOSIS — G4733 Obstructive sleep apnea (adult) (pediatric): Secondary | ICD-10-CM | POA: Diagnosis not present

## 2016-09-01 DIAGNOSIS — Z9221 Personal history of antineoplastic chemotherapy: Secondary | ICD-10-CM | POA: Insufficient documentation

## 2016-09-01 DIAGNOSIS — F0390 Unspecified dementia without behavioral disturbance: Secondary | ICD-10-CM | POA: Insufficient documentation

## 2016-09-01 HISTORY — PX: RADIOFREQUENCY ABLATION: SHX2290

## 2016-09-01 LAB — GLUCOSE, CAPILLARY
GLUCOSE-CAPILLARY: 102 mg/dL — AB (ref 65–99)
GLUCOSE-CAPILLARY: 108 mg/dL — AB (ref 65–99)
Glucose-Capillary: 115 mg/dL — ABNORMAL HIGH (ref 65–99)
Glucose-Capillary: 124 mg/dL — ABNORMAL HIGH (ref 65–99)

## 2016-09-01 LAB — TYPE AND SCREEN
ABO/RH(D): O NEG
Antibody Screen: NEGATIVE

## 2016-09-01 SURGERY — RADIO FREQUENCY ABLATION
Anesthesia: General

## 2016-09-01 MED ORDER — ROCURONIUM BROMIDE 10 MG/ML (PF) SYRINGE
PREFILLED_SYRINGE | INTRAVENOUS | Status: DC | PRN
  Administered 2016-09-01: 50 mg via INTRAVENOUS
  Administered 2016-09-01 (×2): 20 mg via INTRAVENOUS

## 2016-09-01 MED ORDER — LACTATED RINGERS IV SOLN
INTRAVENOUS | Status: DC
  Administered 2016-09-01: 1000 mL via INTRAVENOUS
  Administered 2016-09-01: 09:00:00 via INTRAVENOUS

## 2016-09-01 MED ORDER — FENTANYL CITRATE (PF) 100 MCG/2ML IJ SOLN
INTRAMUSCULAR | Status: AC
  Filled 2016-09-01: qty 2

## 2016-09-01 MED ORDER — ROSUVASTATIN CALCIUM 5 MG PO TABS
5.0000 mg | ORAL_TABLET | Freq: Every day | ORAL | Status: DC
  Administered 2016-09-02: 5 mg via ORAL
  Filled 2016-09-01: qty 1

## 2016-09-01 MED ORDER — ONDANSETRON HCL 4 MG/2ML IJ SOLN
4.0000 mg | Freq: Four times a day (QID) | INTRAMUSCULAR | Status: DC | PRN
  Administered 2016-09-01: 4 mg via INTRAVENOUS
  Filled 2016-09-01: qty 2

## 2016-09-01 MED ORDER — HYDROMORPHONE HCL 1 MG/ML IJ SOLN
INTRAMUSCULAR | Status: AC
  Administered 2016-09-01: 0.25 mg via INTRAVENOUS
  Filled 2016-09-01: qty 1

## 2016-09-01 MED ORDER — ASPIRIN EC 81 MG PO TBEC
81.0000 mg | DELAYED_RELEASE_TABLET | Freq: Every day | ORAL | Status: DC
  Administered 2016-09-02: 81 mg via ORAL
  Filled 2016-09-01: qty 1

## 2016-09-01 MED ORDER — HYDROMORPHONE HCL 1 MG/ML IJ SOLN
0.5000 mg | INTRAMUSCULAR | Status: DC | PRN
  Administered 2016-09-01: 0.5 mg via INTRAVENOUS
  Filled 2016-09-01: qty 1

## 2016-09-01 MED ORDER — FUROSEMIDE 20 MG PO TABS
20.0000 mg | ORAL_TABLET | Freq: Every day | ORAL | Status: DC
  Filled 2016-09-01: qty 1

## 2016-09-01 MED ORDER — DIPHENHYDRAMINE HCL 25 MG PO CAPS: 25.0000 mg | ORAL_CAPSULE | Freq: Four times a day (QID) | ORAL | Status: DC | PRN

## 2016-09-01 MED ORDER — SENNOSIDES-DOCUSATE SODIUM 8.6-50 MG PO TABS: 1.0000 | ORAL_TABLET | Freq: Every evening | ORAL | Status: DC | PRN

## 2016-09-01 MED ORDER — MEPERIDINE HCL 50 MG/ML IJ SOLN: 6.2500 mg | INTRAMUSCULAR | Status: DC | PRN

## 2016-09-01 MED ORDER — SUGAMMADEX SODIUM 200 MG/2ML IV SOLN
INTRAVENOUS | Status: DC | PRN
  Administered 2016-09-01: 300 mg via INTRAVENOUS

## 2016-09-01 MED ORDER — EPHEDRINE 5 MG/ML INJ
INTRAVENOUS | Status: AC
  Filled 2016-09-01: qty 10

## 2016-09-01 MED ORDER — DIPHENOXYLATE-ATROPINE 2.5-0.025 MG PO TABS: 2.0000 | ORAL_TABLET | Freq: Four times a day (QID) | ORAL | Status: DC | PRN

## 2016-09-01 MED ORDER — HYDROMORPHONE HCL 1 MG/ML IJ SOLN
0.2500 mg | INTRAMUSCULAR | Status: DC | PRN
  Administered 2016-09-01 (×2): 0.25 mg via INTRAVENOUS

## 2016-09-01 MED ORDER — INSULIN NPH (HUMAN) (ISOPHANE) 100 UNIT/ML ~~LOC~~ SUSP
20.0000 [IU] | Freq: Two times a day (BID) | SUBCUTANEOUS | Status: DC
  Administered 2016-09-01 – 2016-09-02 (×2): 20 [IU] via SUBCUTANEOUS
  Filled 2016-09-01: qty 10

## 2016-09-01 MED ORDER — METOPROLOL TARTRATE 25 MG PO TABS
25.0000 mg | ORAL_TABLET | Freq: Two times a day (BID) | ORAL | Status: DC
  Administered 2016-09-01 – 2016-09-02 (×2): 25 mg via ORAL
  Filled 2016-09-01 (×2): qty 1

## 2016-09-01 MED ORDER — LORATADINE 10 MG PO TABS
10.0000 mg | ORAL_TABLET | Freq: Every day | ORAL | Status: DC
  Administered 2016-09-02: 10 mg via ORAL
  Filled 2016-09-01: qty 1

## 2016-09-01 MED ORDER — CEFAZOLIN SODIUM-DEXTROSE 2-4 GM/100ML-% IV SOLN
2.0000 g | INTRAVENOUS | Status: AC
  Administered 2016-09-01: 2 g via INTRAVENOUS
  Filled 2016-09-01: qty 100

## 2016-09-01 MED ORDER — PANTOPRAZOLE SODIUM 40 MG PO TBEC
40.0000 mg | DELAYED_RELEASE_TABLET | Freq: Every day | ORAL | Status: DC
  Administered 2016-09-02: 40 mg via ORAL
  Filled 2016-09-01: qty 1

## 2016-09-01 MED ORDER — HYDROCODONE-ACETAMINOPHEN 5-325 MG PO TABS
1.0000 | ORAL_TABLET | ORAL | Status: DC | PRN
  Administered 2016-09-01: 1 via ORAL
  Filled 2016-09-01: qty 1

## 2016-09-01 MED ORDER — PROPOFOL 10 MG/ML IV BOLUS
INTRAVENOUS | Status: AC
  Filled 2016-09-01: qty 40

## 2016-09-01 MED ORDER — ONDANSETRON HCL 4 MG/2ML IJ SOLN
INTRAMUSCULAR | Status: DC | PRN
  Administered 2016-09-01: 4 mg via INTRAVENOUS

## 2016-09-01 MED ORDER — PHENYLEPHRINE 40 MCG/ML (10ML) SYRINGE FOR IV PUSH (FOR BLOOD PRESSURE SUPPORT)
PREFILLED_SYRINGE | INTRAVENOUS | Status: DC | PRN
  Administered 2016-09-01 (×3): 80 ug via INTRAVENOUS

## 2016-09-01 MED ORDER — PIPERACILLIN-TAZOBACTAM 3.375 G IVPB
3.3750 g | Freq: Three times a day (TID) | INTRAVENOUS | Status: DC
  Administered 2016-09-01 – 2016-09-02 (×3): 3.375 g via INTRAVENOUS
  Filled 2016-09-01 (×5): qty 50

## 2016-09-01 MED ORDER — ONDANSETRON HCL 4 MG/2ML IJ SOLN: 4.0000 mg | Freq: Once | INTRAMUSCULAR | Status: DC | PRN

## 2016-09-01 MED ORDER — DOCUSATE SODIUM 100 MG PO CAPS
100.0000 mg | ORAL_CAPSULE | Freq: Two times a day (BID) | ORAL | Status: DC
  Administered 2016-09-01: 100 mg via ORAL
  Filled 2016-09-01 (×2): qty 1

## 2016-09-01 MED ORDER — FENTANYL CITRATE (PF) 100 MCG/2ML IJ SOLN
INTRAMUSCULAR | Status: DC | PRN
  Administered 2016-09-01 (×2): 50 ug via INTRAVENOUS

## 2016-09-01 MED ORDER — PROPOFOL 10 MG/ML IV BOLUS
INTRAVENOUS | Status: DC | PRN
Start: 2016-09-01 — End: 2016-09-01
  Administered 2016-09-01: 130 mg via INTRAVENOUS

## 2016-09-01 MED ORDER — MECLIZINE HCL 25 MG PO TABS: 25.0000 mg | ORAL_TABLET | ORAL | Status: DC | PRN

## 2016-09-01 MED ORDER — IOPAMIDOL (ISOVUE-300) INJECTION 61%
INTRAVENOUS | Status: AC
  Filled 2016-09-01: qty 75

## 2016-09-01 MED ORDER — IOPAMIDOL (ISOVUE-300) INJECTION 61%
75.0000 mL | Freq: Once | INTRAVENOUS | Status: AC | PRN
Start: 2016-09-01 — End: 2016-09-01
  Administered 2016-09-01: 75 mL via INTRAVENOUS

## 2016-09-01 NOTE — Anesthesia Preprocedure Evaluation (Signed)
Anesthesia Evaluation  Patient identified by MRN, date of birth, ID band Patient awake    Reviewed: Allergy & Precautions, NPO status , Patient's Chart, lab work & pertinent test results  Airway Mallampati: I  TM Distance: >3 FB Neck ROM: Full    Dental   Pulmonary sleep apnea , former smoker,    Pulmonary exam normal        Cardiovascular hypertension, Pt. on medications Normal cardiovascular exam     Neuro/Psych PSYCHIATRIC DISORDERS    GI/Hepatic GERD  Medicated and Controlled,  Endo/Other  diabetes  Renal/GU      Musculoskeletal   Abdominal   Peds  Hematology   Anesthesia Other Findings   Reproductive/Obstetrics                             Anesthesia Physical Anesthesia Plan  ASA: III  Anesthesia Plan: General   Post-op Pain Management:    Induction: Intravenous  Airway Management Planned: Oral ETT  Additional Equipment:   Intra-op Plan:   Post-operative Plan: Extubation in OR  Informed Consent: I have reviewed the patients History and Physical, chart, labs and discussed the procedure including the risks, benefits and alternatives for the proposed anesthesia with the patient or authorized representative who has indicated his/her understanding and acceptance.     Plan Discussed with: CRNA and Surgeon  Anesthesia Plan Comments:         Anesthesia Quick Evaluation

## 2016-09-01 NOTE — Procedures (Signed)
Interventional Radiology Procedure Note  Procedure:  Percutaneous thermal ablation of segment 6 HCC.  Complications: None.  Estimated Blood Loss: None  Recommendations: - Admit for obs - ADAT - Labs in am - DC home  Signed,  Criselda Peaches, MD

## 2016-09-01 NOTE — Anesthesia Postprocedure Evaluation (Signed)
Anesthesia Post Note  Patient: TORRA PALA  Procedure(s) Performed: Procedure(s) (LRB): MICROWAVE THERMAL ABLATION (N/A)  Patient location during evaluation: PACU Anesthesia Type: General Level of consciousness: awake and alert Pain management: pain level controlled Vital Signs Assessment: post-procedure vital signs reviewed and stable Respiratory status: spontaneous breathing, nonlabored ventilation, respiratory function stable and patient connected to nasal cannula oxygen Cardiovascular status: blood pressure returned to baseline and stable Postop Assessment: no signs of nausea or vomiting Anesthetic complications: no       Last Vitals:  Vitals:   09/01/16 1215 09/01/16 1230  BP: (!) 145/68 (!) 148/71  Pulse: 75 75  Resp: 13 12  Temp:  36.4 C    Last Pain:  Vitals:   09/01/16 1230  TempSrc:   PainSc: 1                  Panhia Karl DAVID

## 2016-09-01 NOTE — Transfer of Care (Signed)
Immediate Anesthesia Transfer of Care Note  Patient: [name] [name]  Procedure(s) Performed: Procedure(s): MICROWAVE THERMAL ABLATION (N/A)  Patient Location: PACU  Anesthesia Type:MAC  Level of Consciousness: sedated, patient cooperative and responds to stimulation  Airway & Oxygen Therapy: Patient Spontanous Breathing and Patient connected to face mask oxygen  Post-op Assessment: Report given to RN and Post -op Vital signs reviewed and stable  Post vital signs: Reviewed and stable  Last Vitals:  Vitals:   09/01/16 0642  BP: 134/65  Pulse: 70  Resp: 20    Last Pain:  Vitals:   09/01/16 0652  TempSrc:   PainSc: 0-No pain         Complications: No apparent anesthesia complications

## 2016-09-01 NOTE — Progress Notes (Signed)
Patient ID: [name] [name], female   DOB: Feb 22, 1941, 76 y.o.   MRN: [id number] Pt a little drowsy but responding to questions; husband in room; had 1 episode N/V earlier, now resolved; had HA earlier, resolved; mild soreness at RUQ abd puncture site; has had small amt [place] VSS;AF Abd soft, obese; few BS, puncture site RUQ clean and dry, mildly tender, no hematoma A/P: s/p thermal ablation seg 6 HCC earlier today; for overnight obs; d/c Foley later today; check am labs, for f/u clinic visit/abd MRI in 4-6 weeks   [name]

## 2016-09-01 NOTE — H&P (Signed)
Referring Physician(s): Sher[place],B  Supervising Physician: [name]  Patient Status:  [facility] OP TBA  Chief Complaint:  Hepatocellular carcinoma  Subjective: Patient familiar to IR service from prior liver lesion biopsy in 2017 as well as DEB TACE of a segment 6 hepatocellular carcinoma in March of this year.She has a history of NASH cirrhosis as well. She presents today for CT-guided thermal ablation of the segment 6 HCC and to further evaluate/possibly treat additional hepatic lesions in segments 2 and 8. She currently denies fever, headache, chest pain, dyspnea, cough, abd/back pain, nausea/ vomiting or abnormal bleeding. Past Medical History:  Diagnosis Date  . Adenocarcinoma, breast [facility])    sees Dr. Marcy Panning   . Allergy    environmental  . Arthritis    sees Dr. Hurley Cisco   . Barrett esophagus 12/20/11  . Bladder cancer ([place])   . Breast cancer ([place])   . Cancer, hepatocellular ([place])   . Dementia    mild  . Diabetes mellitus    sees Dr. Chalmers Cater   . Diverticulosis of colon (without mention of hemorrhage) 09/26/2004,09/10/2006  . Gastritis   . GERD (gastroesophageal reflux disease)   . Hemorrhoids   . Hepatic cirrhosis ([place])   . Hepatic steatosis   . Hiatal hernia   . Hyperglycemia   . Hyperlipidemia   . Hypertension   . IBS (irritable bowel syndrome)   . Inflammatory polyps of colon ([place])   . Iron deficiency anemia   . Malignant neoplasm of colon, unspecified site 07/08/2003  . Personal history of chemotherapy   . Personal history of radiation therapy   . Sleep apnea    sees Dr. Gwenette Greet   . Transaminase or LDH elevation    sees Dr. Zenovia Jarred    Past Surgical History:  Procedure Laterality Date  . bladder cancer 2001    . BREAST BIOPSY    . BREAST SURGERY  2001   lymph nodes removed and rt mastectomy  . CARPAL TUNNEL RELEASE    . [place]   for diverticulitis  . COLONOSCOPY    . ESOPHAGEAL DILATION    . incisonal hernia    .  IR GENERIC HISTORICAL  04/25/2016   IR RADIOLOGIST EVAL & MGMT 04/25/2016 [name], MD [facility] INTERV RAD  . IR GENERIC HISTORICAL  08/01/2016   IR 3D INDEPENDENT [facility]KST 08/01/2016 [facility]  . IR GENERIC HISTORICAL  08/01/2016   IR ANGIOGRAM VISCERAL SELECTIVE 08/01/2016 [facility]  . IR GENERIC HISTORICAL  08/01/2016   IR EMBO TUMOR ORGAN ISCHEMIA INFARCT INC GUIDE ROADMAPPING 08/01/2016 [facility]  . IR GENERIC HISTORICAL  08/01/2016   IR US GUIDE VASC ACCESS RIGHT 08/01/2016 [facility]  . IR GENERIC HISTORICAL  08/01/2016   IR ANGIOGRAM SELECTIVE EACH ADDITIONAL VESSEL 08/01/2016 [facility]  . IR GENERIC HISTORICAL  08/01/2016   IR ANGIOGRAM SELECTIVE EACH ADDITIONAL VESSEL 08/01/2016 [facility]  . MASTECTOMY    . mastectomy and biopsy    . sigmoid colectomy 2005     for cancer  . squamous cell CA  [facility]  [place]  2008        Allergies: Patient has no known allergies.  Medications: Prior to Admission medications   Medication Sig Start Date End Date Taking? Authorizing Provider  aspirin EC 81 MG tablet Take 81 mg by mouth daily.    Yes Historical Provider, MD  Calcium Carbonate-Vitamin D (CALCIUM 600+D) 600-400 MG-UNIT tablet Take 1 tablet by mouth daily.  Yes Historical Provider, MD  diphenhydrAMINE (BENADRYL) 25 MG tablet Take 25 mg by mouth at bedtime.   Yes Historical Provider, MD  diphenoxylate-atropine (LOMOTIL) 2.5-0.025 MG tablet Take 2 tablets by mouth 4 (four) times daily as needed for diarrhea or loose stools. 06/15/16  Yes Jerene Bears, MD  furosemide (LASIX) 40 MG tablet Take 0.5 tablets (20 mg total) by mouth daily. 08/22/16  Yes [place], DO  insulin NPH Human (HUMULIN N,NOVOLIN N) 100 UNIT/ML injection Inject 20 Units into the skin 2 (two) times daily with a meal.    Yes Historical Provider, MD  loratadine (CLARITIN) 10 MG tablet Take 10 mg by mouth daily.   Yes Historical Provider, MD  metoCLOPramide (REGLAN) 5 MG tablet Take 5 mg by mouth 2 (two)  times daily with a meal.  08/18/16  Yes Historical Provider, MD  metoprolol tartrate (LOPRESSOR) 25 MG tablet TAKE 1 TABLET TWICE DAILY 04/24/16  Yes Lorretta Harp, MD  Omega-3 Fatty Acids (FISH OIL PO) Take 1 capsule by mouth every evening.    Yes Historical Provider, MD  omeprazole (PRILOSEC) 20 MG capsule TAKE 1 CAPSULE TWICE DAILY BEFORE MEALS 06/22/16  Yes Jerene Bears, MD  ondansetron (ZOFRAN-ODT) 4 MG disintegrating tablet DISSOLVE ONE TABLET IN MOUTH EVERY 8 HOURS AS NEEDED FOR NAUSEA OR VOMITING Patient taking differently: Take 4 mg by mouth every 8 (eight) hours as needed for nausea or vomiting.  06/15/16  Yes Jerene Bears, MD  potassium chloride SA (K-DUR,KLOR-CON) 20 MEQ tablet Take 1 tablet (20 mEq total) by mouth daily. OVERDUE FOR FOLLOW UP. PLEASE CALL AND SCHEDULE [phone] 08/07/16  Yes Lorretta Harp, MD  rosuvastatin (CRESTOR) 10 MG tablet TAKE 1/2 TABLET EVERY DAY Patient taking differently: TAKE 5 MG EVERY EVENING 10/15/15  Yes [name], MD  traMADol (ULTRAM) 50 MG tablet Take 1 tablet (50 mg total) by mouth every 6 (six) hours as needed. Patient taking differently: Take 50 mg by mouth every 6 (six) hours as needed for moderate pain.  03/11/15  Yes [name], MD  meclizine (ANTIVERT) 25 MG tablet Take 1 tablet (25 mg total) by mouth every 4 (four) hours as needed for dizziness. 05/13/14   [name], MD     Vital Signs: BP 134/65   Pulse 70   Resp 20   Ht 5\' 5"  (1.651 m)   Wt 166 lb 2 oz (75.4 kg)   SpO2 97%   BMI 27.64 kg/m   Physical Exam Awake/alert. Chest with clear but distant breath sounds bilaterally. Heart with regular rate and rhythm. Abdomen soft, slightly protuberant, positive bowel sounds, nontender. No lower extremity edema.  Imaging: No results found.  Labs:  CBC:  Recent Labs  08/20/16 0312 08/21/16 0552 08/22/16 0540 08/28/16 1453  WBC 6.4 5.0 5.5 4.7  HGB 8.9* 8.0* 8.4* 8.4*  HCT 28.3* 25.1* 26.3* 27.6*  PLT 198 148* 177 181      COAGS:  Recent Labs  04/11/16 1457 05/04/16 1129  07/04/16 1830  08/01/16 1154 08/17/16 1606 08/19/16 2039 08/28/16 1453  INR 1.29 1.23  < > 1.22  < > 1.18 1.3* 1.42 1.28  APTT 39* 43*  --  36  --   --   --  36  --   < > = values in this interval not displayed.  BMP:  Recent Labs  08/20/16 0312 08/21/16 0552 08/22/16 0540 08/28/16 1453  NA 138 137 138 139  K 3.5  3.4* 5.0 4.2  CL 105 109 113* 107  CO2 23 21* 19* 24  GLUCOSE 117* 101* 91 140*  BUN 20 12 9 11   CALCIUM 7.8* 7.6* 7.6* 9.8  CREATININE 1.23* 1.04* 0.87 0.98  GFRNONAA 42* 51* >60 55*  GFRAA 48* 59* >60 >60    LIVER FUNCTION TESTS:  Recent Labs  08/19/16 1349 08/20/16 0312 08/22/16 0540 08/28/16 1453  BILITOT 1.0 0.9 1.1 0.8  AST 32 30 44* 38  ALT 14 13* 19 18  ALKPHOS 108 91 109 133*  PROT 7.7 6.8 5.9* 6.7  ALBUMIN 3.0* 2.7* 2.3* 2.7*    Assessment and Plan: Pt with history of NASH cirrhosis and segment 6 HCC, status post DEB- TACE in March 2018; she presents  now for CT-guided thermal ablation of the segment 6 HCC as well as further evaluation/possible treatment of additional lesions in hepatic segments 2 and 8. Details/risks of procedure, including but not limited to, internal bleeding, infection, anesthesia-related complications, injury to adjacent structures,  discussed with patient and family with their understanding and consent. Post procedure she will be admitted for overnight observation.   Electronically Signed: D. Rowe Robert 09/01/2016, 8:26 AM   I spent a total of 30 minutes at the the patient's bedside AND on the patient's hospital floor or unit, greater than 50% of which was counseling/coordinating care for CT-guided thermal ablation of hepatocellular carcinoma

## 2016-09-01 NOTE — Anesthesia Procedure Notes (Signed)
Procedure Name: Intubation Performed by: [name] [name] Pre-anesthesia Checklist: Patient identified, Emergency Drugs available, Suction available, Patient being monitored and Timeout performed Patient Re-evaluated:Patient Re-evaluated prior to inductionOxygen Delivery Method: Circle system utilized Preoxygenation: Pre-oxygenation with 100% oxygen Intubation Type: IV induction Ventilation: Mask ventilation without difficulty Laryngoscope Size: Mac and 3 Grade View: Grade II Tube type: Oral Tube size: 7.0 mm Number of attempts: 2 Airway Equipment and Method: Stylet Placement Confirmation: ETT inserted through vocal cords under direct vision,  positive ETCO2,  CO2 detector and breath sounds checked- equal and bilateral Secured at: 21 cm Tube secured with: Tape Dental Injury: Teeth and Oropharynx as per pre-operative assessment

## 2016-09-01 NOTE — Progress Notes (Signed)
Pt has declined use of CPAP tonight.  Pt to notify RT if she changes her mind, RT to monitor and assess as needed.

## 2016-09-02 DIAGNOSIS — K219 Gastro-esophageal reflux disease without esophagitis: Secondary | ICD-10-CM | POA: Diagnosis not present

## 2016-09-02 DIAGNOSIS — C220 Liver cell carcinoma: Secondary | ICD-10-CM | POA: Diagnosis not present

## 2016-09-02 DIAGNOSIS — G4730 Sleep apnea, unspecified: Secondary | ICD-10-CM | POA: Diagnosis not present

## 2016-09-02 DIAGNOSIS — C228 Malignant neoplasm of liver, primary, unspecified as to type: Secondary | ICD-10-CM | POA: Diagnosis not present

## 2016-09-02 DIAGNOSIS — E119 Type 2 diabetes mellitus without complications: Secondary | ICD-10-CM | POA: Diagnosis not present

## 2016-09-02 DIAGNOSIS — Z87891 Personal history of nicotine dependence: Secondary | ICD-10-CM | POA: Diagnosis not present

## 2016-09-02 DIAGNOSIS — I10 Essential (primary) hypertension: Secondary | ICD-10-CM | POA: Diagnosis not present

## 2016-09-02 LAB — CBC
HEMATOCRIT: 24.1 % — AB (ref 36.0–46.0)
Hemoglobin: 7.5 g/dL — ABNORMAL LOW (ref 12.0–15.0)
MCH: 28.1 pg (ref 26.0–34.0)
MCHC: 31.1 g/dL (ref 30.0–36.0)
MCV: 90.3 fL (ref 78.0–100.0)
PLATELETS: 133 10*3/uL — AB (ref 150–400)
RBC: 2.67 MIL/uL — ABNORMAL LOW (ref 3.87–5.11)
RDW: 18.4 % — AB (ref 11.5–15.5)
WBC: 4.1 10*3/uL (ref 4.0–10.5)

## 2016-09-02 LAB — COMPREHENSIVE METABOLIC PANEL
ALT: 27 U/L (ref 14–54)
ANION GAP: 7 (ref 5–15)
AST: 113 U/L — ABNORMAL HIGH (ref 15–41)
Albumin: 2.4 g/dL — ABNORMAL LOW (ref 3.5–5.0)
Alkaline Phosphatase: 90 U/L (ref 38–126)
BUN: 12 mg/dL (ref 6–20)
CHLORIDE: 109 mmol/L (ref 101–111)
CO2: 24 mmol/L (ref 22–32)
Calcium: 9.2 mg/dL (ref 8.9–10.3)
Creatinine, Ser: 1.16 mg/dL — ABNORMAL HIGH (ref 0.44–1.00)
GFR, EST AFRICAN AMERICAN: 52 mL/min — AB (ref >60)
GFR, EST NON AFRICAN AMERICAN: 45 mL/min — AB (ref >60)
Glucose, Bld: 119 mg/dL — ABNORMAL HIGH (ref 65–99)
POTASSIUM: 3.6 mmol/L (ref 3.5–5.1)
SODIUM: 140 mmol/L (ref 135–145)
Total Bilirubin: 1.1 mg/dL (ref 0.3–1.2)
Total Protein: 5.7 g/dL — ABNORMAL LOW (ref 6.5–8.1)

## 2016-09-02 NOTE — Discharge Summary (Signed)
Patient ID: [name] [name] MRN: [id number] DOB/AGE: [birth year] 76 y.o.  Admit date: 09/01/2016 Discharge date: 09/02/2016  Supervising Physician: Markus Daft  Patient Status: [facility] - In-pt  Admission Diagnoses: Hepatocellular carcinoma  Discharge Diagnoses:  Active Problems:   Hepatocellular carcinoma [facility])   Discharged Condition: good  Hospital Course:  Patient with history of NASH cirrhosis familiar to IR service from prior liver lesion biopsy in 2017 as well as DEB TACE of segment 6 [place] in March 2018.  She presented to [facility] yesterday for planned CT-guided thermal ablation of the segment 6 [place] with Dr. Laurence Ferrari.  Procedure was successful and without immediate complication.  Patient was admitted to observation overnight.  She has been able to advance diet with tolerance.  She denies pain, headache, nausea, or vomiting. She has been able to get OOB and ambulate to the bathroom. She is voiding well. Her family is present at bedside.  They are available to be with her at home today.  Discussed discharge instructions and plans for follow-up in the IR clinic in 4-6 weeks with Dr. Laurence Ferrari.  Patient is agreeable and is ready for discharge. Schedulers will contact patient with date and time of appointment.   Consults: None  Significant Diagnostic Studies: None  Treatments: CT-guided thermal ablation segment 6 [place]  Discharge Exam: Blood pressure (!) 115/55, pulse 73, temperature 98.4 F (36.9 C), temperature source Oral, resp. rate 16, height 5\' 5"  (1.651 m), weight 166 lb 2 oz (75.4 kg), SpO2 95 %. General appearance: alert, cooperative and no distress Resp: clear to auscultation bilaterally Cardio: regular rate and rhythm, S1, S2 normal, no murmur, click, rub or gallop GI: soft, non-tender; bowel sounds normal; no masses,  no organomegaly and Puncture site is intact without erythema or warmth.    Disposition: 06-Home-Health Care Svc  Patient to hear from schedulers with date and  time of follow-up appointment with Dr. Laurence Ferrari in 4-6 weeks.   Discharge Instructions    Call MD for:  difficulty breathing, headache or visual disturbances    Complete by:  As directed    Call MD for:  extreme fatigue    Complete by:  As directed    Call MD for:  hives    Complete by:  As directed    Call MD for:  persistant dizziness or light-headedness    Complete by:  As directed    Call MD for:  persistant nausea and vomiting    Complete by:  As directed    Call MD for:  redness, tenderness, or signs of infection (pain, swelling, redness, odor or green/yellow discharge around incision site)    Complete by:  As directed    Call MD for:  severe uncontrolled pain    Complete by:  As directed    Call MD for:  temperature >100.4    Complete by:  As directed    Diet - low sodium heart healthy    Complete by:  As directed    Discharge instructions    Complete by:  As directed    Follow-up with Dr. Laurence Ferrari in 4-6 weeks.  Will hear from schedulers with date and time of appointment.   Increase activity slowly    Complete by:  As directed    Lifting restrictions    Complete by:  As directed    No lifting over 10 lbs for 2-3 days.     Allergies as of 09/02/2016   No Known Allergies     Medication List  TAKE these medications   aspirin EC 81 MG tablet Take 81 mg by mouth daily.   CALCIUM 600+D 600-400 MG-UNIT tablet Generic drug:  Calcium Carbonate-Vitamin D Take 1 tablet by mouth daily.   diphenhydrAMINE 25 MG tablet Commonly known as:  BENADRYL Take 25 mg by mouth at bedtime.   diphenoxylate-atropine 2.5-0.025 MG tablet Commonly known as:  LOMOTIL Take 2 tablets by mouth 4 (four) times daily as needed for diarrhea or loose stools.   FISH OIL PO Take 1 capsule by mouth every evening.   furosemide 40 MG tablet Commonly known as:  LASIX Take 0.5 tablets (20 mg total) by mouth daily.   insulin NPH Human 100 UNIT/ML injection Commonly known as:  HUMULIN  N,NOVOLIN N Inject 20 Units into the skin 2 (two) times daily with a meal.   loratadine 10 MG tablet Commonly known as:  CLARITIN Take 10 mg by mouth daily.   meclizine 25 MG tablet Commonly known as:  ANTIVERT Take 1 tablet (25 mg total) by mouth every 4 (four) hours as needed for dizziness.   metoCLOPramide 5 MG tablet Commonly known as:  REGLAN Take 5 mg by mouth 2 (two) times daily with a meal.   metoprolol tartrate 25 MG tablet Commonly known as:  LOPRESSOR TAKE 1 TABLET TWICE DAILY   omeprazole 20 MG capsule Commonly known as:  PRILOSEC TAKE 1 CAPSULE TWICE DAILY BEFORE MEALS   ondansetron 4 MG disintegrating tablet Commonly known as:  ZOFRAN-ODT DISSOLVE ONE TABLET IN MOUTH EVERY 8 HOURS AS NEEDED FOR NAUSEA OR VOMITING What changed:  how much to take  how to take this  when to take this  reasons to take this  additional instructions   potassium chloride SA 20 MEQ tablet Commonly known as:  K-DUR,KLOR-CON Take 1 tablet (20 mEq total) by mouth daily. OVERDUE FOR FOLLOW UP. PLEASE CALL AND SCHEDULE [phone]   rosuvastatin 10 MG tablet Commonly known as:  CRESTOR TAKE 1/2 TABLET EVERY DAY What changed:  See the new instructions.   traMADol 50 MG tablet Commonly known as:  ULTRAM Take 1 tablet (50 mg total) by mouth every 6 (six) hours as needed. What changed:  reasons to take this      Follow-up Information    MCCULLOUGH, HEATH, MD Follow up in 4 week(s).   Specialties:  Interventional Radiology, Radiology Why:  Follow-up in IR clinic in 4-6 weeks.  Will hear from schedulers with date and time of appointment.  Contact information: [address] [place] [address] [phone]            Electronically Signed: Docia Barrier 09/02/2016, 11:33 AM   I have spent Greater Than 30 Minutes discharging GRESIA ISIDORO.

## 2016-09-03 ENCOUNTER — Telehealth: Payer: Self-pay | Admitting: Diagnostic Radiology

## 2016-09-03 NOTE — Telephone Encounter (Signed)
Patient's daughter called last night and said her mother had a fever up to 101 degree F.  Only other complaint was constipation but no pain.  Spoke with daughter again today and patient feels better but still has a mild fever of 99.7 F.  Reportedly, patient had a fever after her other liver procedure.  A hepatic infection is unlikely so early after the ablation procedure.  Family will let us know if fever continues or progresses.

## 2016-09-04 ENCOUNTER — Encounter (HOSPITAL_COMMUNITY): Payer: Self-pay | Admitting: Interventional Radiology

## 2016-09-04 DIAGNOSIS — E119 Type 2 diabetes mellitus without complications: Secondary | ICD-10-CM | POA: Diagnosis not present

## 2016-09-04 DIAGNOSIS — C220 Liver cell carcinoma: Secondary | ICD-10-CM | POA: Diagnosis not present

## 2016-09-04 DIAGNOSIS — K589 Irritable bowel syndrome without diarrhea: Secondary | ICD-10-CM | POA: Diagnosis not present

## 2016-09-04 DIAGNOSIS — K7460 Unspecified cirrhosis of liver: Secondary | ICD-10-CM | POA: Diagnosis not present

## 2016-09-04 DIAGNOSIS — I10 Essential (primary) hypertension: Secondary | ICD-10-CM | POA: Diagnosis not present

## 2016-09-04 DIAGNOSIS — F0390 Unspecified dementia without behavioral disturbance: Secondary | ICD-10-CM | POA: Diagnosis not present

## 2016-09-04 NOTE — Telephone Encounter (Signed)
Spoke to Feliz Beam, RN with [facility] advised of orders to cancel venipuncture for Mg level; understanding voiced.

## 2016-09-05 ENCOUNTER — Telehealth: Payer: Self-pay | Admitting: Family Medicine

## 2016-09-05 ENCOUNTER — Encounter: Payer: Self-pay | Admitting: Family Medicine

## 2016-09-05 DIAGNOSIS — C220 Liver cell carcinoma: Secondary | ICD-10-CM | POA: Diagnosis not present

## 2016-09-05 DIAGNOSIS — K589 Irritable bowel syndrome without diarrhea: Secondary | ICD-10-CM | POA: Diagnosis not present

## 2016-09-05 DIAGNOSIS — I10 Essential (primary) hypertension: Secondary | ICD-10-CM | POA: Diagnosis not present

## 2016-09-05 DIAGNOSIS — F0390 Unspecified dementia without behavioral disturbance: Secondary | ICD-10-CM | POA: Diagnosis not present

## 2016-09-05 DIAGNOSIS — K7460 Unspecified cirrhosis of liver: Secondary | ICD-10-CM | POA: Diagnosis not present

## 2016-09-05 DIAGNOSIS — E119 Type 2 diabetes mellitus without complications: Secondary | ICD-10-CM | POA: Diagnosis not present

## 2016-09-05 NOTE — Telephone Encounter (Signed)
° ° ° °  Pt saw Dr Sarajane Jews on 08/23/16 for a hospital  fup and Melissa from  [facility] would like to know if a plan of home health  care was discuss    [phone]

## 2016-09-05 NOTE — Telephone Encounter (Signed)
I received faxed orders for home health today and I signed them

## 2016-09-06 ENCOUNTER — Other Ambulatory Visit: Payer: Self-pay | Admitting: *Deleted

## 2016-09-06 DIAGNOSIS — K7460 Unspecified cirrhosis of liver: Secondary | ICD-10-CM | POA: Diagnosis not present

## 2016-09-06 DIAGNOSIS — I10 Essential (primary) hypertension: Secondary | ICD-10-CM | POA: Diagnosis not present

## 2016-09-06 DIAGNOSIS — F0390 Unspecified dementia without behavioral disturbance: Secondary | ICD-10-CM | POA: Diagnosis not present

## 2016-09-06 DIAGNOSIS — C220 Liver cell carcinoma: Secondary | ICD-10-CM | POA: Diagnosis not present

## 2016-09-06 DIAGNOSIS — K589 Irritable bowel syndrome without diarrhea: Secondary | ICD-10-CM | POA: Diagnosis not present

## 2016-09-06 DIAGNOSIS — E119 Type 2 diabetes mellitus without complications: Secondary | ICD-10-CM | POA: Diagnosis not present

## 2016-09-06 DIAGNOSIS — K769 Liver disease, unspecified: Secondary | ICD-10-CM

## 2016-09-06 NOTE — Telephone Encounter (Signed)
I left a voice message with below information for [place].

## 2016-09-11 DIAGNOSIS — K7460 Unspecified cirrhosis of liver: Secondary | ICD-10-CM | POA: Diagnosis not present

## 2016-09-11 DIAGNOSIS — C220 Liver cell carcinoma: Secondary | ICD-10-CM | POA: Diagnosis not present

## 2016-09-11 DIAGNOSIS — F0390 Unspecified dementia without behavioral disturbance: Secondary | ICD-10-CM | POA: Diagnosis not present

## 2016-09-11 DIAGNOSIS — E119 Type 2 diabetes mellitus without complications: Secondary | ICD-10-CM | POA: Diagnosis not present

## 2016-09-11 DIAGNOSIS — I10 Essential (primary) hypertension: Secondary | ICD-10-CM | POA: Diagnosis not present

## 2016-09-11 DIAGNOSIS — K589 Irritable bowel syndrome without diarrhea: Secondary | ICD-10-CM | POA: Diagnosis not present

## 2016-09-12 ENCOUNTER — Telehealth: Payer: Self-pay | Admitting: Family Medicine

## 2016-09-12 DIAGNOSIS — C220 Liver cell carcinoma: Secondary | ICD-10-CM | POA: Diagnosis not present

## 2016-09-12 DIAGNOSIS — E119 Type 2 diabetes mellitus without complications: Secondary | ICD-10-CM | POA: Diagnosis not present

## 2016-09-12 DIAGNOSIS — K589 Irritable bowel syndrome without diarrhea: Secondary | ICD-10-CM | POA: Diagnosis not present

## 2016-09-12 DIAGNOSIS — F0390 Unspecified dementia without behavioral disturbance: Secondary | ICD-10-CM | POA: Diagnosis not present

## 2016-09-12 DIAGNOSIS — I10 Essential (primary) hypertension: Secondary | ICD-10-CM | POA: Diagnosis not present

## 2016-09-12 DIAGNOSIS — K7460 Unspecified cirrhosis of liver: Secondary | ICD-10-CM | POA: Diagnosis not present

## 2016-09-12 NOTE — Telephone Encounter (Signed)
Tell her to use Miralax every day

## 2016-09-12 NOTE — Telephone Encounter (Signed)
I spoke with husband and gave advice for Miralax.

## 2016-09-12 NOTE — Telephone Encounter (Signed)
[name] [name] with [facility] states she is discharging pt today. Pt looks good, weight is stable, no edema  [place] to family home for 3 weeks. Small fine crackles at base of lung, but nothing to be concerned about.  However; Pt's stool is a little hard.  Would like to know if ok to take colace OTC if needed and occasionally Miralax.  Please call family with the instructions for the stool softeners  [phone]

## 2016-09-13 ENCOUNTER — Emergency Department (HOSPITAL_COMMUNITY): Payer: Medicare Other

## 2016-09-13 ENCOUNTER — Inpatient Hospital Stay (HOSPITAL_COMMUNITY)
Admission: EM | Admit: 2016-09-13 | Discharge: 2016-09-15 | DRG: 442 | Disposition: A | Discharge status: Home / Self Care | Payer: Medicare Other | Attending: Internal Medicine | Admitting: Internal Medicine

## 2016-09-13 ENCOUNTER — Encounter (HOSPITAL_COMMUNITY): Payer: Self-pay | Admitting: *Deleted

## 2016-09-13 DIAGNOSIS — K7581 Nonalcoholic steatohepatitis (NASH): Secondary | ICD-10-CM | POA: Diagnosis present

## 2016-09-13 DIAGNOSIS — Z79899 Other long term (current) drug therapy: Secondary | ICD-10-CM

## 2016-09-13 DIAGNOSIS — Z85828 Personal history of other malignant neoplasm of skin: Secondary | ICD-10-CM

## 2016-09-13 DIAGNOSIS — R4182 Altered mental status, unspecified: Secondary | ICD-10-CM | POA: Diagnosis present

## 2016-09-13 DIAGNOSIS — D649 Anemia, unspecified: Secondary | ICD-10-CM | POA: Diagnosis present

## 2016-09-13 DIAGNOSIS — K7460 Unspecified cirrhosis of liver: Secondary | ICD-10-CM | POA: Diagnosis not present

## 2016-09-13 DIAGNOSIS — F0390 Unspecified dementia without behavioral disturbance: Secondary | ICD-10-CM | POA: Diagnosis present

## 2016-09-13 DIAGNOSIS — K7290 Hepatic failure, unspecified without coma: Secondary | ICD-10-CM | POA: Diagnosis not present

## 2016-09-13 DIAGNOSIS — I10 Essential (primary) hypertension: Secondary | ICD-10-CM | POA: Diagnosis present

## 2016-09-13 DIAGNOSIS — F03A0 Unspecified dementia, mild, without behavioral disturbance, psychotic disturbance, mood disturbance, and anxiety: Secondary | ICD-10-CM | POA: Diagnosis present

## 2016-09-13 DIAGNOSIS — G4733 Obstructive sleep apnea (adult) (pediatric): Secondary | ICD-10-CM | POA: Diagnosis present

## 2016-09-13 DIAGNOSIS — R531 Weakness: Secondary | ICD-10-CM

## 2016-09-13 DIAGNOSIS — E872 Acidosis: Secondary | ICD-10-CM | POA: Diagnosis present

## 2016-09-13 DIAGNOSIS — R9431 Abnormal electrocardiogram [ECG] [EKG]: Secondary | ICD-10-CM | POA: Diagnosis not present

## 2016-09-13 DIAGNOSIS — Z9011 Acquired absence of right breast and nipple: Secondary | ICD-10-CM

## 2016-09-13 DIAGNOSIS — N390 Urinary tract infection, site not specified: Secondary | ICD-10-CM | POA: Diagnosis not present

## 2016-09-13 DIAGNOSIS — Z794 Long term (current) use of insulin: Secondary | ICD-10-CM

## 2016-09-13 DIAGNOSIS — E785 Hyperlipidemia, unspecified: Secondary | ICD-10-CM | POA: Diagnosis present

## 2016-09-13 DIAGNOSIS — Z8551 Personal history of malignant neoplasm of bladder: Secondary | ICD-10-CM

## 2016-09-13 DIAGNOSIS — C220 Liver cell carcinoma: Secondary | ICD-10-CM | POA: Diagnosis not present

## 2016-09-13 DIAGNOSIS — D638 Anemia in other chronic diseases classified elsewhere: Secondary | ICD-10-CM | POA: Diagnosis present

## 2016-09-13 DIAGNOSIS — Z87891 Personal history of nicotine dependence: Secondary | ICD-10-CM

## 2016-09-13 DIAGNOSIS — E669 Obesity, unspecified: Secondary | ICD-10-CM | POA: Diagnosis present

## 2016-09-13 DIAGNOSIS — R410 Disorientation, unspecified: Secondary | ICD-10-CM | POA: Diagnosis not present

## 2016-09-13 DIAGNOSIS — Z803 Family history of malignant neoplasm of breast: Secondary | ICD-10-CM

## 2016-09-13 DIAGNOSIS — E860 Dehydration: Secondary | ICD-10-CM | POA: Diagnosis present

## 2016-09-13 DIAGNOSIS — Z6825 Body mass index (BMI) 25.0-25.9, adult: Secondary | ICD-10-CM

## 2016-09-13 DIAGNOSIS — E1169 Type 2 diabetes mellitus with other specified complication: Secondary | ICD-10-CM | POA: Diagnosis present

## 2016-09-13 DIAGNOSIS — J209 Acute bronchitis, unspecified: Secondary | ICD-10-CM | POA: Diagnosis present

## 2016-09-13 DIAGNOSIS — Z853 Personal history of malignant neoplasm of breast: Secondary | ICD-10-CM

## 2016-09-13 DIAGNOSIS — E119 Type 2 diabetes mellitus without complications: Secondary | ICD-10-CM | POA: Diagnosis present

## 2016-09-13 DIAGNOSIS — D509 Iron deficiency anemia, unspecified: Secondary | ICD-10-CM | POA: Diagnosis present

## 2016-09-13 DIAGNOSIS — Z7982 Long term (current) use of aspirin: Secondary | ICD-10-CM

## 2016-09-13 DIAGNOSIS — Z923 Personal history of irradiation: Secondary | ICD-10-CM

## 2016-09-13 HISTORY — DX: Heart failure, unspecified: I50.9

## 2016-09-13 NOTE — ED Triage Notes (Signed)
Pt brought in by her husband who states the pt has had increasing confusion (baseline has had some confusion) tonight and has had weakness as well.

## 2016-09-13 NOTE — ED Provider Notes (Signed)
[facility] DEPT Provider Note   CSN: [id number] Arrival date & time: 09/13/16  2215  By signing my name below, I, Collene Leyden, attest that this documentation has been prepared under the direction and in the presence of [place], Anju Sereno, MD. Electronically Signed: Collene Leyden, Scribe. 09/13/16. 11:24 PM.  History   Chief Complaint Chief Complaint  Patient presents with  . Altered Mental Status   LEVEL 5 CAVEAT DUE TO DEMENTIA AND ALTERED MENTAL STATUS   HPI Comments: [name] [name] is a 76 y.o. female with a history of breast cancer, bladder cancer, DM, dementia, skin cancer, HTN, and HLD, who presents to the Emergency Department with husband, who reports sudden-onset, constant altered mental status that began 4 hours ago. Patient is demented at baseline, but mental status became worse 24 hours ago. According to husband the patient has been complaining of weakness.   The history is provided by the spouse. No language interpreter was used.  Altered Mental Status   This is a new problem. The current episode started yesterday. The problem has not changed since onset.Associated symptoms include confusion and weakness. Risk factors include a recent illness. Her past medical history is significant for dementia.    Past Medical History:  Diagnosis Date  . Adenocarcinoma, breast [facility])    sees Dr. Marcy Panning   . Allergy    environmental  . Arthritis    sees Dr. Hurley Cisco   . Barrett esophagus 12/20/11  . Bladder cancer ([place])   . Breast cancer ([place])   . Cancer, hepatocellular ([place])   . Dementia    mild  . Diabetes mellitus    sees Dr. Chalmers Cater   . Diverticulosis of colon (without mention of hemorrhage) 09/26/2004,09/10/2006  . Gastritis   . GERD (gastroesophageal reflux disease)   . Hemorrhoids   . Hepatic cirrhosis ([place])   . Hepatic steatosis   . Hiatal hernia   . Hyperglycemia   . Hyperlipidemia   . Hypertension   . IBS (irritable bowel syndrome)   . Inflammatory  polyps of colon ([place])   . Iron deficiency anemia   . Malignant neoplasm of colon, unspecified site 07/08/2003  . Personal history of chemotherapy   . Personal history of radiation therapy   . Sleep apnea    sees Dr. Gwenette Greet   . Transaminase or LDH elevation    sees Dr. Zenovia Jarred     Patient Active Problem List   Diagnosis Date Noted  . Hypokalemia 08/21/2016  . Hypomagnesemia 08/21/2016  . Diarrhea 08/21/2016  . SIRS (systemic inflammatory response syndrome) ([place]) 08/20/2016  . Systemic inflammatory response syndrome (SIRS) ([place]) 08/19/2016  . AKI (acute kidney injury) ([employer]) 08/19/2016  . Hepatocellular carcinoma ([place]) 08/01/2016  . Abnormal angiogram 08/01/2016  . Mild dementia 03/28/2016  . Mild cognitive impairment 12/22/2015  . Memory loss or impairment 06/22/2015  . Nausea with vomiting 02/07/2014  . Fatty liver 07/28/2013  . Barrett's esophagus 07/28/2013  . Acute on chronic diastolic HF (heart failure) ([place]) 07/10/2013  . Chest pain 07/08/2013  . Dyspnea on exertion 07/07/2013  . Exertional angina ([place]) 07/07/2013  . CHF (congestive heart failure) ([place]) 07/07/2013  . Lumbar radiculopathy 05/06/2013  . Anemia, unspecified 05/06/2013  . OSA (obstructive sleep apnea)- C-pap 02/04/2013  . Arthralgia of multiple joints 12/13/2012  . DIZZINESS 05/25/2010  . POSTHERPETIC NEURALGIA 10/26/2009  . GERD 05/18/2008  . HIATAL HERNIA 05/18/2008  . DIVERTICULITIS, HX OF 05/18/2008  . Essential hypertension 05/11/2008  . COLON CANCER, HX  OF 05/11/2008  . SKIN CANCER, HX OF 05/11/2008  . Malignant neoplasm of central portion of right female breast ([place]) 04/25/2007  . BLADDER CANCER , UNSPEC. 04/25/2007  . Diabetes mellitus type 2 in obese ([place]) 04/25/2007  . HYPERLIPIDEMIA 04/25/2007  . DIVERTICULOSIS, COLON 08/13/2006  . IBS 08/13/2006  . ELEVATION, TRANSAMINASE/LDH LEVELS 08/13/2006    Past Surgical History:  Procedure Laterality Date  . bladder cancer 2001    . BREAST  BIOPSY    . BREAST SURGERY  2001   lymph nodes removed and rt mastectomy  . CARPAL TUNNEL RELEASE    . [place]   for diverticulitis  . COLONOSCOPY    . ESOPHAGEAL DILATION    . incisonal hernia    . IR GENERIC HISTORICAL  04/25/2016   IR RADIOLOGIST EVAL & MGMT 04/25/2016 [name], MD [facility] INTER[facility] RAD  . IR GENERIC HISTORICAL  08/01/2016   IR 3D INDEPENDENT WKST 08/01/2016 [facility]  . IR GENERIC HISTORICAL  08/01/2016   IR ANGIOGRAM VISCERAL SELECTIVE 08/01/2016 [facility]  . IR GENERIC HISTORICAL  08/01/2016   IR EMBO TUMOR ORGAN ISCHEMIA INFARCT INC GUIDE ROADMAPPING 08/01/2016 [facility]  . IR GENERIC HISTORICAL  08/01/2016   IR US GUIDE VASC ACCESS RIGHT 08/01/2016 [facility]  . IR GENERIC HISTORICAL  08/01/2016   IR ANGIOGRAM SELECTIVE EACH ADDITIONAL VESSEL 08/01/2016 [facility]  . IR GENERIC HISTORICAL  08/01/2016   IR ANGIOGRAM SELECTIVE EACH ADDITIONAL VESSEL 08/01/2016 [facility]  . MASTECTOMY    . mastectomy and biopsy    . RADIOFREQUENCY ABLATION N/A 09/01/2016   Procedure: MICROWAVE THERMAL ABLATION;  Surgeon: [name], MD;  Location: [facility] ORS;  Service: Anesthesiology;  Laterality: N/A;  . sigmoid colectomy 2005     for cancer  . squamous cell CA  R  calf  2008      OB History    No data available       Home Medications    Prior to Admission medications   Medication Sig Start Date End Date Taking? Authorizing Provider  aspirin EC 81 MG tablet Take 81 mg by mouth daily.     [provider]  Calcium Carbonate-Vitamin D (CALCIUM 600+D) 600-400 MG-UNIT tablet Take 1 tablet by mouth daily.    [provider]  diphenhydrAMINE (BENADRYL) 25 MG tablet Take 25 mg by mouth at bedtime.    [provider]  diphenoxylate-atropine (LOMOTIL) 2.5-0.025 MG tablet Take 2 tablets by mouth 4 (four) times daily as needed for diarrhea or loose stools. 06/15/16   [name], [name], MD  furosemide (LASIX) 40 MG tablet Take 0.5  tablets (20 mg total) by mouth daily. 08/22/16   Sheikh, Omair Latif, DO  insulin NPH Human (HUMULIN N,NOVOLIN N) 100 UNIT/ML injection Inject 20 Units into the skin 2 (two) times daily with a meal.     [provider]  loratadine (CLARITIN) 10 MG tablet Take 10 mg by mouth daily.    [provider]  meclizine (ANTIVERT) 25 MG tablet Take 1 tablet (25 mg total) by mouth every 4 (four) hours as needed for dizziness. 05/13/14   [name], MD  metoCLOPramide (REGLAN) 5 MG tablet Take 5 mg by mouth 2 (two) times daily with a meal.  08/18/16   [provider]  metoprolol tartrate (LOPRESSOR) 25 MG tablet TAKE 1 TABLET TWICE DAILY 04/24/16   Lorretta Harp, MD  Omega-3 Fatty Acids (FISH OIL PO) Take 1 capsule  by mouth every evening.     [provider]  omeprazole (PRILOSEC) 20 MG capsule TAKE 1 CAPSULE TWICE DAILY BEFORE MEALS 06/22/16   [name], [name], MD  ondansetron (ZOFRAN-ODT) 4 MG disintegrating tablet DISSOLVE ONE TABLET IN MOUTH EVERY 8 HOURS AS NEEDED FOR NAUSEA OR VOMITING Patient taking differently: Take 4 mg by mouth every 8 (eight) hours as needed for nausea or vomiting.  06/15/16   [name], [name], MD  potassium chloride SA (K-DUR,KLOR-CON) 20 MEQ tablet Take 1 tablet (20 mEq total) by mouth daily. OVERDUE FOR FOLLOW UP. PLEASE CALL AND SCHEDULE [phone] 08/07/16   Lorretta Harp, MD  rosuvastatin (CRESTOR) 10 MG tablet TAKE 1/2 TABLET EVERY DAY Patient taking differently: TAKE 5 MG EVERY EVENING 10/15/15   [name], MD  traMADol (ULTRAM) 50 MG tablet Take 1 tablet (50 mg total) by mouth every 6 (six) hours as needed. Patient taking differently: Take 50 mg by mouth every 6 (six) hours as needed for moderate pain.  03/11/15   [name], MD    Family History Family History  Problem Relation Age of Onset  . Diabetes Mother   . Skin cancer Mother   . Lung cancer Father   . Lymphoma Sister   . Hypertension Sister   . Breast cancer Sister     . Diabetes Sister   . Hypertension Sister   . Breast cancer Sister   . Diabetes Sister   . Hypertension Sister   . Inflammatory bowel disease Daughter   . Esophageal cancer Son   . Stroke Neg Hx   . Colon cancer Neg Hx     Social History Social History  Substance Use Topics  . Smoking status: Former Smoker    Packs/day: 2.00    Years: 20.00    Types: Cigarettes    Quit date: 05/09/1975  . Smokeless tobacco: Never Used     Comment: smoked 1958-1977, up to 2 ppd  . Alcohol use 0.0 oz/week     Comment: rare     Allergies   Patient has no known allergies.   Review of Systems Review of Systems  Unable to perform ROS: Dementia  Constitutional: Positive for fatigue. Negative for diaphoresis and fever.  Respiratory: Negative for shortness of breath.   Neurological: Positive for weakness.  Psychiatric/Behavioral: Positive for confusion.     Physical Exam Updated Vital Signs BP (!) 144/74   Pulse 80   Temp 98.7 F (37.1 C) (Oral)   Resp 18   SpO2 100%   Physical Exam  Constitutional: She appears well-developed and well-nourished. No distress.  HENT:  Head: Normocephalic and atraumatic.  Mouth/Throat: Oropharynx is clear and moist.  Eyes: Conjunctivae and EOM are normal. Pupils are equal, round, and reactive to light.  Neck: Normal range of motion. Neck supple.  No bruits.   Cardiovascular: Normal rate, regular rhythm, normal heart sounds and intact distal pulses.  Exam reveals no gallop and no friction rub.   No murmur heard. Pulmonary/Chest: Effort normal and breath sounds normal. No stridor. She has no wheezes. She has no rales.  Abdominal: Soft. Bowel sounds are normal. There is no tenderness. There is no rebound and no guarding.  Musculoskeletal: Normal range of motion. She exhibits no edema, tenderness or deformity.  All compartments soft. Intact distal pulses.   Lymphadenopathy:    She has no cervical adenopathy.  Neurological: She is alert. She displays  normal reflexes.  Alert and oriented x2.   Skin:  Skin is warm and dry. Capillary refill takes less than 2 seconds.  Psychiatric: She has a normal mood and affect.  Nursing note and vitals reviewed.    ED Treatments / Results   Vitals:   09/14/16 0307 09/14/16 0345  BP: (!) 151/83 137/64  Pulse: 89 93  Resp: 20 16  Temp:  98.7 F (37.1 C)   DIAGNOSTIC STUDIES: Oxygen Saturation is 100% on RA, normal by my interpretation.    COORDINATION OF CARE: 11:21 PM Discussed treatment plan with husband at bedside and husband agreed to plan, which includes lab work and blood work.  Labs (all labs ordered are listed, but only abnormal results are displayed)  Results for orders placed or performed during the hospital encounter of 09/13/16  Comprehensive metabolic panel  Result Value Ref Range   Sodium 137 135 - 145 mmol/L   Potassium 4.4 3.5 - 5.1 mmol/L   Chloride 108 101 - 111 mmol/L   CO2 21 (L) 22 - 32 mmol/L   Glucose, Bld 168 (H) 65 - 99 mg/dL   BUN 17 6 - 20 mg/dL   Creatinine, Ser 0.97 0.44 - 1.00 mg/dL   Calcium 9.3 8.9 - 10.3 mg/dL   Total Protein 7.5 6.5 - 8.1 g/dL   Albumin 3.2 (L) 3.5 - 5.0 g/dL   AST 49 (H) 15 - 41 U/L   ALT 23 14 - 54 U/L   Alkaline Phosphatase 144 (H) 38 - 126 U/L   Total Bilirubin 1.0 0.3 - 1.2 mg/dL   GFR calc non Af Amer 56 (L) >60 mL/min   GFR calc Af Amer >60 >60 mL/min   Anion gap 8 5 - 15  Urinalysis, Routine w reflex microscopic  Result Value Ref Range   Color, Urine YELLOW YELLOW   APPearance HAZY (A) CLEAR   Specific Gravity, Urine 1.015 1.005 - 1.030   pH 7.0 5.0 - 8.0   Glucose, UA 50 (A) NEGATIVE mg/dL   Hgb urine dipstick NEGATIVE NEGATIVE   Bilirubin Urine NEGATIVE NEGATIVE   Ketones, ur NEGATIVE NEGATIVE mg/dL   Protein, ur NEGATIVE NEGATIVE mg/dL   Nitrite NEGATIVE NEGATIVE   Leukocytes, UA TRACE (A) NEGATIVE   RBC / HPF 0-5 0 - 5 RBC/hpf   WBC, UA 6-30 0 - 5 WBC/hpf   Bacteria, UA RARE (A) NONE SEEN   Squamous  Epithelial / LPF 6-30 (A) NONE SEEN  CBC with Differential/Platelet  Result Value Ref Range   WBC 5.1 4.0 - 10.5 K/uL   RBC 2.94 (L) 3.87 - 5.11 MIL/uL   Hemoglobin 8.3 (L) 12.0 - 15.0 g/dL   HCT 26.2 (L) 36.0 - 46.0 %   MCV 89.1 78.0 - 100.0 fL   MCH 28.2 26.0 - 34.0 pg   MCHC 31.7 30.0 - 36.0 g/dL   RDW 18.6 (H) 11.5 - 15.5 %   Platelets 212 150 - 400 K/uL   Neutrophils Relative % 57 %   Neutro Abs 2.9 1.7 - 7.7 K/uL   Lymphocytes Relative 26 %   Lymphs Abs 1.3 0.7 - 4.0 K/uL   Monocytes Relative 12 %   Monocytes Absolute 0.6 0.1 - 1.0 K/uL   Eosinophils Relative 4 %   Eosinophils Absolute 0.2 0.0 - 0.7 K/uL   Basophils Relative 1 %   Basophils Absolute 0.0 0.0 - 0.1 K/uL  CBG monitoring, ED  Result Value Ref Range   Glucose-Capillary 163 (H) 65 - 99 mg/dL  I-Stat Chem 8, ED  Result Value  Ref Range   Sodium 139 135 - 145 mmol/L   Potassium 4.3 3.5 - 5.1 mmol/L   Chloride 108 101 - 111 mmol/L   BUN 17 6 - 20 mg/dL   Creatinine, Ser 0.90 0.44 - 1.00 mg/dL   Glucose, Bld 167 (H) 65 - 99 mg/dL   Calcium, Ion 1.20 1.15 - 1.40 mmol/L   TCO2 24 0 - 100 mmol/L   Hemoglobin 8.5 (L) 12.0 - 15.0 g/dL   HCT 25.0 (L) 36.0 - 46.0 %  I-Stat Troponin, ED (not at [facility])  Result Value Ref Range   Troponin i, poc 0.00 0.00 - 0.08 ng/mL   Comment 3           Dg Chest 2 View  Result Date: 09/14/2016 CLINICAL DATA:  Altered mental status.  Nausea. EXAM: CHEST  2 VIEW COMPARISON:  08/19/2016 FINDINGS: Shallow inspiration. Cardiac enlargement without vascular congestion. Calcification in the mitral valve annulus. Peribronchial thickening may indicate acute or chronic bronchitis. No focal consolidation. No blunting of costophrenic angles. No pneumothorax. Surgical clips in the right axilla. Degenerative changes in the spine. IMPRESSION: Peribronchial thickening may indicate acute or chronic bronchitis. No focal consolidation. Cardiac enlargement. Electronically Signed   By: Lucienne Capers M.D.    On: 09/14/2016 00:19   Dg Chest 2 View  Result Date: 08/19/2016 CLINICAL DATA:  76 year old female with fever and confusion. Initial encounter. 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 EXAM: CHEST  2 VIEW COMPARISON:  Chest radiographs 07/07/2013 FINDINGS: Semi upright AP and lateral views of the chest. One of the patient arms is not raised on the lateral view. Mildly lower lung volumes. Stable cardiac size and mediastinal contours. Visualized tracheal air column is within normal limits. No pneumothorax. Pulmonary vascularity appears stable without overt edema. No pleural effusion or confluent opacity. Chronic increased pulmonary interstitial markings are stable. Postoperative changes at the right axilla are chronic. No acute osseous abnormality identified. Calcified aortic atherosclerosis. Negative visible bowel gas pattern. IMPRESSION: 1.  No acute cardiopulmonary abnormality. 2.  Calcified aortic atherosclerosis. Electronically Signed   By: Genevie Ann M.D.   On: 08/19/2016 15:30   Ct Head Wo Contrast  Result Date: 09/14/2016 CLINICAL DATA:  Confusion.  Altered mental status.  Weakness. EXAM: CT HEAD WITHOUT CONTRAST TECHNIQUE: Contiguous axial images were obtained from the base of the skull through the vertex without intravenous contrast. COMPARISON:  Head CT and brain MRI 07/04/2016 FINDINGS: Brain: Stable degree of atrophy and chronic small vessel ischemia. No evidence of hemorrhage, acute infarct, hydrocephalus, mass effect or midline shift. Vascular: Atherosclerosis of skullbase vasculature without hyperdense vessel or abnormal calcification. Skull: No fracture or focal lesion. Sinuses/Orbits: Paranasal sinuses and mastoid air cells are clear. The visualized orbits are unremarkable. Other: None. IMPRESSION: No acute intracranial abnormality. Stable atrophy and chronic small vessel ischemia. Electronically Signed   By: Jeb Levering M.D.   On:  09/14/2016 00:11   Ct Guide Tissue Ablation  Result Date: 09/01/2016 INDICATION: 76 year old female with NASH cirrhosis and hepatocellular carcinoma. She underwent drug-eluting bead chemoembolization on 08/01/2016 and presents today for definitive percutaneous thermal ablation. EXAM: CT-guided thermal ablation of liver lesion COMPARISON:  CT abdomen and pelvis 07/21/2016 MEDICATIONS: Ancef 2 gm IV; The antibiotic was administered in an appropriate time interval prior to needle puncture of the skin. ANESTHESIA/SEDATION: General - as administered by the Anesthesia department FLUOROSCOPY TIME:  Fluoroscopy Time: 0 minutes 0 seconds (0 mGy). COMPLICATIONS: None immediate. TECHNIQUE: Informed written  consent was obtained from the patient after a thorough discussion of the procedural risks, benefits and alternatives. All questions were addressed. Maximal Sterile Barrier Technique was utilized including caps, mask, sterile gowns, sterile gloves, sterile drape, hand hygiene and skin antiseptic. A timeout was performed prior to the initiation of the procedure. A planning axial CT scan was performed. There is persistent staining within the hepatic segment 6 branch arteries as well as a small amounts of staining within the embolized tumor. Suitable skin entry sites were selected and marked. Local anesthesia was attained by infiltration with 1% lidocaine after sterile prep and drape with chlorhexidine skin prep. Under intermittent CT guidance, two 15 cm PR probes were carefully advanced in used to bracket the lesion. Percutaneous thermal ablation was then performed with both probes powered at 65 watts for 5 minutes. Following ablation, the tissue tracts were ablated in the probes were removed. A post ablation CT scan of the chest with intravenous contrast was not performed. The ablation zone completely encompasses the mass. No evidence of immediate complication. Unfortunately, there is a new 1.5 cm enhancing nodule just  anterior to the most anterior and medial aspect of hepatic segment 6 immediately adjacent to the main portal vein. There is a smaller enhancing nodule measuring 0.9 cm in hepatic segment 2 just inferior to the heart. 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 This is new compared to prior imaging from March of 2018. This is not appear to be secondary to the ablation procedure itself as there is evidence of high attenuation material within the occluded portal vein on the initial planning CT images prior to ablation. FINDINGS: 1. Successful ablation of hepatic segment 6 hepatocellular carcinoma with complete coverage of the lesion. 2. 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 3. New 1.5 cm enhancing nodule versus porta hepatis lymph node adjacent to the main portal vein. 4. Enhancing 0.9 cm nodule in the superior aspect of hepatic segment 6. IMPRESSION: Technically successful percutaneous thermal ablation of hepatic segment 6 hepatocellular carcinoma. PLAN: 1. Admitted for observation overnight. Anticipate discharge in the morning. 2. Follow-up clinic visit in 4- 6 weeks with repeat abdominal MRI. I am concerned about new sites of multifocal disease. Signed, Criselda Peaches, MD [facility] Electronically Signed   By: [name] M.D.   On: 09/01/2016 13:20    EKG   EKG Interpretation  Date/Time:  Wednesday Sep 13 2016 22:32:32 EDT Ventricular Rate:  78 PR Interval:    QRS Duration: 98 QT Interval:  423 QTC Calculation: 482 R Axis:   5 Text Interpretation:  Sinus rhythm Confirmed by Randal Buba, Mase Dhondt ([id number]) on 09/14/2016 4:48:45 AM       Procedures Procedures (including critical care time)  Medications Ordered in ED  Medications  cefTRIAXone (ROCEPHIN) 1 g in dextrose 5 % 50 mL IVPB (1 g Intravenous New Bag/Given 09/14/16 0333)  ondansetron (ZOFRAN) injection 4 mg (4 mg Intravenous Given 09/14/16 0344)     Final  Clinical Impressions(s) / ED Diagnoses  Altered mental status: will admit to medicine    I personally performed the services described in this documentation, which was scribed in my presence. The recorded information has been reviewed and is accurate.      Fernande Treiber, MD 09/14/16 [phone]

## 2016-09-14 ENCOUNTER — Observation Stay (HOSPITAL_COMMUNITY): Payer: Medicare Other

## 2016-09-14 ENCOUNTER — Encounter (HOSPITAL_COMMUNITY): Payer: Self-pay | Admitting: Family Medicine

## 2016-09-14 DIAGNOSIS — Z87891 Personal history of nicotine dependence: Secondary | ICD-10-CM | POA: Diagnosis not present

## 2016-09-14 DIAGNOSIS — J209 Acute bronchitis, unspecified: Secondary | ICD-10-CM | POA: Diagnosis present

## 2016-09-14 DIAGNOSIS — D638 Anemia in other chronic diseases classified elsewhere: Secondary | ICD-10-CM | POA: Diagnosis present

## 2016-09-14 DIAGNOSIS — K7581 Nonalcoholic steatohepatitis (NASH): Secondary | ICD-10-CM | POA: Diagnosis present

## 2016-09-14 DIAGNOSIS — E669 Obesity, unspecified: Secondary | ICD-10-CM | POA: Diagnosis not present

## 2016-09-14 DIAGNOSIS — Z85828 Personal history of other malignant neoplasm of skin: Secondary | ICD-10-CM | POA: Diagnosis not present

## 2016-09-14 DIAGNOSIS — Z794 Long term (current) use of insulin: Secondary | ICD-10-CM | POA: Diagnosis not present

## 2016-09-14 DIAGNOSIS — R531 Weakness: Secondary | ICD-10-CM | POA: Diagnosis not present

## 2016-09-14 DIAGNOSIS — R410 Disorientation, unspecified: Secondary | ICD-10-CM | POA: Diagnosis not present

## 2016-09-14 DIAGNOSIS — G4733 Obstructive sleep apnea (adult) (pediatric): Secondary | ICD-10-CM | POA: Diagnosis not present

## 2016-09-14 DIAGNOSIS — E860 Dehydration: Secondary | ICD-10-CM | POA: Diagnosis present

## 2016-09-14 DIAGNOSIS — N390 Urinary tract infection, site not specified: Secondary | ICD-10-CM

## 2016-09-14 DIAGNOSIS — K7460 Unspecified cirrhosis of liver: Secondary | ICD-10-CM | POA: Diagnosis present

## 2016-09-14 DIAGNOSIS — E1169 Type 2 diabetes mellitus with other specified complication: Secondary | ICD-10-CM

## 2016-09-14 DIAGNOSIS — D649 Anemia, unspecified: Secondary | ICD-10-CM | POA: Diagnosis not present

## 2016-09-14 DIAGNOSIS — Z6825 Body mass index (BMI) 25.0-25.9, adult: Secondary | ICD-10-CM | POA: Diagnosis not present

## 2016-09-14 DIAGNOSIS — R509 Fever, unspecified: Secondary | ICD-10-CM | POA: Diagnosis not present

## 2016-09-14 DIAGNOSIS — R4182 Altered mental status, unspecified: Secondary | ICD-10-CM | POA: Diagnosis not present

## 2016-09-14 DIAGNOSIS — F0390 Unspecified dementia without behavioral disturbance: Secondary | ICD-10-CM | POA: Diagnosis not present

## 2016-09-14 DIAGNOSIS — Z9011 Acquired absence of right breast and nipple: Secondary | ICD-10-CM | POA: Diagnosis not present

## 2016-09-14 DIAGNOSIS — Z7982 Long term (current) use of aspirin: Secondary | ICD-10-CM | POA: Diagnosis not present

## 2016-09-14 DIAGNOSIS — Z803 Family history of malignant neoplasm of breast: Secondary | ICD-10-CM | POA: Diagnosis not present

## 2016-09-14 DIAGNOSIS — E119 Type 2 diabetes mellitus without complications: Secondary | ICD-10-CM | POA: Diagnosis not present

## 2016-09-14 DIAGNOSIS — I10 Essential (primary) hypertension: Secondary | ICD-10-CM | POA: Diagnosis not present

## 2016-09-14 DIAGNOSIS — D509 Iron deficiency anemia, unspecified: Secondary | ICD-10-CM | POA: Diagnosis present

## 2016-09-14 DIAGNOSIS — K7290 Hepatic failure, unspecified without coma: Secondary | ICD-10-CM | POA: Diagnosis not present

## 2016-09-14 DIAGNOSIS — E785 Hyperlipidemia, unspecified: Secondary | ICD-10-CM | POA: Diagnosis present

## 2016-09-14 DIAGNOSIS — C220 Liver cell carcinoma: Secondary | ICD-10-CM | POA: Diagnosis not present

## 2016-09-14 DIAGNOSIS — Z79899 Other long term (current) drug therapy: Secondary | ICD-10-CM | POA: Diagnosis not present

## 2016-09-14 DIAGNOSIS — E872 Acidosis: Secondary | ICD-10-CM | POA: Diagnosis present

## 2016-09-14 LAB — I-STAT CHEM 8, ED
BUN: 17 mg/dL (ref 6–20)
CALCIUM ION: 1.2 mmol/L (ref 1.15–1.40)
CHLORIDE: 108 mmol/L (ref 101–111)
CREATININE: 0.9 mg/dL (ref 0.44–1.00)
GLUCOSE: 167 mg/dL — AB (ref 65–99)
HCT: 25 % — ABNORMAL LOW (ref 36.0–46.0)
Hemoglobin: 8.5 g/dL — ABNORMAL LOW (ref 12.0–15.0)
Potassium: 4.3 mmol/L (ref 3.5–5.1)
Sodium: 139 mmol/L (ref 135–145)
TCO2: 24 mmol/L (ref 0–100)

## 2016-09-14 LAB — COMPREHENSIVE METABOLIC PANEL
ALT: 23 U/L (ref 14–54)
ANION GAP: 8 (ref 5–15)
AST: 49 U/L — ABNORMAL HIGH (ref 15–41)
Albumin: 3.2 g/dL — ABNORMAL LOW (ref 3.5–5.0)
Alkaline Phosphatase: 144 U/L — ABNORMAL HIGH (ref 38–126)
BUN: 17 mg/dL (ref 6–20)
CHLORIDE: 108 mmol/L (ref 101–111)
CO2: 21 mmol/L — AB (ref 22–32)
Calcium: 9.3 mg/dL (ref 8.9–10.3)
Creatinine, Ser: 0.97 mg/dL (ref 0.44–1.00)
GFR, EST NON AFRICAN AMERICAN: 56 mL/min — AB (ref >60)
Glucose, Bld: 168 mg/dL — ABNORMAL HIGH (ref 65–99)
POTASSIUM: 4.4 mmol/L (ref 3.5–5.1)
SODIUM: 137 mmol/L (ref 135–145)
Total Bilirubin: 1 mg/dL (ref 0.3–1.2)
Total Protein: 7.5 g/dL (ref 6.5–8.1)

## 2016-09-14 LAB — URINALYSIS, ROUTINE W REFLEX MICROSCOPIC
Bilirubin Urine: NEGATIVE
Glucose, UA: 50 mg/dL — AB
HGB URINE DIPSTICK: NEGATIVE
Ketones, ur: NEGATIVE mg/dL
NITRITE: NEGATIVE
Protein, ur: NEGATIVE mg/dL
SPECIFIC GRAVITY, URINE: 1.015 (ref 1.005–1.030)
pH: 7 (ref 5.0–8.0)

## 2016-09-14 LAB — GLUCOSE, CAPILLARY
GLUCOSE-CAPILLARY: 191 mg/dL — AB (ref 65–99)
GLUCOSE-CAPILLARY: 197 mg/dL — AB (ref 65–99)
Glucose-Capillary: 176 mg/dL — ABNORMAL HIGH (ref 65–99)
Glucose-Capillary: 152 mg/dL — ABNORMAL HIGH (ref 65–99)

## 2016-09-14 LAB — CBC WITH DIFFERENTIAL/PLATELET
BASOS ABS: 0 10*3/uL (ref 0.0–0.1)
Basophils Relative: 1 %
Eosinophils Absolute: 0.2 10*3/uL (ref 0.0–0.7)
Eosinophils Relative: 4 %
HCT: 26.2 % — ABNORMAL LOW (ref 36.0–46.0)
Hemoglobin: 8.3 g/dL — ABNORMAL LOW (ref 12.0–15.0)
LYMPHS ABS: 1.3 10*3/uL (ref 0.7–4.0)
Lymphocytes Relative: 26 %
MCH: 28.2 pg (ref 26.0–34.0)
MCHC: 31.7 g/dL (ref 30.0–36.0)
MCV: 89.1 fL (ref 78.0–100.0)
MONO ABS: 0.6 10*3/uL (ref 0.1–1.0)
MONOS PCT: 12 %
NEUTROS ABS: 2.9 10*3/uL (ref 1.7–7.7)
Neutrophils Relative %: 57 %
Platelets: 212 10*3/uL (ref 150–400)
RBC: 2.94 MIL/uL — ABNORMAL LOW (ref 3.87–5.11)
RDW: 18.6 % — AB (ref 11.5–15.5)
WBC: 5.1 10*3/uL (ref 4.0–10.5)

## 2016-09-14 LAB — I-STAT TROPONIN, ED: Troponin i, poc: 0 ng/mL (ref 0.00–0.08)

## 2016-09-14 LAB — AMMONIA: Ammonia: 66 umol/L — ABNORMAL HIGH (ref 9–35)

## 2016-09-14 LAB — CBG MONITORING, ED: GLUCOSE-CAPILLARY: 163 mg/dL — AB (ref 65–99)

## 2016-09-14 LAB — SAVE SMEAR

## 2016-09-14 LAB — TSH: TSH: 1.848 u[IU]/mL (ref 0.350–4.500)

## 2016-09-14 MED ORDER — CEPHALEXIN 500 MG PO CAPS
500.0000 mg | ORAL_CAPSULE | Freq: Two times a day (BID) | ORAL | Status: DC
  Administered 2016-09-14 – 2016-09-15 (×2): 500 mg via ORAL
  Filled 2016-09-14 (×2): qty 1

## 2016-09-14 MED ORDER — ENSURE ENLIVE PO LIQD: 237.0000 mL | Freq: Two times a day (BID) | ORAL | Status: DC

## 2016-09-14 MED ORDER — ROSUVASTATIN CALCIUM 5 MG PO TABS: 5.0000 mg | ORAL_TABLET | Freq: Every day | ORAL | Status: DC

## 2016-09-14 MED ORDER — BOOST PLUS PO LIQD
237.0000 mL | Freq: Two times a day (BID) | ORAL | Status: DC
  Administered 2016-09-15: 237 mL via ORAL
  Filled 2016-09-14 (×3): qty 237

## 2016-09-14 MED ORDER — ONDANSETRON HCL 4 MG/2ML IJ SOLN
4.0000 mg | Freq: Once | INTRAMUSCULAR | Status: AC
  Administered 2016-09-14: 4 mg via INTRAVENOUS
  Filled 2016-09-14: qty 2

## 2016-09-14 MED ORDER — INSULIN GLARGINE 100 UNIT/ML ~~LOC~~ SOLN
20.0000 [IU] | Freq: Every day | SUBCUTANEOUS | Status: DC
  Filled 2016-09-14: qty 0.2

## 2016-09-14 MED ORDER — ONDANSETRON HCL 4 MG/2ML IJ SOLN
4.0000 mg | Freq: Four times a day (QID) | INTRAMUSCULAR | Status: DC | PRN
  Administered 2016-09-14: 4 mg via INTRAVENOUS
  Filled 2016-09-14: qty 2

## 2016-09-14 MED ORDER — ENOXAPARIN SODIUM 40 MG/0.4ML ~~LOC~~ SOLN
40.0000 mg | SUBCUTANEOUS | Status: DC
  Administered 2016-09-14 – 2016-09-15 (×2): 40 mg via SUBCUTANEOUS
  Filled 2016-09-14 (×2): qty 0.4

## 2016-09-14 MED ORDER — LACTULOSE 10 GM/15ML PO SOLN
20.0000 g | Freq: Three times a day (TID) | ORAL | Status: DC
  Administered 2016-09-14 – 2016-09-15 (×4): 20 g via ORAL
  Filled 2016-09-14 (×5): qty 30

## 2016-09-14 MED ORDER — BOOST PLUS PO LIQD
237.0000 mL | Freq: Three times a day (TID) | ORAL | Status: DC
  Filled 2016-09-14 (×2): qty 237

## 2016-09-14 MED ORDER — LORAZEPAM 0.5 MG PO TABS
0.5000 mg | ORAL_TABLET | Freq: Two times a day (BID) | ORAL | Status: DC | PRN
  Administered 2016-09-14: 0.5 mg via ORAL
  Filled 2016-09-14: qty 1

## 2016-09-14 MED ORDER — DOCUSATE SODIUM 100 MG PO CAPS: 100.0000 mg | ORAL_CAPSULE | Freq: Every day | ORAL | Status: DC | PRN

## 2016-09-14 MED ORDER — DEXTROSE 5 % IV SOLN
1.0000 g | Freq: Once | INTRAVENOUS | Status: AC
  Administered 2016-09-14: 1 g via INTRAVENOUS
  Filled 2016-09-14: qty 10

## 2016-09-14 MED ORDER — PANTOPRAZOLE SODIUM 40 MG PO TBEC
40.0000 mg | DELAYED_RELEASE_TABLET | Freq: Every day | ORAL | Status: DC
Start: 2016-09-14 — End: 2016-09-15
  Administered 2016-09-14 – 2016-09-15 (×2): 40 mg via ORAL
  Filled 2016-09-14 (×2): qty 1

## 2016-09-14 MED ORDER — METOPROLOL TARTRATE 25 MG PO TABS
25.0000 mg | ORAL_TABLET | Freq: Two times a day (BID) | ORAL | Status: DC
  Administered 2016-09-14 – 2016-09-15 (×3): 25 mg via ORAL
  Filled 2016-09-14 (×3): qty 1

## 2016-09-14 MED ORDER — SODIUM CHLORIDE 0.9 % IV SOLN
INTRAVENOUS | Status: DC
  Administered 2016-09-14 (×2): via INTRAVENOUS

## 2016-09-14 MED ORDER — INSULIN ASPART 100 UNIT/ML ~~LOC~~ SOLN
0.0000 [IU] | Freq: Three times a day (TID) | SUBCUTANEOUS | Status: DC
  Administered 2016-09-14 (×3): 3 [IU] via SUBCUTANEOUS
  Administered 2016-09-15: 2 [IU] via SUBCUTANEOUS
  Administered 2016-09-15: 3 [IU] via SUBCUTANEOUS

## 2016-09-14 MED ORDER — ASPIRIN EC 81 MG PO TBEC
81.0000 mg | DELAYED_RELEASE_TABLET | Freq: Every day | ORAL | Status: DC
  Administered 2016-09-14 – 2016-09-15 (×2): 81 mg via ORAL
  Filled 2016-09-14 (×2): qty 1

## 2016-09-14 MED ORDER — SODIUM CHLORIDE 0.9% FLUSH
3.0000 mL | Freq: Two times a day (BID) | INTRAVENOUS | Status: DC
  Administered 2016-09-14 – 2016-09-15 (×2): 3 mL via INTRAVENOUS

## 2016-09-14 MED ORDER — INSULIN GLARGINE 100 UNIT/ML ~~LOC~~ SOLN
10.0000 [IU] | Freq: Every day | SUBCUTANEOUS | Status: DC
  Administered 2016-09-14 – 2016-09-15 (×2): 10 [IU] via SUBCUTANEOUS
  Filled 2016-09-14 (×2): qty 0.1

## 2016-09-14 NOTE — Evaluation (Signed)
Occupational Therapy Evaluation Patient Details Name: [name] [name] MRN: [id number] DOB: [birth year] Today's Date: 09/14/2016    History of Present Illness ADEN YOUNGMAN is a 76 y.o. female with a past medical history significant for NASH cirrhosis, DM, HTN, mild dementia, and HCC recent chemoembolization who presents with acute confusion.   Clinical Impression   Pt admitted with confusion. Pt currently with functional limitations due to the deficits listed below (see OT Problem List). Pt will benefit from skilled OT to increase their safety and independence with ADL and functional mobility for ADL to facilitate discharge to venue listed below.    Follow Up Recommendations  Supervision/Assistance - 24 hour;No OT follow up    Equipment Recommendations  None recommended by OT       Precautions / Restrictions Precautions Precautions: Fall      Mobility Bed Mobility Overal bed mobility: Needs Assistance Bed Mobility: Supine to Sit     Supine to sit: Min assist     General bed mobility comments: increased time and VC for technique  Transfers Overall transfer level: Needs assistance Equipment used: 1 person hand held assist Transfers: Stand Pivot Transfers;Sit to/from Stand Sit to Stand: Min assist Stand pivot transfers: Min assist       General transfer comment: husband states pt normally able to do the steps    Balance Overall balance assessment: Needs assistance Sitting-balance support: No upper extremity supported Sitting balance-Leahy Scale: Good     Standing balance support: No upper extremity supported Standing balance-Leahy Scale: Fair                             ADL either performed or assessed with clinical judgement   ADL Overall ADL's : Needs assistance/impaired Eating/Feeding: Set up;Sitting   Grooming: Set up;Sitting   Upper Body Bathing: Set up;Sitting   Lower Body Bathing: Moderate assistance;Sit to/from stand;Cueing for  safety;Cueing for sequencing   Upper Body Dressing : Set up;Sitting   Lower Body Dressing: Moderate assistance;Sit to/from stand;Cueing for safety;Cueing for sequencing   Toilet Transfer: Minimal assistance;Ambulation;Stand-pivot Toilet Transfer Details (indicate cue type and reason): Vc for hand placement Toileting- Clothing Manipulation and Hygiene: Minimal assistance;Sit to/from stand;Cueing for safety;Cueing for sequencing Toileting - Clothing Manipulation Details (indicate cue type and reason): Vc to stand up right        General ADL Comments: pt moving slow with short small steps to chair.  Pt has been nausous earlier and is now feeling better and feels like she can have lunch. Pt agreed to sit in chair for lunch     Vision Patient Visual Report: No change from baseline              Pertinent Vitals/Pain Pain Assessment: No/denies pain     Hand Dominance     Extremity/Trunk Assessment Upper Extremity Assessment Upper Extremity Assessment: Generalized weakness           Communication Communication Communication: HOH   Cognition Arousal/Alertness: Awake/alert Behavior During Therapy: WFL for tasks assessed/performed Overall Cognitive Status: History of cognitive impairments - at baseline                                                [place] expects to be discharged to:: Private residence Living Arrangements: Spouse/significant other Available Help at Discharge:  Family Type of Home: House Home Access: Stairs to enter     Home Layout: Two level;Bed/bath upstairs     Bathroom Shower/Tub: Walk-in Psychologist, prison and probation services: Standard     Home Equipment: Environmental consultant - 4 wheels;Shower seat          Prior Functioning/Environment          Comments: uses rollator in the community. walks without a device inside home        OT Problem List: Decreased strength;Decreased activity tolerance;Decreased knowledge of use of  DME or AE;Impaired balance (sitting and/or standing)      OT Treatment/Interventions: Self-care/ADL training;DME and/or AE instruction;Patient/family education    OT Goals(Current goals can be found in the care plan section) Acute Rehab OT Goals Patient Stated Goal: get to the beach  OT Goal Formulation: With patient Time For Goal Achievement: 09/28/16 Potential to Achieve Goals: Good  OT Frequency: Min 2X/week   Barriers to D/C:               AM-PAC PT "6 Clicks" Daily Activity     Outcome Measure Help from another person eating meals?: None Help from another person taking care of personal grooming?: None Help from another person toileting, which includes using toliet, bedpan, or urinal?: A Little Help from another person bathing (including washing, rinsing, drying)?: A Little Help from another person to put on and taking off regular upper body clothing?: A Little Help from another person to put on and taking off regular lower body clothing?: A Little 6 Click Score: 20   End of Session Equipment Utilized During Treatment: Rolling walker Nurse Communication: Mobility status  Activity Tolerance: Patient tolerated treatment well Patient left: in chair;with chair alarm set;with family/visitor present  OT Visit Diagnosis: Unsteadiness on feet (R26.81);Muscle weakness (generalized) (M62.81)                Time: 1255-1315 OT Time Calculation (min): 20 min Charges:  OT General Charges $OT Visit: 1 Procedure OT Evaluation $OT Eval Moderate Complexity: 1 Procedure G-Codes: OT G-codes **NOT FOR INPATIENT CLASS** Functional Assessment Tool Used: Clinical judgement Functional Limitation: Self care Self Care Current Status (I6270): At least 20 percent but less than 40 percent impaired, limited or restricted Self Care Goal Status (J5009): At least 1 percent but less than 20 percent impaired, limited or restricted   Kari Baars, [place]  Payton Mccallum D 09/14/2016,  1:33 PM

## 2016-09-14 NOTE — Evaluation (Signed)
Physical Therapy Evaluation Patient Details Name: [name] [name] MRN: [id number] DOB: 25-Dec-1940 Today's Date: 09/14/2016   History of Present Illness  Pt is a 76 y.o. female with a past medical history significant for NASH cirrhosis, DM, HTN, mild dementia, and hepatocellular carcinoma  recent chemoembolization who presents with acute confusion.  Clinical Impression  Pt admitted with above diagnosis. Pt currently with functional limitations due to the deficits listed below (see PT Problem List).  Pt will benefit from skilled PT to increase their independence and safety with mobility to allow discharge to the venue listed below.  Pt and spouse reports pt had just finished with HHPT from recent admission and was no longer using RW for mobility in the home (used in community).  Pt unsteady today so recommended to use RW upon d/c and have spouse stand by assist for safety especially with stairs (bed upstairs with rail).  Pt may progress to no needs however would recommend HHPT again at this time.     Follow Up Recommendations Home health PT;Supervision/Assistance - 24 hour    Equipment Recommendations  None recommended by PT    Recommendations for Other Services       Precautions / Restrictions Precautions Precautions: Fall      Mobility  Bed Mobility Overal bed mobility: Needs Assistance Bed Mobility: Supine to Sit;Sit to Supine     Supine to sit: Min assist Sit to supine: Min assist   General bed mobility comments: verbal cues for technique, assist for trunk upright and LEs onto bed  Transfers Overall transfer level: Needs assistance Equipment used: Rolling walker (2 wheeled) Transfers: Sit to/from Stand Sit to Stand: Min assist Stand pivot transfers: Min guard       General transfer comment: verbal cues for hand placement, min/guard for safety  Ambulation/Gait Ambulation/Gait assistance: Min guard Ambulation Distance (Feet): 100 Feet Assistive device: Rolling walker  (2 wheeled) Gait Pattern/deviations: Step-through pattern;Trunk flexed;Decreased stride length Gait velocity: decreased   General Gait Details: verbal cues for RW positioning, tends to keep RW too far ahead, also has difficulty with negotiating RW around obstacles, increased time required  Stairs            Wheelchair Mobility    Modified Rankin (Stroke Patients Only)       Balance Overall balance assessment: Needs assistance Sitting-balance support: No upper extremity supported Sitting balance-Leahy Scale: Good     Standing balance support: No upper extremity supported Standing balance-Leahy Scale: Fair Standing balance comment: fair static stance, requires RW for steadying with ambulation                             Pertinent Vitals/Pain Pain Assessment: No/denies pain    Home Living Family/patient expects to be discharged to:: Private residence Living Arrangements: Spouse/significant other Available Help at Discharge: Family Type of Home: House Home Access: Stairs to enter     Home Layout: Two level;Bed/bath upstairs Home Equipment: Environmental consultant - 4 wheels;Shower seat      Prior Function Level of Independence: Independent with assistive device(s)         Comments: uses rollator in the community. walks without a device inside home     Hand Dominance        Extremity/Trunk Assessment   Upper Extremity Assessment Upper Extremity Assessment: Generalized weakness    Lower Extremity Assessment Lower Extremity Assessment: Generalized weakness       Communication   Communication: HOH  Cognition  Arousal/Alertness: Awake/alert Behavior During Therapy: WFL for tasks assessed/performed Overall Cognitive Status: History of cognitive impairments - at baseline                                        General Comments      Exercises     Assessment/Plan    PT Assessment Patient needs continued PT services  PT Problem List  Decreased strength;Decreased mobility;Decreased balance;Decreased activity tolerance;Decreased knowledge of use of DME       PT Treatment Interventions DME instruction;Gait training;Therapeutic exercise;Therapeutic activities;Functional mobility training;Balance training;Stair training;Patient/family education    PT Goals (Current goals can be found in the Care Plan section)  Acute Rehab PT Goals Patient Stated Goal: get to the beach  PT Goal Formulation: With patient/family Time For Goal Achievement: 09/21/16 Potential to Achieve Goals: Good    Frequency Min 3X/week   Barriers to discharge        Co-evaluation               AM-PAC PT "6 Clicks" Daily Activity  Outcome Measure Difficulty turning over in bed (including adjusting bedclothes, sheets and blankets)?: None Difficulty moving from lying on back to sitting on the side of the bed? : A Little Difficulty sitting down on and standing up from a chair with arms (e.g., wheelchair, bedside commode, etc,.)?: A Little Help needed moving to and from a bed to chair (including a wheelchair)?: A Little Help needed walking in hospital room?: A Little Help needed climbing 3-5 steps with a railing? : A Lot 6 Click Score: 18    End of Session Equipment Utilized During Treatment: Gait belt Activity Tolerance: Patient tolerated treatment well Patient left: in bed;with call bell/phone within reach;with family/visitor present;with bed alarm set Nurse Communication: Mobility status PT Visit Diagnosis: Unsteadiness on feet (R26.81)    Time: 6256-3893 PT Time Calculation (min) (ACUTE ONLY): 16 min   Charges:   PT Evaluation $PT Eval Low Complexity: 1 Procedure     PT G Codes:   PT G-Codes **NOT FOR INPATIENT CLASS** Functional Assessment Tool Used: Clinical judgement;AM-PAC 6 Clicks Basic Mobility Functional Limitation: Mobility: Walking and moving around Mobility: Walking and Moving Around Current Status (T3428): At least 20  percent but less than 40 percent impaired, limited or restricted Mobility: Walking and Moving Around Goal Status [phone]): At least 1 percent but less than 20 percent impaired, limited or restricted   Carmelia Bake, PT, DPT 09/14/2016 Pager: [phone]   York Ram E 09/14/2016, 2:56 PM

## 2016-09-14 NOTE — Progress Notes (Signed)
Initial Nutrition Assessment  DOCUMENTATION CODES:   Non-severe (moderate) malnutrition in context of chronic illness  INTERVENTION:   -D/c Ensure supplements per patient -Provide Boost Plus chocolate TID- Each supplement provides 360kcal and 14g protein.   -Encourage PO intake -RD to continue to monitor  NUTRITION DIAGNOSIS:   Malnutrition (Moderate) related to chronic illness, cancer and cancer related treatments as evidenced by energy intake < or equal to 75% for > or equal to 1 month, mild depletion of body fat, moderate depletions of muscle mass.  GOAL:   Patient will meet greater than or equal to 90% of their needs  MONITOR:   PO intake, Supplement acceptance, Labs, Weight trends, I & O's  REASON FOR ASSESSMENT:   Malnutrition Screening Tool    ASSESSMENT:   76 y.o. female with a past medical history significant for NASH cirrhosis, DM, HTN, mild dementia, and HCC recent chemoembolization who presents with acute confusion.  Patient in room with husband at bedside. Pt with AMS and answering questions but pt's husband gives more reliable history. Pt reports good appetite except for this morning given nausea. Pt about to receive nausea medications. States she eats 3 meals a day. Denies issues with chewing foods but states it is hard for her to swallow pills. However, pt's husband states she has not had a good appetite for a while now. Usually eats 2 meals a day but eats very little of these meals. States he cooks her meals for her and always provides a protein usually meat with every meal. Pt drinks 1 Boost supplement every 3-4 days at home. States pt has always had issues swallowing pills since she was a teenager.  RD to d/c Ensure and order Boost instead. Encouraged pt to try and drink at least 2 Boosts daily.   Per chart review, pt has lost 9 lb since 2/16 (5% wt loss x 3 months, insignificant for time frame). Nutrition-Focused physical exam completed. Findings are mild  fat depletion, moderate muscle depletion, and no edema.    Medications: Protonix tablet daily, IV Zofran PRN Labs reviewed: CBGs: 191  Diet Order:  Diet heart healthy/carb modified Room service appropriate? Yes; Fluid consistency: Thin  Skin:  Reviewed, no issues  Last BM:  PTA  Height:   Ht Readings from Last 1 Encounters:  09/14/16 5\' 5"  (1.651 m)    Weight:   Wt Readings from Last 1 Encounters:  09/14/16 155 lb (70.3 kg)    Ideal Body Weight:  56.8 kg  BMI:  Body mass index is 25.79 kg/m.  Estimated Nutritional Needs:   Kcal:  1800-2000  Protein:  85-95g  Fluid:  1.8L/day  EDUCATION NEEDS:   No education needs identified at this time  Clayton Bibles, MS, RD, LDN Pager: [phone] After Hours Pager: [phone]

## 2016-09-14 NOTE — Care Management Note (Signed)
Case Management Note  Patient Details  Name: [name] [name] MRN: [id number] Date of Birth: 01-28-1941  Subjective/Objective:    ams poss uti                Action/Plan: lives with spouse and adult children Date:  Sep 14, 2016  Chart reviewed for concurrent status and case management needs.  Will continue to follow patient progress.  Discharge Planning: following for needs  Expected discharge date: [id number]  Velva Harman, BSN, [place], [place]   Expected Discharge Date:                  Expected Discharge Plan:     In-House Referral:     Discharge planning Services     Post Acute Care Choice:    Choice offered to:     DME Arranged:    DME Agency:     HH Arranged:    HH Agency:     Status of Service:     If discussed at [place] of Stay Meetings, dates discussed:    Additional Comments:  Leeroy Cha, RN 09/14/2016, 9:20 AM

## 2016-09-14 NOTE — Progress Notes (Signed)
PROGRESS NOTE    [name]  YIF:[id number] DOB: 1940/09/19 DOA: 09/13/2016 PCP: [name], MD   Brief Narrative:  [name] [name] is a 76 y.o. female with a past medical history significant for NASH cirrhosis, DM, HTN, mild dementia, and HCC recent chemoembolization who presents with acute confusion.  The patient recently underwent doxorubicin embedded beads to the liver mass, then thermoablation wtihout event.  In between these procedures she was admitted here with SIRS criteria and confusion, but cultures of blood and urine grew nothing, influenza A and B were negative, and antibiotics were stopped after 2 days and she improved.  Since her last ablation 2 weeks ago, her family feel she has been well.  She ambulates, has mild dementia (now off Aricept and Namenda), but is functioning well.  This morning, per family she was fine, they had no inkling of anything wrong, she had no fever, malaise, change in activity or function, complaints of dysuria or foul-urine, abdominal pain, cough or sputum production, and then around 4PM she was walking down the hall and stopped and had to hold the wall and said "I'm so weak", and ever since then she has seemed out of it.  Her husband tried to take her upstairs to go to bed a few hours later and when he told her to put her nightgown on, she said she'd never had a nightgown.  Finally, they thought she was too confused so they brought her to the ER.  No LOC, no focal weakness, no facial droop, no slurred speech.    Assessment & Plan:   Principal Problem:   Altered mental status Active Problems:   Diabetes mellitus type 2 in obese (HCC)   Essential hypertension   OSA (obstructive sleep apnea)- C-pap   Anemia, unspecified   Mild dementia   Hepatocellular carcinoma (HCC)  1-Acute encephalopathy, possible hepatic encephalopathy, component of infection.  Ammonia elevated at 60. Started lactulose. Titrate for 3 BM a day.  Treat for UTI also.  PT  evaluation.  MRI negative for stroke or mets.  Bili normal, LFT stable.   2-DM; hold Metformin while in the hospital.  On gargline.   3-HTN; continue with metoprolol.  HLD; discontinue statin due to liver failure.   4-HCC, cirrhosis, [place].  Recent embolization , 2 weeks ago. bilirubin normal.  Started lactulose.  Discontinue statins.  Dr Benay Spice added to rounding team.  Dr Valla Leaver informed of patient admission.  Anemia; hb stable.  Check anemia panel.    DVT prophylaxis: Lovenox Code Status: full code.  Family Communication: care discussed with husband who was at bedside.  Disposition Plan: home in 24 hours depending mental status.    Consultants:   none   Procedures: none  Antimicrobials: Keflex.   Subjective: She is alert, follows command. She doesn't know why she is in the hospital.  She recognized her husband.   Objective: Vitals:   09/14/16 0109 09/14/16 0307 09/14/16 0345 09/14/16 0511  BP: (!) 151/80 (!) 151/83 137/64 137/64  Pulse: 87 89 93 98  Resp: 18 20 16    Temp:   98.7 F (37.1 C) 98.7 F (37.1 C)  TempSrc:   Oral Oral  SpO2: 94% 95% 97% 95%  Weight:    70.3 kg (155 lb)  Height:    5\' 5"  (1.651 m)    Intake/Output Summary (Last 24 hours) at 09/14/16 1340 Last data filed at 09/14/16 0930  Gross per 24 hour  Intake  940 ml  Output                0 ml  Net              940 ml   Filed Weights   09/14/16 0511  Weight: 70.3 kg (155 lb)    Examination:  General exam: Appears calm and comfortable , confuse Respiratory system: Clear to auscultation. Respiratory effort normal. Cardiovascular system: S1 & S2 heard, RRR. No JVD, murmurs, rubs, gallops or clicks. No pedal edema. Gastrointestinal system: Abdomen is nondistended, soft and nontender. No organomegaly or masses felt. Normal bowel sounds heard. Central nervous system: Alert. No focal neurological deficits. Extremities: Symmetric 5 x 5 power. Skin: No rashes, lesions  or ulcers     Data Reviewed: I have personally reviewed following labs and imaging studies  CBC:  Recent Labs Lab 09/14/16 0021 09/14/16 0044  WBC 5.1  --   NEUTROABS 2.9  --   HGB 8.3* 8.5*  HCT 26.2* 25.0*  MCV 89.1  --   PLT 212  --    Basic Metabolic Panel:  Recent Labs Lab 09/14/16 0021 09/14/16 0044  NA 137 139  K 4.4 4.3  CL 108 108  CO2 21*  --   GLUCOSE 168* 167*  BUN 17 17  CREATININE 0.97 0.90  CALCIUM 9.3  --    GFR: Estimated Creatinine Clearance: 53.1 mL/min (by C-G formula based on SCr of 0.9 mg/dL). Liver Function Tests:  Recent Labs Lab 09/14/16 0021  AST 49*  ALT 23  ALKPHOS 144*  BILITOT 1.0  PROT 7.5  ALBUMIN 3.2*   No results for input(s): LIPASE, AMYLASE in the last 168 hours.  Recent Labs Lab 09/14/16 0609  AMMONIA 66*   Coagulation Profile: No results for input(s): INR, PROTIME in the last 168 hours. Cardiac Enzymes: No results for input(s): CKTOTAL, CKMB, CKMBINDEX, TROPONINI in the last 168 hours. BNP (last 3 results) No results for input(s): PROBNP in the last 8760 hours. HbA1C: No results for input(s): HGBA1C in the last 72 hours. CBG:  Recent Labs Lab 09/14/16 0019 09/14/16 0901 09/14/16 1156  GLUCAP 163* 191* 197*   Lipid Profile: No results for input(s): CHOL, HDL, LDLCALC, TRIG, CHOLHDL, LDLDIRECT in the last 72 hours. Thyroid Function Tests:  Recent Labs  09/14/16 0609  TSH 1.848   Anemia Panel: No results for input(s): VITAMINB12, FOLATE, FERRITIN, TIBC, IRON, RETICCTPCT in the last 72 hours. Sepsis Labs: No results for input(s): PROCALCITON, LATICACIDVEN in the last 168 hours.  No results found for this or any previous visit (from the past 240 hour(s)).       Radiology Studies: Dg Chest 2 View  Result Date: 09/14/2016 CLINICAL DATA:  Altered mental status.  Nausea. EXAM: CHEST  2 VIEW COMPARISON:  08/19/2016 FINDINGS: Shallow inspiration. Cardiac enlargement without vascular congestion.  Calcification in the mitral valve annulus. Peribronchial thickening may indicate acute or chronic bronchitis. No focal consolidation. No blunting of costophrenic angles. No pneumothorax. Surgical clips in the right axilla. Degenerative changes in the spine. IMPRESSION: Peribronchial thickening may indicate acute or chronic bronchitis. No focal consolidation. Cardiac enlargement. Electronically Signed   By: Lucienne Capers M.D.   On: 09/14/2016 00:19   Ct Head Wo Contrast  Result Date: 09/14/2016 CLINICAL DATA:  Confusion.  Altered mental status.  Weakness. EXAM: CT HEAD WITHOUT CONTRAST TECHNIQUE: Contiguous axial images were obtained from the base of the skull through the vertex without intravenous contrast. COMPARISON:  Head CT  and brain MRI 07/04/2016 FINDINGS: Brain: Stable degree of atrophy and chronic small vessel ischemia. No evidence of hemorrhage, acute infarct, hydrocephalus, mass effect or midline shift. Vascular: Atherosclerosis of skullbase vasculature without hyperdense vessel or abnormal calcification. Skull: No fracture or focal lesion. Sinuses/Orbits: Paranasal sinuses and mastoid air cells are clear. The visualized orbits are unremarkable. Other: None. IMPRESSION: No acute intracranial abnormality. Stable atrophy and chronic small vessel ischemia. Electronically Signed   By: Jeb Levering M.D.   On: 09/14/2016 00:11   Mr Brain Wo Contrast  Result Date: 09/14/2016 CLINICAL DATA:  76 year old female with sudden onset altered mental status since yesterday, weakness. EXAM: MRI HEAD WITHOUT CONTRAST TECHNIQUE: Multiplanar, multiecho pulse sequences of the brain and surrounding structures were obtained without intravenous contrast. COMPARISON:  Head CT without contrast 09/13/2016. Brain MRI 07/04/2016, 12/29/2015. FINDINGS: Brain: No restricted diffusion to suggest acute infarction. No midline shift, mass effect, evidence of mass lesion, ventriculomegaly, extra-axial collection or acute  intracranial hemorrhage. Cervicomedullary junction and pituitary are within normal limits. Stable gray and white matter signal throughout the brain. Patchy periventricular and other occasional scattered cerebral white matter T2 and FLAIR hyperintensity. No cortical encephalomalacia or chronic cerebral blood products identified. Deep gray matter nuclei, brainstem, and cerebellum remain normal for age. Vascular: Major intracranial vascular flow voids are stable. Skull and upper cervical spine: Negative. Visualized bone marrow signal is within normal limits. Sinuses/Orbits: Stable and negative. Other: Mastoid air cells remain clear. Visible internal auditory structures appear normal. Negative scalp soft tissues. IMPRESSION: 1.  No acute intracranial abnormality. 2. Stable noncontrast MRI appearance of the brain since 2017. Moderate for age cerebral white matter signal changes most commonly due to chronic small vessel disease. Electronically Signed   By: Genevie Ann M.D.   On: 09/14/2016 08:13        Scheduled Meds: . aspirin EC  81 mg Oral Daily  . cephALEXin  500 mg Oral Q12H  . enoxaparin (LOVENOX) injection  40 mg Subcutaneous Q24H  . insulin aspart  0-15 Units Subcutaneous TID WC  . insulin glargine  10 Units Subcutaneous Daily  . lactose free nutrition  237 mL Oral TID WC  . lactulose  20 g Oral TID  . metoprolol tartrate  25 mg Oral BID  . pantoprazole  40 mg Oral Daily  . sodium chloride flush  3 mL Intravenous Q12H   Continuous Infusions: . sodium chloride 100 mL/hr at 09/14/16 0538     LOS: 0 days    Time spent: 35 minutes.     Elmarie Shiley, MD [facility] Pager [phone]  If 7PM-7AM, please contact night-coverage [email or web address] Password [email or web address] 09/14/2016, 1:40 PM

## 2016-09-14 NOTE — Progress Notes (Signed)
PT Cancellation Note  Patient Details Name: [name] [name] MRN: [id number] DOB: [birth year]   Cancelled Treatment:    Reason Eval/Treat Not Completed: Other (comment) (pt feeling nauseated, just received meds, will check back as schedule permits.)   Jerricka Carvey,KATHrine E 09/14/2016, 11:35 AM Carmelia Bake, PT, DPT 09/14/2016 Pager: [phone]

## 2016-09-14 NOTE — Progress Notes (Signed)
Pt. refused CPAP set up/use for this evening, family member at bedside, RT to monitor.

## 2016-09-14 NOTE — H&P (Signed)
History and Physical  Patient Name: [name] [name]     BOF:[id number]    DOB: 1941/03/07    DOA: 09/13/2016 PCP: [name], MD   Patient coming from: Home  Chief Complaint: Confusion  HPI: [name] [name] is a 76 y.o. female with a past medical history significant for NASH cirrhosis, DM, HTN, mild dementia, and HCC recent chemoembolization who presents with acute confusion.  The patient recently underwent doxorubicin embedded beads to the liver mass, then thermoablation wtihout event.  In between these procedures she was admitted here with SIRS criteria and confusion, but cultures of blood and urine grew nothing, influenza A and B were negative, and antibiotics were stopped after 2 days and she improved.  Since her last ablation 2 weeks ago, her family feel she has been well.  She ambulates, has mild dementia (now off Aricept and Namenda), but is functioning well.  This morning, per family she was fine, they had no inkling of anything wrong, she had no fever, malaise, change in activity or function, complaints of dysuria or foul-urine, abdominal pain, cough or sputum production, and then around 4PM she was walking down the hall and stopped and had to hold the wall and said "I'm so weak", and ever since then she has seemed out of it.  Her husband tried to take her upstairs to go to bed a few hours later and when he told her to put her nightgown on, she said she'd never had a nightgown.  Finally, they thought she was too confused so they brought her to the ER.  No LOC, no focal weakness, no facial droop, no slurred speech.    ED course: -Afebrile, heart rate 80, respirations and pulse is normal, blood pressure 144/74 -Na 137, K 4.4, Cr 0.97, WBC 5.1K, Hgb 8.3 stable -Troponin negative -Urinalysis with trace WBCs -CT head normal -Chest x-ray showed bronchitis, no focal opacities -ECG unremarkable -She was given ceftriaxone for possible UTI and TRH was asked to evaluate for altered mental  status          ROS: Review of Systems  Constitutional: Positive for weight loss. Negative for chills, diaphoresis, fever and malaise/fatigue.  Eyes: Negative for blurred vision.  Respiratory: Negative for cough, sputum production and shortness of breath.   Cardiovascular: Negative for chest pain.  Gastrointestinal: Positive for constipation. Negative for abdominal pain, diarrhea, nausea and vomiting.  Genitourinary: Negative for dysuria, frequency, hematuria and urgency.  Musculoskeletal: Negative for back pain and myalgias.  Neurological: Positive for weakness. Negative for dizziness, tingling, tremors, sensory change, speech change, focal weakness, seizures, loss of consciousness and headaches.  Psychiatric/Behavioral: Positive for memory loss (or confusion).  All other systems reviewed and are negative.         Past Medical History:  Diagnosis Date  . Adenocarcinoma, breast [facility])    sees Dr. Marcy Panning   . Allergy    environmental  . Arthritis    sees Dr. Hurley Cisco   . Barrett esophagus 12/20/11  . Bladder cancer ([place])   . Breast cancer ([place])   . Cancer, hepatocellular ([place])   . CHF (congestive heart failure) ([place])   . Dementia    mild  . Diabetes mellitus    sees Dr. Chalmers Cater   . Diverticulosis of colon (without mention of hemorrhage) 09/26/2004,09/10/2006  . Gastritis   . GERD (gastroesophageal reflux disease)   . Hemorrhoids   . Hepatic cirrhosis ([place])   . Hepatic steatosis   . Hiatal hernia   .  Hyperglycemia   . Hyperlipidemia   . Hypertension   . IBS (irritable bowel syndrome)   . Inflammatory polyps of colon ([place])   . Iron deficiency anemia   . Malignant neoplasm of colon, unspecified site 07/08/2003  . Personal history of chemotherapy   . Personal history of radiation therapy   . Sleep apnea    sees Dr. Gwenette Greet   . Transaminase or LDH elevation    sees Dr. Zenovia Jarred     Past Surgical History:  Procedure Laterality Date  . bladder  cancer 2001    . BREAST BIOPSY    . BREAST SURGERY  2001   lymph nodes removed and rt mastectomy  . CARPAL TUNNEL RELEASE    . [place]   for diverticulitis  . COLONOSCOPY    . ESOPHAGEAL DILATION    . incisonal hernia    . IR GENERIC HISTORICAL  04/25/2016   IR RADIOLOGIST EVAL & MGMT 04/25/2016 [name], MD [facility] INTERV RAD  . IR GENERIC HISTORICAL  08/01/2016   IR 3D INDEPENDENT [facility] 08/01/2016 [facility]  . IR GENERIC HISTORICAL  08/01/2016   IR ANGIOGRAM VISCERAL SELECTIVE 08/01/2016 [facility]  . IR GENERIC HISTORICAL  08/01/2016   IR EMBO TUMOR ORGAN ISCHEMIA INFARCT INC GUIDE ROADMAPPING 08/01/2016 [facility]  . IR GENERIC HISTORICAL  08/01/2016   IR US GUIDE VASC ACCESS RIGHT 08/01/2016 [facility]  . IR GENERIC HISTORICAL  08/01/2016   IR ANGIOGRAM SELECTIVE EACH ADDITIONAL VESSEL 08/01/2016 [facility]  . IR GENERIC HISTORICAL  08/01/2016   IR ANGIOGRAM SELECTIVE EACH ADDITIONAL VESSEL 08/01/2016 [facility]  . MASTECTOMY    . mastectomy and biopsy    . RADIOFREQUENCY ABLATION N/A 09/01/2016   Procedure: MICROWAVE THERMAL ABLATION;  Surgeon: [name], MD;  Location: [facility];  Service: Anesthesiology;  Laterality: N/A;  . sigmoid colectomy 2005     for cancer  . squamous cell CA  R  [place]  2008      Social History: Patient lives with her husband.  The patient walks unassisted usually.  Remote former smoker.  Has mild dementia, followed by Dr. Delice Lesch, has done better since stopping namenda and Aricept per family.   No Known Allergies  Family history: family history includes Breast cancer in her sister and sister; Diabetes in her mother, sister, and sister; Esophageal cancer in her son; Hypertension in her sister, sister, and sister; Inflammatory bowel disease in her daughter; Lung cancer in her father; Lymphoma in her sister; Skin cancer in her mother.  Prior to Admission medications   Medication Sig Start Date End Date Taking? Authorizing  Provider  aspirin EC 81 MG tablet Take 81 mg by mouth daily.    Yes [provider]  Calcium Carbonate-Vitamin D (CALCIUM 600+D) 600-400 MG-UNIT tablet Take 1 tablet by mouth daily.   Yes [provider]  diphenhydrAMINE (BENADRYL) 25 MG tablet Take 25 mg by mouth at bedtime.   Yes [provider]  diphenoxylate-atropine (LOMOTIL) 2.5-0.025 MG tablet Take 2 tablets by mouth 4 (four) times daily as needed for diarrhea or loose stools. 06/15/16  Yes [name], [name], MD  docusate sodium (COLACE) 100 MG capsule Take 100 mg by mouth daily as needed for mild constipation.   Yes [provider]  insulin NPH Human (HUMULIN N,NOVOLIN N) 100 UNIT/ML injection Inject 20-25 Units into the skin 2 (two) times daily with a meal. 20 units in the morning and 25 units in the  evening   Yes [provider]  loratadine (CLARITIN) 10 MG tablet Take 10 mg by mouth daily.   Yes [provider]  meclizine (ANTIVERT) 25 MG tablet Take 1 tablet (25 mg total) by mouth every 4 (four) hours as needed for dizziness. 05/13/14  Yes [name], MD  metoprolol tartrate (LOPRESSOR) 25 MG tablet TAKE 1 TABLET TWICE DAILY 04/24/16  Yes Lorretta Harp, MD  Omega-3 Fatty Acids (FISH OIL PO) Take 1 capsule by mouth every evening.    Yes [provider]  omeprazole (PRILOSEC) 20 MG capsule TAKE 1 CAPSULE TWICE DAILY BEFORE MEALS 06/22/16  Yes [name], [name], MD  ondansetron (ZOFRAN-ODT) 4 MG disintegrating tablet DISSOLVE ONE TABLET IN MOUTH EVERY 8 HOURS AS NEEDED FOR NAUSEA OR VOMITING Patient taking differently: Take 4 mg by mouth every 8 (eight) hours as needed for nausea or vomiting.  06/15/16  Yes [name], [name], MD  potassium chloride SA (K-DUR,KLOR-CON) 20 MEQ tablet Take 1 tablet (20 mEq total) by mouth daily. OVERDUE FOR FOLLOW UP. PLEASE CALL AND SCHEDULE [phone] 08/07/16  Yes Lorretta Harp, MD  rosuvastatin (CRESTOR) 10 MG tablet TAKE 1/2 TABLET EVERY DAY Patient  taking differently: TAKE 5 MG EVERY EVENING 10/15/15  Yes [name], MD  traMADol (ULTRAM) 50 MG tablet Take 1 tablet (50 mg total) by mouth every 6 (six) hours as needed. Patient taking differently: Take 50 mg by mouth every 6 (six) hours as needed for moderate pain.  03/11/15  Yes [name], MD       Physical Exam: BP 137/64 (BP Location: Left Arm)   Pulse 98   Temp 98.7 F (37.1 C) (Oral)   Resp 16   Ht 5\' 5"  (1.651 m)   Wt 70.3 kg (155 lb)   SpO2 95%   BMI 25.79 kg/m  General appearance: Overweight, elderly adult female, awake but out of it, in no obvious distress.   Eyes: Anicteric, conjunctiva pink, lids and lashes normal. Pupils equal, reactive. ENT: No nasal deformity, discharge, epistaxis.  Hearing normal. OP dry without lesions.   Neck: No neck masses.  Trachea midline.  No thyromegaly/tenderness. Lymph: No cervical or supraclavicular lymphadenopathy. Skin: Warm and dry.  No jaundice.  No suspicious rashes or lesions. Cardiac: Slightly tachycardic now, nl S1-S2, no murmurs appreciated.  Capillary refill is brisk.  JVP not visible.  No LE edema.  Radial and DP pulses 2+ and symmetric. Respiratory: Normal respiratory rate and rhythm.  CTAB without rales or wheezes. Abdomen: Abdomen soft.  No TTP. No ascites, distension, hepatosplenomegaly.   MSK: No deformities or effusions.  No cyanosis or clubbing. Neuro: Cranial nerves 3-12 intact, I detect no deficit in visual fields.  Sensation intact to light touch. Speech is fluent.  Muscle strength normal and 5/5 in upper and lower extremities.  FTN normal.    Psych: Sensorium intact and responding to quesitons, seems delayed and searching for words, like "hospital", but at other times seems able to name "bottle", "pen", "elbow".  Attention diminshed.   Affect flat.  Judgment and insight appear impaired.     Labs on Admission:  I have personally reviewed following labs and imaging studies: CBC:  Recent Labs Lab  09/14/16 0021 09/14/16 0044  WBC 5.1  --   NEUTROABS 2.9  --   HGB 8.3* 8.5*  HCT 26.2* 25.0*  MCV 89.1  --   PLT 212  --    Basic Metabolic Panel:  Recent Labs  Lab 09/14/16 0021 09/14/16 0044  NA 137 139  K 4.4 4.3  CL 108 108  CO2 21*  --   GLUCOSE 168* 167*  BUN 17 17  CREATININE 0.97 0.90  CALCIUM 9.3  --    GFR: Estimated Creatinine Clearance: 53.1 mL/min (by C-G formula based on SCr of 0.9 mg/dL).  Liver Function Tests:  Recent Labs Lab 09/14/16 0021  AST 49*  ALT 23  ALKPHOS 144*  BILITOT 1.0  PROT 7.5  ALBUMIN 3.2*   No results for input(s): LIPASE, AMYLASE in the last 168 hours. No results for input(s): AMMONIA in the last 168 hours. Coagulation Profile: No results for input(s): INR, PROTIME in the last 168 hours. Cardiac Enzymes: No results for input(s): CKTOTAL, CKMB, CKMBINDEX, TROPONINI in the last 168 hours. BNP (last 3 results) No results for input(s): PROBNP in the last 8760 hours. HbA1C: No results for input(s): HGBA1C in the last 72 hours. CBG:  Recent Labs Lab 09/14/16 0019  GLUCAP 163*   Lipid Profile: No results for input(s): CHOL, HDL, LDLCALC, TRIG, CHOLHDL, LDLDIRECT in the last 72 hours. Thyroid Function Tests: No results for input(s): TSH, T4TOTAL, FREET4, T3FREE, THYROIDAB in the last 72 hours. Anemia Panel: No results for input(s): VITAMINB12, FOLATE, FERRITIN, TIBC, IRON, RETICCTPCT in the last 72 hours. Sepsis Labs: Invalid input(s): PROCALCITONIN, LACTICIDVEN No results found for this or any previous visit (from the past 240 hour(s)).       Radiological Exams on Admission: Personally reviewed CXR shows no focal opacity, ?bronchitis; CT head report reviewed: Dg Chest 2 View  Result Date: 09/14/2016 CLINICAL DATA:  Altered mental status.  Nausea. EXAM: CHEST  2 VIEW COMPARISON:  08/19/2016 FINDINGS: Shallow inspiration. Cardiac enlargement without vascular congestion. Calcification in the mitral valve annulus.  Peribronchial thickening may indicate acute or chronic bronchitis. No focal consolidation. No blunting of costophrenic angles. No pneumothorax. Surgical clips in the right axilla. Degenerative changes in the spine. IMPRESSION: Peribronchial thickening may indicate acute or chronic bronchitis. No focal consolidation. Cardiac enlargement. Electronically Signed   By: Lucienne Capers M.D.   On: 09/14/2016 00:19   Ct Head Wo Contrast  Result Date: 09/14/2016 CLINICAL DATA:  Confusion.  Altered mental status.  Weakness. EXAM: CT HEAD WITHOUT CONTRAST TECHNIQUE: Contiguous axial images were obtained from the base of the skull through the vertex without intravenous contrast. COMPARISON:  Head CT and brain MRI 07/04/2016 FINDINGS: Brain: Stable degree of atrophy and chronic small vessel ischemia. No evidence of hemorrhage, acute infarct, hydrocephalus, mass effect or midline shift. Vascular: Atherosclerosis of skullbase vasculature without hyperdense vessel or abnormal calcification. Skull: No fracture or focal lesion. Sinuses/Orbits: Paranasal sinuses and mastoid air cells are clear. The visualized orbits are unremarkable. Other: None. IMPRESSION: No acute intracranial abnormality. Stable atrophy and chronic small vessel ischemia. Electronically Signed   By: Jeb Levering M.D.   On: 09/14/2016 00:11    EKG: Independently reviewed. Rate 78, QTc 482, NSR.         Assessment/Plan  1. Altered mental status:  Seems very acute in onset per family. Alternatively it may be that this had been slowly developing over a few days, but she was compensating until this afternoon.  If former, suspect stroke, if latter, may be multifactorial.  No previous HE, but this is considered, also dehydration, UTI in setting of dementia. -MR brain is ordered -Check ammonia, TSH -IV fluids -Follow urine culture -Oral cephalexin only for now -Hold PRNs tramadol, meclzine, diphenhydramine for now  2. Acidosis:  Mild,  non-gap.  Unclear cause.  3. Hypertension:  -Continue metoprolol -Continue statin, aspirin  4. Type 2 diabetes:  -Hold metformin, NPH while in hospital -While in hospital, glargine 20 daily -SSI with meals  5. Other medications:  -Continue PPI, docusate           DVT prophylaxis: Lovenox  Code Status: FULL  Family Communication: Husband and daguhter at bedside.  Overnight plan discussed, differential discussed.    Disposition Plan: Anticipate MR brain, IV fluids and follow lab tests. Consults called: None Admission status: OBS At the point of initial evaluation, it is my clinical opinion that admission for OBSERVATION is reasonable and necessary because the patient's presenting complaints in the context of their chronic conditions represent sufficient risk of deterioration or significant morbidity to constitute reasonable grounds for close observation in the hospital setting, but that the patient may be medically stable for discharge from the hospital within 24 to 48 hours.    Medical decision making: Patient seen at 4:20 AM on 09/14/2016.  The patient was discussed with Dr. Randal Buba.  What exists of the patient's chart was reviewed in depth and summarized above.  Clinical condition: stable.        Edwin Dada [facility] Pager [phone]

## 2016-09-15 DIAGNOSIS — R509 Fever, unspecified: Secondary | ICD-10-CM

## 2016-09-15 DIAGNOSIS — K7290 Hepatic failure, unspecified without coma: Principal | ICD-10-CM

## 2016-09-15 DIAGNOSIS — R410 Disorientation, unspecified: Secondary | ICD-10-CM

## 2016-09-15 DIAGNOSIS — D649 Anemia, unspecified: Secondary | ICD-10-CM

## 2016-09-15 DIAGNOSIS — F0390 Unspecified dementia without behavioral disturbance: Secondary | ICD-10-CM

## 2016-09-15 DIAGNOSIS — E119 Type 2 diabetes mellitus without complications: Secondary | ICD-10-CM

## 2016-09-15 DIAGNOSIS — C220 Liver cell carcinoma: Secondary | ICD-10-CM

## 2016-09-15 LAB — URINE CULTURE

## 2016-09-15 LAB — IRON AND TIBC
IRON: 66 ug/dL (ref 28–170)
Saturation Ratios: 18 % (ref 10.4–31.8)
TIBC: 365 ug/dL (ref 250–450)
UIBC: 299 ug/dL

## 2016-09-15 LAB — VITAMIN B12: VITAMIN B 12: 717 pg/mL (ref 180–914)

## 2016-09-15 LAB — CBC
HCT: 26.1 % — ABNORMAL LOW (ref 36.0–46.0)
HEMOGLOBIN: 8.2 g/dL — AB (ref 12.0–15.0)
MCH: 28.4 pg (ref 26.0–34.0)
MCHC: 31.4 g/dL (ref 30.0–36.0)
MCV: 90.3 fL (ref 78.0–100.0)
Platelets: 179 10*3/uL (ref 150–400)
RBC: 2.89 MIL/uL — AB (ref 3.87–5.11)
RDW: 18.7 % — ABNORMAL HIGH (ref 11.5–15.5)
WBC: 4 10*3/uL (ref 4.0–10.5)

## 2016-09-15 LAB — COMPREHENSIVE METABOLIC PANEL
ALK PHOS: 113 U/L (ref 38–126)
ALT: 19 U/L (ref 14–54)
ANION GAP: 8 (ref 5–15)
AST: 39 U/L (ref 15–41)
Albumin: 2.7 g/dL — ABNORMAL LOW (ref 3.5–5.0)
BUN: 14 mg/dL (ref 6–20)
CALCIUM: 8.7 mg/dL — AB (ref 8.9–10.3)
CO2: 21 mmol/L — AB (ref 22–32)
Chloride: 111 mmol/L (ref 101–111)
Creatinine, Ser: 1.06 mg/dL — ABNORMAL HIGH (ref 0.44–1.00)
GFR calc non Af Amer: 50 mL/min — ABNORMAL LOW (ref >60)
GFR, EST AFRICAN AMERICAN: 58 mL/min — AB (ref >60)
Glucose, Bld: 156 mg/dL — ABNORMAL HIGH (ref 65–99)
POTASSIUM: 3.5 mmol/L (ref 3.5–5.1)
SODIUM: 140 mmol/L (ref 135–145)
TOTAL PROTEIN: 6.4 g/dL — AB (ref 6.5–8.1)
Total Bilirubin: 1 mg/dL (ref 0.3–1.2)

## 2016-09-15 LAB — RETICULOCYTES
RBC.: 2.89 MIL/uL — ABNORMAL LOW (ref 3.87–5.11)
Retic Count, Absolute: 80.9 10*3/uL (ref 19.0–186.0)
Retic Ct Pct: 2.8 % (ref 0.4–3.1)

## 2016-09-15 LAB — FERRITIN: Ferritin: 35 ng/mL (ref 11–307)

## 2016-09-15 LAB — GLUCOSE, CAPILLARY
Glucose-Capillary: 134 mg/dL — ABNORMAL HIGH (ref 65–99)
Glucose-Capillary: 186 mg/dL — ABNORMAL HIGH (ref 65–99)

## 2016-09-15 LAB — FOLATE: Folate: 11.2 ng/mL (ref >5.9)

## 2016-09-15 LAB — AMMONIA: AMMONIA: 21 umol/L (ref 9–35)

## 2016-09-15 MED ORDER — LACTULOSE 10 GM/15ML PO SOLN: 20.0000 g | Freq: Three times a day (TID) | ORAL | 0 refills | Status: DC

## 2016-09-15 MED ORDER — INSULIN NPH (HUMAN) (ISOPHANE) 100 UNIT/ML ~~LOC~~ SUSP: 8.0000 [IU] | Freq: Two times a day (BID) | SUBCUTANEOUS | 0 refills | Status: DC

## 2016-09-15 MED ORDER — FERROUS SULFATE 325 (65 FE) MG PO TABS: 325.0000 mg | ORAL_TABLET | Freq: Two times a day (BID) | ORAL | Status: DC

## 2016-09-15 MED ORDER — CEPHALEXIN 500 MG PO CAPS: 500.0000 mg | ORAL_CAPSULE | Freq: Two times a day (BID) | ORAL | 0 refills | Status: DC

## 2016-09-15 MED ORDER — FERROUS SULFATE 325 (65 FE) MG PO TABS: 325.0000 mg | ORAL_TABLET | Freq: Two times a day (BID) | ORAL | 3 refills | Status: DC

## 2016-09-15 NOTE — Progress Notes (Signed)
IP PROGRESS NOTE  Subjective:   [name] [name] is known to me with a history of hepatocellular carcinoma. She was admitted with confusion on 09/14/2016. She is being treated for hepatic encephalopathy. She has dementia. [name] [name] has severe anemia. She and the family deny bleeding other than nosebleeds. Her appetite is poor. She has intermittent low-grade fever in the knee evening and night sweats.  Objective: Vital signs in last 24 hours: Blood pressure 122/88, pulse 82, temperature 99.3 F (37.4 C), temperature source Oral, resp. rate 18, height 5\' 5"  (1.651 m), weight 155 lb (70.3 kg), SpO2 99 %.  Intake/Output from previous day: 05/10 0701 - 05/11 0700 In: 2461.8 [P.O.:660; I.V.:1801.8] Out: -   Physical Exam:  HEENT: Neck without mass Lungs: Clear bilaterally Cardiac: Regular rate and rhythm Abdomen: No hepatosplenomegaly, no apparent ascites Extremities: No leg edema Lymph nodes: No cervical, supraclavicular, axillary, or inguinal nodes Neurologic: Alert, follows commands, not oriented to date  Lab Results:  Recent Labs  09/14/16 0021 09/14/16 0044 09/15/16 0619  WBC 5.1  --  4.0  HGB 8.3* 8.5* 8.2*  HCT 26.2* 25.0* 26.1*  PLT 212  --  179  Blood smear 09/14/2016-the platelets appear normal in number, the white cell morphology is unremarkable. There are numerous ovalocytes and burr cells. A few teardrops. The polychromasia appears mildly increased.  BMET  Recent Labs  09/14/16 0021 09/14/16 0044 09/15/16 0619  NA 137 139 140  K 4.4 4.3 3.5  CL 108 108 111  CO2 21*  --  21*  GLUCOSE 168* 167* 156*  BUN 17 17 14   CREATININE 0.97 0.90 1.06*  CALCIUM 9.3  --  8.7*  Ferritin-35, iron 66, TIBC 299, percent saturation 18  Studies/Results: Dg Chest 2 View  Result Date: 09/14/2016 CLINICAL DATA:  Altered mental status.  Nausea. EXAM: CHEST  2 VIEW COMPARISON:  08/19/2016 FINDINGS: Shallow inspiration. Cardiac enlargement without vascular congestion. Calcification in  the mitral valve annulus. Peribronchial thickening may indicate acute or chronic bronchitis. No focal consolidation. No blunting of costophrenic angles. No pneumothorax. Surgical clips in the right axilla. Degenerative changes in the spine. IMPRESSION: Peribronchial thickening may indicate acute or chronic bronchitis. No focal consolidation. Cardiac enlargement. Electronically Signed   By: Lucienne Capers M.D.   On: 09/14/2016 00:19   Ct Head Wo Contrast  Result Date: 09/14/2016 CLINICAL DATA:  Confusion.  Altered mental status.  Weakness. EXAM: CT HEAD WITHOUT CONTRAST TECHNIQUE: Contiguous axial images were obtained from the base of the skull through the vertex without intravenous contrast. COMPARISON:  Head CT and brain MRI 07/04/2016 FINDINGS: Brain: Stable degree of atrophy and chronic small vessel ischemia. No evidence of hemorrhage, acute infarct, hydrocephalus, mass effect or midline shift. Vascular: Atherosclerosis of skullbase vasculature without hyperdense vessel or abnormal calcification. Skull: No fracture or focal lesion. Sinuses/Orbits: Paranasal sinuses and mastoid air cells are clear. The visualized orbits are unremarkable. Other: None. IMPRESSION: No acute intracranial abnormality. Stable atrophy and chronic small vessel ischemia. Electronically Signed   By: Jeb Levering M.D.   On: 09/14/2016 00:11   Mr Brain Wo Contrast  Result Date: 09/14/2016 CLINICAL DATA:  76 year old female with sudden onset altered mental status since yesterday, weakness. EXAM: MRI HEAD WITHOUT CONTRAST TECHNIQUE: Multiplanar, multiecho pulse sequences of the brain and surrounding structures were obtained without intravenous contrast. COMPARISON:  Head CT without contrast 09/13/2016. Brain MRI 07/04/2016, 12/29/2015. FINDINGS: Brain: No restricted diffusion to suggest acute infarction. No midline shift, mass effect, evidence of mass lesion,  ventriculomegaly, extra-axial collection or acute intracranial  hemorrhage. Cervicomedullary junction and pituitary are within normal limits. Stable gray and white matter signal throughout the brain. Patchy periventricular and other occasional scattered cerebral white matter T2 and FLAIR hyperintensity. No cortical encephalomalacia or chronic cerebral blood products identified. Deep gray matter nuclei, brainstem, and cerebellum remain normal for age. Vascular: Major intracranial vascular flow voids are stable. Skull and upper cervical spine: Negative. Visualized bone marrow signal is within normal limits. Sinuses/Orbits: Stable and negative. Other: Mastoid air cells remain clear. Visible internal auditory structures appear normal. Negative scalp soft tissues. IMPRESSION: 1.  No acute intracranial abnormality. 2. Stable noncontrast MRI appearance of the brain since 2017. Moderate for age cerebral white matter signal changes most commonly due to chronic small vessel disease. Electronically Signed   By: Genevie Ann M.D.   On: 09/14/2016 08:13    Medications: I have reviewed the patient's current medications.  Assessment/Plan: 1. NASH related cirrhosis  2. MRI liver 07/05/2015 with 3 hypervascular lesions concerning for multifocal hepatocellular carcinoma  Ultrasound-guided biopsy of a right liver lesion on 07/19/2015-No [facility] or metastatic carcinoma  MRI liver 04/10/2016-enlargement of right hepatic lobe lesion with characteristics of HCC, 1.3 cm segment 6 lesion, resolution of a third lesion near the gallbladder fossa  Chemotherapy embolization of segment 6 [facility] 08/01/2016  Ablation of segment 6 [place] 09/01/2016  3. Memory loss secondary to dementia versus hepatic encephalopathy  4. History of stage III right-sided breast cancer diagnosed in 2001  5. 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 Diabetes  7. Stage II (T3 N0), right colon, 2005  8. Removal of a benign greater curvature polyp 07/15/2015 9.  Anemia -iron  deficiency versus anemia of chronic disease versus myelodysplasia  [name] [name] is admitted with confusion. The confusion is likely related to dementia and hepatic encephalopathy.  She has severe anemia. The anemia may be related to "chronic disease ", iron deficiency, or myelodysplasia. There may also be a component from the recent procedures.  The peripheral blood smear has changes consistent with iron deficiency. She could have chronic GI blood loss related to cirrhosis and portal hypertension.  Recommendations: 1. Check stool Hemoccult 2. Begin trial of ferrous sulfate 3. Outpatient GI evaluation 4. Follow-up CBC with primary physician within the next few weeks 5. Contact me if the hemoglobin does not correct with iron and I will see her sooner than the scheduled follow-up in September    LOS: 1 day   Betsy Coder, MD   09/15/2016, 11:30 AM

## 2016-09-15 NOTE — Care Management Note (Signed)
Case Management Note  Patient Details  Name: [name] [name] MRN: [id number] Date of Birth: 21-Jan-1941  Subjective/Objective:  PT recc HHPT. Spoke to spouse in rm(patient sleeping)-they used [facility] in past,& wanted to use again-HHRN/HHPT ordered. [facility] rep notified-aware of d/c& HHC orders. No further CM needs.                  Action/Plan:d/c home w/HHC.   Expected Discharge Date:  09/15/16               Expected Discharge Plan:  [place]  In-House Referral:     Discharge planning Services  CM Consult  Post Acute Care Choice:  Durable Medical Equipment (rw) Choice offered to:  Spouse  DME Arranged:    DME Agency:     HH Arranged:  RN, PT [place] Agency:  [place]  Status of Service:  Completed, signed off  If discussed at [place] of Stay Meetings, dates discussed:    Additional Comments:  Dessa Phi, RN 09/15/2016, 11:35 AM

## 2016-09-15 NOTE — Progress Notes (Signed)
Pts IV removed and clean dressing in place. Pt denies pain at this time. Pt escorted to front entrance via wheelchair by nursing staff with family present.

## 2016-09-15 NOTE — Discharge Summary (Signed)
Physician Discharge Summary  [name] [name]:[id number] DOB: [birth year] DOA: 09/13/2016  PCP: [name] Morale, MD  Admit date: 09/13/2016 Discharge date: 09/15/2016  Admitted From: Home  Disposition:  Home   Recommendations for Outpatient Follow-up:  1. Follow up with PCP in 1-2 weeks 2. Please obtain BMP/CBC in one week 3. Needs follow up with GI, for further evaluation of iron deficiency anemia.   Home Health: yes.   Discharge Condition: Stable.  CODE STATUS: Full code.  Diet recommendation: Heart Healthy  Brief/Interim Summary: [name] [name] Kingis a 76 y.o.femalewith a past medical history significant for NASH cirrhosis, DM, HTN, mild dementia, and [name] [name] recent chemoembolizationwho presents with acute confusion.  The patient recently underwent doxorubicin embedded beads to the liver mass, then thermoablation wtihout event. In between these procedures [name] [name] was admitted here with SIRS criteria and confusion, but cultures of blood and urine grew nothing, influenza A and B were negative, and antibiotics were stopped after 2 days and [name] [name] improved.  Since her last ablation 2 weeks ago, her family feel [name] [name] has been well. [name] [name] ambulates, has mild dementia (now off Aricept and Namenda), but is functioning well. This morning, per family [name] [name] was fine, they had no inkling of anything wrong, [name] [name] had no fever, malaise, change in activity or function, complaints of dysuria or foul-urine, abdominal pain, cough or sputum production, and then around 4PM [name] [name] was walking down the hall and stopped and had to hold the wall and said "I'm so weak", and ever since then [name] [name] has seemed out of it. Her husband tried to take her upstairs to go to bed a few hours later and when he told her to put her nightgown on, [name] [name] said [name] [name]'d never had a nightgown. Finally, they thought [name] [name] was too confused so they brought her to the ER. No LOC, no focal weakness, no facial droop, no slurred speech.   Assessment &  Plan: 1-Acute encephalopathy, related to hepatic encephalopathy, component of infection, and dementia Ammonia elevated at 60. Started lactulose. Titrate for 3 BM a day. Ammonia decreased to 20.  Treating  for UTI also. Discharge on keflex for 4 days treatment.  PT evaluation.  MRI negative for stroke or mets.  Bili normal, LFT stable.  Mental status improved.   2-DM;resume Metformin.  On gargline.   3-HTN; continue with metoprolol.  HLD; discontinue statin due to liver failure.   4-HCC, cirrhosis, [place].  Recent embolization , 2 weeks ago. bilirubin normal.  Started lactulose. Advised family lactulose can be adjusted for patient to have 3 BM per day.  Discontinue statins.  Appreciate [name] [name] help/  [name] [name] informed of patient admission.  Anemia; possible iron deficiency;  hb stable.  Start ferrous sulfate.  Need occult blood.  Needs follow up with GI   Discharge Diagnoses:  Principal Problem:   Altered mental status Active Problems:   Diabetes mellitus type 2 in obese [name])   Essential hypertension   OSA (obstructive sleep apnea)- C-pap   Anemia, unspecified   Mild dementia   Hepatocellular carcinoma [name])    Discharge Instructions  Discharge Instructions    Diet - low sodium heart healthy    Complete by:  As directed    Increase activity slowly    Complete by:  As directed      Allergies as of 09/15/2016   No Known Allergies     Medication List    STOP taking these medications   diphenoxylate-atropine 2.5-0.025 MG tablet Commonly known as:  LOMOTIL   potassium chloride SA 20 MEQ tablet Commonly known as:  K-DUR,KLOR-CON   rosuvastatin 10 MG tablet Commonly known as:  CRESTOR     TAKE these medications   aspirin EC 81 MG tablet Take 81 mg by mouth daily.   CALCIUM 600+D 600-400 MG-UNIT tablet Generic drug:  Calcium Carbonate-Vitamin D Take 1 tablet by mouth daily.   cephALEXin 500 MG capsule Commonly known as:  KEFLEX Take 1  capsule (500 mg total) by mouth every 12 (twelve) hours.   diphenhydrAMINE 25 MG tablet Commonly known as:  BENADRYL Take 25 mg by mouth at bedtime.   docusate sodium 100 MG capsule Commonly known as:  COLACE Take 100 mg by mouth daily as needed for mild constipation.   ferrous sulfate 325 (65 FE) MG tablet Take 1 tablet (325 mg total) by mouth 2 (two) times daily with a meal.   FISH OIL PO Take 1 capsule by mouth every evening.   insulin NPH Human 100 UNIT/ML injection Commonly known as:  HUMULIN N,NOVOLIN N Inject 0.08 mLs (8 Units total) into the skin 2 (two) times daily with a meal. 8 units in the morning and 8 units in the evening What changed:  how much to take  additional instructions   lactulose 10 GM/15ML solution Commonly known as:  CHRONULAC Take 30 mLs (20 g total) by mouth 3 (three) times daily.   loratadine 10 MG tablet Commonly known as:  CLARITIN Take 10 mg by mouth daily.   meclizine 25 MG tablet Commonly known as:  ANTIVERT Take 1 tablet (25 mg total) by mouth every 4 (four) hours as needed for dizziness.   metoprolol tartrate 25 MG tablet Commonly known as:  LOPRESSOR TAKE 1 TABLET TWICE DAILY   omeprazole 20 MG capsule Commonly known as:  PRILOSEC TAKE 1 CAPSULE TWICE DAILY BEFORE MEALS   ondansetron 4 MG disintegrating tablet Commonly known as:  ZOFRAN-ODT DISSOLVE ONE TABLET IN MOUTH EVERY 8 HOURS AS NEEDED FOR NAUSEA OR VOMITING What changed:  how much to take  how to take this  when to take this  reasons to take this  additional instructions   traMADol 50 MG tablet Commonly known as:  ULTRAM Take 1 tablet (50 mg total) by mouth every 6 (six) hours as needed. What changed:  reasons to take this       No Known Allergies  Consultations:  [name] [name].    Procedures/Studies: Dg Chest 2 View  Result Date: 09/14/2016 CLINICAL DATA:  Altered mental status.  Nausea. EXAM: CHEST  2 VIEW COMPARISON:  08/19/2016 FINDINGS:  Shallow inspiration. Cardiac enlargement without vascular congestion. Calcification in the mitral valve annulus. Peribronchial thickening may indicate acute or chronic bronchitis. No focal consolidation. No blunting of costophrenic angles. No pneumothorax. Surgical clips in the right axilla. Degenerative changes in the spine. IMPRESSION: Peribronchial thickening may indicate acute or chronic bronchitis. No focal consolidation. Cardiac enlargement. Electronically Signed   By: Lucienne Capers M.D.   On: 09/14/2016 00:19   Dg Chest 2 View  Result Date: 08/19/2016 CLINICAL DATA:  76 year old female with fever and confusion. Initial encounter. 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 EXAM: CHEST  2 VIEW COMPARISON:  Chest radiographs 07/07/2013 FINDINGS: Semi upright AP and lateral views of the chest. One of the patient arms is not raised on the lateral view. Mildly lower lung volumes. Stable cardiac size and mediastinal contours. Visualized tracheal air column is within normal limits. No pneumothorax.  Pulmonary vascularity appears stable without overt edema. No pleural effusion or confluent opacity. Chronic increased pulmonary interstitial markings are stable. Postoperative changes at the right axilla are chronic. No acute osseous abnormality identified. Calcified aortic atherosclerosis. Negative visible bowel gas pattern. IMPRESSION: 1.  No acute cardiopulmonary abnormality. 2.  Calcified aortic atherosclerosis. Electronically Signed   By: Genevie Ann M.D.   On: 08/19/2016 15:30   Ct Head Wo Contrast  Result Date: 09/14/2016 CLINICAL DATA:  Confusion.  Altered mental status.  Weakness. EXAM: CT HEAD WITHOUT CONTRAST TECHNIQUE: Contiguous axial images were obtained from the base of the skull through the vertex without intravenous contrast. COMPARISON:  Head CT and brain MRI 07/04/2016 FINDINGS: Brain: Stable degree of atrophy and chronic small vessel ischemia.  No evidence of hemorrhage, acute infarct, hydrocephalus, mass effect or midline shift. Vascular: Atherosclerosis of skullbase vasculature without hyperdense vessel or abnormal calcification. Skull: No fracture or focal lesion. Sinuses/Orbits: Paranasal sinuses and mastoid air cells are clear. The visualized orbits are unremarkable. Other: None. IMPRESSION: No acute intracranial abnormality. Stable atrophy and chronic small vessel ischemia. Electronically Signed   By: Jeb Levering M.D.   On: 09/14/2016 00:11   Mr Brain Wo Contrast  Result Date: 09/14/2016 CLINICAL DATA:  76 year old female with sudden onset altered mental status since yesterday, weakness. EXAM: MRI HEAD WITHOUT CONTRAST TECHNIQUE: Multiplanar, multiecho pulse sequences of the brain and surrounding structures were obtained without intravenous contrast. COMPARISON:  Head CT without contrast 09/13/2016. Brain MRI 07/04/2016, 12/29/2015. FINDINGS: Brain: No restricted diffusion to suggest acute infarction. No midline shift, mass effect, evidence of mass lesion, ventriculomegaly, extra-axial collection or acute intracranial hemorrhage. Cervicomedullary junction and pituitary are within normal limits. Stable gray and white matter signal throughout the brain. Patchy periventricular and other occasional scattered cerebral white matter T2 and FLAIR hyperintensity. No cortical encephalomalacia or chronic cerebral blood products identified. Deep gray matter nuclei, brainstem, and cerebellum remain normal for age. Vascular: Major intracranial vascular flow voids are stable. Skull and upper cervical spine: Negative. Visualized bone marrow signal is within normal limits. Sinuses/Orbits: Stable and negative. Other: Mastoid air cells remain clear. Visible internal auditory structures appear normal. Negative scalp soft tissues. IMPRESSION: 1.  No acute intracranial abnormality. 2. Stable noncontrast MRI appearance of the brain since 2017. Moderate for age  cerebral white matter signal changes most commonly due to chronic small vessel disease. Electronically Signed   By: Genevie Ann M.D.   On: 09/14/2016 08:13   Ct Guide Tissue Ablation  Result Date: 09/01/2016 INDICATION: 76 year old female with NASH cirrhosis and hepatocellular carcinoma. [name] [name] underwent drug-eluting bead chemoembolization on 08/01/2016 and presents today for definitive percutaneous thermal ablation. EXAM: CT-guided thermal ablation of liver lesion COMPARISON:  CT abdomen and pelvis 07/21/2016 MEDICATIONS: Ancef 2 gm IV; The antibiotic was administered in an appropriate time interval prior to needle puncture of the skin. ANESTHESIA/SEDATION: General - as administered by the Anesthesia department FLUOROSCOPY TIME:  Fluoroscopy Time: 0 minutes 0 seconds (0 mGy). COMPLICATIONS: None immediate. TECHNIQUE: Informed written consent was obtained from the patient after a thorough discussion of the procedural risks, benefits and alternatives. All questions were addressed. Maximal Sterile Barrier Technique was utilized including caps, mask, sterile gowns, sterile gloves, sterile drape, hand hygiene and skin antiseptic. A timeout was performed prior to the initiation of the procedure. A planning axial CT scan was performed. There is persistent staining within the hepatic segment 6 branch arteries as well as a small amounts of staining within the embolized tumor. Suitable  skin entry sites were selected and marked. Local anesthesia was attained by infiltration with 1% lidocaine after sterile prep and drape with chlorhexidine skin prep. Under intermittent CT guidance, two 15 cm PR probes were carefully advanced in used to bracket the lesion. Percutaneous thermal ablation was then performed with both probes powered at 65 watts for 5 minutes. Following ablation, the tissue tracts were ablated in the probes were removed. A post ablation CT scan of the chest with intravenous contrast was not performed. The ablation zone  completely encompasses the mass. No evidence of immediate complication. Unfortunately, there is a new 1.5 cm enhancing nodule just anterior to the most anterior and medial aspect of hepatic segment 6 immediately adjacent to the main portal vein. There is a smaller enhancing nodule measuring 0.9 cm in hepatic segment 2 just inferior to the heart. 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 This is new compared to prior imaging from March of 2018. This is not appear to be secondary to the ablation procedure itself as there is evidence of high attenuation material within the occluded portal vein on the initial planning CT images prior to ablation. FINDINGS: 1. Successful ablation of hepatic segment 6 hepatocellular carcinoma with complete coverage of the lesion. 2. 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 3. New 1.5 cm enhancing nodule versus porta hepatis lymph node adjacent to the main portal vein. 4. Enhancing 0.9 cm nodule in the superior aspect of hepatic segment 6. IMPRESSION: Technically successful percutaneous thermal ablation of hepatic segment 6 hepatocellular carcinoma. PLAN: 1. Admitted for observation overnight. Anticipate discharge in the morning. 2. Follow-up clinic visit in 4- 6 weeks with repeat abdominal MRI. I am concerned about new sites of multifocal disease. Signed, Criselda Peaches, MD [facility] Radiology Electronically Signed   By: [name] M.D.   On: 09/01/2016 13:20       Subjective: [name] [name] is alert, oriented to person and place. Recognized family.  [name] [name] was able to tell me what [name] [name] had for breakfast.   Discharge Exam: Vitals:   09/14/16 2126 09/15/16 0503  BP: 138/71 122/88  Pulse: 86 82  Resp: 18 18  Temp: 99.5 F (37.5 C) 99.3 F (37.4 C)   Vitals:   09/14/16 0511 09/14/16 1509 09/14/16 2126 09/15/16 0503  BP: 137/64 127/68 138/71 122/88  Pulse: 98 82 86 82  Resp:  20 18 18   Temp: 98.7 F (37.1  C) 98.5 F (36.9 C) 99.5 F (37.5 C) 99.3 F (37.4 C)  TempSrc: Oral Oral Oral Oral  SpO2: 95% 99% 98% 99%  Weight: 70.3 kg (155 lb)     Height: 5\' 5"  (1.651 m)       General: Pt is alert, awake, not in acute distress Cardiovascular: RRR, S1/S2 +, no rubs, no gallops Respiratory: CTA bilaterally, no wheezing, no rhonchi Abdominal: Soft, NT, ND, bowel sounds + Extremities: no edema, no cyanosis    The results of significant diagnostics from this hospitalization (including imaging, microbiology, ancillary and laboratory) are listed below for reference.     Microbiology: Recent Results (from the past 240 hour(s))  Urine culture     Status: Abnormal   Collection Time: 09/14/16 12:42 AM  Result Value Ref Range Status   Specimen Description URINE, CLEAN CATCH  Final   Special Requests NONE  Final   Culture MULTIPLE SPECIES PRESENT, SUGGEST RECOLLECTION (A)  Final   Report Status 09/15/2016 FINAL  Final     Labs: BNP (  last 3 results) No results for input(s): BNP in the last 8760 hours. Basic Metabolic Panel:  Recent Labs Lab 09/14/16 0021 09/14/16 0044 09/15/16 0619  NA 137 139 140  K 4.4 4.3 3.5  CL 108 108 111  CO2 21*  --  21*  GLUCOSE 168* 167* 156*  BUN 17 17 14   CREATININE 0.97 0.90 1.06*  CALCIUM 9.3  --  8.7*   Liver Function Tests:  Recent Labs Lab 09/14/16 0021 09/15/16 0619  AST 49* 39  ALT 23 19  ALKPHOS 144* 113  BILITOT 1.0 1.0  PROT 7.5 6.4*  ALBUMIN 3.2* 2.7*   No results for input(s): LIPASE, AMYLASE in the last 168 hours.  Recent Labs Lab 09/14/16 0609 09/15/16 0619  AMMONIA 66* 21   CBC:  Recent Labs Lab 09/14/16 0021 09/14/16 0044 09/15/16 0619  WBC 5.1  --  4.0  NEUTROABS 2.9  --   --   HGB 8.3* 8.5* 8.2*  HCT 26.2* 25.0* 26.1*  MCV 89.1  --  90.3  PLT 212  --  179   Cardiac Enzymes: No results for input(s): CKTOTAL, CKMB, CKMBINDEX, TROPONINI in the last 168 hours. BNP: Invalid input(s): POCBNP CBG:  Recent  Labs Lab 09/14/16 0901 09/14/16 1156 09/14/16 1809 09/14/16 2132 09/15/16 0747  GLUCAP 191* 197* 176* 152* 134*   D-Dimer No results for input(s): DDIMER in the last 72 hours. Hgb A1c No results for input(s): HGBA1C in the last 72 hours. Lipid Profile No results for input(s): CHOL, HDL, LDLCALC, TRIG, CHOLHDL, LDLDIRECT in the last 72 hours. Thyroid function studies  Recent Labs  09/14/16 0609  TSH 1.848   Anemia work up  Recent Labs  09/15/16 0619  VITAMINB12 717  FOLATE 11.2  FERRITIN 35  TIBC 365  IRON 66  RETICCTPCT 2.8   Urinalysis    Component Value Date/Time   COLORURINE YELLOW 09/14/2016 0042   APPEARANCEUR HAZY (A) 09/14/2016 0042   LABSPEC 1.015 09/14/2016 0042   PHURINE 7.0 09/14/2016 0042   GLUCOSEU 50 (A) 09/14/2016 0042   HGBUR NEGATIVE 09/14/2016 0042   HGBUR negative 07/17/2008 [place] 09/14/2016 [place] 09/14/2016 0042   PROTEINUR NEGATIVE 09/14/2016 0042   UROBILINOGEN 1.0 04/27/2009 2108   NITRITE NEGATIVE 09/14/2016 0042   LEUKOCYTESUR TRACE (A) 09/14/2016 0042   Sepsis Labs Invalid input(s): PROCALCITONIN,  WBC,  LACTICIDVEN Microbiology Recent Results (from the past 240 hour(s))  Urine culture     Status: Abnormal   Collection Time: 09/14/16 12:42 AM  Result Value Ref Range Status   Specimen Description URINE, CLEAN CATCH  Final   Special Requests NONE  Final   Culture MULTIPLE SPECIES PRESENT, SUGGEST RECOLLECTION (A)  Final   Report Status 09/15/2016 FINAL  Final     Time coordinating discharge: Over 30 minutes  SIGNED:   Elmarie Shiley, MD  [facility] 09/15/2016, 10:50 AM Pager   If 7PM-7AM, please contact night-coverage [email or web address] Password [email or web address]

## 2016-09-17 DIAGNOSIS — E119 Type 2 diabetes mellitus without complications: Secondary | ICD-10-CM | POA: Diagnosis not present

## 2016-09-17 DIAGNOSIS — C220 Liver cell carcinoma: Secondary | ICD-10-CM | POA: Diagnosis not present

## 2016-09-17 DIAGNOSIS — K7460 Unspecified cirrhosis of liver: Secondary | ICD-10-CM | POA: Diagnosis not present

## 2016-09-17 DIAGNOSIS — I10 Essential (primary) hypertension: Secondary | ICD-10-CM | POA: Diagnosis not present

## 2016-09-17 DIAGNOSIS — F0390 Unspecified dementia without behavioral disturbance: Secondary | ICD-10-CM | POA: Diagnosis not present

## 2016-09-17 DIAGNOSIS — K589 Irritable bowel syndrome without diarrhea: Secondary | ICD-10-CM | POA: Diagnosis not present

## 2016-09-18 DIAGNOSIS — K7460 Unspecified cirrhosis of liver: Secondary | ICD-10-CM | POA: Diagnosis not present

## 2016-09-18 DIAGNOSIS — K589 Irritable bowel syndrome without diarrhea: Secondary | ICD-10-CM | POA: Diagnosis not present

## 2016-09-18 DIAGNOSIS — I10 Essential (primary) hypertension: Secondary | ICD-10-CM | POA: Diagnosis not present

## 2016-09-18 DIAGNOSIS — F0390 Unspecified dementia without behavioral disturbance: Secondary | ICD-10-CM | POA: Diagnosis not present

## 2016-09-18 DIAGNOSIS — E119 Type 2 diabetes mellitus without complications: Secondary | ICD-10-CM | POA: Diagnosis not present

## 2016-09-18 DIAGNOSIS — C220 Liver cell carcinoma: Secondary | ICD-10-CM | POA: Diagnosis not present

## 2016-09-19 ENCOUNTER — Telehealth: Payer: Self-pay

## 2016-09-19 ENCOUNTER — Encounter: Payer: Self-pay | Admitting: Physician Assistant

## 2016-09-19 ENCOUNTER — Ambulatory Visit (INDEPENDENT_AMBULATORY_CARE_PROVIDER_SITE_OTHER): Payer: Medicare Other | Admitting: Physician Assistant

## 2016-09-19 ENCOUNTER — Other Ambulatory Visit (INDEPENDENT_AMBULATORY_CARE_PROVIDER_SITE_OTHER): Payer: Medicare Other

## 2016-09-19 VITALS — BP 115/70 | HR 80 | Ht 65.0 in | Wt 160.0 lb

## 2016-09-19 DIAGNOSIS — K7581 Nonalcoholic steatohepatitis (NASH): Secondary | ICD-10-CM

## 2016-09-19 DIAGNOSIS — C228 Malignant neoplasm of liver, primary, unspecified as to type: Secondary | ICD-10-CM

## 2016-09-19 DIAGNOSIS — D638 Anemia in other chronic diseases classified elsewhere: Secondary | ICD-10-CM

## 2016-09-19 DIAGNOSIS — D508 Other iron deficiency anemias: Secondary | ICD-10-CM

## 2016-09-19 DIAGNOSIS — K7460 Unspecified cirrhosis of liver: Secondary | ICD-10-CM

## 2016-09-19 LAB — CBC WITH DIFFERENTIAL/PLATELET
Basophils Absolute: 0 10*3/uL (ref 0.0–0.1)
Basophils Relative: 0.8 % (ref 0.0–3.0)
EOS PCT: 3.3 % (ref 0.0–5.0)
Eosinophils Absolute: 0.2 10*3/uL (ref 0.0–0.7)
HEMATOCRIT: 27.5 % — AB (ref 36.0–46.0)
HEMOGLOBIN: 9 g/dL — AB (ref 12.0–15.0)
LYMPHS ABS: 1.1 10*3/uL (ref 0.7–4.0)
LYMPHS PCT: 20 % (ref 12.0–46.0)
MCHC: 32.8 g/dL (ref 30.0–36.0)
MCV: 89.4 fl (ref 78.0–100.0)
MONOS PCT: 15.2 % — AB (ref 3.0–12.0)
Monocytes Absolute: 0.8 10*3/uL (ref 0.1–1.0)
Neutro Abs: 3.4 10*3/uL (ref 1.4–7.7)
Neutrophils Relative %: 60.7 % (ref 43.0–77.0)
Platelets: 193 10*3/uL (ref 150.0–400.0)
RBC: 3.08 Mil/uL — AB (ref 3.87–5.11)
RDW: 19.5 % — ABNORMAL HIGH (ref 11.5–15.5)
WBC: 5.5 10*3/uL (ref 4.0–10.5)

## 2016-09-19 MED ORDER — FERROUS SULFATE 325 (65 FE) MG PO TABS: 325.0000 mg | ORAL_TABLET | Freq: Three times a day (TID) | ORAL | 1 refills | Status: DC

## 2016-09-19 MED ORDER — LACTULOSE 10 GM/15ML PO SOLN: 20.0000 g | Freq: Three times a day (TID) | ORAL | 3 refills | Status: DC

## 2016-09-19 NOTE — Patient Instructions (Addendum)
Please go to the basement level to have your labs drawn.  We have sent the following medications to your pharmacy for you to pick up at your convenience: [facility] [address] 1. Iron supplement 2. Lactulose 30 cc  You have been scheduled for an endoscopy. Please follow written instructions given to you at your visit today. If you use inhalers (even only as needed), please bring them with you on the day of your procedure. Your physician has requested that you go to [email or web address] and enter the access code given to you at your visit today. This web site gives a general overview about your procedure. However, you should still follow specific instructions given to you by our office regarding your preparation for the procedure.

## 2016-09-19 NOTE — Progress Notes (Addendum)
Subjective:    Patient ID: [name] [name], female    DOB: 02-13-1941, 76 y.o.   MRN: [id number]  HPI  Seher is a 76 year old white female, known to Dr. Hilarie Fredrickson who comes in today for further evaluation of progressive anemia, after recent hospitalization 510 through 09/15/2016. Patient was seen by Dr. Benay Spice during her admission for anemia. This is felt to be likely multifactorial. She was started on oral iron supplementation and was to have Hemoccults and also advise GI follow-up. Most recent hemoglobin on 09/14/2016 was 8.3 hematocrit 26.2 MCV of 89. One month ago hemoglobin was 8.4 hematocrit of 26.3, March 2018 hemoglobin 10.5 and in February 2017 hemoglobin 11.4. Most recent iron studies ferritin of 35 iron 66 TIBC of 299 and sat of 18. She has not had stool Hemoccult done. Most recent colonoscopy 07/15/2015 done for chronic diarrhea showed internal hemorrhoids prolapsing, and anastomosis at the hepatic flexure widely patent and multiple diverticuli. Biopsies done for microscopic colitis were negative. EGD 07/15/2015 revealed a 15 mm pedunculated polyp on the greater curve of the stomach and this was completely resected. Biopsy showed a hyperplastic polyp. She was also noted to have diffuse inflammation of the stomach and a few smaller sessile polyps consistent with fundic gland polyps. 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 She underwent chemoembolization in March 2018 and microwave ablation in April 2018. She has follow-up with interventional radiology in June. She did well with both procedures. Has also had issues with hepatic encephalopathy, sleep apnea, congestive heart failure, hypertension, and dementia. She was confused at the time of her most recent admission and has been started on lactulose 30 mL 3 times a day. Her husband has been giving it twice  daily. She has no current GI complaints. Her family states that her energy level has been poor and she spends most of the day on the sofa. She denies any abdominal discomfort has not noted any melena or hematochezia. She has apparently been running some low-grade fevers over the past several weeks in the 99 range. Her husband would like to take her to the beach for a few weeks if her condition is felt stable.   Review of Systems Pertinent positive and negative review of systems were noted in the above HPI section.  All other review of systems was otherwise negative.  Outpatient Encounter Prescriptions as of 09/19/2016  Medication Sig  . aspirin EC 81 MG tablet Take 81 mg by mouth daily.   . Calcium Carbonate-Vitamin D (CALCIUM 600+D) 600-400 MG-UNIT tablet Take 1 tablet by mouth daily.  . cephALEXin (KEFLEX) 500 MG capsule Take 1 capsule (500 mg total) by mouth every 12 (twelve) hours.  . diphenhydrAMINE (BENADRYL) 25 MG tablet Take 25 mg by mouth at bedtime.  . docusate sodium (COLACE) 100 MG capsule Take 100 mg by mouth daily as needed for mild constipation.  . ferrous sulfate 325 (65 FE) MG tablet Take 1 tablet (325 mg total) by mouth 3 (three) times daily with meals.  . insulin NPH Human (HUMULIN N,NOVOLIN N) 100 UNIT/ML injection Inject 0.08 mLs (8 Units total) into the skin 2 (two) times daily with a meal. 8 units in the morning and 8 units in the evening  . lactulose (CHRONULAC) 10 GM/15ML solution Take 30 mLs (20 g total) by mouth 3 (three) times daily.  Marland Kitchen loratadine (CLARITIN) 10 MG tablet Take 10  mg by mouth daily.  . meclizine (ANTIVERT) 25 MG tablet Take 1 tablet (25 mg total) by mouth every 4 (four) hours as needed for dizziness.  . metoprolol tartrate (LOPRESSOR) 25 MG tablet TAKE 1 TABLET TWICE DAILY  . Omega-3 Fatty Acids (FISH OIL PO) Take 1 capsule by mouth every evening.   Marland Kitchen omeprazole (PRILOSEC) 20 MG capsule TAKE 1 CAPSULE TWICE DAILY BEFORE MEALS  . ondansetron (ZOFRAN-ODT)  4 MG disintegrating tablet DISSOLVE ONE TABLET IN MOUTH EVERY 8 HOURS AS NEEDED FOR NAUSEA OR VOMITING (Patient taking differently: Take 4 mg by mouth every 8 (eight) hours as needed for nausea or vomiting. )  . traMADol (ULTRAM) 50 MG tablet Take 1 tablet (50 mg total) by mouth every 6 (six) hours as needed. (Patient taking differently: Take 50 mg by mouth every 6 (six) hours as needed for moderate pain. )  . [DISCONTINUED] ferrous sulfate 325 (65 FE) MG tablet Take 1 tablet (325 mg total) by mouth 2 (two) times daily with a meal.  . [DISCONTINUED] lactulose (CHRONULAC) 10 GM/15ML solution Take 30 mLs (20 g total) by mouth 3 (three) times daily.   No facility-administered encounter medications on file as of 09/19/2016.    No Known Allergies Patient Active Problem List   Diagnosis Date Noted  . Altered mental status 09/14/2016  . Hypokalemia 08/21/2016  . Hypomagnesemia 08/21/2016  . Diarrhea 08/21/2016  . SIRS (systemic inflammatory response syndrome) ([place]) 08/20/2016  . Systemic inflammatory response syndrome (SIRS) ([place]) 08/19/2016  . AKI (acute kidney injury) ([place]) 08/19/2016  . Hepatocellular carcinoma ([place]) 08/01/2016  . Abnormal angiogram 08/01/2016  . Mild dementia 03/28/2016  . Mild cognitive impairment 12/22/2015  . Memory loss or impairment 06/22/2015  . Nausea with vomiting 02/07/2014  . Fatty liver 07/28/2013  . Barrett's esophagus 07/28/2013  . Acute on chronic diastolic HF (heart failure) ([place]) 07/10/2013  . Chest pain 07/08/2013  . Dyspnea on exertion 07/07/2013  . Exertional angina ([place]) 07/07/2013  . CHF (congestive heart failure) ([place]) 07/07/2013  . Lumbar radiculopathy 05/06/2013  . Anemia, unspecified 05/06/2013  . OSA (obstructive sleep apnea)- C-pap 02/04/2013  . Arthralgia of multiple joints 12/13/2012  . DIZZINESS 05/25/2010  . POSTHERPETIC NEURALGIA 10/26/2009  . GERD 05/18/2008  . HIATAL HERNIA 05/18/2008  . DIVERTICULITIS, HX OF 05/18/2008  .  Essential hypertension 05/11/2008  . COLON CANCER, HX OF 05/11/2008  . SKIN CANCER, HX OF 05/11/2008  . 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 BLADDER CANCER , UNSPEC. 04/25/2007  . Diabetes mellitus type 2 in obese ([place]) 04/25/2007  . HYPERLIPIDEMIA 04/25/2007  . DIVERTICULOSIS, COLON 08/13/2006  . IBS 08/13/2006  . ELEVATION, TRANSAMINASE/LDH LEVELS 08/13/2006   Social History   Social History  . Marital status: Married    Spouse name: Carmelia Roller  . Number of children: 2  . Years of education: N/A   Occupational History  . 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 Smoking status: Former Smoker    Packs/day: 2.00    Years: 20.00    Types: Cigarettes    Quit date: 05/09/1975  . Smokeless tobacco: Never Used     Comment: smoked 1958-1977, up to 2 ppd  . Alcohol use No     Comment: rare  . Drug use: No  . Sexual activity: Not Currently   Other Topics Concern  . 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 Hastings family history includes Breast cancer in her sister and sister; Diabetes in her mother, sister, and sister; Esophageal cancer in her son; Hypertension in her sister, sister, and sister; Inflammatory bowel disease in her daughter; Lung cancer in her father; Lymphoma in her sister; Skin cancer in her mother.      Objective:    Vitals:   09/19/16 1336  BP: 115/70  Pulse: 80    Physical Exam  well-developed elderly white female in no acute distress, accompanied by her husband and daughter. Blood pressure 115/70, pulse 80, height 5 foot 5, weight 160, BMI of 26.6. HEENT; nontraumatic normocephalic EOMI PERRLA sclera anicteric, Cardiovascular; regular rate and rhythm with S1-S2 no murmur or gallop, Pulmonary; clear bilaterally, Abdomen; soft, nontender no appreciable fluid wave no palpable mass  bowel sounds are present she does have some fullness along lower midline incisional scar, Rectal ;exam stool brown and trace heme positive. Extremities; no clubbing cyanosis or edema skin warm and dry, Neuropsych mood and affect appropriate, she did defer to her husband to answer some questions, no asterixis       Assessment & Plan:   #45 76 year old white female with multiple serious medical problems seen today for progressive anemia, early iron deficiency. This is likely multifactorial. #2 hepatocellular carcinoma-status post chemoembolization and microwave ablation March and April 2018 #3 [name] cirrhosis complicated by encephalopathy #4 history of colon cancer 2005 status post ileocecectomy #5 dementia #6 sleep apnea #7 history of Barrett's esophagus #8 diverticulosis #10 congestive heart failure #11 diffuse gastritis and history of gastric polyps status post resection of a pedunculated hyperplastic polyp March 2017  Plan; repeat CBC today to assure  stability Will schedule for EGD with Dr. Hilarie Fredrickson. She is due for surveillance for esophageal varices and also can reassess gastric polyps for progression, and possible contribution to anemia. Continue ferrous sulfate 325 mg by mouth 3 times daily, husband asked for prescription and this was given Continue lactulose 30 mL by mouth twice daily. We reviewed the importance of giving  this on a daily basis with goal for at least 3 bowel movements per day. He is asked to give an extra dose if she is not having  adequate bowel movements. Continue omeprazole 20 mg twice a day. Patient has follow-up with interventional radiology mid June. Family plans to take the patient to the beach for couple of weeks. We discussed symptoms to watch out for regarding anemia and encephalopathy and need for evaluation if she has any change in her status.  Azeneth Carbonell Genia Harold PA-C 09/19/2016   Cc: [name], MD   Addendum: Reviewed and agree with  management. Would recommend the upper endoscopy be performed to follow my already scheduled cases at [facility] on 10/10/2016. Case felt best performed in the hospital setting due to availability of equipment and also should variceal banding be needed. [name], [name], MD

## 2016-09-19 NOTE — Telephone Encounter (Signed)
LMTCB

## 2016-09-20 ENCOUNTER — Telehealth: Payer: Self-pay | Admitting: Physician Assistant

## 2016-09-20 ENCOUNTER — Other Ambulatory Visit: Payer: Self-pay | Admitting: *Deleted

## 2016-09-20 MED ORDER — FERROUS SULFATE 325 (65 FE) MG PO TABS: 325.0000 mg | ORAL_TABLET | Freq: Three times a day (TID) | ORAL | 3 refills | Status: DC

## 2016-09-20 MED ORDER — LACTULOSE 10 GM/15ML PO SOLN: 20.0000 g | Freq: Two times a day (BID) | ORAL | 3 refills | Status: AC

## 2016-09-20 NOTE — Telephone Encounter (Signed)
D/C 09/15/16 To: home  Spoke with pt and husband. They both state that pt has improved since d/c. They have no questions or concerns at this time. He declined to schedule appt as they are going to the beach and are not sure when they will return. Advised husband to contact office if needed, and to call to schedule follow up once they return.   Dr. Sarajane Jews - [facility]   Transition Care Management Follow-up Telephone Call  How have you been since you were released from the hospital? Well, improving   Do you understand why you were in the hospital? yes   Do you understand the discharge instrcutions? yes  Items Reviewed:  Medications reviewed: yes  Allergies reviewed: yes  Dietary changes reviewed: yes  Referrals reviewed: yes   Functional Questionnaire:   Activities of Daily Living (ADLs):   She states they are independent in the following: ambulation, bathing and hygiene, feeding, continence, grooming, toileting and dressing States they require assistance with the following: none   Any transportation issues/concerns?: no   Any patient concerns? no   Confirmed importance and date/time of follow-up visits scheduled: yes   Confirmed with patient if condition begins to worsen call PCP or go to the ER.  Patient was given the Call-a-Nurse line [phone]: yes

## 2016-09-21 ENCOUNTER — Other Ambulatory Visit: Payer: Self-pay

## 2016-09-21 ENCOUNTER — Other Ambulatory Visit (HOSPITAL_COMMUNITY): Payer: Self-pay | Admitting: Interventional Radiology

## 2016-09-21 ENCOUNTER — Telehealth: Payer: Self-pay | Admitting: Family Medicine

## 2016-09-21 DIAGNOSIS — C220 Liver cell carcinoma: Secondary | ICD-10-CM

## 2016-09-21 NOTE — Telephone Encounter (Signed)
Patient calling back regarding this.  °

## 2016-09-21 NOTE — Telephone Encounter (Signed)
FYI:  Pt has gone to the beach with family. [facility] unable to see pt this week. Pt may be gone several weeks.  Nurse saw her on 5/13, but unable to see again until pt returns. Pt has stated they will call [place] when they return home

## 2016-09-21 NOTE — Telephone Encounter (Signed)
Dispensed amounts changed. She will be able to pick this up tomorrow. Cannot be filed on insurance until then.

## 2016-09-25 ENCOUNTER — Telehealth: Payer: Self-pay | Admitting: *Deleted

## 2016-09-25 ENCOUNTER — Other Ambulatory Visit: Payer: Self-pay

## 2016-09-25 DIAGNOSIS — D649 Anemia, unspecified: Secondary | ICD-10-CM

## 2016-09-25 NOTE — Telephone Encounter (Signed)
Addendum: Reviewed and agree with management. Would recommend the upper endoscopy be performed to follow my already scheduled cases at [facility] on 10/10/2016. Case felt best performed in the hospital setting due to availability of equipment and also should variceal banding be needed. [name], [name], MD    I have spoken to patient's husband, Herbie Baltimore (caregiver) to advise that Dr Hilarie Fredrickson has requested her endoscopy procedure be completed at the hospital due to the potential for needing esophageal banding which is not available in [place]. Patient's procedure has been rescheduled for 11/03/16 @ [facility] and new instructions have been mailed to patient's home address. Mr. ranell finelli understanding of this information.

## 2016-09-25 NOTE — Addendum Note (Signed)
Addended by: Larina Bras on: 09/25/2016 10:01 AM   Modules accepted: Orders, SmartSet

## 2016-09-26 ENCOUNTER — Telehealth: Payer: Self-pay | Admitting: Physician Assistant

## 2016-09-26 ENCOUNTER — Telehealth: Payer: Self-pay | Admitting: Internal Medicine

## 2016-09-26 MED ORDER — ONDANSETRON 4 MG PO TBDP: ORAL_TABLET | ORAL | 0 refills | Status: DC

## 2016-09-26 NOTE — Telephone Encounter (Signed)
A user error has taken place: Error °

## 2016-09-26 NOTE — Telephone Encounter (Signed)
Rx sent 

## 2016-09-27 ENCOUNTER — Ambulatory Visit: Payer: Medicare Other | Admitting: Neurology

## 2016-10-05 ENCOUNTER — Telehealth: Payer: Self-pay | Admitting: *Deleted

## 2016-10-05 NOTE — Telephone Encounter (Signed)
[place] [facility] Refill request Metronidazole 250mg ,  Take one tab three times a day,#21 Okay to fill?

## 2016-10-06 MED ORDER — METRONIDAZOLE 250 MG PO TABS: 250.0000 mg | ORAL_TABLET | Freq: Three times a day (TID) | ORAL | 0 refills | Status: DC

## 2016-10-06 NOTE — Telephone Encounter (Signed)
Please call this in  

## 2016-10-09 ENCOUNTER — Telehealth: Payer: Self-pay | Admitting: Internal Medicine

## 2016-10-09 NOTE — Telephone Encounter (Signed)
Can she be done in the [place]? She is currently scheduled at the hospital.

## 2016-10-09 NOTE — Telephone Encounter (Signed)
Can move EGD to this Wed, 10/11/16, at 2pm Would schedule zofran in the interim 4 mg TID Lactulose will be a chronic medication for her hepatic encephalopathy.  Rifaximin could replace it, but in the past this med was too expensive and I do not think covered.  We could try again to get rifaximin if lactulose is the culprit for worsening nausea

## 2016-10-09 NOTE — Telephone Encounter (Signed)
Pts husband states the pt is having more issues with nausea. States she has dry heaves and can't seem to stop. Reports she is able to eat fairly well. She is on lactulose to prevent hepatic encephalopathy. He is wondering how long she will have to be on this medication because he states once she takes it she usually has nausea and gets sick. Requesting EGD be done sooner. Plan to come home from beach tomorrow. Please advise regarding EGD and lactulose.

## 2016-10-10 NOTE — Telephone Encounter (Signed)
Pt scheduled for EGD with dil in the [place] 10/11/16@11am , pt to arrive here at 10am. Pt to be NPO after midnight. Pts husband aware.

## 2016-10-10 NOTE — Telephone Encounter (Signed)
Given patient desired to move procedure sooner, should be okay for [facility] tomorrow for upper endoscopy

## 2016-10-10 NOTE — Telephone Encounter (Signed)
Can pt be scheduled in the [place]?

## 2016-10-11 ENCOUNTER — Ambulatory Visit (AMBULATORY_SURGERY_CENTER): Payer: Medicare Other | Admitting: Internal Medicine

## 2016-10-11 ENCOUNTER — Encounter: Payer: Self-pay | Admitting: Internal Medicine

## 2016-10-11 VITALS — BP 160/76 | HR 78 | Temp 99.8°F | Resp 14 | Ht 65.0 in | Wt 160.0 lb

## 2016-10-11 DIAGNOSIS — K219 Gastro-esophageal reflux disease without esophagitis: Secondary | ICD-10-CM | POA: Diagnosis not present

## 2016-10-11 DIAGNOSIS — R110 Nausea: Secondary | ICD-10-CM

## 2016-10-11 DIAGNOSIS — K7460 Unspecified cirrhosis of liver: Secondary | ICD-10-CM | POA: Diagnosis not present

## 2016-10-11 DIAGNOSIS — J449 Chronic obstructive pulmonary disease, unspecified: Secondary | ICD-10-CM | POA: Diagnosis not present

## 2016-10-11 DIAGNOSIS — I10 Essential (primary) hypertension: Secondary | ICD-10-CM | POA: Diagnosis not present

## 2016-10-11 DIAGNOSIS — D509 Iron deficiency anemia, unspecified: Secondary | ICD-10-CM | POA: Diagnosis not present

## 2016-10-11 DIAGNOSIS — G4733 Obstructive sleep apnea (adult) (pediatric): Secondary | ICD-10-CM | POA: Diagnosis not present

## 2016-10-11 DIAGNOSIS — Z8719 Personal history of other diseases of the digestive system: Secondary | ICD-10-CM

## 2016-10-11 DIAGNOSIS — E119 Type 2 diabetes mellitus without complications: Secondary | ICD-10-CM | POA: Diagnosis not present

## 2016-10-11 DIAGNOSIS — C229 Malignant neoplasm of liver, not specified as primary or secondary: Secondary | ICD-10-CM | POA: Diagnosis not present

## 2016-10-11 MED ORDER — SODIUM CHLORIDE 0.9 % IV SOLN: 500.0000 mL | INTRAVENOUS | Status: DC

## 2016-10-11 NOTE — Op Note (Signed)
[facility] Patient Name: [name] [name] Procedure Date: 10/11/2016 11:00 AM MRN: [id number] Endoscopist: Jerene Bears , MD Age: 76 Referring MD:  Date of Birth: 10/16/1940 Gender: Female Account #: [id number] Procedure:                Upper GI endoscopy Indications:              Iron deficiency anemia, Cirrhosis rule out                            esophageal varices, Nausea Medicines:                Monitored Anesthesia Care Procedure:                Pre-Anesthesia Assessment:                           - Prior to the procedure, a History and Physical                            was performed, and patient medications and                            allergies were reviewed. The patient's tolerance of                            previous anesthesia was also reviewed. The risks                            and benefits of the procedure and the sedation                            options and risks were discussed with the patient.                            All questions were answered, and informed consent                            was obtained. Prior Anticoagulants: The patient has                            taken no previous anticoagulant or antiplatelet                            agents. ASA Grade Assessment: III - A patient with                            severe systemic disease. After reviewing the risks                            and benefits, the patient was deemed in                            satisfactory condition to undergo the procedure.  After obtaining informed consent, the endoscope was                            passed under direct vision. Throughout the                            procedure, the patient's blood pressure, pulse, and                            oxygen saturations were monitored continuously. The                            Model GIF-[id number] [id number]) scope was introduced                            through the mouth, and advanced to  the second part                            of duodenum. The upper GI endoscopy was                            accomplished without difficulty. The patient                            tolerated the procedure well. Scope In: Scope Out: Findings:                 Normal mucosa was found in the entire esophagus.                            Z-line slightly irregular, but no evidence of                            significant Barrett's esophagus.                           A non-obstructing Schatzki ring (acquired) was                            found at the gastroesophageal junction.                           A small hiatal hernia was present.                           Mild portal hypertensive gastropathy was found in                            the cardia, in the gastric fundus and in the                            gastric body.                           A few small sessile polyps with no bleeding and no  stigmata of recent bleeding were found in the                            gastric fundus and in the gastric body.                           Scattered mild inflammation characterized by                            erythema was found in the gastric antrum                            (previously biopsied and negative for H. Pylori).                           The examined duodenum was normal. Complications:            No immediate complications. Estimated Blood Loss:     Estimated blood loss: none. Impression:               - Normal mucosa was found in the entire esophagus.                           - Non-obstructing Schatzki ring.                           - Small hiatal hernia.                           - Portal hypertensive gastropathy.                           - A few gastric polyps (nonbleeding).                           - Mild, nonbleeding, gastritis.                           - Normal examined duodenum.                           - No specimens  collected. Recommendation:           - Patient has a contact number available for                            emergencies. The signs and symptoms of potential                            delayed complications were discussed with the                            patient. Return to normal activities tomorrow.                            Written discharge instructions were provided to the  patient.                           - Resume previous diet.                           - Continue present medications. Can re-attempt                            rifaximin 550 mg BID to replace lactulose for                            hepatic encephalopathy (previously expensive,                            lactulose may be worsening nausea). Can schedule                            Zofran 4-8 mg three times daily for nausea.                           - Monitor oral iron and replace IV if needed. Jerene Bears, MD 10/11/2016 11:23:10 AM This report has been signed electronically.

## 2016-10-11 NOTE — Patient Instructions (Signed)
YOU HAD AN ENDOSCOPIC PROCEDURE TODAY AT [facility]:   Refer to the procedure report that was given to you for any specific questions about what was found during the examination.  If the procedure report does not answer your questions, please call your gastroenterologist to clarify.  If you requested that your care partner not be given the details of your procedure findings, then the procedure report has been included in a sealed envelope for you to review at your convenience later.  YOU SHOULD EXPECT: Some feelings of bloating in the abdomen. Passage of more gas than usual.  Walking can help get rid of the air that was put into your GI tract during the procedure and reduce the bloating. If you had a lower endoscopy (such as a colonoscopy or flexible sigmoidoscopy) you may notice spotting of blood in your stool or on the toilet paper. If you underwent a bowel prep for your procedure, you may not have a normal bowel movement for a few days.  Please Note:  You might notice some irritation and congestion in your nose or some drainage.  This is from the oxygen used during your procedure.  There is no need for concern and it should clear up in a day or so.  SYMPTOMS TO REPORT IMMEDIATELY:     Following upper endoscopy (EGD)  Vomiting of blood or coffee ground material  New chest pain or pain under the shoulder blades  Painful or persistently difficult swallowing  New shortness of breath  Fever of 100F or higher  Black, tarry-looking stools  For urgent or emergent issues, a gastroenterologist can be reached at any hour by calling [phone].   DIET:  We do recommend a small meal at first, but then you may proceed to your regular diet.  Drink plenty of fluids but you should avoid alcoholic beverages for 24 hours.  ACTIVITY:  You should plan to take it easy for the rest of today and you should NOT DRIVE or use heavy machinery until tomorrow (because of the sedation medicines  used during the test).    FOLLOW UP: Our staff will call the number listed on your records the next business day following your procedure to check on you and address any questions or concerns that you may have regarding the information given to you following your procedure. If we do not reach you, we will leave a message.  However, if you are feeling well and you are not experiencing any problems, there is no need to return our call.  We will assume that you have returned to your regular daily activities without incident.  If any biopsies were taken you will be contacted by phone or by letter within the next 1-3 weeks.  Please call us at [phone] if you have not heard about the biopsies in 3 weeks.    SIGNATURES/CONFIDENTIALITY: You and/or your care partner have signed paperwork which will be entered into your electronic medical record.  These signatures attest to the fact that that the information above on your After Visit Summary has been reviewed and is understood.  Full responsibility of the confidentiality of this discharge information lies with you and/or your care-partner.   Continue medications.

## 2016-10-11 NOTE — Progress Notes (Signed)
Report to PACU, RN, vss, BBS= Clear.  

## 2016-10-11 NOTE — Progress Notes (Signed)
Verified with Dr.[name] concerning medication listed on report he stated "I will have Vaughan Basta send in because I may do speciality rx.

## 2016-10-12 ENCOUNTER — Telehealth: Payer: Self-pay

## 2016-10-12 ENCOUNTER — Other Ambulatory Visit: Payer: Self-pay

## 2016-10-12 ENCOUNTER — Telehealth: Payer: Self-pay | Admitting: *Deleted

## 2016-10-12 DIAGNOSIS — C220 Liver cell carcinoma: Secondary | ICD-10-CM | POA: Diagnosis not present

## 2016-10-12 DIAGNOSIS — I10 Essential (primary) hypertension: Secondary | ICD-10-CM | POA: Diagnosis not present

## 2016-10-12 DIAGNOSIS — E119 Type 2 diabetes mellitus without complications: Secondary | ICD-10-CM | POA: Diagnosis not present

## 2016-10-12 DIAGNOSIS — F0390 Unspecified dementia without behavioral disturbance: Secondary | ICD-10-CM | POA: Diagnosis not present

## 2016-10-12 DIAGNOSIS — K589 Irritable bowel syndrome without diarrhea: Secondary | ICD-10-CM | POA: Diagnosis not present

## 2016-10-12 DIAGNOSIS — K7460 Unspecified cirrhosis of liver: Secondary | ICD-10-CM | POA: Diagnosis not present

## 2016-10-12 MED ORDER — RIFAXIMIN 550 MG PO TABS: 550.0000 mg | ORAL_TABLET | Freq: Two times a day (BID) | ORAL | 3 refills | Status: DC

## 2016-10-12 NOTE — Telephone Encounter (Signed)
  Follow up Call-  Call back number 10/11/2016 07/15/2015  Post procedure Call Back phone  # [phone]  Permission to leave phone message Yes Yes  Some recent data might be hidden     Patient questions:  Do you have a fever, pain , or abdominal swelling? No. Pain Score  0 *  Have you tolerated food without any problems? Yes.    Have you been able to return to your normal activities? Yes.    Do you have any questions about your discharge instructions: Diet   No. Medications  No. Follow up visit  No.  Do you have questions or concerns about your Care? No.  Actions: * If pain score is 4 or above: No action needed, pain <4.

## 2016-10-12 NOTE — Telephone Encounter (Signed)
Appt cancelled

## 2016-10-12 NOTE — Telephone Encounter (Signed)
-----   Message from Jerene Bears, MD sent at 10/11/2016  5:14 PM EDT ----- Regarding: RE: Hospital procedure Yes Cancel the hospital procedure JMP  ----- Message ----- From: Algernon Huxley, RN Sent: 10/11/2016   2:41 PM To: Jerene Bears, MD Subject: Hospital procedure                             Do you want me to cancel the hospital procedure that is scheduled 6/29 on this pt?  Just wanted to make sure before cancelling it.

## 2016-10-16 ENCOUNTER — Telehealth: Payer: Self-pay

## 2016-10-16 NOTE — Telephone Encounter (Signed)
Levada Dy, RN @ [facility] states that she was out to see pt this past Friday. Family reports that pt fell that previous Monday after having an episode of vertigo. They state that pt did hit her head and may have had LOC. She now c/o left hip and leg pain. Levada Dy has evaluated pt and did not find any obvious injuries, dislocation or bruising however pt states she is in pain. Levada Dy has recommended family take pt to ED for evaluation of possible head/hip/leg injury or to at least contact our office for pt to be seen. Per chart pt has not been seen or evaluated within [facility] system.    LMTCB

## 2016-10-17 NOTE — Telephone Encounter (Signed)
Spoke with pt's daughter. She states that pt is doing well. They do not feel she needs to be seen at this time. Her pain has become minimal and she is ambulating well. They will contact office if she does not continue to improve. Nothing further needed at this time.

## 2016-10-20 ENCOUNTER — Encounter: Payer: Self-pay | Admitting: Interventional Radiology

## 2016-10-20 ENCOUNTER — Telehealth: Payer: Self-pay | Admitting: Family Medicine

## 2016-10-20 NOTE — Telephone Encounter (Signed)
I spoke with Barnett Applebaum and verbal okay for below order.

## 2016-10-20 NOTE — Telephone Encounter (Signed)
Set this up please

## 2016-10-20 NOTE — Telephone Encounter (Signed)
[name] [name] with [facility] would like a verbal for  one visit next week to re-evaluate pt  Her regular nurse is out of town and they would prefer the same nurse see her next week.

## 2016-10-23 ENCOUNTER — Encounter: Payer: Self-pay | Admitting: Interventional Radiology

## 2016-10-23 DIAGNOSIS — K769 Liver disease, unspecified: Secondary | ICD-10-CM | POA: Diagnosis not present

## 2016-10-23 LAB — COMPLETE METABOLIC PANEL WITH GFR
ALT: 30 U/L — ABNORMAL HIGH (ref 6–29)
AST: 65 U/L — ABNORMAL HIGH (ref 10–35)
Albumin: 2.8 g/dL — ABNORMAL LOW (ref 3.6–5.1)
Alkaline Phosphatase: 180 U/L — ABNORMAL HIGH (ref 33–130)
BUN: 15 mg/dL (ref 7–25)
CHLORIDE: 106 mmol/L (ref 98–110)
CO2: 21 mmol/L (ref 20–31)
Calcium: 9.2 mg/dL (ref 8.6–10.4)
Creat: 1.12 mg/dL — ABNORMAL HIGH (ref 0.60–0.93)
GFR, EST NON AFRICAN AMERICAN: 48 mL/min — AB (ref >=60)
GFR, Est African American: 56 mL/min — ABNORMAL LOW (ref >=60)
GLUCOSE: 220 mg/dL — AB (ref 65–99)
POTASSIUM: 3.7 mmol/L (ref 3.5–5.3)
SODIUM: 137 mmol/L (ref 135–146)
Total Bilirubin: 1.4 mg/dL — ABNORMAL HIGH (ref 0.2–1.2)
Total Protein: 6.7 g/dL (ref 6.1–8.1)

## 2016-10-23 LAB — CBC
HCT: 30.9 % — ABNORMAL LOW (ref 35.0–45.0)
HEMOGLOBIN: 9.8 g/dL — AB (ref 11.7–15.5)
MCH: 29.7 pg (ref 27.0–33.0)
MCHC: 31.7 g/dL — ABNORMAL LOW (ref 32.0–36.0)
MCV: 93.6 fL (ref 80.0–100.0)
MPV: 8.9 fL (ref 7.5–12.5)
Platelets: 166 10*3/uL (ref 140–400)
RBC: 3.3 MIL/uL — ABNORMAL LOW (ref 3.80–5.10)
RDW: 17.3 % — ABNORMAL HIGH (ref 11.0–15.0)
WBC: 4.4 10*3/uL (ref 3.8–10.8)

## 2016-10-24 ENCOUNTER — Ambulatory Visit (HOSPITAL_COMMUNITY)
Admission: RE | Admit: 2016-10-24 | Discharge: 2016-10-24 | Disposition: A | Discharge status: Home / Self Care | Payer: Medicare Other | Source: Ambulatory Visit | Attending: Interventional Radiology | Admitting: Interventional Radiology

## 2016-10-24 ENCOUNTER — Ambulatory Visit
Admission: RE | Admit: 2016-10-24 | Discharge: 2016-10-24 | Disposition: A | Discharge status: Home / Self Care | Payer: Medicare Other | Source: Ambulatory Visit | Attending: Student | Admitting: Student

## 2016-10-24 DIAGNOSIS — I8500 Esophageal varices without bleeding: Secondary | ICD-10-CM | POA: Diagnosis not present

## 2016-10-24 DIAGNOSIS — C220 Liver cell carcinoma: Secondary | ICD-10-CM | POA: Insufficient documentation

## 2016-10-24 DIAGNOSIS — I700 Atherosclerosis of aorta: Secondary | ICD-10-CM | POA: Diagnosis not present

## 2016-10-24 DIAGNOSIS — I864 Gastric varices: Secondary | ICD-10-CM | POA: Diagnosis not present

## 2016-10-24 DIAGNOSIS — R188 Other ascites: Secondary | ICD-10-CM | POA: Diagnosis not present

## 2016-10-24 DIAGNOSIS — K7460 Unspecified cirrhosis of liver: Secondary | ICD-10-CM | POA: Diagnosis not present

## 2016-10-24 DIAGNOSIS — R161 Splenomegaly, not elsewhere classified: Secondary | ICD-10-CM | POA: Diagnosis not present

## 2016-10-24 DIAGNOSIS — K8689 Other specified diseases of pancreas: Secondary | ICD-10-CM | POA: Insufficient documentation

## 2016-10-24 HISTORY — PX: IR RADIOLOGIST EVAL & MGMT: IMG5224

## 2016-10-24 LAB — AFP TUMOR MARKER: AFP TUMOR MARKER: 320.5 ng/mL — AB (ref <6.1)

## 2016-10-24 LAB — POCT I-STAT CREATININE: CREATININE: 1.1 mg/dL — AB (ref 0.44–1.00)

## 2016-10-24 LAB — PROTIME-INR
INR: 1.2 — ABNORMAL HIGH
Prothrombin Time: 12.5 s — ABNORMAL HIGH (ref 9.0–11.5)

## 2016-10-24 MED ORDER — GADOXETATE DISODIUM 0.25 MMOL/ML IV SOLN
10.0000 mL | Freq: Once | INTRAVENOUS | Status: AC | PRN
  Administered 2016-10-24: 9 mL via INTRAVENOUS

## 2016-10-24 NOTE — Progress Notes (Signed)
Chief Complaint: Patient was seen in consultation today for  Chief Complaint  Patient presents with  . Follow-up    follow up Thermal Ablation of Liver   at the request of [name] [name],[name] [name]  Referring Physician(s): [name] [name],[name] [name]  History of Present Illness: [name] [name] is a 76 y.o. 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 Patient underwent a combined DEB-TACE procedure in late March 2018 followed by microwave ablation on 09/01/2016. She presents today for 6 week follow-up evaluation and discussion of repeat imaging.  She has had deterioration in her clinical status. Her fatigue is progressive and now she sleeps greater than 50% of the day. Additionally, she intermittently becomes nauseated and has dry heaves. She has not lost significant weight and her by mouth intake remains adequate.  Otherwise, she denies new clinical symptoms.  Past Medical History:  Diagnosis Date  . Adenocarcinoma, breast [facility])    sees Dr. Marcy Panning   . Allergy    environmental  . Arthritis    sees Dr. Hurley Cisco   . Barrett esophagus 12/20/11  . Bladder cancer ([place])   . Breast cancer ([place])   . Cancer, hepatocellular ([place])   . CHF (congestive heart failure) ([place])   . Dementia    mild  . Diabetes mellitus    sees Dr. Chalmers Cater   . Diverticulosis of colon (without mention of hemorrhage) 09/26/2004,09/10/2006  . Gastritis   . GERD (gastroesophageal reflux disease)   . Hemorrhoids   . Hepatic cirrhosis ([place])   . Hepatic steatosis   . Hiatal hernia   . Hyperglycemia   . Hyperlipidemia   . Hypertension   . IBS (irritable bowel syndrome)   . Inflammatory polyps of colon ([place])   . Iron deficiency anemia   . Malignant neoplasm of colon, unspecified site 07/08/2003  . Personal history of chemotherapy   . Personal history of radiation therapy   . Sleep apnea    sees Dr. Gwenette Greet   . Transaminase or LDH elevation    sees Dr. Zenovia Jarred     Past Surgical History:  Procedure Laterality Date  . bladder cancer 2001    . BREAST BIOPSY    . BREAST SURGERY  2001   lymph nodes removed and rt mastectomy  . CARPAL TUNNEL RELEASE    . [place]   for diverticulitis  . COLONOSCOPY    . ESOPHAGEAL DILATION    . incisonal hernia    . IR GENERIC HISTORICAL  04/25/2016   IR RADIOLOGIST EVAL & MGMT 04/25/2016 [name], MD [facility] INTER[facility] RAD  . IR GENERIC HISTORICAL  08/01/2016   IR 3D INDEPENDENT [facility]ST 08/01/2016 [facility]  . IR GENERIC HISTORICAL  08/01/2016   IR ANGIOGRAM VISCERAL SELECTIVE 08/01/2016 [facility]  . IR GENERIC HISTORICAL  08/01/2016   IR EMBO TUMOR ORGAN ISCHEMIA INFARCT INC GUIDE ROADMAPPING 08/01/2016 [facility]  . IR GENERIC HISTORICAL  08/01/2016   IR US GUIDE VASC ACCESS RIGHT 08/01/2016 [facility]  . IR GENERIC HISTORICAL  08/01/2016   IR ANGIOGRAM SELECTIVE EACH ADDITIONAL VESSEL 08/01/2016 [facility]  . IR GENERIC HISTORICAL  08/01/2016   IR ANGIOGRAM SELECTIVE EACH ADDITIONAL VESSEL 08/01/2016 [facility]  . IR RADIOLOGIST EVAL & MGMT  08/17/2016  . IR RADIOLOGIST EVAL & MGMT  07/19/2016  . MASTECTOMY    . mastectomy and biopsy    . RADIOFREQUENCY ABLATION N/A 09/01/2016   Procedure: MICROWAVE THERMAL ABLATION;  Surgeon: [name], MD;  Location: [facility];  Service: Anesthesiology;  Laterality: N/A;  . sigmoid colectomy 2005     for cancer  . 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  Medications: Prior to Admission medications   Medication Sig Start Date End Date Taking? Authorizing Provider  Calcium Carbonate-Vitamin D (CALCIUM 600+D) 600-400 MG-UNIT tablet Take 1 tablet by mouth daily.   Yes [provider]  diphenhydrAMINE (BENADRYL) 25 MG tablet Take 25 mg by mouth at bedtime.   Yes [provider]  docusate sodium (COLACE) 100 MG capsule Take 100 mg by mouth daily as needed for mild constipation.    Yes [provider]  ferrous sulfate 325 (65 FE) MG tablet Take 1 tablet (325 mg total) by mouth 3 (three) times daily with meals. 09/20/16  Yes [name], [name], PA-C  insulin NPH Human (HUMULIN N,NOVOLIN N) 100 UNIT/ML injection Inject 0.08 mLs (8 Units total) into the skin 2 (two) times daily with a meal. 8 units in the morning and 8 units in the evening Patient taking differently: Inject 8 Units into the skin 2 (two) times daily with a meal. 8 units in the morning and 8 units in the evening  Husband states that she is receiving 20 units every AM and 20 units every PM 09/15/16  Yes Regalado, [name], MD  lactulose (CHRONULAC) 10 GM/15ML solution Take 30 mLs (20 g total) by mouth 2 (two) times daily. 09/20/16  Yes [name], [name], PA-C  loratadine (CLARITIN) 10 MG tablet Take 10 mg by mouth daily.   Yes [provider]  meclizine (ANTIVERT) 25 MG tablet Take 1 tablet (25 mg total) by mouth every 4 (four) hours as needed for dizziness. 05/13/14  Yes [name], MD  metoprolol tartrate (LOPRESSOR) 25 MG tablet TAKE 1 TABLET TWICE DAILY 04/24/16  Yes Lorretta Harp, MD  metroNIDAZOLE (FLAGYL) 250 MG tablet Take 1 tablet (250 mg total) by mouth 3 (three) times daily. 10/06/16  Yes [name], MD  Omega-3 Fatty Acids (FISH OIL PO) Take 1 capsule by mouth every evening.    Yes [provider]  omeprazole (PRILOSEC) 20 MG capsule TAKE 1 CAPSULE TWICE DAILY BEFORE MEALS 06/22/16  Yes [name], [name], MD  ondansetron (ZOFRAN-ODT) 4 MG disintegrating tablet DISSOLVE ONE TABLET IN MOUTH EVERY 8 HOURS AS NEEDED FOR NAUSEA OR VOMITING 09/26/16  Yes [name], [name], MD  rifaximin (XIFAXAN) 550 MG TABS tablet Take 1 tablet (550 mg total) by mouth 2 (two) times daily. 10/12/16  Yes [name], [name], MD  traMADol (ULTRAM) 50 MG tablet Take 1 tablet (50 mg total) by mouth every 6 (six) hours as needed. Patient taking differently: Take 50 mg by mouth every 6 (six) hours as needed for moderate  pain.  03/11/15  Yes [name], MD  aspirin EC 81 MG tablet Take 81 mg by mouth daily.     [provider]  cephALEXin (KEFLEX) 500 MG capsule Take 1 capsule (500 mg total) by mouth every 12 (twelve) hours. Patient not taking: Reported on 10/24/2016 09/15/16   Elmarie Shiley, MD     Family History  Problem Relation Age of Onset  . Diabetes Mother   . Skin cancer Mother   . Lung cancer Father   . Lymphoma Sister   . Hypertension Sister   . Breast cancer Sister   . Diabetes  Sister   . Hypertension Sister   . Breast cancer Sister   . Diabetes Sister   . Hypertension Sister   . Inflammatory bowel disease Daughter   . Esophageal cancer Son   . Stroke Neg Hx   . Colon cancer Neg Hx     Social History   Social History  . Marital status: Married    Spouse name: Carmelia Roller  . Number of children: 2  . Years of education: N/A   Occupational History  . 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 Smoking status: Former Smoker    Packs/day: 2.00    Years: 20.00    Types: Cigarettes    Quit date: 05/09/1975  . Smokeless tobacco: Never Used     Comment: smoked 1958-1977, up to 2 ppd  . Alcohol use No     Comment: rare  . Drug use: No  . Sexual activity: Not Currently   Other Topics Concern  . 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  All other systems are negative.  Review of Systems  Vital Signs: BP 111/63   Pulse 89   Temp 98.1 F (36.7 C) (Oral)   Resp 14   Ht 5\' 5"  (1.651 m)   Wt 158 lb (71.7 kg)   SpO2 98%   BMI 26.29 kg/m   Physical Exam  Constitutional: She is oriented to person, place, and time. She appears well-developed and well-nourished. She has a sickly  appearance. No distress.  HENT:  Head: Normocephalic and atraumatic.  Eyes: Left eye exhibits no discharge. No scleral icterus.  Cardiovascular: Normal rate and regular rhythm.   Pulmonary/Chest: Effort normal.  Abdominal: Soft. She exhibits no distension. There is no tenderness.  Neurological: She is alert and oriented to person, place, and time.  Skin: Skin is warm and dry.  Psychiatric: She has a normal mood and affect.    Mallampati Score:     Imaging: Mr Abdomen Wwo Contrast  Result Date: 10/24/2016 CLINICAL DATA:  Segment 6 right liver lobe hepatocellular carcinoma status post DEB-TACE 08/01/2016 and percutaneous thermal ablation 09/01/2016, presenting for restaging. EXAM: MRI ABDOMEN WITHOUT AND WITH CONTRAST TECHNIQUE: Multiplanar multisequence MR imaging of the abdomen was performed both before and after the administration of intravenous contrast. CONTRAST:  15mL EOVIST GADOXETATE DISODIUM 0.25 MOL/L IV SOLN COMPARISON:  07/19/2016 MRI abdomen. FINDINGS: Lower chest: Patchy reticular opacities at both lung bases, not appreciably changed since 07/21/2016 CT. No acute abnormality at the lung bases. Hepatobiliary: Liver surface is diffusely markedly irregular with mild relative hypertrophy of the lateral segment left liver lobe, compatible with cirrhosis. No hepatic steatosis. There is a large irregular region of arterial phase hyperenhancement throughout the right liver lobe surrounding the segment 6 ablation defect measuring up to the 9.4 x 8.8 cm (series 801/image 41), with associated restricted diffusion and delayed phase washout and capsular enhancement, significantly increased from 3.8 x 3.1 cm on 07/19/2016, compatible with significant progression of now widely infiltrative hepatocellular carcinoma. There is a 1.3 x 0.9 cm segment 2 left liver dome mass (series 17/image 25) with arterial phase hyperenhancement, subtle contrast washout and hepatobiliary phase  hypoenhancement, increased  from 0.6 x 0.6 cm on 07/19/2016 MRI, compatible with a LI-RADS category 5 lesion. There is bulky new expansile tumor thrombus occluding the main, right and left portal veins, which is continuous with the infiltrative right liver lobe neoplasm. Nondistended gallbladder. No cholelithiasis. Moderate diffuse gallbladder wall thickening. No biliary ductal dilatation. Common bile duct diameter 4 mm. No choledocholithiasis. Pancreas: Stable nonenhancing 1.6 x 1.3 cm pancreatic neck cystic lesion (series 14/ image 31), previously 1.6 x 1.2 cm, without wall thickening or internal complexity. No additional pancreatic lesions. No pancreatic duct dilation. No pancreas divisum. Spleen: Mild splenomegaly (craniocaudal splenic length 14.9 cm, previously 13.0 cm), increased. Adrenals/Urinary Tract: No discrete adrenal nodules. No hydronephrosis. Normal kidneys with no renal mass. Stomach/Bowel: Grossly normal stomach. Visualized small and large bowel is normal caliber, with no bowel wall thickening. Vascular/Lymphatic: Atherosclerotic nonaneurysmal abdominal aorta. Stable enhancing soft tissue encasing the inferior mesenteric artery measuring up to 3.5 x 2.1 cm (series 803/ image 83). Patent hepatic, splenic and renal veins. Stable mildly enlarged 1.0 cm right pericardiophrenic node. Small to moderate paraumbilical varices and mild gastroesophageal varices are not appreciably changed. Other: Small to moderate volume ascites is largely new. No focal fluid collections. Musculoskeletal: No aggressive appearing focal osseous lesions. IMPRESSION: 1. Significant progression of multifocal hepatocellular carcinoma, which is now widely infiltrative throughout the right liver lobe, with additional growth of a small segment 2 left liver lobe HCC. 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 2. Cirrhosis. Small to moderate volume ascites, new. Mild splenomegaly, increased. Stable small to moderate  paraumbilical and mild gastroesophageal varices. 3. New moderate diffuse gallbladder wall thickening without cholelithiasis, probably due to noninflammatory edema . 4. Stable 1.6 cm pancreatic neck cystic lesion without high risk MRI features. 5. Stable nonspecific enhancing soft tissue encasing the IMA, favor inflammatory. 6. Aortic atherosclerosis. These results were called by telephone at the time of interpretation on 10/24/2016 at 12:06 pm to Dr. [name] , who verbally acknowledged these results. Electronically Signed   By: Ilona Sorrel M.D.   On: 10/24/2016 12:07    Labs:  CBC:  Recent Labs  09/14/16 0021 09/14/16 0044 09/15/16 0619 09/19/16 1450 10/23/16 0943  WBC 5.1  --  4.0 5.5 4.4  HGB 8.3* 8.5* 8.2* 9.0* 9.8*  HCT 26.2* 25.0* 26.1* 27.5* 30.9*  PLT 212  --  179 193.0 166    COAGS:  Recent Labs  04/11/16 1457 05/04/16 1129  07/04/16 1830  08/17/16 1606 08/19/16 2039 08/28/16 1453 10/23/16 0943  INR 1.29 1.23  < > 1.22  < > 1.3* 1.42 1.28 1.2*  APTT 39* 43*  --  36  --   --  36  --   --   < > = values in this interval not displayed.  BMP:  Recent Labs  09/02/16 0439 09/14/16 0021 09/14/16 0044 09/15/16 0619 10/23/16 0943 10/24/16 1042  NA 140 137 139 140 137  --   K 3.6 4.4 4.3 3.5 3.7  --   CL 109 108 108 111 106  --   CO2 24 21*  --  21* 21  --   GLUCOSE 119* 168* 167* 156* 220*  --   BUN 12 17 17 14 15   --   CALCIUM 9.2 9.3  --  8.7* 9.2  --   CREATININE 1.16* 0.97 0.90 1.06* 1.12* 1.10*  GFRNONAA 45* 56*  --  50* 48*  --   GFRAA 52* >60  --  58* 56*  --     LIVER FUNCTION TESTS:  Recent Labs  09/02/16 0439 09/14/16 0021 09/15/16 0619 10/23/16 0943  BILITOT 1.1 1.0 1.0 1.4*  AST 113* 49* 39 65*  ALT 27 23 19  30*  ALKPHOS 90 144* 113 180*  PROT 5.7* 7.5 6.4* 6.7  ALBUMIN 2.4* 3.2* 2.7* 2.8*    TUMOR MARKERS:  Recent Labs  06/15/16 1201 10/23/16 0943  AFPTM 5.5 320.5*    Assessment and Plan:  Unfortunately, there  has been significant interval progression of disease in the short interval since March. She now has multifocal disease throughout her right liver with extensive tumor thrombus filling the right, left and main portal veins. Additionally, she has evidence of metastatic disease in the left liver.  Due to the occlusive tumor thrombus in the portal vein, she is no longer a candidate for liver directed therapy. Additionally, her performance status has deteriorated which is not unexpected given the rapid growth and aggressive nature of her disease. Her next options are palliative chemotherapy with an agent like Nexavar, or hospice.  I spent a long time explaining this significant change in her tumor behavior and disease burden to both her, her husband and her daughter. I answered their questions to the best of my ability. I conveyed to them that her prognosis is very poor.  I will call her oncologist, Dr. Julieanne Manson, to let him know of the change in her status and disease process.    I wish Mrs. Gearing and family the best.   Thank you for this interesting consult.  I greatly enjoyed meeting SHAINDEL SWEETEN and look forward to participating in their care.  A copy of this report was sent to the requesting provider on this date.  Electronically Signed: [name] 10/24/2016, 2:54 PM   I spent a total of  25 Minutes in face to face in clinical consultation, greater than 50% of which was counseling/coordinating care for multifocal [place] with occlusive tumor thrombus.

## 2016-10-25 ENCOUNTER — Other Ambulatory Visit: Payer: Self-pay | Admitting: Radiology

## 2016-10-25 ENCOUNTER — Telehealth: Payer: Self-pay | Admitting: Internal Medicine

## 2016-10-25 ENCOUNTER — Other Ambulatory Visit: Payer: Self-pay | Admitting: Internal Medicine

## 2016-10-25 ENCOUNTER — Telehealth: Payer: Self-pay | Admitting: Oncology

## 2016-10-25 ENCOUNTER — Telehealth: Payer: Self-pay | Admitting: Neurology

## 2016-10-25 ENCOUNTER — Ambulatory Visit: Payer: Medicare Other | Admitting: Neurology

## 2016-10-25 DIAGNOSIS — C220 Liver cell carcinoma: Secondary | ICD-10-CM

## 2016-10-25 MED ORDER — PROMETHAZINE HCL 12.5 MG PO TABS: 12.5000 mg | ORAL_TABLET | Freq: Four times a day (QID) | ORAL | 1 refills | Status: AC | PRN

## 2016-10-25 MED ORDER — RIFAXIMIN 550 MG PO TABS: 550.0000 mg | ORAL_TABLET | Freq: Two times a day (BID) | ORAL | 3 refills | Status: AC

## 2016-10-25 NOTE — Telephone Encounter (Signed)
sw pt husband to confirm 6/25 appt at 0900 per sch msg

## 2016-10-25 NOTE — Progress Notes (Signed)
Rx for Phenergan phoned to [facility].  Tammy (patient's daughter) phoned w/ info that Rx will be called in & ready later this evening.    Ewel Lona Riki Rusk, RN 10/25/2016 5:31 PM

## 2016-10-25 NOTE — Telephone Encounter (Signed)
Caller: Park Liter (daughter)  Urgent? No  Reason for the call: They cancelled yesterday's appointment due to not needing to come. She went to her other Doctor yesterday and was told that her Liver cancer was Terminal. She wanted Dr. Delice Lesch to know. Thank you

## 2016-10-25 NOTE — Telephone Encounter (Signed)
kandis states that patient does not want to take lactulose anymore because of the taste. Patient is wanting to switch over to xifixan but her copay is too expensive. Hubert Azure states that she talked to patients insurance and they told her to that our office could call them and ask to speak to clinical pharmacy review so that medication wouldn't be as expensive.   [employer] P: [phone]   Hubert Azure is requesting that our office call her before contacting the insurance. She knows that our office cannot give out information because she is not on DPR yet but she is wanting to give our office additional information regarding this.   Hubert Azure states that this is urgent because patient is not wanting to take lactulose.

## 2016-10-25 NOTE — Telephone Encounter (Signed)
Patient granddaughter calling in again regarding this.

## 2016-10-25 NOTE — Telephone Encounter (Signed)
Noted, thanks!

## 2016-10-25 NOTE — Telephone Encounter (Signed)
I have spoken to patient's granddaughter Hubert Azure who is helping in her grandmother's care now. Mrs. Garmany was advised yesterday at her visit with oncology that she has around 6 months to live. No further intervention can be given. Hubert Azure states that at this time, Mrs. Vokes is refusing lactulose because of it's taste and associated nausea. Patient is needing Xifaxan twice daily which Dr Hilarie Fredrickson also agrees with. Patient needs this to prevent hepatic encephalopathy associated with cirrhosis, liver cancer. I have contacted [facility] which is where Xifaxan rx was sent on 10/12/16 and the prescription was $720.00 monthly but did not require prior authorization. I have also contacted [employer], patient's insurance to attempt a tier exemption for patient's medication. However, per Percell Miller, this medication is not eligible for tier exemption because it is a "tier 5 medication." I have requested that a copy of this information be sent to our fax so that we can attach this to patient assistance forms.

## 2016-10-26 ENCOUNTER — Telehealth: Payer: Self-pay

## 2016-10-26 ENCOUNTER — Telehealth: Payer: Self-pay | Admitting: Family Medicine

## 2016-10-26 ENCOUNTER — Encounter: Payer: Medicare Other | Admitting: Internal Medicine

## 2016-10-26 NOTE — Telephone Encounter (Signed)
Patient assistance forms have been faxed to [facility] Patient Assistance.

## 2016-10-26 NOTE — Telephone Encounter (Signed)
Patient daughter calling back stating that her # is [phone]

## 2016-10-26 NOTE — Telephone Encounter (Signed)
Cindy @ [facility] called to advise that they are discharging pt as of 10/12/16. Pt and husband feel that pt does not need any more nursing services at this time. [place] and will take that time to make some decisions about pt's future healthcare needs and consider Hospice referral request.   Dr. Sarajane Jews - [facility]. Thanks!

## 2016-10-26 NOTE — Telephone Encounter (Signed)
Spoke with pts daughter and she states that her Mother was not doing well earlier this morning. States she is doing better now since she has gotten some lactulose in her. Daughter knows to call back if she has any other questions or concerns.

## 2016-10-26 NOTE — Telephone Encounter (Signed)
kandis needs rx for stair lift chair fax to [phone]

## 2016-10-27 ENCOUNTER — Other Ambulatory Visit: Payer: Self-pay | Admitting: Family Medicine

## 2016-10-27 NOTE — Telephone Encounter (Signed)
I spoke with Kandis and faxed order to below number

## 2016-10-27 NOTE — Telephone Encounter (Signed)
I need a number for Kandis, do you remember what agency she is with?

## 2016-10-27 NOTE — Telephone Encounter (Signed)
Pt grand daughter Hubert Azure said the insurance will pay with order they can save sale taxes

## 2016-10-27 NOTE — Telephone Encounter (Signed)
It is very difficult to get equipment like this paid for, so I suggest we get her enrolled in Hospice first (I am happy to do the paperwork). This may facilitate getting the supplies faster

## 2016-10-27 NOTE — Telephone Encounter (Signed)
rx is ready

## 2016-10-27 NOTE — Telephone Encounter (Signed)
Call in #120 with 5 rf 

## 2016-10-30 ENCOUNTER — Telehealth: Payer: Self-pay | Admitting: Oncology

## 2016-10-30 ENCOUNTER — Ambulatory Visit (HOSPITAL_BASED_OUTPATIENT_CLINIC_OR_DEPARTMENT_OTHER): Payer: Medicare Other | Admitting: Oncology

## 2016-10-30 VITALS — BP 135/74 | HR 70 | Temp 98.9°F | Resp 18 | Ht 65.0 in | Wt 166.9 lb

## 2016-10-30 DIAGNOSIS — D649 Anemia, unspecified: Secondary | ICD-10-CM

## 2016-10-30 DIAGNOSIS — K7460 Unspecified cirrhosis of liver: Secondary | ICD-10-CM

## 2016-10-30 DIAGNOSIS — E119 Type 2 diabetes mellitus without complications: Secondary | ICD-10-CM | POA: Diagnosis not present

## 2016-10-30 DIAGNOSIS — C220 Liver cell carcinoma: Secondary | ICD-10-CM | POA: Diagnosis not present

## 2016-10-30 NOTE — Telephone Encounter (Signed)
Scheduled appt per 6/25 los - f/u on 7/25 - Gave patient AVS and calender,.

## 2016-10-30 NOTE — Progress Notes (Signed)
  [facility] OFFICE PROGRESS NOTE   Diagnosis: Hepatocellular carcinoma  INTERVAL HISTORY:   [name] [name] returns prior to a scheduled visit. She underwent a repeat MRI of the abdomen 10/24/2016 for follow-up of the [facility] ablation/chemotherapy embolization procedures.   The MRI confirmed changes of cirrhosis. An area of arterial hyperenhancement surrounding the segment 6 ablation defect has increased in size compared to 07/19/2016. An additional enlarging lesion is noted in segment 2. New tumor thrombus occludes the main right and left portal veins continuous with the infiltrative right liver lobe neoplasm. Stable pancreatic cystic lesion. Mild splenomegaly. Varices are unchanged. Enhancing soft tissue encases the inferior mesenteric artery and is stable. New ascites.  She denies pain. Her family report she has been more alert since starting rifaximin. She is asking about traveling to the beach.  Objective:  Vital signs in last 24 hours:  Blood pressure 135/74, pulse 70, temperature 98.9 F (37.2 C), temperature source Oral, resp. rate 18, height 5\' 5"  (1.651 m), weight 166 lb 14.4 oz (75.7 kg), SpO2 100 %.    HEENT: Neck without mass Resp: Decreased breath sounds with inspiratory rhonchi at the posterior base bilaterally, no respiratory distress Cardio: Regular rate and rhythm GI: No hepatosplenomegaly, mildly distended, no mass, nontender Vascular: No leg edema Neuro: Alert, follows commands, oriented to place, not month     Portacath/PICC-without erythema  Lab Results:  Lab Results  Component Value Date   WBC 4.4 10/23/2016   HGB 9.8 (L) 10/23/2016   HCT 30.9 (L) 10/23/2016   MCV 93.6 10/23/2016   PLT 166 10/23/2016   NEUTROABS 3.4 09/19/2016      Imaging:  No results found.  Medications: I have reviewed the patient's current medications.  Assessment/Plan: 1. NASH related cirrhosis  2. MRI liver 07/05/2015 with 3 hypervascular lesions concerning for  multifocal hepatocellular carcinoma  Ultrasound-guided biopsy of a right liver lesion on 07/19/2015-No [facility] or metastatic carcinoma  MRI liver 04/10/2016-enlargement of right hepatic lobe lesion with characteristics of HCC, 1.3 cm segment 6 lesion, resolution of a third lesion near the gallbladder fossa  Chemotherapy embolization of segment 6 [facility] 08/01/2016  Ablation of segment 6 [facility] 09/01/2016  MRI 10/24/2016-progression of infiltrative tumor in the right liver, enlarging left liver lesion, new tumor thrombus in the right and left portal veins, progressive ascites  3. Memory loss secondary to dementia versus hepatic encephalopathy  4. History of stage III right-sided breast cancer diagnosed in 2001  5. 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 Diabetes  7. Stage II (T3 N0), right colon, 2005  8. Removal of a benign greater curvature polyp 07/15/2015 9. Anemia -iron deficiency versus anemia of chronic disease versus myelodysplasia, improved  Upper endoscopy 10/11/2016-portal hypertensive gastropathy, mild nonbleeding gastritis    Disposition:  [name] has progressive multifocal hepatocellular carcinoma. No therapy will be curative. I discussed treatment options with Ms. Peffley and her family. She appears asymptomatic from the cancer, unless the lethargy is related. Her energy level has improved with treatment for hepatic encephalopathy.  She is at increased risk for toxicity with systemic therapy including sorafenib and immunotherapy.  She plans a family trip to the beach over the next few weeks. We decided to follow her with observation. She will return for an office visit in 2-3 weeks.  We will recommend she begin a trial of iron.  Donneta Romberg, MD  10/30/2016  9:58 AM

## 2016-10-31 NOTE — Telephone Encounter (Signed)
Correspondence received from [facility] Patient Assistance Program that patient has been approved for Xifaxan through program until 05/07/17. Medication will be shipped directly to patient's home address. Patient's granddaughter, Hubert Azure is advised. She states she actually called them yesterday and was advised of this as well.

## 2016-11-03 ENCOUNTER — Ambulatory Visit (HOSPITAL_COMMUNITY): Admit: 2016-11-03 | Payer: Medicare Other | Admitting: Internal Medicine

## 2016-11-03 ENCOUNTER — Encounter (HOSPITAL_COMMUNITY): Payer: Self-pay

## 2016-11-03 ENCOUNTER — Telehealth: Payer: Self-pay | Admitting: *Deleted

## 2016-11-03 SURGERY — ESOPHAGOGASTRODUODENOSCOPY (EGD) WITH PROPOFOL
Anesthesia: Monitor Anesthesia Care

## 2016-11-03 NOTE — Telephone Encounter (Addendum)
Called pt's husband, Dr. Benay Spice wanted to confirm pt is taking iron. Husband confirmed she is taking BID. Instructed him to increase to TID if tolerated. He voiced understanding. They are unable to come in sooner than 7[place]25 visit[place] are closing on a home they're selling on 7/16.

## 2016-11-03 NOTE — Telephone Encounter (Signed)
Husband called and said if it is important she see Dr Benay Spice on the 9th they can come home from the [place] for that appointment. Please call them to clarify.

## 2016-11-06 ENCOUNTER — Telehealth: Payer: Self-pay

## 2016-11-06 DIAGNOSIS — Z7982 Long term (current) use of aspirin: Secondary | ICD-10-CM | POA: Diagnosis not present

## 2016-11-06 DIAGNOSIS — I10 Essential (primary) hypertension: Secondary | ICD-10-CM | POA: Diagnosis not present

## 2016-11-06 DIAGNOSIS — Z8505 Personal history of malignant neoplasm of liver: Secondary | ICD-10-CM | POA: Diagnosis not present

## 2016-11-06 DIAGNOSIS — R918 Other nonspecific abnormal finding of lung field: Secondary | ICD-10-CM | POA: Diagnosis not present

## 2016-11-06 DIAGNOSIS — J984 Other disorders of lung: Secondary | ICD-10-CM | POA: Diagnosis not present

## 2016-11-06 DIAGNOSIS — R188 Other ascites: Secondary | ICD-10-CM | POA: Diagnosis not present

## 2016-11-06 DIAGNOSIS — K7460 Unspecified cirrhosis of liver: Secondary | ICD-10-CM | POA: Diagnosis not present

## 2016-11-06 DIAGNOSIS — E119 Type 2 diabetes mellitus without complications: Secondary | ICD-10-CM | POA: Diagnosis not present

## 2016-11-06 DIAGNOSIS — Z79899 Other long term (current) drug therapy: Secondary | ICD-10-CM | POA: Diagnosis not present

## 2016-11-06 DIAGNOSIS — R932 Abnormal findings on diagnostic imaging of liver and biliary tract: Secondary | ICD-10-CM | POA: Diagnosis not present

## 2016-11-06 NOTE — Telephone Encounter (Signed)
[name] [name] called her mother's abdomen is getting bigger. She has gained 2# since yesterday. She is not SOB, she is not uncomfortable, she still has an appetite.  S/w Dr Benay Spice and called [name] [name] back. They are at the beach. Per Dr Benay Spice if she is not symptomatic to do nothing but if gets symptoms to go to ER. When back in [place], instructed her to call her GI or her PCP if needed.  [name] [name] was appreciative of information.

## 2016-11-07 ENCOUNTER — Telehealth: Payer: Self-pay | Admitting: Family Medicine

## 2016-11-07 ENCOUNTER — Telehealth: Payer: Self-pay | Admitting: Internal Medicine

## 2016-11-07 DIAGNOSIS — D6489 Other specified anemias: Secondary | ICD-10-CM | POA: Diagnosis not present

## 2016-11-07 DIAGNOSIS — R188 Other ascites: Secondary | ICD-10-CM | POA: Diagnosis not present

## 2016-11-07 DIAGNOSIS — Z7982 Long term (current) use of aspirin: Secondary | ICD-10-CM | POA: Diagnosis not present

## 2016-11-07 DIAGNOSIS — Z9221 Personal history of antineoplastic chemotherapy: Secondary | ICD-10-CM | POA: Diagnosis not present

## 2016-11-07 DIAGNOSIS — M6281 Muscle weakness (generalized): Secondary | ICD-10-CM | POA: Diagnosis not present

## 2016-11-07 DIAGNOSIS — Z9010 Acquired absence of unspecified breast and nipple: Secondary | ICD-10-CM | POA: Diagnosis not present

## 2016-11-07 DIAGNOSIS — Z853 Personal history of malignant neoplasm of breast: Secondary | ICD-10-CM | POA: Diagnosis not present

## 2016-11-07 DIAGNOSIS — Z8551 Personal history of malignant neoplasm of bladder: Secondary | ICD-10-CM | POA: Diagnosis not present

## 2016-11-07 DIAGNOSIS — J9811 Atelectasis: Secondary | ICD-10-CM | POA: Diagnosis not present

## 2016-11-07 DIAGNOSIS — C220 Liver cell carcinoma: Secondary | ICD-10-CM | POA: Diagnosis not present

## 2016-11-07 DIAGNOSIS — I10 Essential (primary) hypertension: Secondary | ICD-10-CM | POA: Diagnosis not present

## 2016-11-07 DIAGNOSIS — Z85038 Personal history of other malignant neoplasm of large intestine: Secondary | ICD-10-CM | POA: Diagnosis not present

## 2016-11-07 DIAGNOSIS — K7581 Nonalcoholic steatohepatitis (NASH): Secondary | ICD-10-CM | POA: Diagnosis not present

## 2016-11-07 DIAGNOSIS — Z79899 Other long term (current) drug therapy: Secondary | ICD-10-CM | POA: Diagnosis not present

## 2016-11-07 DIAGNOSIS — Z9049 Acquired absence of other specified parts of digestive tract: Secondary | ICD-10-CM | POA: Diagnosis not present

## 2016-11-07 DIAGNOSIS — K7469 Other cirrhosis of liver: Secondary | ICD-10-CM | POA: Diagnosis not present

## 2016-11-07 DIAGNOSIS — K7460 Unspecified cirrhosis of liver: Secondary | ICD-10-CM | POA: Diagnosis not present

## 2016-11-07 DIAGNOSIS — Z794 Long term (current) use of insulin: Secondary | ICD-10-CM | POA: Diagnosis not present

## 2016-11-07 DIAGNOSIS — E119 Type 2 diabetes mellitus without complications: Secondary | ICD-10-CM | POA: Diagnosis not present

## 2016-11-07 NOTE — Telephone Encounter (Signed)
FYI

## 2016-11-07 NOTE — Telephone Encounter (Signed)
Pts daughter concerned because her mother is at a hospital that they don't know with doctors that they are not familiar with. Discussed with her that [facility] is on Epic and they should be able to see her records through care everywhere. Daughter felt a little better with this information.

## 2016-11-07 NOTE — Telephone Encounter (Signed)
The patient wanted [place] to give [place] a call to let us know that she was admitted to the hospital

## 2016-11-09 NOTE — Telephone Encounter (Signed)
D/C summary to Dr. Hilarie Fredrickson as requested.

## 2016-11-09 NOTE — Telephone Encounter (Signed)
Would like to have discharge summary once she is discharged

## 2016-11-10 ENCOUNTER — Telehealth: Payer: Self-pay | Admitting: Internal Medicine

## 2016-11-10 DIAGNOSIS — K7469 Other cirrhosis of liver: Secondary | ICD-10-CM

## 2016-11-10 NOTE — Telephone Encounter (Signed)
Pts daughter aware. Pt is still at the [place] they will come have labs done 11/21/16, order in epic.

## 2016-11-10 NOTE — Telephone Encounter (Signed)
Left message to call back.  Daughter called and states [facility] placed her mother back on lasix and aldactone. She wants to make sure Dr. Hilarie Fredrickson is ok with her taking these because she thinks he stopped them in the past. 2.5 liters of fluid were drawn off of her belly at the hospital. Asked her for the doses of the medications but she hasn't gotten them from the pharmacy and doesn't know. Please advise.

## 2016-11-10 NOTE — Telephone Encounter (Signed)
Given recurrent ascites I am okay with restarting diuretics Per the discharge summary she was started back on Lasix 20 mg and Aldactone 100 mg daily Her electrolytes and renal function will need to be checked within 1 week of resuming diuretics The ideal dose will likely be Lasix 40 mg and Aldactone 100 mg daily but will await renal function/basic metabolic panel first Low-sodium diet

## 2016-11-13 ENCOUNTER — Ambulatory Visit: Payer: Medicare Other | Admitting: Family Medicine

## 2016-11-14 ENCOUNTER — Ambulatory Visit: Payer: Medicare Other | Admitting: Physician Assistant

## 2016-11-19 ENCOUNTER — Encounter (HOSPITAL_COMMUNITY): Payer: Self-pay | Admitting: Emergency Medicine

## 2016-11-19 ENCOUNTER — Inpatient Hospital Stay (HOSPITAL_COMMUNITY)
Admission: EM | Admit: 2016-11-19 | Discharge: 2016-11-21 | DRG: 435 | Disposition: A | Discharge status: Home / Self Care | Payer: Medicare Other | Attending: Internal Medicine | Admitting: Internal Medicine

## 2016-11-19 ENCOUNTER — Other Ambulatory Visit: Payer: Self-pay

## 2016-11-19 ENCOUNTER — Emergency Department (HOSPITAL_COMMUNITY): Payer: Medicare Other

## 2016-11-19 DIAGNOSIS — G9340 Encephalopathy, unspecified: Secondary | ICD-10-CM | POA: Diagnosis not present

## 2016-11-19 DIAGNOSIS — K7581 Nonalcoholic steatohepatitis (NASH): Secondary | ICD-10-CM

## 2016-11-19 DIAGNOSIS — C220 Liver cell carcinoma: Secondary | ICD-10-CM | POA: Diagnosis not present

## 2016-11-19 DIAGNOSIS — Z808 Family history of malignant neoplasm of other organs or systems: Secondary | ICD-10-CM

## 2016-11-19 DIAGNOSIS — E1122 Type 2 diabetes mellitus with diabetic chronic kidney disease: Secondary | ICD-10-CM | POA: Diagnosis present

## 2016-11-19 DIAGNOSIS — Z8249 Family history of ischemic heart disease and other diseases of the circulatory system: Secondary | ICD-10-CM

## 2016-11-19 DIAGNOSIS — I5032 Chronic diastolic (congestive) heart failure: Secondary | ICD-10-CM | POA: Diagnosis present

## 2016-11-19 DIAGNOSIS — K7290 Hepatic failure, unspecified without coma: Secondary | ICD-10-CM | POA: Diagnosis present

## 2016-11-19 DIAGNOSIS — I10 Essential (primary) hypertension: Secondary | ICD-10-CM

## 2016-11-19 DIAGNOSIS — E871 Hypo-osmolality and hyponatremia: Secondary | ICD-10-CM | POA: Diagnosis present

## 2016-11-19 DIAGNOSIS — E43 Unspecified severe protein-calorie malnutrition: Secondary | ICD-10-CM | POA: Diagnosis present

## 2016-11-19 DIAGNOSIS — Z853 Personal history of malignant neoplasm of breast: Secondary | ICD-10-CM

## 2016-11-19 DIAGNOSIS — N179 Acute kidney failure, unspecified: Secondary | ICD-10-CM | POA: Diagnosis not present

## 2016-11-19 DIAGNOSIS — Z803 Family history of malignant neoplasm of breast: Secondary | ICD-10-CM

## 2016-11-19 DIAGNOSIS — Z9011 Acquired absence of right breast and nipple: Secondary | ICD-10-CM

## 2016-11-19 DIAGNOSIS — Z801 Family history of malignant neoplasm of trachea, bronchus and lung: Secondary | ICD-10-CM

## 2016-11-19 DIAGNOSIS — E669 Obesity, unspecified: Secondary | ICD-10-CM | POA: Diagnosis present

## 2016-11-19 DIAGNOSIS — K7460 Unspecified cirrhosis of liver: Secondary | ICD-10-CM | POA: Diagnosis present

## 2016-11-19 DIAGNOSIS — R188 Other ascites: Secondary | ICD-10-CM

## 2016-11-19 DIAGNOSIS — R740 Nonspecific elevation of levels of transaminase and lactic acid dehydrogenase [LDH]: Secondary | ICD-10-CM | POA: Diagnosis not present

## 2016-11-19 DIAGNOSIS — D649 Anemia, unspecified: Secondary | ICD-10-CM | POA: Diagnosis not present

## 2016-11-19 DIAGNOSIS — G4733 Obstructive sleep apnea (adult) (pediatric): Secondary | ICD-10-CM | POA: Diagnosis present

## 2016-11-19 DIAGNOSIS — R7401 Elevation of levels of liver transaminase levels: Secondary | ICD-10-CM | POA: Diagnosis present

## 2016-11-19 DIAGNOSIS — Z87891 Personal history of nicotine dependence: Secondary | ICD-10-CM

## 2016-11-19 DIAGNOSIS — R9431 Abnormal electrocardiogram [ECG] [EKG]: Secondary | ICD-10-CM | POA: Diagnosis not present

## 2016-11-19 DIAGNOSIS — I130 Hypertensive heart and chronic kidney disease with heart failure and stage 1 through stage 4 chronic kidney disease, or unspecified chronic kidney disease: Secondary | ICD-10-CM | POA: Diagnosis not present

## 2016-11-19 DIAGNOSIS — R410 Disorientation, unspecified: Secondary | ICD-10-CM

## 2016-11-19 DIAGNOSIS — E1169 Type 2 diabetes mellitus with other specified complication: Secondary | ICD-10-CM | POA: Diagnosis not present

## 2016-11-19 DIAGNOSIS — Z792 Long term (current) use of antibiotics: Secondary | ICD-10-CM

## 2016-11-19 DIAGNOSIS — Z6824 Body mass index (BMI) 24.0-24.9, adult: Secondary | ICD-10-CM

## 2016-11-19 DIAGNOSIS — Z800 Family history of malignant neoplasm of digestive organs: Secondary | ICD-10-CM

## 2016-11-19 DIAGNOSIS — Z8551 Personal history of malignant neoplasm of bladder: Secondary | ICD-10-CM

## 2016-11-19 DIAGNOSIS — Z833 Family history of diabetes mellitus: Secondary | ICD-10-CM

## 2016-11-19 DIAGNOSIS — J811 Chronic pulmonary edema: Secondary | ICD-10-CM | POA: Diagnosis not present

## 2016-11-19 DIAGNOSIS — K7469 Other cirrhosis of liver: Secondary | ICD-10-CM | POA: Diagnosis present

## 2016-11-19 DIAGNOSIS — Z7982 Long term (current) use of aspirin: Secondary | ICD-10-CM

## 2016-11-19 DIAGNOSIS — R4182 Altered mental status, unspecified: Secondary | ICD-10-CM | POA: Diagnosis not present

## 2016-11-19 DIAGNOSIS — N183 Chronic kidney disease, stage 3 (moderate): Secondary | ICD-10-CM | POA: Diagnosis present

## 2016-11-19 DIAGNOSIS — Z794 Long term (current) use of insulin: Secondary | ICD-10-CM

## 2016-11-19 DIAGNOSIS — Z807 Family history of other malignant neoplasms of lymphoid, hematopoietic and related tissues: Secondary | ICD-10-CM

## 2016-11-19 LAB — BRAIN NATRIURETIC PEPTIDE: B Natriuretic Peptide: 232.1 pg/mL — ABNORMAL HIGH (ref 0.0–100.0)

## 2016-11-19 LAB — CBC
HCT: 32.4 % — ABNORMAL LOW (ref 36.0–46.0)
Hemoglobin: 10.8 g/dL — ABNORMAL LOW (ref 12.0–15.0)
MCH: 30.4 pg (ref 26.0–34.0)
MCHC: 33.3 g/dL (ref 30.0–36.0)
MCV: 91.3 fL (ref 78.0–100.0)
PLATELETS: 256 10*3/uL (ref 150–400)
RBC: 3.55 MIL/uL — ABNORMAL LOW (ref 3.87–5.11)
RDW: 16.4 % — AB (ref 11.5–15.5)
WBC: 6.5 10*3/uL (ref 4.0–10.5)

## 2016-11-19 LAB — COMPREHENSIVE METABOLIC PANEL
ALK PHOS: 247 U/L — AB (ref 38–126)
ALT: 33 U/L (ref 14–54)
ANION GAP: 10 (ref 5–15)
AST: 93 U/L — AB (ref 15–41)
Albumin: 2.9 g/dL — ABNORMAL LOW (ref 3.5–5.0)
BILIRUBIN TOTAL: 2.1 mg/dL — AB (ref 0.3–1.2)
BUN: 18 mg/dL (ref 6–20)
CALCIUM: 9.1 mg/dL (ref 8.9–10.3)
CO2: 24 mmol/L (ref 22–32)
Chloride: 99 mmol/L — ABNORMAL LOW (ref 101–111)
Creatinine, Ser: 1.32 mg/dL — ABNORMAL HIGH (ref 0.44–1.00)
GFR calc Af Amer: 45 mL/min — ABNORMAL LOW (ref >60)
GFR calc non Af Amer: 38 mL/min — ABNORMAL LOW (ref >60)
GLUCOSE: 386 mg/dL — AB (ref 65–99)
Potassium: 3.5 mmol/L (ref 3.5–5.1)
Sodium: 133 mmol/L — ABNORMAL LOW (ref 135–145)
TOTAL PROTEIN: 7.8 g/dL (ref 6.5–8.1)

## 2016-11-19 LAB — TSH: TSH: 2.897 u[IU]/mL (ref 0.350–4.500)

## 2016-11-19 LAB — AMMONIA: AMMONIA: 41 umol/L — AB (ref 9–35)

## 2016-11-19 LAB — GLUCOSE, CAPILLARY
Glucose-Capillary: 368 mg/dL — ABNORMAL HIGH (ref 65–99)
Glucose-Capillary: 371 mg/dL — ABNORMAL HIGH (ref 65–99)

## 2016-11-19 LAB — PROTIME-INR
INR: 1.16
Prothrombin Time: 14.8 seconds (ref 11.4–15.2)

## 2016-11-19 MED ORDER — RIFAXIMIN 550 MG PO TABS
550.0000 mg | ORAL_TABLET | Freq: Two times a day (BID) | ORAL | Status: DC
  Administered 2016-11-19 – 2016-11-21 (×4): 550 mg via ORAL
  Filled 2016-11-19 (×4): qty 1

## 2016-11-19 MED ORDER — PANTOPRAZOLE SODIUM 40 MG PO TBEC
40.0000 mg | DELAYED_RELEASE_TABLET | Freq: Two times a day (BID) | ORAL | Status: DC
  Administered 2016-11-20 – 2016-11-21 (×3): 40 mg via ORAL
  Filled 2016-11-19 (×3): qty 1

## 2016-11-19 MED ORDER — CALCIUM CARBONATE-VITAMIN D 500-200 MG-UNIT PO TABS
1.0000 | ORAL_TABLET | Freq: Every day | ORAL | Status: DC
  Administered 2016-11-20 – 2016-11-21 (×2): 1 via ORAL
  Filled 2016-11-19 (×2): qty 1

## 2016-11-19 MED ORDER — LORATADINE 10 MG PO TABS
10.0000 mg | ORAL_TABLET | Freq: Every day | ORAL | Status: DC
  Administered 2016-11-20 – 2016-11-21 (×2): 10 mg via ORAL
  Filled 2016-11-19 (×2): qty 1

## 2016-11-19 MED ORDER — SODIUM CHLORIDE 0.9% FLUSH: 3.0000 mL | INTRAVENOUS | Status: DC | PRN

## 2016-11-19 MED ORDER — INSULIN ASPART 100 UNIT/ML ~~LOC~~ SOLN
4.0000 [IU] | Freq: Three times a day (TID) | SUBCUTANEOUS | Status: DC
  Administered 2016-11-20 – 2016-11-21 (×3): 4 [IU] via SUBCUTANEOUS

## 2016-11-19 MED ORDER — LACTULOSE 10 GM/15ML PO SOLN
20.0000 g | Freq: Three times a day (TID) | ORAL | Status: DC
  Administered 2016-11-19 – 2016-11-21 (×5): 20 g via ORAL
  Filled 2016-11-19 (×5): qty 30

## 2016-11-19 MED ORDER — MECLIZINE HCL 25 MG PO TABS: 25.0000 mg | ORAL_TABLET | ORAL | Status: DC | PRN

## 2016-11-19 MED ORDER — METOPROLOL TARTRATE 25 MG PO TABS
25.0000 mg | ORAL_TABLET | Freq: Two times a day (BID) | ORAL | Status: DC
  Administered 2016-11-19 – 2016-11-21 (×4): 25 mg via ORAL
  Filled 2016-11-19 (×4): qty 1

## 2016-11-19 MED ORDER — SODIUM CHLORIDE 0.9 % IV SOLN: 250.0000 mL | INTRAVENOUS | Status: DC | PRN

## 2016-11-19 MED ORDER — ONDANSETRON HCL 4 MG/2ML IJ SOLN: 4.0000 mg | Freq: Four times a day (QID) | INTRAMUSCULAR | Status: DC | PRN

## 2016-11-19 MED ORDER — TRAMADOL HCL 50 MG PO TABS: 50.0000 mg | ORAL_TABLET | ORAL | Status: DC | PRN

## 2016-11-19 MED ORDER — INSULIN ASPART 100 UNIT/ML ~~LOC~~ SOLN
0.0000 [IU] | Freq: Every day | SUBCUTANEOUS | Status: DC
  Administered 2016-11-19: 5 [IU] via SUBCUTANEOUS
  Administered 2016-11-20: 3 [IU] via SUBCUTANEOUS

## 2016-11-19 MED ORDER — ONDANSETRON HCL 4 MG PO TABS: 4.0000 mg | ORAL_TABLET | Freq: Four times a day (QID) | ORAL | Status: DC | PRN

## 2016-11-19 MED ORDER — INSULIN ASPART 100 UNIT/ML ~~LOC~~ SOLN
0.0000 [IU] | Freq: Three times a day (TID) | SUBCUTANEOUS | Status: DC
Start: 2016-11-20 — End: 2016-11-21
  Administered 2016-11-20: 11 [IU] via SUBCUTANEOUS
  Administered 2016-11-20: 8 [IU] via SUBCUTANEOUS
  Administered 2016-11-20 – 2016-11-21 (×2): 5 [IU] via SUBCUTANEOUS

## 2016-11-19 MED ORDER — HEPARIN SODIUM (PORCINE) 5000 UNIT/ML IJ SOLN
5000.0000 [IU] | Freq: Three times a day (TID) | INTRAMUSCULAR | Status: DC
  Administered 2016-11-19 – 2016-11-21 (×5): 5000 [IU] via SUBCUTANEOUS
  Filled 2016-11-19 (×5): qty 1

## 2016-11-19 MED ORDER — DIPHENHYDRAMINE HCL 25 MG PO CAPS
25.0000 mg | ORAL_CAPSULE | Freq: Every day | ORAL | Status: DC
  Administered 2016-11-19 – 2016-11-20 (×2): 25 mg via ORAL
  Filled 2016-11-19 (×2): qty 1

## 2016-11-19 MED ORDER — ASPIRIN EC 81 MG PO TBEC
81.0000 mg | DELAYED_RELEASE_TABLET | Freq: Every day | ORAL | Status: DC
  Administered 2016-11-20 – 2016-11-21 (×2): 81 mg via ORAL
  Filled 2016-11-19 (×2): qty 1

## 2016-11-19 MED ORDER — FERROUS SULFATE 325 (65 FE) MG PO TABS
325.0000 mg | ORAL_TABLET | Freq: Two times a day (BID) | ORAL | Status: DC
  Administered 2016-11-20 – 2016-11-21 (×3): 325 mg via ORAL
  Filled 2016-11-19 (×3): qty 1

## 2016-11-19 MED ORDER — SODIUM CHLORIDE 0.9% FLUSH
3.0000 mL | Freq: Two times a day (BID) | INTRAVENOUS | Status: DC
  Administered 2016-11-19 – 2016-11-21 (×4): 3 mL via INTRAVENOUS

## 2016-11-19 NOTE — ED Notes (Signed)
Patient request lab draw with IV start.

## 2016-11-19 NOTE — ED Notes (Signed)
Bed: FE76 Expected date:  Expected time:  Means of arrival:  Comments: TR 4

## 2016-11-19 NOTE — ED Notes (Signed)
Patient given water

## 2016-11-19 NOTE — ED Provider Notes (Signed)
[facility] DEPT Provider Note   CSN: [id number] Arrival date & time: 11/19/16  1815     History   Chief Complaint Chief Complaint  Patient presents with  . Altered Mental Status    HPI [name] [name] is a 76 y.o. female.  The history is provided by the patient.  Altered Mental Status   This is a recurrent problem. The current episode started yesterday. The problem has been gradually worsening. Associated symptoms include confusion. Risk factors: h/o liver cancer. Her past medical history is significant for liver disease.   States that he found her in the closet last night, starring off into space. Similar to prior AMS from hepatic encephalopathy.  Remainder of history, ROS, and physical exam limited due to patient's condition (AMS). Additional information was obtained from family.   Level V Caveat.   Past Medical History:  Diagnosis Date  . Adenocarcinoma, breast [facility])    sees Dr. Marcy Panning   . Allergy    environmental  . Arthritis    sees Dr. Hurley Cisco   . Barrett esophagus 12/20/11  . Bladder cancer ([place])   . Breast cancer ([place])   . Cancer, hepatocellular ([place])   . CHF (congestive heart failure) ([place])   . Dementia    mild  . Diabetes mellitus    sees Dr. Chalmers Cater   . Diverticulosis of colon (without mention of hemorrhage) 09/26/2004,09/10/2006  . Gastritis   . GERD (gastroesophageal reflux disease)   . Hemorrhoids   . Hepatic cirrhosis ([place])   . Hepatic steatosis   . Hiatal hernia   . Hyperglycemia   . Hyperlipidemia   . Hypertension   . IBS (irritable bowel syndrome)   . Inflammatory polyps of colon ([place])   . Iron deficiency anemia   . Malignant neoplasm of colon, unspecified site 07/08/2003  . Personal history of chemotherapy   . Personal history of radiation therapy   . Sleep apnea    sees Dr. Gwenette Greet   . Transaminase or LDH elevation    sees Dr. Zenovia Jarred     Patient Active Problem List   Diagnosis Date Noted  . Altered mental  status 09/14/2016  . Hypokalemia 08/21/2016  . Hypomagnesemia 08/21/2016  . Diarrhea 08/21/2016  . SIRS (systemic inflammatory response syndrome) ([place]) 08/20/2016  . Systemic inflammatory response syndrome (SIRS) ([place]) 08/19/2016  . AKI (acute kidney injury) ([place]) 08/19/2016  . Hepatocellular carcinoma ([place]) 08/01/2016  . Abnormal angiogram 08/01/2016  . Mild dementia 03/28/2016  . Mild cognitive impairment 12/22/2015  . Memory loss or impairment 06/22/2015  . Nausea with vomiting 02/07/2014  . Fatty liver 07/28/2013  . Barrett's esophagus 07/28/2013  . Acute on chronic diastolic HF (heart failure) ([place]) 07/10/2013  . Chest pain 07/08/2013  . Dyspnea on exertion 07/07/2013  . Exertional angina ([place]) 07/07/2013  . CHF (congestive heart failure) ([place]) 07/07/2013  . Lumbar radiculopathy 05/06/2013  . Anemia, unspecified 05/06/2013  . OSA (obstructive sleep apnea)- C-pap 02/04/2013  . Arthralgia of multiple joints 12/13/2012  . DIZZINESS 05/25/2010  . POSTHERPETIC NEURALGIA 10/26/2009  . GERD 05/18/2008  . HIATAL HERNIA 05/18/2008  . DIVERTICULITIS, HX OF 05/18/2008  . Essential hypertension 05/11/2008  . COLON CANCER, HX OF 05/11/2008  . SKIN CANCER, HX OF 05/11/2008  . Malignant neoplasm of central portion of right female breast ([place]) 04/25/2007  . BLADDER CANCER , UNSPEC. 04/25/2007  . Diabetes mellitus type 2 in obese ([place]) 04/25/2007  . HYPERLIPIDEMIA 04/25/2007  . DIVERTICULOSIS, COLON 08/13/2006  .  IBS 08/13/2006  . ELEVATION, TRANSAMINASE/LDH LEVELS 08/13/2006    Past Surgical History:  Procedure Laterality Date  . bladder cancer 2001    . BREAST BIOPSY    . BREAST SURGERY  2001   lymph nodes removed and rt mastectomy  . CARPAL TUNNEL RELEASE    . [place]   for diverticulitis  . COLONOSCOPY    . ESOPHAGEAL DILATION    . incisonal hernia    . IR GENERIC HISTORICAL  04/25/2016   IR RADIOLOGIST EVAL & MGMT 04/25/2016 [name], MD [facility]  INTERV RAD  . IR GENERIC HISTORICAL  08/01/2016   IR 3D INDEPENDENT WKST 08/01/2016 [facility]-INTERV RAD  . IR GENERIC HISTORICAL  08/01/2016   IR ANGIOGRAM VISCERAL SELECTIVE 08/01/2016 [facility]INTER[facility] RAD  . IR GENERIC HISTORICAL  08/01/2016   IR EMBO TUMOR ORGAN ISCHEMIA INFARCT INC GUIDE ROADMAPPING 08/01/2016 [facility]INTER[facility]  . IR GENERIC HISTORICAL  08/01/2016   IR US GUIDE VASC ACCESS RIGHT 08/01/2016 [facility]INTER[facility]  . IR GENERIC HISTORICAL  08/01/2016   IR ANGIOGRAM SELECTIVE EACH ADDITIONAL VESSEL 08/01/2016 [facility]INTER[facility]  . IR GENERIC HISTORICAL  08/01/2016   IR ANGIOGRAM SELECTIVE EACH ADDITIONAL VESSEL 08/01/2016 [facility]INTERV RAD  . IR RADIOLOGIST EVAL & MGMT  08/17/2016  . IR RADIOLOGIST EVAL & MGMT  07/19/2016  . MASTECTOMY    . mastectomy and biopsy    . RADIOFREQUENCY ABLATION N/A 09/01/2016   Procedure: MICROWAVE THERMAL ABLATION;  Surgeon: [name], MD;  Location: [facility] ORS;  Service: Anesthesiology;  Laterality: N/A;  . sigmoid colectomy 2005     for cancer  . squamous cell CA  R  calf  2008      OB History    No data available       Home Medications    Prior to Admission medications   Medication Sig Start Date End Date Taking? Authorizing Provider  aspirin EC 81 MG tablet Take 81 mg by mouth daily.     [provider]  Calcium Carbonate-Vitamin D (CALCIUM 600+D) 600-400 MG-UNIT tablet Take 1 tablet by mouth daily.    [provider]  diphenhydrAMINE (BENADRYL) 25 MG tablet Take 25 mg by mouth at bedtime.    [provider]  docusate sodium (COLACE) 100 MG capsule Take 100 mg by mouth daily as needed for mild constipation.    [provider]  ferrous sulfate 325 (65 FE) MG tablet Take 1 tablet (325 mg total) by mouth 3 (three) times daily with meals. Patient taking differently: Take 325 mg by mouth 2 (two) times daily with a meal.  09/20/16   [name], [name], PA-C  insulin NPH Human (HUMULIN N,NOVOLIN N) 100 UNIT/ML injection Inject 0.08 mLs (8 Units  total) into the skin 2 (two) times daily with a meal. 8 units in the morning and 8 units in the evening Patient taking differently: Inject 8 Units into the skin 2 (two) times daily with a meal. 8 units in the morning and 8 units in the evening  Husband states that she is receiving 20 units every AM and 20 units every PM 09/15/16   Regalado, [name], MD  lactulose (CHRONULAC) 10 GM/15ML solution Take 30 mLs (20 g total) by mouth 2 (two) times daily. 09/20/16   [name], [name], PA-C  loratadine (CLARITIN) 10 MG tablet Take 10 mg by mouth daily.    [provider]  meclizine (ANTIVERT) 25 MG tablet Take 1 tablet (25 mg total) by mouth every  4 (four) hours as needed for dizziness. 05/13/14   [name], MD  metoprolol tartrate (LOPRESSOR) 25 MG tablet TAKE 1 TABLET TWICE DAILY 04/24/16   Lorretta Harp, MD  metroNIDAZOLE (FLAGYL) 250 MG tablet Take 1 tablet (250 mg total) by mouth 3 (three) times daily. 10/06/16   [name], MD  Omega-3 Fatty Acids (FISH OIL PO) Take 1 capsule by mouth every evening.     [provider]  omeprazole (PRILOSEC) 20 MG capsule TAKE 1 CAPSULE TWICE DAILY BEFORE MEALS 06/22/16   [name], [name], MD  ondansetron (ZOFRAN-ODT) 4 MG disintegrating tablet Take 1 tablet (4 mg total) by mouth 3 (three) times daily as needed for nausea or vomiting. 10/25/16   [name], [name], MD  promethazine (PHENERGAN) 12.5 MG tablet Take 1 tablet (12.5 mg total) by mouth every 6 (six) hours as needed for nausea or vomiting. 10/25/16   [name], MD  rifaximin (XIFAXAN) 550 MG TABS tablet Take 1 tablet (550 mg total) by mouth 2 (two) times daily. 10/25/16   [name], [name], MD  traMADol Veatrice [place]) 50 MG tablet TAKE ONE TABLET BY MOUTH EVERY 4 HOURS AS NEEDED 10/27/16   [name], MD    Family History Family History  Problem Relation Age of Onset  . Diabetes Mother   . Skin cancer Mother   . Lung cancer Father   . Lymphoma Sister   . Hypertension Sister   . Breast  cancer Sister   . Diabetes Sister   . Hypertension Sister   . Breast cancer Sister   . Diabetes Sister   . Hypertension Sister   . Inflammatory bowel disease Daughter   . Esophageal cancer Son   . Stroke Neg Hx   . Colon cancer Neg Hx     Social History Social History  Substance Use Topics  . Smoking status: Former Smoker    Packs/day: 2.00    Years: 20.00    Types: Cigarettes    Quit date: 05/09/1975  . Smokeless tobacco: Never Used     Comment: smoked 1958-1977, up to 2 ppd  . Alcohol use No     Comment: rare     Allergies   Patient has no known allergies.   Review of Systems Review of Systems  Unable to perform ROS: Mental status change  Psychiatric/Behavioral: Positive for confusion.     Physical Exam Updated Vital Signs BP 137/77   Pulse 84   Temp 98.8 F (37.1 C) (Oral)   Resp 18   SpO2 98%   Physical Exam  Constitutional: She is oriented to person, place, and time. She appears well-developed and well-nourished. No distress.  HENT:  Head: Normocephalic and atraumatic.  Nose: Nose normal.  Eyes: Pupils are equal, round, and reactive to light. Conjunctivae and EOM are normal. Right eye exhibits no discharge. Left eye exhibits no discharge. No scleral icterus.  Neck: Normal range of motion. Neck supple.  Cardiovascular: Normal rate and regular rhythm.  Exam reveals no gallop and no friction rub.   No murmur heard. Pulmonary/Chest: Effort normal and breath sounds normal. No stridor. No respiratory distress. She has no rales.  Abdominal: Soft. She exhibits no distension. There is no tenderness.  Musculoskeletal: She exhibits no edema or tenderness.  Neurological: She is alert and oriented to person, place, and time.  Moves all extremities  Skin: Skin is warm and dry. No rash noted. She is not diaphoretic. No erythema.  Psychiatric: She has a  normal mood and affect.  Vitals reviewed.    ED Treatments / Results  Labs (all labs ordered are listed, but  only abnormal results are displayed) Labs Reviewed  CBC - Abnormal; Notable for the following:       Result Value   RBC 3.55 (*)    Hemoglobin 10.8 (*)    HCT 32.4 (*)    RDW 16.4 (*)    All other components within normal limits  GLUCOSE, CAPILLARY - Abnormal; Notable for the following:    Glucose-Capillary 368 (*)    All other components within normal limits  COMPREHENSIVE METABOLIC PANEL  AMMONIA  CBG MONITORING, ED    EKG  EKG Interpretation None       Radiology Dg Chest 2 View  Result Date: 11/19/2016 CLINICAL DATA:  Altered mental status.  Liver disease. EXAM: CHEST  2 VIEW COMPARISON:  09/13/2016 FINDINGS: AP upright and lateral views. Borderline cardiomegaly with aortic atherosclerosis. No aneurysm. Mild interstitial edema is noted without pneumonic consolidation, effusion or pneumothorax. Degenerative changes are seen along the dorsal spine. No acute nor suspicious osseous abnormalities. IMPRESSION: Mild interstitial edema. Electronically Signed   By: [name] [name] M.D.   On: 11/19/2016 19:21    Procedures Procedures (including critical care time)  Medications Ordered in ED Medications - No data to display   Initial Impression / Assessment and Plan / ED Course  I have reviewed the triage vital signs and the nursing notes.  Pertinent labs & imaging results that were available during my care of the patient were reviewed by me and considered in my medical decision making (see chart for details).     Concerning for hepatic encephalopathy. No infectious process identified on history or exam. No reported trauma requiring imaging at this time. On review of records patient had a recent MRI of the brain which did not reveal evidence of metastatic disease. Patient has hyperglycemia all labs and no evidence of DKA.   Discussed case with hospitalist who will admit the patient for further workup and management.  Final Clinical Impressions(s) / ED Diagnoses   Final  diagnoses:  Disorientation      Leonette Monarch Grayce Sessions, MD 11/19/16 2351

## 2016-11-19 NOTE — ED Triage Notes (Signed)
Pt with hx liver disease brought by family for progressive altered mental status x 36 hours, today has not been swallowing her saliva, needs reminding to swallow, once reminded appears to swallow without difficulty.

## 2016-11-19 NOTE — H&P (Signed)
History and Physical    [name] [name] VQQ:[id number] DOB: [birth year] DOA: 11/19/2016  PCP: [name], MD   Patient coming from: Home   Chief Complaint: Confusion   HPI: [name] [name] is a 76 y.o. female with medical history significant for insulin-dependent diabetes mellitus, hypertension, hepatic cirrhosis with hepatocellular carcinoma, and chronic iron deficiency anemia, now presenting to the emergency department for evaluation of increased confusion for the past 1-2 days. Patient is accompanied by her daughter who assists with the history. Patient had been admitted to the hospital several weeks ago under similar circumstances and her condition was attributed to hepatic encephalopathy at that time. Since returning home, she has been taking rifaximin and had been doing well with this until yesterday when she was noted to be increasingly confused and lethargic. Family called her gastroenterologist for advice today and it was recommended that she start back on lactulose, taking it every couple hours until her condition improves. Family reports that she has been taking the lactulose as directed and has had roughly 8 soft bowel movements over the past 24 hours, but her mental status has failed to clear. There has not been any reported trauma or falls recently, no alcohol use, no fevers or chills, no chest pain or palpitations, and no cough or dyspnea. Patient denies headache, change in vision or hearing, or focal numbness or weakness.   ED Course: Upon arrival to the ED, patient is found to be afebrile, saturating well on room air, and with vital signs otherwise stable. EKG features sinus rhythm with QTc interval of 560 ms. Chest x-ray is notable for mild interstitial edema. Chemistry panel features a sodium of 133, glucose 386, and a creatinine 1.32, up from an apparent baseline of roughly 1.0. Alkaline phosphatase, AST, and total bilirubin are slightly higher than on priors. Ammonia level is  slightly elevated at 41. CBC features and improved normocytic anemia with hemoglobin of 10.8. Urinalysis remains pending. Hospitalists were asked to admit for acute encephalopathy. Patient remained clinically stable in the ED and no apparent respiratory distress and will be observed on medical-surgical unit for ongoing evaluation and management of acute encephalopathy.  Review of Systems:  All other systems reviewed and apart from HPI, are negative.  Past Medical History:  Diagnosis Date  . Adenocarcinoma, breast [facility])    sees Dr. Marcy Panning   . Allergy    environmental  . Arthritis    sees Dr. Hurley Cisco   . Barrett esophagus 12/20/11  . Bladder cancer ([place])   . Breast cancer ([place])   . Cancer, hepatocellular ([place])   . CHF (congestive heart failure) ([place])   . Dementia    mild  . Diabetes mellitus    sees Dr. Chalmers Cater   . Diverticulosis of colon (without mention of hemorrhage) 09/26/2004,09/10/2006  . Gastritis   . GERD (gastroesophageal reflux disease)   . Hemorrhoids   . Hepatic cirrhosis ([place])   . Hepatic steatosis   . Hiatal hernia   . Hyperglycemia   . Hyperlipidemia   . Hypertension   . IBS (irritable bowel syndrome)   . Inflammatory polyps of colon ([place])   . Iron deficiency anemia   . Malignant neoplasm of colon, unspecified site 07/08/2003  . Personal history of chemotherapy   . Personal history of radiation therapy   . Sleep apnea    sees Dr. Gwenette Greet   . Transaminase or LDH elevation    sees Dr. Zenovia Jarred     Past Surgical  History:  Procedure Laterality Date  . bladder cancer 2001    . BREAST BIOPSY    . BREAST SURGERY  2001   lymph nodes removed and rt mastectomy  . CARPAL TUNNEL RELEASE    . [place]   for diverticulitis  . COLONOSCOPY    . ESOPHAGEAL DILATION    . incisonal hernia    . IR GENERIC HISTORICAL  04/25/2016   IR RADIOLOGIST EVAL & MGMT 04/25/2016 [name], MD [facility] INTERV RAD  . IR GENERIC HISTORICAL  08/01/2016    IR 3D INDEPENDENT [facility]ST 08/01/2016 [facility]  . IR GENERIC HISTORICAL  08/01/2016   IR ANGIOGRAM VISCERAL SELECTIVE 08/01/2016 [facility]  . IR GENERIC HISTORICAL  08/01/2016   IR EMBO TUMOR ORGAN ISCHEMIA INFARCT INC GUIDE ROADMAPPING 08/01/2016 [facility]  . IR GENERIC HISTORICAL  08/01/2016   IR US GUIDE VASC ACCESS RIGHT 08/01/2016 [facility]  . IR GENERIC HISTORICAL  08/01/2016   IR ANGIOGRAM SELECTIVE EACH ADDITIONAL VESSEL 08/01/2016 [facility]  . IR GENERIC HISTORICAL  08/01/2016   IR ANGIOGRAM SELECTIVE EACH ADDITIONAL VESSEL 08/01/2016 [facility]  . IR RADIOLOGIST EVAL & MGMT  08/17/2016  . IR RADIOLOGIST EVAL & MGMT  07/19/2016  . MASTECTOMY    . mastectomy and biopsy    . RADIOFREQUENCY ABLATION N/A 09/01/2016   Procedure: MICROWAVE THERMAL ABLATION;  Surgeon: [name], MD;  Location: [facility] ORS;  Service: Anesthesiology;  Laterality: N/A;  . sigmoid colectomy 2005     for cancer  . squamous cell CA  R  cal[place]  2008       reports that she quit smoking about 41 years ago. Her smoking use included Cigarettes. She has a 40.00 pack-year smoking history. She has never used smokeless tobacco. She reports that she does not drink alcohol or use drugs.  No Known Allergies  Family History  Problem Relation Age of Onset  . Diabetes Mother   . Skin cancer Mother   . Lung cancer Father   . Lymphoma Sister   . Hypertension Sister   . Breast cancer Sister   . Diabetes Sister   . Hypertension Sister   . Breast cancer Sister   . Diabetes Sister   . Hypertension Sister   . Inflammatory bowel disease Daughter   . Esophageal cancer Son   . Stroke Neg Hx   . Colon cancer Neg Hx      Prior to Admission medications   Medication Sig Start Date End Date Taking? Authorizing Provider  aspirin EC 81 MG tablet Take 81 mg by mouth daily.     [provider]  Calcium Carbonate-Vitamin D (CALCIUM 600+D) 600-400 MG-UNIT tablet Take 1 tablet by mouth daily.    [provider]  diphenhydrAMINE (BENADRYL) 25 MG tablet Take 25 mg by mouth at bedtime.    [provider]  docusate sodium (COLACE) 100 MG capsule Take 100 mg by mouth daily as needed for mild constipation.    [provider]  ferrous sulfate 325 (65 FE) MG tablet Take 1 tablet (325 mg total) by mouth 3 (three) times daily with meals. Patient taking differently: Take 325 mg by mouth 2 (two) times daily with a meal.  09/20/16   [name], [name], PA-C  insulin NPH Human (HUMULIN N,NOVOLIN N) 100 UNIT/ML injection Inject 0.08 mLs (8 Units total) into the skin 2 (two) times daily with a meal. 8 units in the morning and 8 units in the  evening Patient taking differently: Inject 8 Units into the skin 2 (two) times daily with a meal. 8 units in the morning and 8 units in the evening  Husband states that she is receiving 20 units every AM and 20 units every PM 09/15/16   Regalado, [name], MD  lactulose (CHRONULAC) 10 GM/15ML solution Take 30 mLs (20 g total) by mouth 2 (two) times daily. 09/20/16   [name], [name], PA-C  loratadine (CLARITIN) 10 MG tablet Take 10 mg by mouth daily.    [provider]  meclizine (ANTIVERT) 25 MG tablet Take 1 tablet (25 mg total) by mouth every 4 (four) hours as needed for dizziness. 05/13/14   [name], MD  metoprolol tartrate (LOPRESSOR) 25 MG tablet TAKE 1 TABLET TWICE DAILY 04/24/16   Lorretta Harp, MD  metroNIDAZOLE (FLAGYL) 250 MG tablet Take 1 tablet (250 mg total) by mouth 3 (three) times daily. 10/06/16   [name], MD  Omega-3 Fatty Acids (FISH OIL PO) Take 1 capsule by mouth every evening.     [provider]  omeprazole (PRILOSEC) 20 MG capsule TAKE 1 CAPSULE TWICE DAILY BEFORE MEALS 06/22/16   [name], [name], MD  ondansetron (ZOFRAN-ODT) 4 MG disintegrating tablet Take 1 tablet (4 mg total) by mouth 3 (three) times daily as needed for nausea or vomiting. 10/25/16   [name], [name], MD  promethazine (PHENERGAN) 12.5 MG  tablet Take 1 tablet (12.5 mg total) by mouth every 6 (six) hours as needed for nausea or vomiting. 10/25/16   [name], MD  rifaximin (XIFAXAN) 550 MG TABS tablet Take 1 tablet (550 mg total) by mouth 2 (two) times daily. 10/25/16   [name], [name], MD  traMADol (ULTRAM) 50 MG tablet TAKE ONE TABLET BY MOUTH EVERY 4 HOURS AS NEEDED 10/27/16   [name], MD    Physical Exam: Vitals:   11/19/16 1831 11/19/16 2000  BP: 137/77 127/66  Pulse: 84 82  Resp: 18 16  Temp: 98.8 F (37.1 C)   TempSrc: Oral   SpO2: 98% 96%      Constitutional: NAD, calm, comfortable Eyes: PERTLA, lids and conjunctivae normal ENMT: Mucous membranes are moist. Posterior pharynx clear of any exudate or lesions.   Neck: normal, supple, no masses, no thyromegaly Respiratory: clear to auscultation bilaterally, no wheezing, no crackles. Normal respiratory effort.   Cardiovascular: S1 & S2 heard, regular rate and rhythm. No extremity edema. No significant JVD. Abdomen: Mild distension, soft, no tenderness. Bowel sounds active.  Musculoskeletal: no clubbing / cyanosis. No joint deformity upper and lower extremities.  Skin: no significant rashes, lesions, ulcers. Warm, dry, well-perfused. Neurologic: CN 2-12 grossly intact. Sensation intact, DTR normal. Strength 5/5 in all 4 limbs.  Psychiatric: Alert and oriented to person, place, situation, and year. Calm and cooperative.    Labs on Admission: I have personally reviewed following labs and imaging studies  CBC:  Recent Labs Lab 11/19/16 1940  WBC 6.5  HGB 10.8*  HCT 32.4*  MCV 91.3  PLT 629   Basic Metabolic Panel:  Recent Labs Lab 11/19/16 1940  NA 133*  K 3.5  CL 99*  CO2 24  GLUCOSE 386*  BUN 18  CREATININE 1.32*  CALCIUM 9.1   GFR: CrCl cannot be calculated (Unknown ideal weight.). Liver Function Tests:  Recent Labs Lab 11/19/16 1940  AST 93*  ALT 33  ALKPHOS 247*  BILITOT 2.1*  PROT 7.8  ALBUMIN 2.9*   No results  for input(s): LIPASE, AMYLASE in the last 168 hours.  Recent Labs Lab 11/19/16 1940  AMMONIA 41*   Coagulation Profile: No results for input(s): INR, PROTIME in the last 168 hours. Cardiac Enzymes: No results for input(s): CKTOTAL, CKMB, CKMBINDEX, TROPONINI in the last 168 hours. BNP (last 3 results) No results for input(s): PROBNP in the last 8760 hours. HbA1C: No results for input(s): HGBA1C in the last 72 hours. CBG:  Recent Labs Lab 11/19/16 1945  GLUCAP 368*   Lipid Profile: No results for input(s): CHOL, HDL, LDLCALC, TRIG, CHOLHDL, LDLDIRECT in the last 72 hours. Thyroid Function Tests: No results for input(s): TSH, T4TOTAL, FREET4, T3FREE, THYROIDAB in the last 72 hours. Anemia Panel: No results for input(s): VITAMINB12, FOLATE, FERRITIN, TIBC, IRON, RETICCTPCT in the last 72 hours. Urine analysis:    Component Value Date/Time   COLORURINE YELLOW 09/14/2016 0042   APPEARANCEUR HAZY (A) 09/14/2016 0042   LABSPEC 1.015 09/14/2016 0042   PHURINE 7.0 09/14/2016 0042   GLUCOSEU 50 (A) 09/14/2016 0042   HGBUR NEGATIVE 09/14/2016 0042   HGBUR negative 07/17/2008 1328   BILIRUBINUR NEGATIVE 09/14/2016 [place] 09/14/2016 0042   PROTEINUR NEGATIVE 09/14/2016 0042   UROBILINOGEN 1.0 04/27/2009 2108   NITRITE NEGATIVE 09/14/2016 0042   LEUKOCYTESUR TRACE (A) 09/14/2016 0042   Sepsis Labs: @LABRCNTIP (procalcitonin:4,lacticidven:4) )No results found for this or any previous visit (from the past 240 hour(s)).   Radiological Exams on Admission: Dg Chest 2 View  Result Date: 11/19/2016 CLINICAL DATA:  Altered mental status.  Liver disease. EXAM: CHEST  2 VIEW COMPARISON:  09/13/2016 FINDINGS: AP upright and lateral views. Borderline cardiomegaly with aortic atherosclerosis. No aneurysm. Mild interstitial edema is noted without pneumonic consolidation, effusion or pneumothorax. Degenerative changes are seen along the dorsal spine. No acute nor suspicious  osseous abnormalities. IMPRESSION: Mild interstitial edema. Electronically Signed   By: Ashley Royalty M.D.   On: 11/19/2016 19:21    EKG: Independently reviewed. Sinus rhythm, QTc 516 ms.   Assessment/Plan  1. Acute encephalopathy  - Pt presents with increasing confusion over the preceding 1-2 days  - No focal deficits on exam, no recent fall or trauma, no infectious s/s  - ED workup notable for slight elevation in ammonia, mild AKI, slight increase in AST and bilirubin  - At the direction of her gastroenterologist, she has resumed taking lactulose in addition to rifaximin, has had ~8 soft BM's over the past day, but has failed to improve   - Hepatic etiology seems most likely, but ammonia only minimally elevated and she has been taking lactulose with frequent BM's  - Plan to continue lactulose and rifaximin, check TSH, B12, folate, RPR    2. Cirrhosis, hepatocellular carcinoma  - Pt is followed by GI and oncology  - Has known esophageal varices, no hx of SBP  - She continues to take Lopressor, no diuretics, reports having therapeutic paracentesis a couple weeks ago - Continue PPI, rifaximin, lactulose   3. Acute kidney injury  - SCr is 1.32 on admission, up from apparent baseline of ~1.0  - Possibly prerenal and secondary to frequent soft BM's  - Hesitant to start IVF with her ascites and apparent edema on CXR - Avoid nephrotoxins where feasible, repeat chem panel in am    4. Insulin-dependent DM  - A1c was 6.1% in April '18  - Managed at home with regular insulin 8 units BID  - Serum glucose 386 on admission  - Check CBG's with meals  and qHS  - Start Novolog 4 units with meals and per moderate-intensity correctional    5. Chronic diastolic CHF  - Appears roughly euvolemic on admission, but noted to have possible edema on CXR, has abdominal ascites  - Grade 2 diastolic dysfunction and no significant valvular disease on TTE from March 2015  - Continue beta-blocker as tolerated     6. Hypertension  - BP at goal  - Continue Lopressor as tolerated    7. Normocytic anemia  - Hgb is 10.8 on admission, better than recent priors - Continue iron-supplementation   8. Hyponatremia  - Serum sodium is 133 on admission, possibly secondary to cirrhosis or CHF, but recently wnl, will trend    DVT prophylaxis: sq heparin  Code Status: Full  Family Communication: Daughter updated at bedside Disposition Plan: Observe on med-surg  Consults called: None Admission status: Observation    Vianne Bulls, MD [facility] Pager [phone]  If 7PM-7AM, please contact night-coverage [email or web address] Password [email or web address]  11/19/2016, 9:11 PM

## 2016-11-19 NOTE — ED Notes (Signed)
Hospitalist at bedside 

## 2016-11-20 ENCOUNTER — Observation Stay (HOSPITAL_COMMUNITY): Payer: Medicare Other

## 2016-11-20 DIAGNOSIS — E43 Unspecified severe protein-calorie malnutrition: Secondary | ICD-10-CM | POA: Diagnosis present

## 2016-11-20 DIAGNOSIS — Z794 Long term (current) use of insulin: Secondary | ICD-10-CM | POA: Diagnosis not present

## 2016-11-20 DIAGNOSIS — Z8551 Personal history of malignant neoplasm of bladder: Secondary | ICD-10-CM | POA: Diagnosis not present

## 2016-11-20 DIAGNOSIS — Z808 Family history of malignant neoplasm of other organs or systems: Secondary | ICD-10-CM | POA: Diagnosis not present

## 2016-11-20 DIAGNOSIS — D649 Anemia, unspecified: Secondary | ICD-10-CM | POA: Diagnosis present

## 2016-11-20 DIAGNOSIS — Z8249 Family history of ischemic heart disease and other diseases of the circulatory system: Secondary | ICD-10-CM | POA: Diagnosis not present

## 2016-11-20 DIAGNOSIS — R188 Other ascites: Secondary | ICD-10-CM | POA: Diagnosis present

## 2016-11-20 DIAGNOSIS — E871 Hypo-osmolality and hyponatremia: Secondary | ICD-10-CM | POA: Diagnosis present

## 2016-11-20 DIAGNOSIS — K7469 Other cirrhosis of liver: Secondary | ICD-10-CM | POA: Diagnosis present

## 2016-11-20 DIAGNOSIS — Z800 Family history of malignant neoplasm of digestive organs: Secondary | ICD-10-CM | POA: Diagnosis not present

## 2016-11-20 DIAGNOSIS — Z9011 Acquired absence of right breast and nipple: Secondary | ICD-10-CM | POA: Diagnosis not present

## 2016-11-20 DIAGNOSIS — G9340 Encephalopathy, unspecified: Secondary | ICD-10-CM | POA: Diagnosis not present

## 2016-11-20 DIAGNOSIS — Z833 Family history of diabetes mellitus: Secondary | ICD-10-CM | POA: Diagnosis not present

## 2016-11-20 DIAGNOSIS — Z801 Family history of malignant neoplasm of trachea, bronchus and lung: Secondary | ICD-10-CM | POA: Diagnosis not present

## 2016-11-20 DIAGNOSIS — C220 Liver cell carcinoma: Secondary | ICD-10-CM | POA: Diagnosis present

## 2016-11-20 DIAGNOSIS — Z853 Personal history of malignant neoplasm of breast: Secondary | ICD-10-CM | POA: Diagnosis not present

## 2016-11-20 DIAGNOSIS — Z7982 Long term (current) use of aspirin: Secondary | ICD-10-CM | POA: Diagnosis not present

## 2016-11-20 DIAGNOSIS — Z803 Family history of malignant neoplasm of breast: Secondary | ICD-10-CM | POA: Diagnosis not present

## 2016-11-20 DIAGNOSIS — Z807 Family history of other malignant neoplasms of lymphoid, hematopoietic and related tissues: Secondary | ICD-10-CM | POA: Diagnosis not present

## 2016-11-20 DIAGNOSIS — K7460 Unspecified cirrhosis of liver: Secondary | ICD-10-CM | POA: Diagnosis not present

## 2016-11-20 DIAGNOSIS — I130 Hypertensive heart and chronic kidney disease with heart failure and stage 1 through stage 4 chronic kidney disease, or unspecified chronic kidney disease: Secondary | ICD-10-CM | POA: Diagnosis present

## 2016-11-20 DIAGNOSIS — N183 Chronic kidney disease, stage 3 (moderate): Secondary | ICD-10-CM | POA: Diagnosis present

## 2016-11-20 DIAGNOSIS — E1122 Type 2 diabetes mellitus with diabetic chronic kidney disease: Secondary | ICD-10-CM | POA: Diagnosis present

## 2016-11-20 DIAGNOSIS — Z87891 Personal history of nicotine dependence: Secondary | ICD-10-CM | POA: Diagnosis not present

## 2016-11-20 DIAGNOSIS — I5032 Chronic diastolic (congestive) heart failure: Secondary | ICD-10-CM | POA: Diagnosis present

## 2016-11-20 DIAGNOSIS — N179 Acute kidney failure, unspecified: Secondary | ICD-10-CM | POA: Diagnosis present

## 2016-11-20 LAB — COMPREHENSIVE METABOLIC PANEL
ALK PHOS: 215 U/L — AB (ref 38–126)
ALT: 29 U/L (ref 14–54)
AST: 81 U/L — ABNORMAL HIGH (ref 15–41)
Albumin: 2.5 g/dL — ABNORMAL LOW (ref 3.5–5.0)
Anion gap: 8 (ref 5–15)
BUN: 17 mg/dL (ref 6–20)
CALCIUM: 9.2 mg/dL (ref 8.9–10.3)
CO2: 24 mmol/L (ref 22–32)
CREATININE: 1.12 mg/dL — AB (ref 0.44–1.00)
Chloride: 104 mmol/L (ref 101–111)
GFR calc non Af Amer: 47 mL/min — ABNORMAL LOW (ref >60)
GFR, EST AFRICAN AMERICAN: 54 mL/min — AB (ref >60)
Glucose, Bld: 259 mg/dL — ABNORMAL HIGH (ref 65–99)
Potassium: 3.6 mmol/L (ref 3.5–5.1)
Sodium: 136 mmol/L (ref 135–145)
Total Bilirubin: 1.7 mg/dL — ABNORMAL HIGH (ref 0.3–1.2)
Total Protein: 6.7 g/dL (ref 6.5–8.1)

## 2016-11-20 LAB — CBC WITH DIFFERENTIAL/PLATELET
BASOS PCT: 1 %
Basophils Absolute: 0 10*3/uL (ref 0.0–0.1)
EOS ABS: 0.2 10*3/uL (ref 0.0–0.7)
EOS PCT: 4 %
HCT: 30.9 % — ABNORMAL LOW (ref 36.0–46.0)
HEMOGLOBIN: 10.2 g/dL — AB (ref 12.0–15.0)
Lymphocytes Relative: 18 %
Lymphs Abs: 1 10*3/uL (ref 0.7–4.0)
MCH: 29.9 pg (ref 26.0–34.0)
MCHC: 33 g/dL (ref 30.0–36.0)
MCV: 90.6 fL (ref 78.0–100.0)
MONOS PCT: 13 %
Monocytes Absolute: 0.7 10*3/uL (ref 0.1–1.0)
NEUTROS PCT: 64 %
Neutro Abs: 3.5 10*3/uL (ref 1.7–7.7)
PLATELETS: 200 10*3/uL (ref 150–400)
RBC: 3.41 MIL/uL — ABNORMAL LOW (ref 3.87–5.11)
RDW: 16.6 % — ABNORMAL HIGH (ref 11.5–15.5)
WBC: 5.5 10*3/uL (ref 4.0–10.5)

## 2016-11-20 LAB — URINALYSIS, ROUTINE W REFLEX MICROSCOPIC
BILIRUBIN URINE: NEGATIVE
Glucose, UA: 150 mg/dL — AB
Hgb urine dipstick: NEGATIVE
KETONES UR: 5 mg/dL — AB
Leukocytes, UA: NEGATIVE
NITRITE: NEGATIVE
PROTEIN: NEGATIVE mg/dL
SPECIFIC GRAVITY, URINE: 1.025 (ref 1.005–1.030)
pH: 5 (ref 5.0–8.0)

## 2016-11-20 LAB — GLUCOSE, CAPILLARY
GLUCOSE-CAPILLARY: 275 mg/dL — AB (ref 65–99)
Glucose-Capillary: 215 mg/dL — ABNORMAL HIGH (ref 65–99)
Glucose-Capillary: 306 mg/dL — ABNORMAL HIGH (ref 65–99)
Glucose-Capillary: 282 mg/dL — ABNORMAL HIGH (ref 65–99)

## 2016-11-20 LAB — VITAMIN B12: VITAMIN B 12: 1095 pg/mL — AB (ref 180–914)

## 2016-11-20 LAB — FOLATE: FOLATE: 15.9 ng/mL (ref >5.9)

## 2016-11-20 MED ORDER — INSULIN NPH (HUMAN) (ISOPHANE) 100 UNIT/ML ~~LOC~~ SUSP
15.0000 [IU] | Freq: Two times a day (BID) | SUBCUTANEOUS | Status: DC
  Administered 2016-11-20 – 2016-11-21 (×2): 15 [IU] via SUBCUTANEOUS
  Filled 2016-11-20: qty 10

## 2016-11-20 MED ORDER — BOOST PLUS PO LIQD
237.0000 mL | Freq: Three times a day (TID) | ORAL | Status: DC
  Administered 2016-11-20: 237 mL via ORAL
  Filled 2016-11-20 (×4): qty 237

## 2016-11-20 NOTE — Progress Notes (Signed)
Initial Nutrition Assessment  DOCUMENTATION CODES:   Severe malnutrition in context of acute illness/injury  INTERVENTION:   Provide Boost Plus chocolate- Each supplement provides 360kcal and 14g protein.   RD will continue to monitor  NUTRITION DIAGNOSIS:   Malnutrition (severe) related to acute illness, cancer and cancer related treatments as evidenced by percent weight loss, mild depletion of body fat, moderate depletions of muscle mass.  GOAL:   Patient will meet greater than or equal to 90% of their needs  MONITOR:   PO intake, Supplement acceptance, Labs, Weight trends, I & O's  REASON FOR ASSESSMENT:   Malnutrition Screening Tool   ASSESSMENT:    76 y.o. female with medical history significant for insulin-dependent diabetes mellitus, hypertension, hepatic cirrhosis with hepatocellular carcinoma, and chronic iron deficiency anemia, now presenting to the emergency department for evaluation of increased confusion for the past 1-2 days.   Patient's daughter reports pt has had increased confusion over the past couple of days. Pt ate 100% of breakfast this morning. Pt last seen 5/10 by nutrition staff. Pt drank Boost Plus at that time. RD will order.  Per chart review, pt has lost 17 lb since 6/25 (10% wt loss x 3 weeks, significant for time frame). Nutrition-Focused physical exam completed. Findings are mild/moderate fat depletion, mild/moderate muscle depletion, and no edema.   Medications: OSCAL -D tablet daily,Ferrous sulfate tablet BID, Protonix tablet BID,  Labs reviewed: CBGs: 215-306 GFR: 47  Diet Order:  Diet Heart Room service appropriate? Yes; Fluid consistency: Thin; Fluid restriction: 1500 mL Fluid  Skin:  Reviewed, no issues  Last BM:  PTA  Height:   Ht Readings from Last 1 Encounters:  11/19/16 5\' 5"  (1.651 m)    Weight:   Wt Readings from Last 1 Encounters:  11/19/16 149 lb 4 oz (67.7 kg)    Ideal Body Weight:  56.8 kg  BMI:  Body mass index  is 24.84 kg/m.  Estimated Nutritional Needs:   Kcal:  1700-1900  Protein:  75-85g  Fluid:  1.9L/day  EDUCATION NEEDS:   No education needs identified at this time  Clayton Bibles, MS, RD, LDN Pager: [phone] After Hours Pager: [phone]

## 2016-11-20 NOTE — Care Management Note (Signed)
Case Management Note  Patient Details  Name: KOLBIE CLARKSTON MRN: [id number] Date of Birth: June 25, 1940  Subjective/Objective:           Confusion, elevated ammonia levels abnormal chemistry         Action/Plan: Date:  November 20, 2016  Chart reviewed for concurrent status and case management needs.  Will continue to follow patient progress.  Discharge Planning: following for needs  Expected discharge date: [id number]  Velva Harman, BSN, [place], [place]   Expected Discharge Date:                  Expected Discharge Plan:  Home/Self Care  In-House Referral:     Discharge planning Services  CM Consult  Post Acute Care Choice:    Choice offered to:     DME Arranged:    DME Agency:     HH Arranged:    HH Agency:     Status of Service:  In process, will continue to follow  If discussed at Long Length of Stay Meetings, dates discussed:    Additional Comments:  Leeroy Cha, RN 11/20/2016, 9:08 AM

## 2016-11-20 NOTE — Progress Notes (Signed)
PROGRESS NOTE  [name] [name] MLY:[id number] DOB: [birth year] DOA: 11/19/2016 PCP: [name], MD  HPI/Recap of past 24 hours:  Still some confusion, denies pain, no n/v, family at bedside, report patient has not return to baseline yet  Assessment/Plan: Principal Problem:   Acute encephalopathy Active Problems:   Diabetes mellitus type 2 in obese ([place])   Essential hypertension   ELEVATION, TRANSAMINASE/LDH LEVELS   OSA (obstructive sleep apnea)- C-pap   Anemia, unspecified   Chronic diastolic CHF (congestive heart failure) ([facility])   Hepatocellular carcinoma ([facility])   AKI (acute kidney injury) ([place])   Other cirrhosis of liver ([place])   1. Acute encephalopathy  - Pt presents with increasing confusion over the preceding 1-2 days  - No focal deficits on exam, no recent fall or trauma, no infectious s/s  - ED workup notable for slight elevation in ammonia, mild AKI, slight increase in AST and bilirubin  - At the direction of her gastroenterologist, she has resumed taking lactulose in addition to rifaximin, has had ~8 soft BM's over the past day, but has failed to improve   - Hepatic etiology seems most likely, but ammonia only minimally elevated and she has been taking lactulose with frequent BM's  - Plan to continue lactulose and rifaximin, TSH, B12, folate unremarkable, RPR /hiv pending   2. Cirrhosis, hepatocellular carcinoma  - Pt is followed by GI and oncology  - Has known esophageal varices, no hx of SBP  - She continues to take Lopressor, no diuretics, reports having therapeutic paracentesis a couple weeks ago - Continue PPI, rifaximin, lactulose  -repeat abdominal US today with minimal ascites, abdominal exam benign  3. Acute kidney injury  - SCr is 1.32 on admission, up from apparent baseline of ~1.0  - Possibly prerenal and secondary to frequent soft BM's  - Hesitant to start IVF with her ascites and apparent edema on CXR - Avoid nephrotoxins where feasible, repeat  chem panel in am   -encourage oral intake, ua is concentrated but no infection  4. Insulin-dependent DM  - A1c was 6.1% in April '18  - Managed at home with regular insulin 8 units BID  - Serum glucose 386 on admission  - Check CBG's with meals and qHS  - Start Novolog 4 units with meals and per moderate-intensity correctional    5. Chronic diastolic CHF  - Appears roughly euvolemic on admission, but noted to have possible edema on CXR, has abdominal ascites  - Grade 2 diastolic dysfunction and no significant valvular disease on TTE from March 2015  - Continue beta-blocker as tolerated    6. Hypertension  - BP at goal  - Continue Lopressor as tolerated    7. Normocytic anemia  - Hgb is 10.8 on admission, better than recent priors - Continue iron-supplementation   8. Hyponatremia  - Serum sodium is 133 on admission, possibly secondary to cirrhosis or CHF, but recently wnl, will trend    DVT prophylaxis: sq heparin  Code Status: Full  Family Communication: Daughter updated at bedside Disposition Plan: not ready for discharge yet,  Consults called: None   Procedures:  none  Antibiotics:  Rifaximin (home meds)   Objective: BP 134/69 (BP Location: Left Arm)   Pulse 78   Temp 98.7 F (37.1 C) (Oral)   Resp 16   Ht 5\' 5"  (1.651 m)   Wt 67.7 kg (149 lb 4 oz)   SpO2 97%   BMI 24.84 kg/m   Intake/Output Summary (Last  24 hours) at 11/20/16 0756 Last data filed at 11/20/16 0600  Gross per 24 hour  Intake              240 ml  Output                0 ml  Net              240 ml   Filed Weights   11/19/16 2158  Weight: 67.7 kg (149 lb 4 oz)    Exam:   General:  Slight confusion  Cardiovascular: RRR  Respiratory: CTABL  Abdomen: Soft/ND/NT, positive BS  Musculoskeletal: No Edema  Neuro: slight confusion, no focal deficit  Data Reviewed: Basic Metabolic Panel:  Recent Labs Lab 11/19/16 1940 11/20/16 0504  NA 133* 136  K 3.5 3.6  CL 99*  104  CO2 24 24  GLUCOSE 386* 259*  BUN 18 17  CREATININE 1.32* 1.12*  CALCIUM 9.1 9.2   Liver Function Tests:  Recent Labs Lab 11/19/16 1940 11/20/16 0504  AST 93* 81*  ALT 33 29  ALKPHOS 247* 215*  BILITOT 2.1* 1.7*  PROT 7.8 6.7  ALBUMIN 2.9* 2.5*   No results for input(s): LIPASE, AMYLASE in the last 168 hours.  Recent Labs Lab 11/19/16 1940  AMMONIA 41*   CBC:  Recent Labs Lab 11/19/16 1940 11/20/16 0504  WBC 6.5 5.5  NEUTROABS  --  3.5  HGB 10.8* 10.2*  HCT 32.4* 30.9*  MCV 91.3 90.6  PLT 256 200   Cardiac Enzymes:   No results for input(s): CKTOTAL, CKMB, CKMBINDEX, TROPONINI in the last 168 hours. BNP (last 3 results)  Recent Labs  11/19/16 2024  BNP 232.1*    ProBNP (last 3 results) No results for input(s): PROBNP in the last 8760 hours.  CBG:  Recent Labs Lab 11/19/16 1945 11/19/16 2141 11/20/16 0746  GLUCAP 368* 371* 215*    No results found for this or any previous visit (from the past 240 hour(s)).   Studies: Dg Chest 2 View  Result Date: 11/19/2016 CLINICAL DATA:  Altered mental status.  Liver disease. EXAM: CHEST  2 VIEW COMPARISON:  09/13/2016 FINDINGS: AP upright and lateral views. Borderline cardiomegaly with aortic atherosclerosis. No aneurysm. Mild interstitial edema is noted without pneumonic consolidation, effusion or pneumothorax. Degenerative changes are seen along the dorsal spine. No acute nor suspicious osseous abnormalities. IMPRESSION: Mild interstitial edema. Electronically Signed   By: Ashley Royalty M.D.   On: 11/19/2016 19:21    Scheduled Meds: . aspirin EC  81 mg Oral Daily  . calcium-vitamin D  1 tablet Oral Daily  . diphenhydrAMINE  25 mg Oral QHS  . ferrous sulfate  325 mg Oral BID WC  . heparin  5,000 Units Subcutaneous Q8H  . insulin aspart  0-15 Units Subcutaneous TID WC  . insulin aspart  0-5 Units Subcutaneous QHS  . insulin aspart  4 Units Subcutaneous TID WC  . lactulose  20 g Oral TID  .  loratadine  10 mg Oral Daily  . metoprolol tartrate  25 mg Oral BID  . pantoprazole  40 mg Oral BID AC  . rifaximin  550 mg Oral BID  . sodium chloride flush  3 mL Intravenous Q12H    Continuous Infusions: . sodium chloride       Time spent: 89mins  Meli Faley MD, PhD  [facility] Pager [phone]. If 7PM-7AM, please contact night-coverage at [email or web address], password [facility] 11/20/2016, 7:56 AM  LOS: 0 days

## 2016-11-21 LAB — CBC WITH DIFFERENTIAL/PLATELET
Basophils Absolute: 0.1 10*3/uL (ref 0.0–0.1)
Basophils Relative: 1 %
Eosinophils Absolute: 0.2 10*3/uL (ref 0.0–0.7)
Eosinophils Relative: 4 %
HEMATOCRIT: 34.6 % — AB (ref 36.0–46.0)
Hemoglobin: 11.2 g/dL — ABNORMAL LOW (ref 12.0–15.0)
LYMPHS ABS: 1.1 10*3/uL (ref 0.7–4.0)
LYMPHS PCT: 17 %
MCH: 29.9 pg (ref 26.0–34.0)
MCHC: 32.4 g/dL (ref 30.0–36.0)
MCV: 92.5 fL (ref 78.0–100.0)
Monocytes Absolute: 0.7 10*3/uL (ref 0.1–1.0)
Monocytes Relative: 10 %
NEUTROS ABS: 4.3 10*3/uL (ref 1.7–7.7)
Neutrophils Relative %: 68 %
Platelets: 222 10*3/uL (ref 150–400)
RBC: 3.74 MIL/uL — AB (ref 3.87–5.11)
RDW: 16.5 % — ABNORMAL HIGH (ref 11.5–15.5)
WBC: 6.4 10*3/uL (ref 4.0–10.5)

## 2016-11-21 LAB — AMMONIA: AMMONIA: 39 umol/L — AB (ref 9–35)

## 2016-11-21 LAB — COMPREHENSIVE METABOLIC PANEL
ALK PHOS: 256 U/L — AB (ref 38–126)
ALT: 38 U/L (ref 14–54)
AST: 104 U/L — AB (ref 15–41)
Albumin: 2.7 g/dL — ABNORMAL LOW (ref 3.5–5.0)
Anion gap: 9 (ref 5–15)
BILIRUBIN TOTAL: 1.7 mg/dL — AB (ref 0.3–1.2)
BUN: 18 mg/dL (ref 6–20)
CALCIUM: 10 mg/dL (ref 8.9–10.3)
CHLORIDE: 99 mmol/L — AB (ref 101–111)
CO2: 25 mmol/L (ref 22–32)
CREATININE: 1.19 mg/dL — AB (ref 0.44–1.00)
GFR calc Af Amer: 50 mL/min — ABNORMAL LOW (ref >60)
GFR, EST NON AFRICAN AMERICAN: 44 mL/min — AB (ref >60)
Glucose, Bld: 230 mg/dL — ABNORMAL HIGH (ref 65–99)
Potassium: 3.9 mmol/L (ref 3.5–5.1)
Sodium: 133 mmol/L — ABNORMAL LOW (ref 135–145)
TOTAL PROTEIN: 7.6 g/dL (ref 6.5–8.1)

## 2016-11-21 LAB — RPR: RPR Ser Ql: NONREACTIVE

## 2016-11-21 LAB — GLUCOSE, CAPILLARY: Glucose-Capillary: 210 mg/dL — ABNORMAL HIGH (ref 65–99)

## 2016-11-21 LAB — HIV ANTIBODY (ROUTINE TESTING W REFLEX): HIV Screen 4th Generation wRfx: NONREACTIVE

## 2016-11-21 MED ORDER — BOOST PLUS PO LIQD: 237.0000 mL | Freq: Three times a day (TID) | ORAL | 0 refills | Status: AC

## 2016-11-21 MED ORDER — NADOLOL 20 MG PO TABS: 20.0000 mg | ORAL_TABLET | Freq: Every day | ORAL | Status: DC

## 2016-11-21 MED ORDER — INSULIN ASPART 100 UNIT/ML FLEXPEN: PEN_INJECTOR | SUBCUTANEOUS | 0 refills | Status: DC

## 2016-11-21 NOTE — Discharge Summary (Addendum)
Discharge Summary  [name] [name] LFY:[id number] DOB: [birth year]  PCP: [name], MD  Admit date: 11/19/2016 Discharge date: 11/21/2016  Time spent: >41mins, more than 50% time spent on coordination of care and patient/family counseling   Recommendations for Outpatient Follow-up:  1. F/u with PMD within a week  for hospital discharge follow up, repeat cbc/bmp at follow up 2. F/u with GI 3. F/u with oncology  Discharge Diagnoses:  Active Hospital Problems   Diagnosis Date Noted  . Acute encephalopathy 11/19/2016  . Encephalopathy 11/20/2016  . Other cirrhosis of liver ([place]) 11/19/2016  . AKI (acute kidney injury) ([place]) 08/19/2016  . Hepatocellular carcinoma ([place]) 08/01/2016  . Chronic diastolic CHF (congestive heart failure) ([place]) 07/10/2013  . Anemia, unspecified 05/06/2013  . OSA (obstructive sleep apnea)- C-pap 02/04/2013  . Essential hypertension 05/11/2008  . Diabetes mellitus type 2 in obese ([place]) 04/25/2007  . ELEVATION, TRANSAMINASE/LDH LEVELS 08/13/2006    Resolved Hospital Problems   Diagnosis Date Noted Date Resolved  No resolved problems to display.    Discharge Condition: stable  Diet recommendation: carb modified  Filed Weights   11/19/16 2158 11/21/16 8527  Weight: 67.7 kg (149 lb 4 oz) 71.4 kg (157 lb 6.5 oz)    History of present illness:  PCP: [name], MD   Patient coming from: Home   Chief Complaint: Confusion   HPI: [name] [name] is a 76 y.o. female with medical history significant for insulin-dependent diabetes mellitus, hypertension, hepatic cirrhosis with hepatocellular carcinoma, and chronic iron deficiency anemia, now presenting to the emergency department for evaluation of increased confusion for the past 1-2 days. Patient is accompanied by her daughter who assists with the history. Patient had been admitted to the hospital several weeks ago under similar circumstances and her condition was attributed to hepatic encephalopathy  at that time. Since returning home, she has been taking rifaximin and had been doing well with this until yesterday when she was noted to be increasingly confused and lethargic. Family called her gastroenterologist for advice today and it was recommended that she start back on lactulose, taking it every couple hours until her condition improves. Family reports that she has been taking the lactulose as directed and has had roughly 8 soft bowel movements over the past 24 hours, but her mental status has failed to clear. There has not been any reported trauma or falls recently, no alcohol use, no fevers or chills, no chest pain or palpitations, and no cough or dyspnea. Patient denies headache, change in vision or hearing, or focal numbness or weakness.   ED Course: Upon arrival to the ED, patient is found to be afebrile, saturating well on room air, and with vital signs otherwise stable. EKG features sinus rhythm with QTc interval of 560 ms. Chest x-ray is notable for mild interstitial edema. Chemistry panel features a sodium of 133, glucose 386, and a creatinine 1.32, up from an apparent baseline of roughly 1.0. Alkaline phosphatase, AST, and total bilirubin are slightly higher than on priors. Ammonia level is slightly elevated at 41. CBC features and improved normocytic anemia with hemoglobin of 10.8. Urinalysis remains pending. Hospitalists were asked to admit for acute encephalopathy. Patient remained clinically stable in the ED and no apparent respiratory distress and will be observed on medical-surgical unit for ongoing evaluation and management of acute encephalopathy.   Hospital Course:  Principal Problem:   Acute encephalopathy Active Problems:   Diabetes mellitus type 2 in obese [facility])   Essential hypertension  ELEVATION, TRANSAMINASE/LDH LEVELS   OSA (obstructive sleep apnea)- C-pap   Anemia, unspecified   Chronic diastolic CHF (congestive heart failure) ([facility])   Hepatocellular carcinoma  ([facility])   AKI (acute kidney injury) ([place])   Other cirrhosis of liver ([facility])   Encephalopathy   1. Acute encephalopathy  - Pt presents with increasing confusion over the preceding 1-2 days  - No focal deficits on exam, no recent fall or trauma, no infectious s/s  - ED workup notable for slight elevation in ammonia, mild AKI, slight increase in AST and bilirubin  - At the direction of her gastroenterologist, she has resumed taking lactulose in addition to rifaximin, has had ~8 soft BM's over the past day, but has failed to improve  - Hepatic etiology seems most likely, but ammonia only minimally elevated and she has been taking lactulose with frequent BM's  - Plan to continue lactulose and rifaximin, TSH, B12, folate unremarkable, HIV negative, RPR pending -she has improved, per family she is back to her baseline, she is still slow to answer questions but better oriented , she states it is June 2018, she is oriented to place and person at discharge.  2. Cirrhosis, hepatocellular carcinoma  - Pt is followed by GI and oncology  - Has known esophageal varices, no hx of SBP  - she reports having therapeutic paracentesis 2 weeks ago - Continue PPI, rifaximin, lactulose, nadalol,  -lasix, spironolactone held since admission, resumed at discharge. -repeat abdominal US on 7/17 with minimal ascites, abdominal exam benign  3. Acute kidney injury  On CKD III - SCr is 1.32 on admission, up from apparent baseline of ~1.0  - Possibly prerenal and secondary to frequent soft BM's  - Hesitant to start IVF with her ascites and apparent edema on CXR - Avoid nephrotoxins where feasible -encourage oral intake, ua is concentrated but no infection -cr 1.19 at discharge -pmd to follow up.  4. Insulin-dependent DM  - A1c was 6.1% in April '18  -  Patient's blood sugar has been elevated for the last few weeks at home, highest around 400, per family report -family report patient likes to drink "Dr Malachi Bonds  ", need diet control. -lactulose could also contribute  -home meds humulin N 25units bid, added novolog sliding scale, -Patient to follow up with pmd for diabetes  management   5. Chronic diastolic CHF  - Appears roughly euvolemic on admission, but noted to have possible edema on CXR, has abdominal ascites  - Grade 2 diastolic dysfunction and no significant valvular disease on TTE from March 2015  - Continue beta-blocker, she is recently started on lasxi/spironolactone which is continued   6. Hypertension  - BP at goal  - Lopressor discontinued, he is continued on nadolol  7. Normocytic anemia  - Hgb is 10.8 on admission, better than recent priors - Continue iron-supplementation   8. Hyponatremia  - Serum sodium is 133 on admission, possibly secondary to cirrhosis or CHF, but recently wnl -pmd to continue to follow  9. Severe malnutrition in context of acute illness/injury Nutrition consulted, input appreciated, per nutrition "pt has lost 17 lb since 6/25 (10% wt loss x 3 weeks, significant for time frame). Nutrition-Focused physical exam completed. Findings are mild/moderate fat depletion, mild/moderate muscle depletion, and no edema. "   DVT prophylaxis while in the hospital:sq heparin  Code Status:Full  Family Communication:husband and Daughter updated at bedside Disposition Plan:discharge home Consults called:None   Procedures:  none  Antibiotics:  Rifaximin (home meds)  Discharge Exam: BP 138/66 (BP Location: Left Arm)   Pulse 73   Temp 98.7 F (37.1 C) (Oral)   Resp 18   Ht 5\' 5"  (1.651 m)   Wt 71.4 kg (157 lb 6.5 oz)   SpO2 95%   BMI 26.19 kg/m   General: * Cardiovascular: * Respiratory: *  Discharge Instructions You were cared for by a hospitalist during your hospital stay. If you have any questions about your discharge medications or the care you received while you were in the hospital after you are discharged, you can call the unit  and asked to speak with the hospitalist on call if the hospitalist that took care of you is not available. Once you are discharged, your primary care physician will handle any further medical issues. Please note that NO REFILLS for any discharge medications will be authorized once you are discharged, as it is imperative that you return to your primary care physician (or establish a relationship with a primary care physician if you do not have one) for your aftercare needs so that they can reassess your need for medications and monitor your lab values.  Discharge Instructions    Diet - low sodium heart healthy    Complete by:  As directed    Carb modified   Increase activity slowly    Complete by:  As directed      Allergies as of 11/21/2016   No Known Allergies     Medication List    TAKE these medications   aspirin EC 81 MG tablet Take 81 mg by mouth daily.   CALCIUM 600+D 600-400 MG-UNIT tablet Generic drug:  Calcium Carbonate-Vitamin D Take 1 tablet by mouth daily.   diphenhydrAMINE 25 MG tablet Commonly known as:  BENADRYL Take 25 mg by mouth at bedtime.   docusate sodium 100 MG capsule Commonly known as:  COLACE Take 100 mg by mouth daily as needed for mild constipation.   ferrous sulfate 325 (65 FE) MG tablet Take 1 tablet (325 mg total) by mouth 3 (three) times daily with meals. What changed:  when to take this   furosemide 40 MG tablet Commonly known as:  LASIX Take 20 mg by mouth 3 (three) times daily.   insulin aspart 100 UNIT/ML FlexPen Commonly known as:  NOVOLOG FLEXPEN Insulin sliding scale: Blood sugar  120-150   3units                       151-200   4units                       201-250   7units                       251- 300  11units                       301-350   15uints                       351-400   20units                       >400         call MD immediately   insulin NPH Human 100 UNIT/ML injection Commonly known as:  HUMULIN N,NOVOLIN N Inject  0.08 mLs (8 Units total) into the skin 2 (two) times daily with  a meal. 8 units in the morning and 8 units in the evening What changed:  how much to take  when to take this  additional instructions   lactose free nutrition Liqd Take 237 mLs by mouth 3 (three) times daily with meals.   lactulose 10 GM/15ML solution Commonly known as:  CHRONULAC Take 30 mLs (20 g total) by mouth 2 (two) times daily.   loratadine 10 MG tablet Commonly known as:  CLARITIN Take 10 mg by mouth daily.   meclizine 25 MG tablet Commonly known as:  ANTIVERT Take 1 tablet (25 mg total) by mouth every 4 (four) hours as needed for dizziness.   nadolol 20 MG tablet Commonly known as:  CORGARD Take 20 mg by mouth daily.   omega-3 acid ethyl esters 1 g capsule Commonly known as:  LOVAZA Take 1 g by mouth at bedtime.   omeprazole 20 MG capsule Commonly known as:  PRILOSEC TAKE 1 CAPSULE TWICE DAILY BEFORE MEALS   ondansetron 4 MG disintegrating tablet Commonly known as:  ZOFRAN-ODT Take 1 tablet (4 mg total) by mouth 3 (three) times daily as needed for nausea or vomiting.   promethazine 12.5 MG tablet Commonly known as:  PHENERGAN Take 1 tablet (12.5 mg total) by mouth every 6 (six) hours as needed for nausea or vomiting.   rifaximin 550 MG Tabs tablet Commonly known as:  XIFAXAN Take 1 tablet (550 mg total) by mouth 2 (two) times daily.   spironolactone 50 MG tablet Commonly known as:  ALDACTONE Take 50 mg by mouth 2 (two) times daily.   traMADol 50 MG tablet Commonly known as:  ULTRAM TAKE ONE TABLET BY MOUTH EVERY 4 HOURS AS NEEDED What changed:  See the new instructions.      No Known Allergies Follow-up Information    [name], MD Follow up in 1 week(s).   Specialty:  Family Medicine Why:  hospital discharge follow up, repeat cbc/bmp at follow up Contact information: [place] [address] [phone]        Jerene Bears, MD Follow up in 2  week(s).   Specialty:  Gastroenterology Why:  hepatic encephalopathy, ascites  Contact information: [address] [place] [address] [phone]            The results of significant diagnostics from this hospitalization (including imaging, microbiology, ancillary and laboratory) are listed below for reference.    Significant Diagnostic Studies: Dg Chest 2 View  Result Date: 11/19/2016 CLINICAL DATA:  Altered mental status.  Liver disease. EXAM: CHEST  2 VIEW COMPARISON:  09/13/2016 FINDINGS: AP upright and lateral views. Borderline cardiomegaly with aortic atherosclerosis. No aneurysm. Mild interstitial edema is noted without pneumonic consolidation, effusion or pneumothorax. Degenerative changes are seen along the dorsal spine. No acute nor suspicious osseous abnormalities. IMPRESSION: Mild interstitial edema. Electronically Signed   By: Ashley Royalty M.D.   On: 11/19/2016 19:21   Mr Abdomen Wwo Contrast  Result Date: 10/24/2016 CLINICAL DATA:  Segment 6 right liver lobe hepatocellular carcinoma status post DEB-TACE 08/01/2016 and percutaneous thermal ablation 09/01/2016, presenting for restaging. EXAM: MRI ABDOMEN WITHOUT AND WITH CONTRAST TECHNIQUE: Multiplanar multisequence MR imaging of the abdomen was performed both before and after the administration of intravenous contrast. CONTRAST:  16mL EOVIST GADOXETATE DISODIUM 0.25 MOL/L IV SOLN COMPARISON:  07/19/2016 MRI abdomen. FINDINGS: Lower chest: Patchy reticular opacities at both lung bases, not appreciably changed since 07/21/2016 CT. No acute abnormality at the lung bases. Hepatobiliary:  Liver surface is diffusely markedly irregular with mild relative hypertrophy of the lateral segment left liver lobe, compatible with cirrhosis. No hepatic steatosis. There is a large irregular region of arterial phase hyperenhancement throughout the right liver lobe surrounding the segment 6 ablation defect measuring up to the 9.4 x 8.8 cm  (series 801/image 41), with associated restricted diffusion and delayed phase washout and capsular enhancement, significantly increased from 3.8 x 3.1 cm on 07/19/2016, compatible with significant progression of now widely infiltrative hepatocellular carcinoma. There is a 1.3 x 0.9 cm segment 2 left liver dome mass (series 17/image 25) with arterial phase hyperenhancement, subtle contrast washout and hepatobiliary phase hypoenhancement, increased from 0.6 x 0.6 cm on 07/19/2016 MRI, compatible with a LI-RADS category 5 lesion. There is bulky new expansile tumor thrombus occluding the main, right and left portal veins, which is continuous with the infiltrative right liver lobe neoplasm. Nondistended gallbladder. No cholelithiasis. Moderate diffuse gallbladder wall thickening. No biliary ductal dilatation. Common bile duct diameter 4 mm. No choledocholithiasis. Pancreas: Stable nonenhancing 1.6 x 1.3 cm pancreatic neck cystic lesion (series 14/ image 31), previously 1.6 x 1.2 cm, without wall thickening or internal complexity. No additional pancreatic lesions. No pancreatic duct dilation. No pancreas divisum. Spleen: Mild splenomegaly (craniocaudal splenic length 14.9 cm, previously 13.0 cm), increased. Adrenals/Urinary Tract: No discrete adrenal nodules. No hydronephrosis. Normal kidneys with no renal mass. Stomach/Bowel: Grossly normal stomach. Visualized small and large bowel is normal caliber, with no bowel wall thickening. Vascular/Lymphatic: Atherosclerotic nonaneurysmal abdominal aorta. Stable enhancing soft tissue encasing the inferior mesenteric artery measuring up to 3.5 x 2.1 cm (series 803/ image 83). Patent hepatic, splenic and renal veins. Stable mildly enlarged 1.0 cm right pericardiophrenic node. Small to moderate paraumbilical varices and mild gastroesophageal varices are not appreciably changed. Other: Small to moderate volume ascites is largely new. No focal fluid collections. Musculoskeletal: No  aggressive appearing focal osseous lesions. IMPRESSION: 1. Significant progression of multifocal hepatocellular carcinoma, which is now widely infiltrative throughout the right liver lobe, with additional growth of a small segment 2 left liver lobe HCC. 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 2. Cirrhosis. Small to moderate volume ascites, new. Mild splenomegaly, increased. Stable small to moderate paraumbilical and mild gastroesophageal varices. 3. New moderate diffuse gallbladder wall thickening without cholelithiasis, probably due to noninflammatory edema . 4. Stable 1.6 cm pancreatic neck cystic lesion without high risk MRI features. 5. Stable nonspecific enhancing soft tissue encasing the IMA, favor inflammatory. 6. Aortic atherosclerosis. These results were called by telephone at the time of interpretation on 10/24/2016 at 12:06 pm to Dr. [name] , who verbally acknowledged these results. Electronically Signed   By: Ilona Sorrel M.D.   On: 10/24/2016 12:07   US Abdomen Complete  Result Date: 11/20/2016 CLINICAL DATA:  76 year old female with multifocal hepatocellular carcinoma. Prior thermal ablation. Portal vein tumor thrombus. Cirrhosis. Ascites. EXAM: ABDOMEN ULTRASOUND COMPLETE COMPARISON:  Liver MRI 10/24/2016 and earlier. FINDINGS: Gallbladder: Gallbladder wall thickening appears stable from the recent MRI. No cholelithiasis is evident. No sonographic Murphy sign. Common bile duct: Diameter: 4 mm , normal Liver: Nodular and cirrhotic with moderate to severe parenchymal heterogeneity. Hypoechoic right lobe lesion which seems to correspond to the thermal ablation site. No intrahepatic biliary ductal dilatation. Abnormal main portal vein (image 52) again noted. IVC: Diminutive, stable from the recent MRI. Pancreas: Stable from the recent MRI, including cystic pancreatic neck lesion (image 57) and pancreatic ductal dilatation (image 61).  Spleen: Mild  splenomegaly appears stable. Right Kidney: Length: 11.5 cm. Echogenicity within normal limits. No mass or hydronephrosis visualized. Left Kidney: Length: 10.9 cm. Echogenicity within normal limits. No mass or hydronephrosis visualized. Abdominal aorta: Incompletely visualized due to overlying bowel gas, visualized portions within normal limits. Other findings: A small volume of ascites, primarily around the liver but also about the spleen appears stable since the prior MRI. IMPRESSION: 1. Overall stable findings from the liver MRI 10/24/2016 - please also see that report. 2. Multifocal hepatocellular carcinoma.  Portal vein tumor thrombus. 3. Stable small volume ascites, mostly around a liver. Stable gallbladder wall thickening, likely reactive. Mild splenomegaly. Electronically Signed   By: Genevie Ann M.D.   On: 11/20/2016 15:59    Microbiology: No results found for this or any previous visit (from the past 240 hour(s)).   Labs: Basic Metabolic Panel:  Recent Labs Lab 11/19/16 1940 11/20/16 0504 11/21/16 0755  NA 133* 136 133*  K 3.5 3.6 3.9  CL 99* 104 99*  CO2 24 24 25   GLUCOSE 386* 259* 230*  BUN 18 17 18   CREATININE 1.32* 1.12* 1.19*  CALCIUM 9.1 9.2 10.0   Liver Function Tests:  Recent Labs Lab 11/19/16 1940 11/20/16 0504 11/21/16 0755  AST 93* 81* 104*  ALT 33 29 38  ALKPHOS 247* 215* 256*  BILITOT 2.1* 1.7* 1.7*  PROT 7.8 6.7 7.6  ALBUMIN 2.9* 2.5* 2.7*   No results for input(s): LIPASE, AMYLASE in the last 168 hours.  Recent Labs Lab 11/19/16 1940 11/21/16 0755  AMMONIA 41* 39*   CBC:  Recent Labs Lab 11/19/16 1940 11/20/16 0504 11/21/16 0755  WBC 6.5 5.5 6.4  NEUTROABS  --  3.5 4.3  HGB 10.8* 10.2* 11.2*  HCT 32.4* 30.9* 34.6*  MCV 91.3 90.6 92.5  PLT 256 200 222   Cardiac Enzymes: No results for input(s): CKTOTAL, CKMB, CKMBINDEX, TROPONINI in the last 168 hours. BNP: BNP (last 3 results)  Recent Labs  11/19/16 2024  BNP 232.1*    ProBNP  (last 3 results) No results for input(s): PROBNP in the last 8760 hours.  CBG:  Recent Labs Lab 11/20/16 0746 11/20/16 1148 11/20/16 1721 11/20/16 2111 11/21/16 0748  GLUCAP 215* 306* 275* 282* 210*       Signed:  Denyce Harr MD, PhD  [facility] 11/21/2016, 12:36 PM

## 2016-11-21 NOTE — Evaluation (Signed)
Physical Therapy Evaluation Patient Details Name: [name] [name] MRN: [id number] DOB: Feb 27, 1941 Today's Date: 11/21/2016   History of Present Illness  Pt is a 76 y.o. female with a past medical history significant for NASH cirrhosis, DM, HTN, mild dementia, and hepatocellular carcinoma  recent chemoembolization who presents with acute confusion.  Clinical Impression  Pt admitted as above and presenting with functional mobility limitations 2* generalized weakness and mild ambulatory balance deficits.  Pt and dtr plan dc home with dtr stating that pt is currently close to baseline for mobility.    Follow Up Recommendations No PT follow up    Equipment Recommendations  None recommended by PT    Recommendations for Other Services       Precautions / Restrictions Precautions Precautions: Fall Restrictions Weight Bearing Restrictions: No      Mobility  Bed Mobility Overal bed mobility: Modified Independent             General bed mobility comments: pt unassisted to EOB sitting  Transfers Overall transfer level: Modified independent Equipment used: None Transfers: Sit to/from Stand Sit to Stand: Modified independent (Device/Increase time)         General transfer comment: pt unassisted to stand from EOB and to sit in recliner  Ambulation/Gait Ambulation/Gait assistance: Min guard;Supervision Ambulation Distance (Feet): 200 Feet Assistive device: None (attempted RW - pt carried it) Gait Pattern/deviations: Step-through pattern;Decreased step length - right;Decreased step length - left;Shuffle;Trunk flexed Gait velocity: dec Gait velocity interpretation: Below normal speed for age/gender General Gait Details: increased time, increased BOS, shuffling gait.  Pt able to step bkwd, sideways and turn 360 degrees without instability or LOB  Stairs            Wheelchair Mobility    Modified Rankin (Stroke Patients Only)       Balance Overall balance  assessment: Needs assistance Sitting-balance support: No upper extremity supported;Feet supported Sitting balance-Leahy Scale: Good     Standing balance support: No upper extremity supported Standing balance-Leahy Scale: Good                               Pertinent Vitals/Pain Pain Assessment: No/denies pain    Home Living Family/patient expects to be discharged to:: Private residence Living Arrangements: Spouse/significant other;Children Available Help at Discharge: Family Type of Home: House Home Access: Stairs to enter   Technical brewer of Steps: 1 Home Layout: Two level;Bed/bath upstairs (Has stair lift recently installed) Home Equipment: Environmental consultant - 4 wheels;Shower seat Additional Comments: Dtr lives with parents    Prior Function Level of Independence: Independent;Independent with assistive device(s)         Comments: uses rollator in the community. walks without a device inside home     Hand Dominance        Extremity/Trunk Assessment   Upper Extremity Assessment Upper Extremity Assessment: Generalized weakness    Lower Extremity Assessment Lower Extremity Assessment: Generalized weakness    Cervical / Trunk Assessment Cervical / Trunk Assessment: Kyphotic  Communication   Communication: HOH  Cognition Arousal/Alertness: Awake/alert Behavior During Therapy: WFL for tasks assessed/performed;Flat affect Overall Cognitive Status: History of cognitive impairments - at baseline                                        General Comments      Exercises  Assessment/Plan    PT Assessment Patient needs continued PT services  PT Problem List Decreased strength;Decreased activity tolerance;Decreased balance;Decreased mobility       PT Treatment Interventions DME instruction;Gait training;Stair training;Functional mobility training;Therapeutic activities;Therapeutic exercise;Patient/family education    PT Goals  (Current goals can be found in the Care Plan section)  Acute Rehab PT Goals Patient Stated Goal: HOme PT Goal Formulation: With patient Time For Goal Achievement: 12/02/16 Potential to Achieve Goals: Good    Frequency Min 3X/week   Barriers to discharge        Co-evaluation               AM-PAC PT "6 Clicks" Daily Activity  Outcome Measure Difficulty turning over in bed (including adjusting bedclothes, sheets and blankets)?: None Difficulty moving from lying on back to sitting on the side of the bed? : A Little Difficulty sitting down on and standing up from a chair with arms (e.g., wheelchair, bedside commode, etc,.)?: A Little Help needed moving to and from a bed to chair (including a wheelchair)?: None Help needed walking in hospital room?: None Help needed climbing 3-5 steps with a railing? : A Little 6 Click Score: 21    End of Session Equipment Utilized During Treatment: Gait belt Activity Tolerance: Patient tolerated treatment well Patient left: in chair;with call bell/phone within reach;with family/visitor present;with chair alarm set Nurse Communication: Mobility status PT Visit Diagnosis: Unsteadiness on feet (R26.81)    Time: 4665-9935 PT Time Calculation (min) (ACUTE ONLY): 20 min   Charges:   PT Evaluation $PT Eval Low Complexity: 1 Procedure     PT G Codes:        Pg [phone]   Retina Bernardy 11/21/2016, 10:20 AM

## 2016-11-21 NOTE — Progress Notes (Signed)
Date: November 21, 2016 Chart reviewed for discharge orders: None found for case management. Vernia Buff, [phone]

## 2016-11-21 NOTE — Progress Notes (Signed)
Blood glucose not taken for lunch time she is refused.

## 2016-11-22 ENCOUNTER — Telehealth: Payer: Self-pay | Admitting: Internal Medicine

## 2016-11-22 NOTE — Telephone Encounter (Signed)
Pt released from hospital yesterday and needs follow-up in 2 week. States was admitted for elevated ammonia. Pt scheduled to see Alonza Bogus PA 12/05/16@1 :30pm. Daughter aware of appt.

## 2016-11-23 ENCOUNTER — Telehealth: Payer: Self-pay | Admitting: *Deleted

## 2016-11-23 NOTE — Telephone Encounter (Signed)
D/C 11/21/16  DX: encephalopathy  Transition Care Management Follow-up Telephone Call  How have you been since you were released from the hospital? Per spouse, Richard (on DPR), patient has done well since d/c from hospital; no concerns or complaints, patient has returned to baseline   Do you understand why you were in the hospital? Yes, spouse reports that patient has cirrhosis of the liver and liver cancer, which results in complications   Do you understand the discharge instrcutions? Yes, spouse reports that the hospital staff reviewed all d/c instructions thoroughly, patient is increasing activity slowly, taking medications as ordered, and following dietary restrictions.   Items Reviewed:  Medications reviewed: yes, reviewed medication changes per hospital discharge, discussed change in lasix dose, reviewed medication schedule, including purpose, actions, and side effects. Understanding voiced. Patient's spouse and daughter help patient to manage medications  Allergies reviewed: yes  Dietary changes reviewed: yes  Referrals reviewed: yes   Functional Questionnaire:   Activities of Daily Living (ADLs):   She states they are independent in the following: ambulation, feeding, continence, grooming, toileting and dressing States they require assistance with the following: bathing and hygiene   Any transportation issues/concerns?: no, spouse is able to transport patient    Any patient concerns? no   Confirmed importance and date/time of follow-up visits scheduled: yes, reviewed importance of follow up visits to prevent rehospitalization; patient to see PCP on 11/28/16 at 230 and has follow up with GI and [facility] next week; spouse voiced understanding of all follow up appts  Confirmed with patient if condition begins to worsen call PCP or go to the ER.  Patient was given the Call-a-Nurse line [phone]: yes, reviewed s/s to seek urgent medical attention. Spouse voiced  understanding.

## 2016-11-24 ENCOUNTER — Encounter: Payer: Self-pay | Admitting: Family Medicine

## 2016-11-24 ENCOUNTER — Ambulatory Visit (INDEPENDENT_AMBULATORY_CARE_PROVIDER_SITE_OTHER): Payer: Medicare Other | Admitting: Family Medicine

## 2016-11-24 VITALS — BP 99/72 | HR 75 | Temp 98.9°F | Ht 65.0 in | Wt 156.0 lb

## 2016-11-24 DIAGNOSIS — E669 Obesity, unspecified: Secondary | ICD-10-CM | POA: Diagnosis not present

## 2016-11-24 DIAGNOSIS — E1169 Type 2 diabetes mellitus with other specified complication: Secondary | ICD-10-CM

## 2016-11-24 DIAGNOSIS — G9340 Encephalopathy, unspecified: Secondary | ICD-10-CM | POA: Diagnosis not present

## 2016-11-24 DIAGNOSIS — F03A0 Unspecified dementia, mild, without behavioral disturbance, psychotic disturbance, mood disturbance, and anxiety: Secondary | ICD-10-CM

## 2016-11-24 DIAGNOSIS — F0390 Unspecified dementia without behavioral disturbance: Secondary | ICD-10-CM

## 2016-11-24 MED ORDER — NADOLOL 20 MG PO TABS: 20.0000 mg | ORAL_TABLET | Freq: Every day | ORAL | 3 refills | Status: AC

## 2016-11-24 MED ORDER — SPIRONOLACTONE 50 MG PO TABS: 50.0000 mg | ORAL_TABLET | Freq: Two times a day (BID) | ORAL | 3 refills | Status: AC

## 2016-11-24 MED ORDER — FERROUS SULFATE 325 (65 FE) MG PO TABS: 325.0000 mg | ORAL_TABLET | Freq: Three times a day (TID) | ORAL | 3 refills | Status: AC

## 2016-11-24 NOTE — Progress Notes (Signed)
   Subjective:    Patient ID: [name] [name], female    DOB: August 14, 1940, 76 y.o.   MRN: [id number]  HPI Here for advice about her diabetes. She was in the hospital from 11-19-16 to 11-21-16 for hepatic encephalopathy, and she was told to start back on Lactulose in addition to Rifaximin. Also her glucoses have been very elevated for several months. She has am fasting readings of 150 to180 but then has many readings during the day of high 200s and low 300s. She currently taking Humulin NPH at 10 units in the am and 8 in the pm, and this is supplemented by a sliding scale of Novolog. In addition the family would like to switch her to a new Endocrinologist. She cuurently sees Dr. Chalmers Cater. She feels better and her thinking is more clear, though she sleeps a lot.   Review of Systems  Constitutional: Negative.   Respiratory: Negative.   Cardiovascular: Negative.   Gastrointestinal: Negative.   Neurological: Negative.        Objective:   Physical Exam  Constitutional: She appears well-developed and well-nourished.  Cardiovascular: Normal rate, regular rhythm, normal heart sounds and intact distal pulses.   Pulmonary/Chest: Effort normal and breath sounds normal.  Neurological: She is alert.  Oriented to name and place, not date           Assessment & Plan:  She is recovering from a bout of hepatic encephalopathy. She will see [place] again on 11-29-15 for a full transitional care management exam. We will refer her to see [facility] soon. For the time being we will increase the NPH insulin to 20 units in the am and 12 in the pm.  Alysia Penna, MD

## 2016-11-24 NOTE — Patient Instructions (Signed)
WE NOW OFFER   [facility] FAST TRACK!!!  SAME DAY Appointments for ACUTE CARE  Such as: Sprains, Injuries, cuts, abrasions, rashes, muscle pain, joint pain, back pain Colds, flu, sore throats, headache, allergies, cough, fever  Ear pain, sinus and eye infections Abdominal pain, nausea, vomiting, diarrhea, upset stomach Animal/insect bites  3 Easy Ways to Schedule: Walk-In Scheduling Call in scheduling Mychart Sign-up: [email or web address]         

## 2016-11-28 ENCOUNTER — Ambulatory Visit (INDEPENDENT_AMBULATORY_CARE_PROVIDER_SITE_OTHER): Payer: Medicare Other | Admitting: Family Medicine

## 2016-11-28 ENCOUNTER — Encounter: Payer: Self-pay | Admitting: Family Medicine

## 2016-11-28 VITALS — BP 112/78 | HR 75 | Temp 99.2°F | Ht 65.0 in | Wt 153.0 lb

## 2016-11-28 DIAGNOSIS — C220 Liver cell carcinoma: Secondary | ICD-10-CM | POA: Diagnosis not present

## 2016-11-28 DIAGNOSIS — K7469 Other cirrhosis of liver: Secondary | ICD-10-CM | POA: Diagnosis not present

## 2016-11-28 DIAGNOSIS — G9340 Encephalopathy, unspecified: Secondary | ICD-10-CM | POA: Diagnosis not present

## 2016-11-28 DIAGNOSIS — E1169 Type 2 diabetes mellitus with other specified complication: Secondary | ICD-10-CM | POA: Diagnosis not present

## 2016-11-28 DIAGNOSIS — E669 Obesity, unspecified: Secondary | ICD-10-CM

## 2016-11-28 DIAGNOSIS — I10 Essential (primary) hypertension: Secondary | ICD-10-CM

## 2016-11-28 DIAGNOSIS — F0390 Unspecified dementia without behavioral disturbance: Secondary | ICD-10-CM | POA: Diagnosis not present

## 2016-11-28 DIAGNOSIS — F03A0 Unspecified dementia, mild, without behavioral disturbance, psychotic disturbance, mood disturbance, and anxiety: Secondary | ICD-10-CM

## 2016-11-28 NOTE — Patient Instructions (Signed)
WE NOW OFFER   [facility] FAST TRACK!!!  SAME DAY Appointments for ACUTE CARE  Such as: Sprains, Injuries, cuts, abrasions, rashes, muscle pain, joint pain, back pain Colds, flu, sore throats, headache, allergies, cough, fever  Ear pain, sinus and eye infections Abdominal pain, nausea, vomiting, diarrhea, upset stomach Animal/insect bites  3 Easy Ways to Schedule: Walk-In Scheduling Call in scheduling Mychart Sign-up: [email or web address]         

## 2016-11-28 NOTE — Progress Notes (Signed)
   Subjective:    Patient ID: [name] [name], female    DOB: 04/07/1941, 76 y.o.   MRN: [id number]  HPI Here for a transitional care visit to follow up a hospital stay from 11-20-15 to 11-21-16 for hepatic encephalopathy. Her ammonia level had jumped up to 41 and was 39 by the time of DC. She had been taking Rifaximin but had stopped taking Lactulose. Since going home she has been taking both as directed and she has been doing well. Her husband says she has not been confused like before although she stays sleepy most of the time. Her BP has been stable. At her last visit we increased her NPH insulin to 20 units in the am and 12 in the pm. Her glucoses have still been running high in the 235 to 320 range.    Review of Systems  Constitutional: Negative.   Respiratory: Negative.   Cardiovascular: Negative.   Gastrointestinal: Negative.   Genitourinary: Negative.   Neurological: Negative.   Psychiatric/Behavioral: Positive for confusion. Negative for agitation, behavioral problems, dysphoric mood and hallucinations. The patient is not nervous/anxious.        Objective:   Physical Exam  Constitutional: She appears well-developed and well-nourished.  Cardiovascular: Normal rate, regular rhythm and intact distal pulses.   Soft 2/6 SM   Pulmonary/Chest: Effort normal and breath sounds normal. No respiratory distress. She has no wheezes. She has no rales.  Abdominal: Soft. Bowel sounds are normal. She exhibits distension. She exhibits no mass. There is no tenderness. There is no rebound and no guarding.  Neurological: She is alert.          Assessment & Plan:  Transitional care visit. Her encephalopathy from the hepatocellular carcinoma has improved, so she will stay on Rifaximin and Lactulose. She is set to see GI on 12-05-16. Her diabetes is not well controled so we will increase the NPH to 30 units in the am and 20 in the pm. They are waiting for a call back from the [place] office  to set up an appt soon to address this. Her HTN is stable. Her dementia is stable. Her CHF is stable.  Alysia Penna, MD

## 2016-11-29 ENCOUNTER — Ambulatory Visit (HOSPITAL_BASED_OUTPATIENT_CLINIC_OR_DEPARTMENT_OTHER): Payer: Medicare Other | Admitting: Nurse Practitioner

## 2016-11-29 ENCOUNTER — Telehealth: Payer: Self-pay | Admitting: Oncology

## 2016-11-29 VITALS — BP 115/61 | HR 76 | Temp 98.5°F | Resp 18 | Ht 65.0 in | Wt 154.6 lb

## 2016-11-29 DIAGNOSIS — E119 Type 2 diabetes mellitus without complications: Secondary | ICD-10-CM

## 2016-11-29 DIAGNOSIS — Z853 Personal history of malignant neoplasm of breast: Secondary | ICD-10-CM | POA: Diagnosis not present

## 2016-11-29 DIAGNOSIS — C220 Liver cell carcinoma: Secondary | ICD-10-CM

## 2016-11-29 DIAGNOSIS — C50111 Malignant neoplasm of central portion of right female breast: Secondary | ICD-10-CM

## 2016-11-29 NOTE — Telephone Encounter (Signed)
Gave patient avs report and appointments for September.  °

## 2016-11-29 NOTE — Progress Notes (Addendum)
  [name] [name] OFFICE PROGRESS NOTE   Diagnosis:  Hepatocellular carcinoma  INTERVAL HISTORY:   [name] [name] returns for follow-up. She was hospitalized 11/19/2016 through 11/21/2016 with altered mental status. This was felt to be secondary to acute encephalopathy. Ammonia was minimally elevated and she was taking lactulose at the time of admission. She improved during the hospitalization with mental status returning to baseline.  Family reports metal status continues to be at baseline. She is now taking lactulose twice a day. She continues Rifaximin. Appetite is unchanged. She occasionally notes mild pain at the right abdomen. This has not required pain medication. No fever.  Objective:  Vital signs in last 24 hours:  Blood pressure 115/61, pulse 76, temperature 98.5 F (36.9 C), temperature source Oral, resp. rate 18, height 5\' 5"  (1.651 m), weight 154 lb 9.6 oz (70.1 kg), SpO2 100 %.    HEENT: Neck without mass. Resp: Lungs clear bilaterally. Cardio: Regular rate and rhythm. GI: Liver palpable right medial abdomen. Mildly distended. Nontender. Vascular: No leg edema. Neuro: Alert. Follows commands.    Lab Results:  Lab Results  Component Value Date   WBC 6.4 11/21/2016   HGB 11.2 (L) 11/21/2016   HCT 34.6 (L) 11/21/2016   MCV 92.5 11/21/2016   PLT 222 11/21/2016   NEUTROABS 4.3 11/21/2016    Imaging:  No results found.  Medications: I have reviewed the patient's current medications.  Assessment/Plan: 1. NASH related cirrhosis  2. MRI liver 07/05/2015 with 3 hypervascular lesions concerning for multifocal hepatocellular carcinoma  Ultrasound-guided biopsy of a right liver lesion on 07/19/2015-No [facility] or metastatic carcinoma  MRI liver 04/10/2016-enlargement of right hepatic lobe lesion with characteristics of HCC, 1.3 cm segment 6 lesion, resolution of a third lesion near the gallbladder fossa  Chemotherapy embolization of segment 6 [facility]  08/01/2016  Ablation of segment 6 [facility] 09/01/2016  MRI 10/24/2016-progression of infiltrative tumor in the right liver, enlarging left liver lesion, new tumor thrombus in the right and left portal veins, progressive ascites  3. Memory loss secondary to dementia versus hepatic encephalopathy  4. History of stage III right-sided breast cancer diagnosed in 2001  5. 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 Diabetes  7. Stage II (T3 N0), right colon, 2005  8. Removal of a benign greater curvature polyp 07/15/2015 9. Anemia -iron deficiency versus anemia of chronic disease versus myelodysplasia, improved  Upper endoscopy 10/11/2016-portal hypertensive gastropathy, mild nonbleeding gastritis   Disposition: [name] [name] appears unchanged. She does not seem to be symptomatic from the cancer. Dr. Benay Spice recommends continued observation. She will return for a follow-up visit in 6 weeks.  Patient seen with Dr. Benay Spice.    Ned Card ANP/GNP-BC   11/29/2016  2:00 PM  This was a shared visit with Ned Card. [name] [name] was interviewed and examined. Her symptoms appear mostly related to hepatic encephalopathy from cirrhosis. She does not appear to have significant symptoms related to the hepatocellular carcinoma. She is a poor candidate for systemic therapy. The plan is to continue observation.  [name] [name], M.D.

## 2016-12-01 ENCOUNTER — Ambulatory Visit (INDEPENDENT_AMBULATORY_CARE_PROVIDER_SITE_OTHER): Payer: Medicare Other | Admitting: Endocrinology

## 2016-12-01 ENCOUNTER — Encounter: Payer: Self-pay | Admitting: Endocrinology

## 2016-12-01 VITALS — BP 112/66 | HR 73 | Ht 65.0 in | Wt 151.0 lb

## 2016-12-01 DIAGNOSIS — E1169 Type 2 diabetes mellitus with other specified complication: Secondary | ICD-10-CM | POA: Diagnosis not present

## 2016-12-01 DIAGNOSIS — E669 Obesity, unspecified: Secondary | ICD-10-CM

## 2016-12-01 LAB — POCT GLYCOSYLATED HEMOGLOBIN (HGB A1C): HEMOGLOBIN A1C: 8

## 2016-12-01 MED ORDER — INSULIN NPH (HUMAN) (ISOPHANE) 100 UNIT/ML ~~LOC~~ SUSP: SUBCUTANEOUS | 11 refills | Status: AC

## 2016-12-01 MED ORDER — INSULIN ASPART 100 UNIT/ML FLEXPEN: PEN_INJECTOR | SUBCUTANEOUS | 0 refills | Status: AC

## 2016-12-01 NOTE — Patient Instructions (Addendum)
good diet and exercise significantly improve the control of your diabetes.  please let me know if you wish to be referred to a dietician.  you should see an eye doctor and dentist every year.  It is very important to get all recommended vaccinations.   Controlling your blood pressure and cholesterol drastically reduces the damage diabetes does to your body.  Those who smoke should quit.  Please discuss these with your doctor.   check your blood sugar 3 times a day.  vary the time of day when you check, between before the 3 meals, and at bedtime.  also check if you have symptoms of your blood sugar being too high or too low.  please keep a record of the readings and bring it to your next appointment here (or you can bring the meter itself).  You can write it on any piece of paper.  please call us sooner if your blood sugar goes below 70, or if you have a lot of readings over 200.   Please increase the NPH to 40 units each morning, and 20 units each evening, and:  Take novolog, just 3 units for any blood sugar over 300.

## 2016-12-01 NOTE — Progress Notes (Signed)
Subjective:    Patient ID: [name] [name], female    DOB: 04-02-1941, 76 y.o.   MRN: [id number]  HPI pt is referred by Dr Sarajane Jews, for diabetes.  Pt states DM was dx'ed in 1990; she has mild if any neuropathy of the lower extremities; she has associated renal insuff; she has been on insulin since 2000; pt says her diet is good, but exercise is poor; she has never had GDM, pancreatitis, pancreatic surgery, severe hypoglycemia or DKA.  She is not receiving any cancer rx now.  Pt takes NPH insulin 30 units qam and 20 units QPM.  She also takes PRN novolog, for a total of approx 20 units per day.  Husband says cbg's vary from 150-400.  It is in general higher as the day goes on.  husb gives insulin to pt, but pt checks her own cbg.   Past Medical History:  Diagnosis Date  . Adenocarcinoma, breast [facility])    sees Dr. Marcy Panning   . Allergy    environmental  . Arthritis    sees Dr. Hurley Cisco   . Barrett esophagus 12/20/11  . Bladder cancer ([place])   . Breast cancer ([place])   . Cancer, hepatocellular ([place])   . CHF (congestive heart failure) ([place])   . Dementia    mild  . Diabetes mellitus    sees Dr. Chalmers Cater   . Diverticulosis of colon (without mention of hemorrhage) 09/26/2004,09/10/2006  . Gastritis   . GERD (gastroesophageal reflux disease)   . Hemorrhoids   . Hepatic cirrhosis ([place])   . Hepatic steatosis   . Hiatal hernia   . Hyperglycemia   . Hyperlipidemia   . Hypertension   . IBS (irritable bowel syndrome)   . Inflammatory polyps of colon ([place])   . Iron deficiency anemia   . Malignant neoplasm of colon, unspecified site 07/08/2003  . Personal history of chemotherapy   . Personal history of radiation therapy   . Sleep apnea    sees Dr. Gwenette Greet   . Transaminase or LDH elevation    sees Dr. Zenovia Jarred     Past Surgical History:  Procedure Laterality Date  . bladder cancer 2001    . BREAST BIOPSY    . BREAST SURGERY  2001   lymph nodes removed and rt mastectomy  . CARPAL  TUNNEL RELEASE    . [place]   for diverticulitis  . COLONOSCOPY    . ESOPHAGEAL DILATION    . incisonal hernia    . IR GENERIC HISTORICAL  04/25/2016   IR RADIOLOGIST EVAL & MGMT 04/25/2016 [name], MD [facility] INTERV RAD  . IR GENERIC HISTORICAL  08/01/2016   IR 3D INDEPENDENT [facility]ST 08/01/2016 [facility]  . IR GENERIC HISTORICAL  08/01/2016   IR ANGIOGRAM VISCERAL SELECTIVE 08/01/2016 [facility]  . IR GENERIC HISTORICAL  08/01/2016   IR EMBO TUMOR ORGAN ISCHEMIA INFARCT INC GUIDE ROADMAPPING 08/01/2016 [facility]  . IR GENERIC HISTORICAL  08/01/2016   IR US GUIDE VASC ACCESS RIGHT 08/01/2016 [facility]  . IR GENERIC HISTORICAL  08/01/2016   IR ANGIOGRAM SELECTIVE EACH ADDITIONAL VESSEL 08/01/2016 [facility]  . IR GENERIC HISTORICAL  08/01/2016   IR ANGIOGRAM SELECTIVE EACH ADDITIONAL VESSEL 08/01/2016 [facility]  . IR RADIOLOGIST EVAL & MGMT  08/17/2016  . IR RADIOLOGIST EVAL & MGMT  07/19/2016  . MASTECTOMY    . mastectomy and biopsy    . RADIOFREQUENCY ABLATION N/A 09/01/2016   Procedure:  MICROWAVE THERMAL ABLATION;  Surgeon: [name], MD;  Location: [facility];  Service: Anesthesiology;  Laterality: N/A;  . sigmoid colectomy 2005     for cancer  . squamous cell CA  R  calf  2008      Social History   Social History  . Marital status: Married    Spouse name: Carmelia Roller  . Number of children: 2  . Years of education: N/A   Occupational History  . 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 Smoking status: Former Smoker    Packs/day: 2.00    Years: 20.00    Types: Cigarettes    Quit date: 05/09/1975  . Smokeless tobacco: Never Used     Comment: smoked 1958-1977, up to 2 ppd  . Alcohol use No     Comment: rare  . Drug use: No  . Sexual activity: Not Currently   Other Topics Concern  . 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 aspirin EC 81 MG tablet Take 81 mg by mouth daily.     . Calcium Carbonate-Vitamin D (CALCIUM 600+D) 600-400 MG-UNIT tablet Take 1 tablet by mouth daily.    . diphenhydrAMINE (BENADRYL) 25 MG tablet Take 25 mg by mouth at bedtime.    . docusate sodium (COLACE) 100 MG capsule Take 100 mg by mouth daily as needed for mild constipation.    . ferrous sulfate 325 (65 FE) MG tablet Take 1 tablet (325 mg total) by mouth 3 (three) times daily with meals. 270 tablet 3  . furosemide (LASIX) 40 MG tablet Take 20 mg by mouth 3 (three) times daily.    Marland Kitchen lactose free nutrition (BOOST PLUS) LIQD Take 237 mLs by mouth 3 (three) times daily with meals. 30 Can 0  . lactulose (CHRONULAC) 10 GM/15ML solution Take 30 mLs (20 g total) by mouth 2 (two) times daily. 5400 mL 3  . loratadine (CLARITIN) 10 MG tablet Take 10 mg by mouth daily.    . meclizine (ANTIVERT) 25 MG tablet Take 1 tablet (25 mg total) by mouth every 4 (four) hours as needed for dizziness. 60 tablet 5  . nadolol (CORGARD) 20 MG tablet Take 1 tablet (20 mg total) by mouth daily. 90 tablet 3  . omega-3 acid ethyl esters (LOVAZA) 1 g capsule Take 1 g by mouth at bedtime.    Marland Kitchen omeprazole (PRILOSEC) 20 MG capsule TAKE 1 CAPSULE TWICE DAILY BEFORE MEALS 180 capsule 1  . ondansetron (ZOFRAN-ODT) 4 MG disintegrating tablet Take 1 tablet (4 mg total) by mouth 3 (three) times daily as needed for nausea or vomiting. 270 tablet 0  . promethazine (PHENERGAN) 12.5 MG tablet Take 1 tablet (12.5 mg total) by mouth every 6 (six) hours as needed for nausea or vomiting. 30 tablet 1  . rifaximin (XIFAXAN) 550 MG TABS tablet Take 1 tablet (550 mg total) by mouth 2 (two) times daily. 180 tablet 3  . spironolactone (ALDACTONE) 50 MG tablet Take 1 tablet (50 mg total) by mouth 2 (two) times daily. 180 tablet 3  . traMADol (ULTRAM) 50 MG tablet TAKE ONE TABLET BY MOUTH  EVERY 4 HOURS AS NEEDED (Patient taking differently: Take 1 tablet every four hours as  needed for pain) 120 tablet 5   No current facility-administered medications on file prior to visit.     No Known Allergies  Family History  Problem Relation Age of Onset  . Diabetes Mother   . Skin cancer Mother   . Lung cancer Father   . Lymphoma Sister   . Hypertension Sister   . Breast cancer Sister   . Diabetes Sister   . Hypertension Sister   . Breast cancer Sister   . Diabetes Sister   . Hypertension Sister   . Inflammatory bowel disease Daughter   . Esophageal cancer Son   . Stroke Neg Hx   . Colon cancer Neg Hx     BP 112/66   Pulse 73   Ht 5\' 5"  (1.651 m)   Wt 151 lb (68.5 kg)   SpO2 97%   BMI 25.13 kg/m    Review of Systems denies blurry vision, headache, chest pain, sob, urinary frequency, muscle cramps, dry skin, depression, and easy bruising.  She is losing weight. She has chronic nausea, cold intolerance, memory loss, and rhinorrhea.     Objective:   Physical Exam VS: see vs page GEN: no distress HEAD: head: no deformity eyes: no periorbital swelling, no proptosis external nose and ears are normal mouth: no lesion seen NECK: supple, thyroid is not enlarged CHEST WALL: no deformity LUNGS: clear to auscultation CV: reg rate and rhythm, no murmur ABD: abdomen is soft, nontender.  no hepatosplenomegaly.  Slightly distended with ascites.  no hernia MUSCULOSKELETAL: muscle bulk and strength are grossly normal.  no obvious joint swelling.  gait is slow but steady, with a walker.  EXTEMITIES: no deformity.  no ulcer on the feet.  feet are of normal color and temp.  no edema.  There is bilateral onychomycosis of the toenails.  PULSES: dorsalis pedis intact bilat.  no carotid bruit NEURO:  cn 2-12 grossly intact.   readily moves all 4's.  sensation is intact to touch on the feet.  SKIN:  Normal texture and temperature.  No rash or suspicious lesion is visible.   NODES:   None palpable at the neck PSYCH: alert, oriented to self, place, and 2018.  Does not appear anxious nor depressed.   I personally reviewed electrocardiogram tracing (11/19/16): Indication: AMS Impression: NSR with long QT.  No MI.  No hypertrophy. Compared to 09/14/14: long QT is new  Lab Results  Component Value Date   CREATININE 1.19 (H) 11/21/2016   BUN 18 11/21/2016   NA 133 (L) 11/21/2016   K 3.9 11/21/2016   CL 99 (L) 11/21/2016   CO2 25 11/21/2016    A1c=8.0%  I have reviewed outside records, and summarized: Pt was noted to have elevated a1c, and referred here.  She has been followed at the cancer center, where cbg's have been high, despite multiple insulin adjustments.      Assessment & Plan:  Insulin-requiring type 2 DM, with renal insuff: severe exac Hepatic cancer, new to me: in view of grim long-term prognosis, she is not a candidate for aggressive glycemic control.  Anemia: with high RBC turnover.  This is prob falsely suppressing a1c.    Patient Instructions  good diet and exercise significantly improve the control of your diabetes.  please let me know if you wish to be referred to a dietician.  you should see an eye doctor and dentist every year.  It is very important to get all recommended vaccinations.   Controlling your blood pressure  and cholesterol drastically reduces the damage diabetes does to your body.  Those who smoke should quit.  Please discuss these with your doctor.   check your blood sugar 3 times a day.  vary the time of day when you check, between before the 3 meals, and at bedtime.  also check if you have symptoms of your blood sugar being too high or too low.  please keep a record of the readings and bring it to your next appointment here (or you can bring the meter itself).  You can write it on any piece of paper.  please call us sooner if your blood sugar goes below 70, or if you have a lot of readings over 200.   Please increase the NPH to 40 units  each morning, and 20 units each evening, and:  Take novolog, just 3 units for any blood sugar over 300.

## 2016-12-05 ENCOUNTER — Encounter: Payer: Self-pay | Admitting: Gastroenterology

## 2016-12-05 ENCOUNTER — Other Ambulatory Visit: Payer: Self-pay

## 2016-12-05 ENCOUNTER — Ambulatory Visit (INDEPENDENT_AMBULATORY_CARE_PROVIDER_SITE_OTHER): Payer: Medicare Other | Admitting: Gastroenterology

## 2016-12-05 VITALS — BP 110/70 | HR 77 | Ht 65.0 in | Wt 149.9 lb

## 2016-12-05 DIAGNOSIS — K7290 Hepatic failure, unspecified without coma: Secondary | ICD-10-CM | POA: Diagnosis not present

## 2016-12-05 DIAGNOSIS — K7581 Nonalcoholic steatohepatitis (NASH): Secondary | ICD-10-CM

## 2016-12-05 DIAGNOSIS — K7460 Unspecified cirrhosis of liver: Secondary | ICD-10-CM | POA: Diagnosis not present

## 2016-12-05 DIAGNOSIS — K7682 Hepatic encephalopathy: Secondary | ICD-10-CM

## 2016-12-05 DIAGNOSIS — C220 Liver cell carcinoma: Secondary | ICD-10-CM

## 2016-12-05 MED ORDER — GLUCOSE BLOOD VI STRP: ORAL_STRIP | 2 refills | Status: AC

## 2016-12-05 NOTE — Progress Notes (Addendum)
12/05/2016 [name] [name] [id number] August 24, 1940   HISTORY OF PRESENT ILLNESS:  This is a 76 year old female who is known to Dr. Hilarie Fredrickson.  Patient has numerous other medical issues including history of colon cancer diagnosed 2005 for which she underwent partial colectomy, now with [name] cirrhosis and hepatocellular CA. She underwent chemoembolization in March 2018 and microwave ablation in April 2018.  Recent imaging shows significant progression of her cancer. Has hepatic encephalopathy and is currently on lactulose 30 mL twice a day along with Xifaxan 550 mg twice a day. Was recently hospitalized a couple weeks ago for confusion/altered mental status. She had been placed him on Xifaxan and discontinued the lactulose, which precipitated this. Now she is back on the combination of medication and has been doing better in regards to her mental status since the hospital discharge. She is only having about 2 soft stools daily, but family reports she is at about her baseline. Patient refuses to take any increased doses because she feels she'll be having too many bowel movements. Family expresses her concern about her sleeping about 22 or 23 hours of the day and only waking up to use the bathroom and eat.    Past Medical History:  Diagnosis Date  . Adenocarcinoma, breast [facility])    sees Dr. Marcy Panning   . Allergy    environmental  . Arthritis    sees Dr. Hurley Cisco   . Barrett esophagus 12/20/11  . Bladder cancer ([place])   . Breast cancer ([place])   . Cancer, hepatocellular ([place])   . CHF (congestive heart failure) ([place])   . Dementia    mild  . Diabetes mellitus    sees Dr. Chalmers Cater   . Diverticulosis of colon (without mention of hemorrhage) 09/26/2004,09/10/2006  . Gastritis   . GERD (gastroesophageal reflux disease)   . Hemorrhoids   . Hepatic cirrhosis ([place])   . Hepatic steatosis   . Hiatal hernia   . Hyperglycemia   . Hyperlipidemia   . Hypertension   . IBS (irritable bowel syndrome)    . Inflammatory polyps of colon ([place])   . Iron deficiency anemia   . Malignant neoplasm of colon, unspecified site 07/08/2003  . Personal history of chemotherapy   . Personal history of radiation therapy   . Sleep apnea    sees Dr. Gwenette Greet   . Transaminase or LDH elevation    sees Dr. Zenovia Jarred    Past Surgical History:  Procedure Laterality Date  . bladder cancer 2001    . BREAST BIOPSY    . BREAST SURGERY  2001   lymph nodes removed and rt mastectomy  . CARPAL TUNNEL RELEASE    . [place]   for diverticulitis  . COLONOSCOPY    . ESOPHAGEAL DILATION    . incisonal hernia    . IR GENERIC HISTORICAL  04/25/2016   IR RADIOLOGIST EVAL & MGMT 04/25/2016 [name], MD [facility] INTERV RAD  . IR GENERIC HISTORICAL  08/01/2016   IR 3D INDEPENDENT [facility]ST 08/01/2016 [facility]  . IR GENERIC HISTORICAL  08/01/2016   IR ANGIOGRAM VISCERAL SELECTIVE 08/01/2016 [facility]  . IR GENERIC HISTORICAL  08/01/2016   IR EMBO TUMOR ORGAN ISCHEMIA INFARCT INC GUIDE ROADMAPPING 08/01/2016 [facility]  . IR GENERIC HISTORICAL  08/01/2016   IR US GUIDE VASC ACCESS RIGHT 08/01/2016 [facility]  . IR GENERIC HISTORICAL  08/01/2016   IR ANGIOGRAM SELECTIVE EACH ADDITIONAL VESSEL 08/01/2016 [facility]  .  IR GENERIC HISTORICAL  08/01/2016   IR ANGIOGRAM SELECTIVE EACH ADDITIONAL VESSEL 08/01/2016 [facility]  . IR RADIOLOGIST EVAL & MGMT  08/17/2016  . IR RADIOLOGIST EVAL & MGMT  07/19/2016  . MASTECTOMY    . mastectomy and biopsy    . RADIOFREQUENCY ABLATION N/A 09/01/2016   Procedure: MICROWAVE THERMAL ABLATION;  Surgeon: [name], MD;  Location[facility];  Service: Anesthesiology;  Laterality: N/A;  . sigmoid colectomy 2005     for cancer  . squamous cell CA  R  calf  2008      reports that she quit smoking about 41 years ago. Her smoking use included Cigarettes. She has a 40.00 pack-year smoking history. She has never used smokeless tobacco. She reports that she does not drink  alcohol or use drugs. family history includes Breast cancer in her sister and sister; Diabetes in her mother, sister, and sister; Esophageal cancer in her son; Hypertension in her sister, sister, and sister; Inflammatory bowel disease in her daughter; Lung cancer in her father; Lymphoma in her sister; Skin cancer in her mother. No Known Allergies    Outpatient Encounter Prescriptions as of 12/05/2016  Medication Sig  . aspirin EC 81 MG tablet Take 81 mg by mouth daily.   . Calcium Carbonate-Vitamin D (CALCIUM 600+D) 600-400 MG-UNIT tablet Take 1 tablet by mouth daily.  . diphenhydrAMINE (BENADRYL) 25 MG tablet Take 25 mg by mouth at bedtime.  . docusate sodium (COLACE) 100 MG capsule Take 100 mg by mouth daily as needed for mild constipation.  . ferrous sulfate 325 (65 FE) MG tablet Take 1 tablet (325 mg total) by mouth 3 (three) times daily with meals.  . furosemide (LASIX) 40 MG tablet Take 20 mg by mouth 3 (three) times daily.  Marland Kitchen glucose blood (ONETOUCH VERIO) test strip Use to check blood sugar 3 times per day. Dx code E11.9  . insulin aspart (NOVOLOG FLEXPEN) 100 UNIT/ML FlexPen 3 units with meals, for any blood sugar over 300  . insulin NPH Human (HUMULIN N,NOVOLIN N) 100 UNIT/ML injection 40 units each morning, and 20 units each evening.  . lactose free nutrition (BOOST PLUS) LIQD Take 237 mLs by mouth 3 (three) times daily with meals.  . lactulose (CHRONULAC) 10 GM/15ML solution Take 30 mLs (20 g total) by mouth 2 (two) times daily.  Marland Kitchen loratadine (CLARITIN) 10 MG tablet Take 10 mg by mouth daily.  . meclizine (ANTIVERT) 25 MG tablet Take 1 tablet (25 mg total) by mouth every 4 (four) hours as needed for dizziness.  . nadolol (CORGARD) 20 MG tablet Take 1 tablet (20 mg total) by mouth daily.  Marland Kitchen omega-3 acid ethyl esters (LOVAZA) 1 g capsule Take 1 g by mouth at bedtime.  Marland Kitchen omeprazole (PRILOSEC) 20 MG capsule TAKE 1 CAPSULE TWICE DAILY BEFORE MEALS  . ondansetron (ZOFRAN-ODT) 4 MG  disintegrating tablet Take 1 tablet (4 mg total) by mouth 3 (three) times daily as needed for nausea or vomiting.  . promethazine (PHENERGAN) 12.5 MG tablet Take 1 tablet (12.5 mg total) by mouth every 6 (six) hours as needed for nausea or vomiting.  . rifaximin (XIFAXAN) 550 MG TABS tablet Take 1 tablet (550 mg total) by mouth 2 (two) times daily.  Marland Kitchen spironolactone (ALDACTONE) 50 MG tablet Take 1 tablet (50 mg total) by mouth 2 (two) times daily.  . traMADol (ULTRAM) 50 MG tablet TAKE ONE TABLET BY MOUTH EVERY 4 HOURS AS NEEDED (Patient taking differently: Take 1 tablet every  four hours as needed for pain)   No facility-administered encounter medications on file as of 12/05/2016.      REVIEW OF SYSTEMS  : All other systems reviewed and negative except where noted in the History of Present Illness.   PHYSICAL EXAM: BP 110/70 (BP Location: Left Arm, Patient Position: Sitting)   Pulse 77   Ht 5\' 5"  (1.651 m)   Wt 149 lb 14.4 oz (68 kg)   SpO2 96%   BMI 24.94 kg/m  General: Well developed white female in no acute distress Head: Normocephalic and atraumatic Eyes:  Sclerae anicteric, conjunctiva pink. Ears: Normal auditory acuity Lungs: Clear throughout to auscultation; no increased WOB. Heart: Regular rate and rhythm; no M/R/G. Abdomen: Soft, non-distended.  BS present.  Mild RUQ TTP. Musculoskeletal: Symmetrical with no gross deformities  Skin: No lesions on visible extremities Extremities: No edema  Neurological: Alert oriented x 4, grossly non-focal.  No asterixis noted. Psychological:  Alert and cooperative. Normal mood and affect  ASSESSMENT AND PLAN: 76 year old white female with multiple serious medical problems #1 hepatocellular carcinoma-status post chemoembolization and microwave ablation March and April 2018.  Significantly advanced on most recent imaging.   #2 [name] cirrhosis complicated by encephalopathy:  Recently hospitalized for this but improved with lactulose 30 mL  BID and xifaxan 550 mg BID.  Can increase/ give extra dose if needed.  Ideally should be having about 3 soft stools daily.  *Unfortunately her prognosis is very poor.  I do not think we have much else to change/add at this time. *Can keep follow-up with Dr. Hilarie Fredrickson in September.  CC:  [name], MD   Addendum: Reviewed and agree with  management. [name], [name], MD

## 2016-12-05 NOTE — Patient Instructions (Addendum)
Keep your follow up with Dr. Hilarie Fredrickson

## 2016-12-06 ENCOUNTER — Telehealth: Payer: Self-pay | Admitting: Gastroenterology

## 2016-12-06 NOTE — Telephone Encounter (Signed)
On call note. Pts daughter reports pt is exhibiting fatigue, slow speech, lethargy, less frequent bowel movements today. She feels that her mother has worsening HE as she has exhibited the same symptoms previously when HE has worsened. Pt had office visit with JZ yesterday however these symptoms were not present yesterday. No blood work yesterday.  Advised to increase lactulose to tid from bid, with extra doses now and first thing tomorrow morning. If symptoms worsen, do not improve, to ED for evaluation. Consider blood work tomorrow however will defer plans to Dr. Hilarie Fredrickson.

## 2016-12-07 ENCOUNTER — Telehealth: Payer: Self-pay | Admitting: Internal Medicine

## 2016-12-07 NOTE — Telephone Encounter (Signed)
See additional phone note. 

## 2016-12-07 NOTE — Telephone Encounter (Signed)
Agree with Dr. Fuller Plan with increased lactulose until BMs then stay at TID.  Once improved BMs can be titrated to 3-4 daily. Would use both rifaximin 550 BID and lactulose as above If mentation does not improve to baseline, then she will need to be readmitted Also, it is likely time to consider hospice services.  This will be a difficult step for them, and I do not know how much longer she has, but hospice services can come and go as needed and often are available longer than 6 months. Hospice RNs at home with her would like be very beneficial to her and her family I am happy to discuss this more again with them if they want

## 2016-12-07 NOTE — Telephone Encounter (Signed)
Left message for pt's daughter to call back.

## 2016-12-08 NOTE — Telephone Encounter (Signed)
Left message for pts daughter regarding Dr. Quentin Mulling recommendations regarding lactulose and xifaxan. Dr. Hilarie Fredrickson will discuss with family regarding hospice.

## 2016-12-10 ENCOUNTER — Telehealth: Payer: Self-pay | Admitting: Gastroenterology

## 2016-12-10 NOTE — Telephone Encounter (Signed)
Tammy (Daughter) called complaining of decline of her mother's health. He is more lethargic and she feels she is getting dehydrated. She currently not admitted to hospice care and doesn't even have home health services. Tammy wanted to avoid bringing patient to ER and she understands that her mother's health has deteriorated and is not curative. I discussed regarding palliative care and Tammy was open to it. Called Palliative care RN and requested their service. They will try to call Tammy and assess patient at home today if they can and admit patient to Hospice care.

## 2016-12-11 ENCOUNTER — Telehealth: Payer: Self-pay | Admitting: Internal Medicine

## 2016-12-11 NOTE — Telephone Encounter (Signed)
Let the hospice nurse know that Dr. Hilarie Fredrickson will handle any GI issues but everything else needs to be handled by the hospice physician.

## 2016-12-11 NOTE — Telephone Encounter (Signed)
Thank you [facility] health and hospice is very appropriate

## 2016-12-12 ENCOUNTER — Telehealth: Payer: Self-pay | Admitting: Endocrinology

## 2016-12-12 DIAGNOSIS — E785 Hyperlipidemia, unspecified: Secondary | ICD-10-CM | POA: Diagnosis not present

## 2016-12-12 DIAGNOSIS — J309 Allergic rhinitis, unspecified: Secondary | ICD-10-CM | POA: Diagnosis not present

## 2016-12-12 DIAGNOSIS — K7291 Hepatic failure, unspecified with coma: Secondary | ICD-10-CM | POA: Diagnosis not present

## 2016-12-12 DIAGNOSIS — I10 Essential (primary) hypertension: Secondary | ICD-10-CM | POA: Diagnosis not present

## 2016-12-12 DIAGNOSIS — E1159 Type 2 diabetes mellitus with other circulatory complications: Secondary | ICD-10-CM | POA: Diagnosis not present

## 2016-12-12 DIAGNOSIS — I5030 Unspecified diastolic (congestive) heart failure: Secondary | ICD-10-CM | POA: Diagnosis not present

## 2016-12-12 DIAGNOSIS — Z85038 Personal history of other malignant neoplasm of large intestine: Secondary | ICD-10-CM | POA: Diagnosis not present

## 2016-12-12 DIAGNOSIS — C228 Malignant neoplasm of liver, primary, unspecified as to type: Secondary | ICD-10-CM | POA: Diagnosis not present

## 2016-12-12 DIAGNOSIS — R188 Other ascites: Secondary | ICD-10-CM | POA: Diagnosis not present

## 2016-12-12 DIAGNOSIS — N183 Chronic kidney disease, stage 3 (moderate): Secondary | ICD-10-CM | POA: Diagnosis not present

## 2016-12-12 DIAGNOSIS — Z8551 Personal history of malignant neoplasm of bladder: Secondary | ICD-10-CM | POA: Diagnosis not present

## 2016-12-12 DIAGNOSIS — Z853 Personal history of malignant neoplasm of breast: Secondary | ICD-10-CM | POA: Diagnosis not present

## 2016-12-12 DIAGNOSIS — K7460 Unspecified cirrhosis of liver: Secondary | ICD-10-CM | POA: Diagnosis not present

## 2016-12-12 NOTE — Telephone Encounter (Signed)
Called and clarified with Isreal from [place].

## 2016-12-12 NOTE — Telephone Encounter (Signed)
Pharmacy called in reference to directions on Rx for insulin aspart (NOVOLOG FLEXPEN) 100 UNIT/ML FlexPen. Please call pharmacy and advise.  Ref # [id number]

## 2016-12-13 DIAGNOSIS — C228 Malignant neoplasm of liver, primary, unspecified as to type: Secondary | ICD-10-CM | POA: Diagnosis not present

## 2016-12-13 DIAGNOSIS — K7291 Hepatic failure, unspecified with coma: Secondary | ICD-10-CM | POA: Diagnosis not present

## 2016-12-13 DIAGNOSIS — N183 Chronic kidney disease, stage 3 (moderate): Secondary | ICD-10-CM | POA: Diagnosis not present

## 2016-12-13 DIAGNOSIS — I5030 Unspecified diastolic (congestive) heart failure: Secondary | ICD-10-CM | POA: Diagnosis not present

## 2016-12-13 DIAGNOSIS — R188 Other ascites: Secondary | ICD-10-CM | POA: Diagnosis not present

## 2016-12-13 DIAGNOSIS — K7460 Unspecified cirrhosis of liver: Secondary | ICD-10-CM | POA: Diagnosis not present

## 2016-12-14 DIAGNOSIS — C228 Malignant neoplasm of liver, primary, unspecified as to type: Secondary | ICD-10-CM | POA: Diagnosis not present

## 2016-12-14 DIAGNOSIS — N183 Chronic kidney disease, stage 3 (moderate): Secondary | ICD-10-CM | POA: Diagnosis not present

## 2016-12-14 DIAGNOSIS — I5030 Unspecified diastolic (congestive) heart failure: Secondary | ICD-10-CM | POA: Diagnosis not present

## 2016-12-14 DIAGNOSIS — K7291 Hepatic failure, unspecified with coma: Secondary | ICD-10-CM | POA: Diagnosis not present

## 2016-12-14 DIAGNOSIS — K7460 Unspecified cirrhosis of liver: Secondary | ICD-10-CM | POA: Diagnosis not present

## 2016-12-14 DIAGNOSIS — R188 Other ascites: Secondary | ICD-10-CM | POA: Diagnosis not present

## 2016-12-15 DIAGNOSIS — R188 Other ascites: Secondary | ICD-10-CM | POA: Diagnosis not present

## 2016-12-15 DIAGNOSIS — N183 Chronic kidney disease, stage 3 (moderate): Secondary | ICD-10-CM | POA: Diagnosis not present

## 2016-12-15 DIAGNOSIS — K7291 Hepatic failure, unspecified with coma: Secondary | ICD-10-CM | POA: Diagnosis not present

## 2016-12-15 DIAGNOSIS — I5030 Unspecified diastolic (congestive) heart failure: Secondary | ICD-10-CM | POA: Diagnosis not present

## 2016-12-15 DIAGNOSIS — K7460 Unspecified cirrhosis of liver: Secondary | ICD-10-CM | POA: Diagnosis not present

## 2016-12-15 DIAGNOSIS — C228 Malignant neoplasm of liver, primary, unspecified as to type: Secondary | ICD-10-CM | POA: Diagnosis not present

## 2016-12-19 DIAGNOSIS — N183 Chronic kidney disease, stage 3 (moderate): Secondary | ICD-10-CM | POA: Diagnosis not present

## 2016-12-19 DIAGNOSIS — K7291 Hepatic failure, unspecified with coma: Secondary | ICD-10-CM | POA: Diagnosis not present

## 2016-12-19 DIAGNOSIS — K7460 Unspecified cirrhosis of liver: Secondary | ICD-10-CM | POA: Diagnosis not present

## 2016-12-19 DIAGNOSIS — I5030 Unspecified diastolic (congestive) heart failure: Secondary | ICD-10-CM | POA: Diagnosis not present

## 2016-12-19 DIAGNOSIS — C228 Malignant neoplasm of liver, primary, unspecified as to type: Secondary | ICD-10-CM | POA: Diagnosis not present

## 2016-12-19 DIAGNOSIS — R188 Other ascites: Secondary | ICD-10-CM | POA: Diagnosis not present

## 2016-12-21 DIAGNOSIS — C228 Malignant neoplasm of liver, primary, unspecified as to type: Secondary | ICD-10-CM | POA: Diagnosis not present

## 2016-12-21 DIAGNOSIS — K7291 Hepatic failure, unspecified with coma: Secondary | ICD-10-CM | POA: Diagnosis not present

## 2016-12-21 DIAGNOSIS — R188 Other ascites: Secondary | ICD-10-CM | POA: Diagnosis not present

## 2016-12-21 DIAGNOSIS — K7460 Unspecified cirrhosis of liver: Secondary | ICD-10-CM | POA: Diagnosis not present

## 2016-12-21 DIAGNOSIS — N183 Chronic kidney disease, stage 3 (moderate): Secondary | ICD-10-CM | POA: Diagnosis not present

## 2016-12-21 DIAGNOSIS — I5030 Unspecified diastolic (congestive) heart failure: Secondary | ICD-10-CM | POA: Diagnosis not present

## 2016-12-25 DIAGNOSIS — K7460 Unspecified cirrhosis of liver: Secondary | ICD-10-CM | POA: Diagnosis not present

## 2016-12-25 DIAGNOSIS — C228 Malignant neoplasm of liver, primary, unspecified as to type: Secondary | ICD-10-CM | POA: Diagnosis not present

## 2016-12-25 DIAGNOSIS — I5030 Unspecified diastolic (congestive) heart failure: Secondary | ICD-10-CM | POA: Diagnosis not present

## 2016-12-25 DIAGNOSIS — R188 Other ascites: Secondary | ICD-10-CM | POA: Diagnosis not present

## 2016-12-25 DIAGNOSIS — K7291 Hepatic failure, unspecified with coma: Secondary | ICD-10-CM | POA: Diagnosis not present

## 2016-12-25 DIAGNOSIS — N183 Chronic kidney disease, stage 3 (moderate): Secondary | ICD-10-CM | POA: Diagnosis not present

## 2016-12-29 DIAGNOSIS — C228 Malignant neoplasm of liver, primary, unspecified as to type: Secondary | ICD-10-CM | POA: Diagnosis not present

## 2016-12-29 DIAGNOSIS — R188 Other ascites: Secondary | ICD-10-CM | POA: Diagnosis not present

## 2016-12-29 DIAGNOSIS — I5030 Unspecified diastolic (congestive) heart failure: Secondary | ICD-10-CM | POA: Diagnosis not present

## 2016-12-29 DIAGNOSIS — K7291 Hepatic failure, unspecified with coma: Secondary | ICD-10-CM | POA: Diagnosis not present

## 2016-12-29 DIAGNOSIS — N183 Chronic kidney disease, stage 3 (moderate): Secondary | ICD-10-CM | POA: Diagnosis not present

## 2016-12-29 DIAGNOSIS — K7460 Unspecified cirrhosis of liver: Secondary | ICD-10-CM | POA: Diagnosis not present

## 2017-01-01 DIAGNOSIS — K7291 Hepatic failure, unspecified with coma: Secondary | ICD-10-CM | POA: Diagnosis not present

## 2017-01-01 DIAGNOSIS — K7460 Unspecified cirrhosis of liver: Secondary | ICD-10-CM | POA: Diagnosis not present

## 2017-01-01 DIAGNOSIS — I5030 Unspecified diastolic (congestive) heart failure: Secondary | ICD-10-CM | POA: Diagnosis not present

## 2017-01-01 DIAGNOSIS — C228 Malignant neoplasm of liver, primary, unspecified as to type: Secondary | ICD-10-CM | POA: Diagnosis not present

## 2017-01-01 DIAGNOSIS — R188 Other ascites: Secondary | ICD-10-CM | POA: Diagnosis not present

## 2017-01-01 DIAGNOSIS — N183 Chronic kidney disease, stage 3 (moderate): Secondary | ICD-10-CM | POA: Diagnosis not present

## 2017-01-02 DIAGNOSIS — N183 Chronic kidney disease, stage 3 (moderate): Secondary | ICD-10-CM | POA: Diagnosis not present

## 2017-01-02 DIAGNOSIS — C228 Malignant neoplasm of liver, primary, unspecified as to type: Secondary | ICD-10-CM | POA: Diagnosis not present

## 2017-01-02 DIAGNOSIS — R188 Other ascites: Secondary | ICD-10-CM | POA: Diagnosis not present

## 2017-01-02 DIAGNOSIS — K7291 Hepatic failure, unspecified with coma: Secondary | ICD-10-CM | POA: Diagnosis not present

## 2017-01-02 DIAGNOSIS — K7460 Unspecified cirrhosis of liver: Secondary | ICD-10-CM | POA: Diagnosis not present

## 2017-01-02 DIAGNOSIS — I5030 Unspecified diastolic (congestive) heart failure: Secondary | ICD-10-CM | POA: Diagnosis not present

## 2017-01-03 DIAGNOSIS — K7460 Unspecified cirrhosis of liver: Secondary | ICD-10-CM | POA: Diagnosis not present

## 2017-01-03 DIAGNOSIS — I5030 Unspecified diastolic (congestive) heart failure: Secondary | ICD-10-CM | POA: Diagnosis not present

## 2017-01-03 DIAGNOSIS — R188 Other ascites: Secondary | ICD-10-CM | POA: Diagnosis not present

## 2017-01-03 DIAGNOSIS — C228 Malignant neoplasm of liver, primary, unspecified as to type: Secondary | ICD-10-CM | POA: Diagnosis not present

## 2017-01-03 DIAGNOSIS — N183 Chronic kidney disease, stage 3 (moderate): Secondary | ICD-10-CM | POA: Diagnosis not present

## 2017-01-03 DIAGNOSIS — K7291 Hepatic failure, unspecified with coma: Secondary | ICD-10-CM | POA: Diagnosis not present

## 2017-01-04 DIAGNOSIS — R188 Other ascites: Secondary | ICD-10-CM | POA: Diagnosis not present

## 2017-01-04 DIAGNOSIS — K7291 Hepatic failure, unspecified with coma: Secondary | ICD-10-CM | POA: Diagnosis not present

## 2017-01-04 DIAGNOSIS — N183 Chronic kidney disease, stage 3 (moderate): Secondary | ICD-10-CM | POA: Diagnosis not present

## 2017-01-04 DIAGNOSIS — I5030 Unspecified diastolic (congestive) heart failure: Secondary | ICD-10-CM | POA: Diagnosis not present

## 2017-01-04 DIAGNOSIS — C228 Malignant neoplasm of liver, primary, unspecified as to type: Secondary | ICD-10-CM | POA: Diagnosis not present

## 2017-01-04 DIAGNOSIS — K7460 Unspecified cirrhosis of liver: Secondary | ICD-10-CM | POA: Diagnosis not present

## 2017-01-06 DIAGNOSIS — K7291 Hepatic failure, unspecified with coma: Secondary | ICD-10-CM | POA: Diagnosis not present

## 2017-01-06 DIAGNOSIS — Z85038 Personal history of other malignant neoplasm of large intestine: Secondary | ICD-10-CM | POA: Diagnosis not present

## 2017-01-06 DIAGNOSIS — I10 Essential (primary) hypertension: Secondary | ICD-10-CM | POA: Diagnosis not present

## 2017-01-06 DIAGNOSIS — Z853 Personal history of malignant neoplasm of breast: Secondary | ICD-10-CM | POA: Diagnosis not present

## 2017-01-06 DIAGNOSIS — R188 Other ascites: Secondary | ICD-10-CM | POA: Diagnosis not present

## 2017-01-06 DIAGNOSIS — N183 Chronic kidney disease, stage 3 (moderate): Secondary | ICD-10-CM | POA: Diagnosis not present

## 2017-01-06 DIAGNOSIS — I5030 Unspecified diastolic (congestive) heart failure: Secondary | ICD-10-CM | POA: Diagnosis not present

## 2017-01-06 DIAGNOSIS — E785 Hyperlipidemia, unspecified: Secondary | ICD-10-CM | POA: Diagnosis not present

## 2017-01-06 DIAGNOSIS — K7460 Unspecified cirrhosis of liver: Secondary | ICD-10-CM | POA: Diagnosis not present

## 2017-01-06 DIAGNOSIS — Z8551 Personal history of malignant neoplasm of bladder: Secondary | ICD-10-CM | POA: Diagnosis not present

## 2017-01-06 DIAGNOSIS — C228 Malignant neoplasm of liver, primary, unspecified as to type: Secondary | ICD-10-CM | POA: Diagnosis not present

## 2017-01-06 DIAGNOSIS — J309 Allergic rhinitis, unspecified: Secondary | ICD-10-CM | POA: Diagnosis not present

## 2017-01-06 DIAGNOSIS — E1159 Type 2 diabetes mellitus with other circulatory complications: Secondary | ICD-10-CM | POA: Diagnosis not present

## 2017-01-09 DIAGNOSIS — I5030 Unspecified diastolic (congestive) heart failure: Secondary | ICD-10-CM | POA: Diagnosis not present

## 2017-01-09 DIAGNOSIS — K7460 Unspecified cirrhosis of liver: Secondary | ICD-10-CM | POA: Diagnosis not present

## 2017-01-09 DIAGNOSIS — R188 Other ascites: Secondary | ICD-10-CM | POA: Diagnosis not present

## 2017-01-09 DIAGNOSIS — N183 Chronic kidney disease, stage 3 (moderate): Secondary | ICD-10-CM | POA: Diagnosis not present

## 2017-01-09 DIAGNOSIS — C228 Malignant neoplasm of liver, primary, unspecified as to type: Secondary | ICD-10-CM | POA: Diagnosis not present

## 2017-01-09 DIAGNOSIS — K7291 Hepatic failure, unspecified with coma: Secondary | ICD-10-CM | POA: Diagnosis not present

## 2017-01-10 ENCOUNTER — Telehealth: Payer: Self-pay | Admitting: Endocrinology

## 2017-01-10 ENCOUNTER — Ambulatory Visit: Admitting: Endocrinology

## 2017-01-10 NOTE — Telephone Encounter (Signed)
Patient is now in hospice. No more appointments needed.

## 2017-01-10 NOTE — Telephone Encounter (Signed)
Routing to you °

## 2017-01-11 ENCOUNTER — Ambulatory Visit: Payer: Medicare Other | Admitting: Oncology

## 2017-01-11 DIAGNOSIS — I5030 Unspecified diastolic (congestive) heart failure: Secondary | ICD-10-CM | POA: Diagnosis not present

## 2017-01-11 DIAGNOSIS — N183 Chronic kidney disease, stage 3 (moderate): Secondary | ICD-10-CM | POA: Diagnosis not present

## 2017-01-11 DIAGNOSIS — K7460 Unspecified cirrhosis of liver: Secondary | ICD-10-CM | POA: Diagnosis not present

## 2017-01-11 DIAGNOSIS — C228 Malignant neoplasm of liver, primary, unspecified as to type: Secondary | ICD-10-CM | POA: Diagnosis not present

## 2017-01-11 DIAGNOSIS — K7291 Hepatic failure, unspecified with coma: Secondary | ICD-10-CM | POA: Diagnosis not present

## 2017-01-11 DIAGNOSIS — R188 Other ascites: Secondary | ICD-10-CM | POA: Diagnosis not present

## 2017-01-12 DIAGNOSIS — I5030 Unspecified diastolic (congestive) heart failure: Secondary | ICD-10-CM | POA: Diagnosis not present

## 2017-01-12 DIAGNOSIS — K7291 Hepatic failure, unspecified with coma: Secondary | ICD-10-CM | POA: Diagnosis not present

## 2017-01-12 DIAGNOSIS — R188 Other ascites: Secondary | ICD-10-CM | POA: Diagnosis not present

## 2017-01-12 DIAGNOSIS — N183 Chronic kidney disease, stage 3 (moderate): Secondary | ICD-10-CM | POA: Diagnosis not present

## 2017-01-12 DIAGNOSIS — K7460 Unspecified cirrhosis of liver: Secondary | ICD-10-CM | POA: Diagnosis not present

## 2017-01-12 DIAGNOSIS — C228 Malignant neoplasm of liver, primary, unspecified as to type: Secondary | ICD-10-CM | POA: Diagnosis not present

## 2017-01-15 DIAGNOSIS — C228 Malignant neoplasm of liver, primary, unspecified as to type: Secondary | ICD-10-CM | POA: Diagnosis not present

## 2017-01-15 DIAGNOSIS — N183 Chronic kidney disease, stage 3 (moderate): Secondary | ICD-10-CM | POA: Diagnosis not present

## 2017-01-15 DIAGNOSIS — K7291 Hepatic failure, unspecified with coma: Secondary | ICD-10-CM | POA: Diagnosis not present

## 2017-01-15 DIAGNOSIS — R188 Other ascites: Secondary | ICD-10-CM | POA: Diagnosis not present

## 2017-01-15 DIAGNOSIS — K7460 Unspecified cirrhosis of liver: Secondary | ICD-10-CM | POA: Diagnosis not present

## 2017-01-15 DIAGNOSIS — I5030 Unspecified diastolic (congestive) heart failure: Secondary | ICD-10-CM | POA: Diagnosis not present

## 2017-01-16 ENCOUNTER — Ambulatory Visit: Payer: Medicare Other | Admitting: Oncology

## 2017-01-16 DIAGNOSIS — R188 Other ascites: Secondary | ICD-10-CM | POA: Diagnosis not present

## 2017-01-16 DIAGNOSIS — N183 Chronic kidney disease, stage 3 (moderate): Secondary | ICD-10-CM | POA: Diagnosis not present

## 2017-01-16 DIAGNOSIS — I5030 Unspecified diastolic (congestive) heart failure: Secondary | ICD-10-CM | POA: Diagnosis not present

## 2017-01-16 DIAGNOSIS — K7460 Unspecified cirrhosis of liver: Secondary | ICD-10-CM | POA: Diagnosis not present

## 2017-01-16 DIAGNOSIS — K7291 Hepatic failure, unspecified with coma: Secondary | ICD-10-CM | POA: Diagnosis not present

## 2017-01-16 DIAGNOSIS — C228 Malignant neoplasm of liver, primary, unspecified as to type: Secondary | ICD-10-CM | POA: Diagnosis not present

## 2017-01-17 DIAGNOSIS — C228 Malignant neoplasm of liver, primary, unspecified as to type: Secondary | ICD-10-CM | POA: Diagnosis not present

## 2017-01-17 DIAGNOSIS — N183 Chronic kidney disease, stage 3 (moderate): Secondary | ICD-10-CM | POA: Diagnosis not present

## 2017-01-17 DIAGNOSIS — K7291 Hepatic failure, unspecified with coma: Secondary | ICD-10-CM | POA: Diagnosis not present

## 2017-01-17 DIAGNOSIS — R188 Other ascites: Secondary | ICD-10-CM | POA: Diagnosis not present

## 2017-01-17 DIAGNOSIS — I5030 Unspecified diastolic (congestive) heart failure: Secondary | ICD-10-CM | POA: Diagnosis not present

## 2017-01-17 DIAGNOSIS — K7460 Unspecified cirrhosis of liver: Secondary | ICD-10-CM | POA: Diagnosis not present

## 2017-01-18 DIAGNOSIS — R188 Other ascites: Secondary | ICD-10-CM | POA: Diagnosis not present

## 2017-01-18 DIAGNOSIS — C228 Malignant neoplasm of liver, primary, unspecified as to type: Secondary | ICD-10-CM | POA: Diagnosis not present

## 2017-01-18 DIAGNOSIS — I5030 Unspecified diastolic (congestive) heart failure: Secondary | ICD-10-CM | POA: Diagnosis not present

## 2017-01-18 DIAGNOSIS — N183 Chronic kidney disease, stage 3 (moderate): Secondary | ICD-10-CM | POA: Diagnosis not present

## 2017-01-18 DIAGNOSIS — K7291 Hepatic failure, unspecified with coma: Secondary | ICD-10-CM | POA: Diagnosis not present

## 2017-01-18 DIAGNOSIS — K7460 Unspecified cirrhosis of liver: Secondary | ICD-10-CM | POA: Diagnosis not present

## 2017-01-19 ENCOUNTER — Telehealth: Payer: Self-pay | Admitting: *Deleted

## 2017-01-19 ENCOUNTER — Telehealth: Payer: Self-pay | Admitting: Internal Medicine

## 2017-01-19 DIAGNOSIS — R188 Other ascites: Secondary | ICD-10-CM | POA: Diagnosis not present

## 2017-01-19 DIAGNOSIS — I5030 Unspecified diastolic (congestive) heart failure: Secondary | ICD-10-CM | POA: Diagnosis not present

## 2017-01-19 DIAGNOSIS — K7460 Unspecified cirrhosis of liver: Secondary | ICD-10-CM | POA: Diagnosis not present

## 2017-01-19 DIAGNOSIS — N183 Chronic kidney disease, stage 3 (moderate): Secondary | ICD-10-CM | POA: Diagnosis not present

## 2017-01-19 DIAGNOSIS — K7291 Hepatic failure, unspecified with coma: Secondary | ICD-10-CM | POA: Diagnosis not present

## 2017-01-19 DIAGNOSIS — C228 Malignant neoplasm of liver, primary, unspecified as to type: Secondary | ICD-10-CM | POA: Diagnosis not present

## 2017-01-30 ENCOUNTER — Ambulatory Visit: Payer: Medicare Other | Admitting: Internal Medicine

## 2017-02-05 NOTE — Telephone Encounter (Signed)
fyi

## 2017-02-05 NOTE — Telephone Encounter (Signed)
Hospice nurse called to notify MD of pt's death. Per [name] [name], family wanted to thank Dr. Benay Spice for his care.

## 2017-02-05 DEATH — deceased

## 2017-03-15 ENCOUNTER — Encounter: Payer: Self-pay | Admitting: Interventional Radiology

## 2019-01-28 IMAGING — MR MR ABDOMEN WO/W CM
9 of 18 series · 20 of 48 positions shown · IV contrast (multihance)
Comparison: 04/10/2016 MRI abdomen.

CLINICAL DATA: Cirrhosis. Patient presents for follow-up of
hypervascular liver lesions. Ultrasound-guided biopsy of a right
liver lobe lesion performed 07/19/2015 was negative for malignancy.

EXAM:
MRI ABDOMEN WITHOUT AND WITH CONTRAST
TECHNIQUE: Multiplanar multisequence MR imaging of the abdomen was performed
both before and after the administration of intravenous contrast.
CONTRAST:  15mL MULTIHANCE GADOBENATE DIMEGLUMINE 529 MG/ML IV SOLN

[Series 3: DWI b500 · axial · 6.0mm · 1.48mm/px · z∈[-140,+140]mm · 3 of 74 slices shown]
[im 1/74]
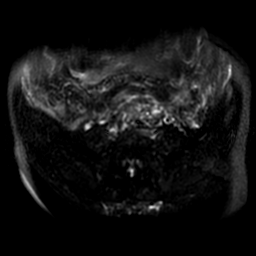
[im 37/74]
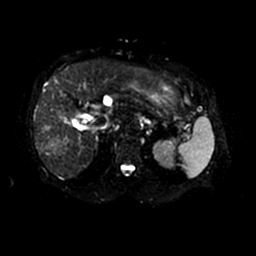
[im 74/74]
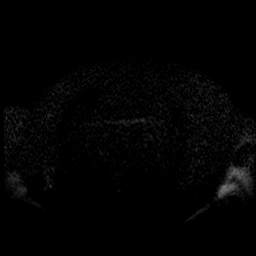

[Series 4: T2 fat-sat · axial · 5.0mm · 0.78mm/px · z∈[-125,+124]mm · 2 of 51 slices shown]
[im 1/51]
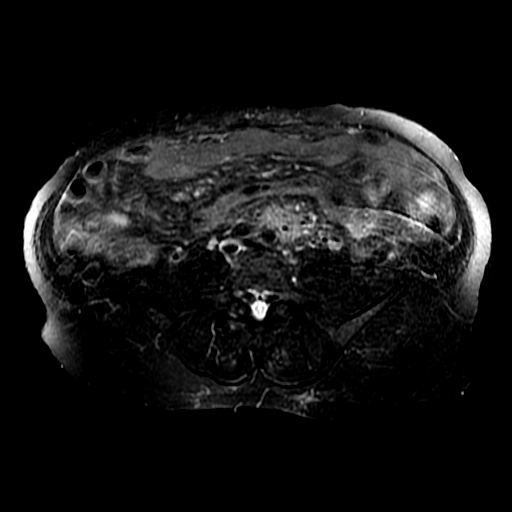
[im 51/51]
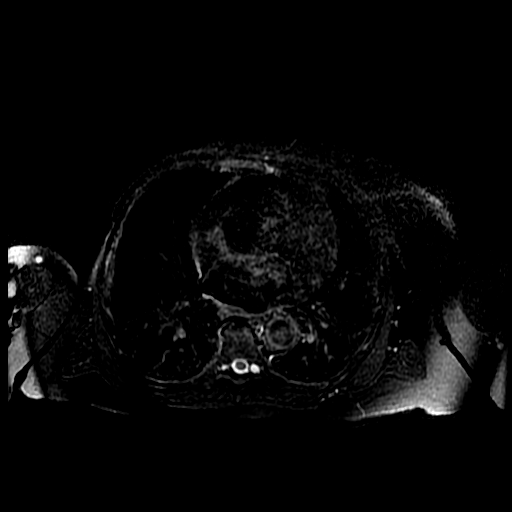

[Series 5: ax dualecho · axial · 5.0mm · 0.78mm/px · z∈[-129,+126]mm · 3 of 104 slices shown]
[im 1/104]
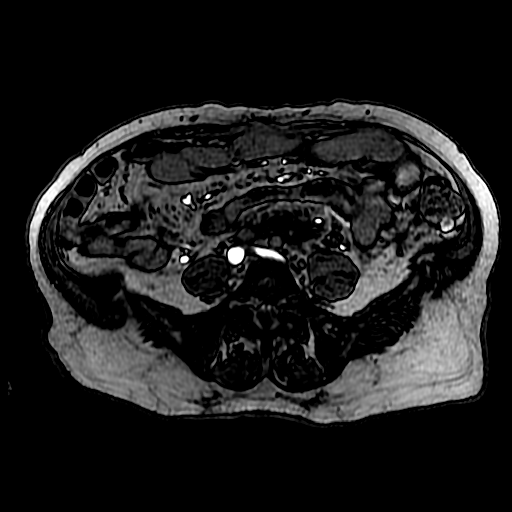
[im 52/104]
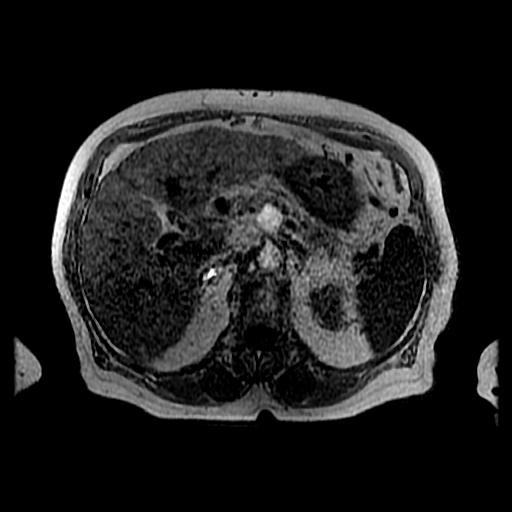
[im 104/104]
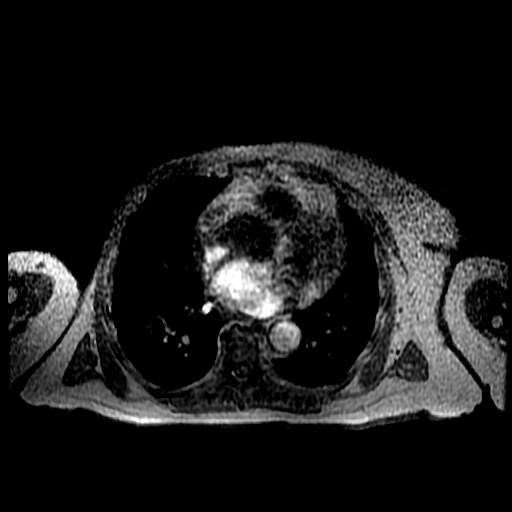

[Series 6: T2 · axial · 5.0mm · 0.78mm/px · z∈[-129,+126]mm · 2 of 52 slices shown (1 of 2)]
[im 1/52]
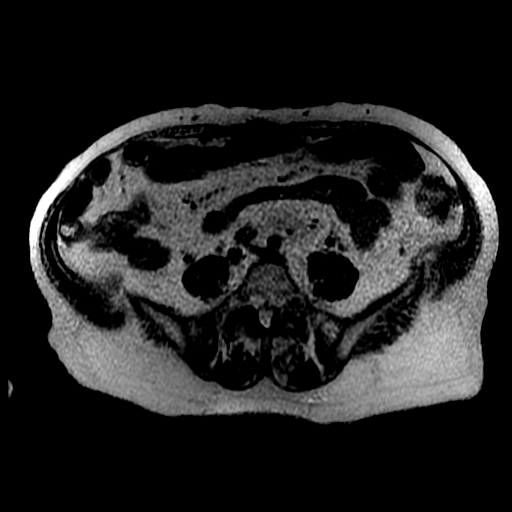
[im 52/52]
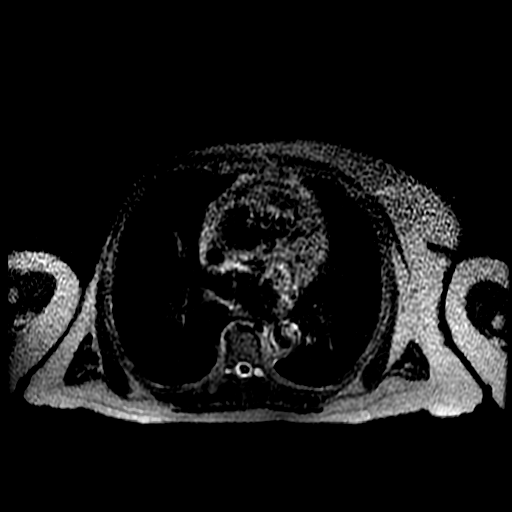

[Series 7: T2 · coronal · 5.0mm · 0.78mm/px · 2 of 48 slices shown (2 of 2)]
[im 1/48]
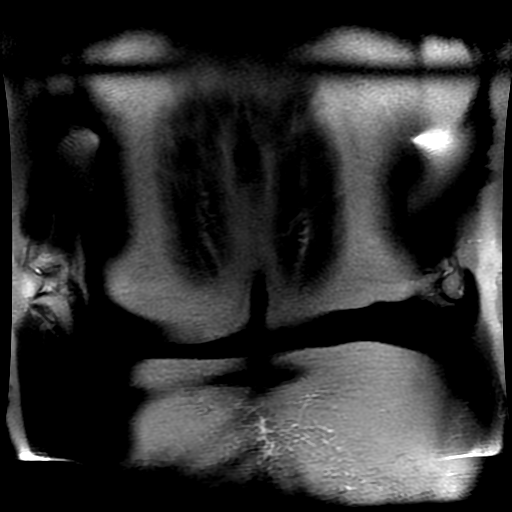
[im 48/48]
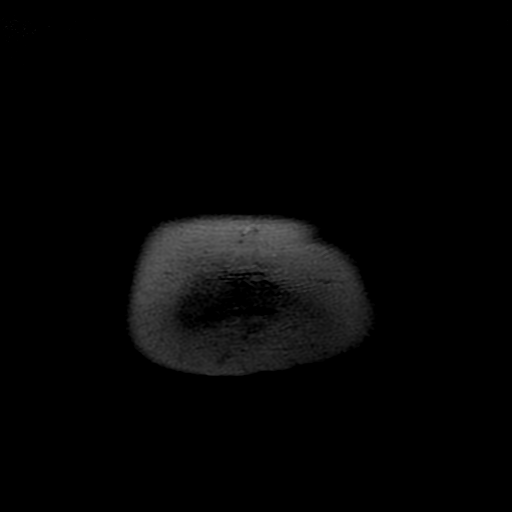

[Series 8: bSSFP · axial · 5.0mm · 0.78mm/px · z∈[-129,+126]mm · 2 of 52 slices shown]
[im 1/52]
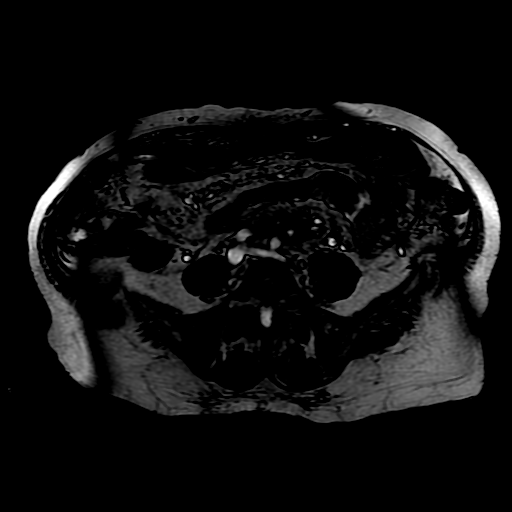
[im 52/52]
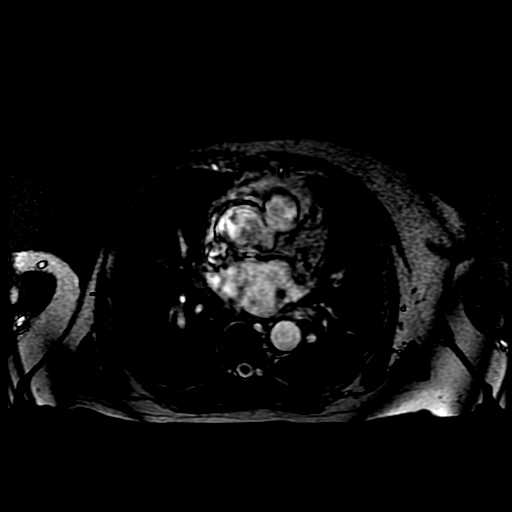

[Series 300: DWI · axial · 6.0mm · 1.48mm/px · 1 of 37 slices shown]
[im 1/37]
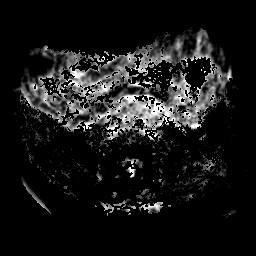

[Series 900: T1 dynamic · axial · 5.0mm · 0.78mm/px · z∈[-121,+117]mm · 3 of 96 slices shown (1 of 2)]
[im 1/96]
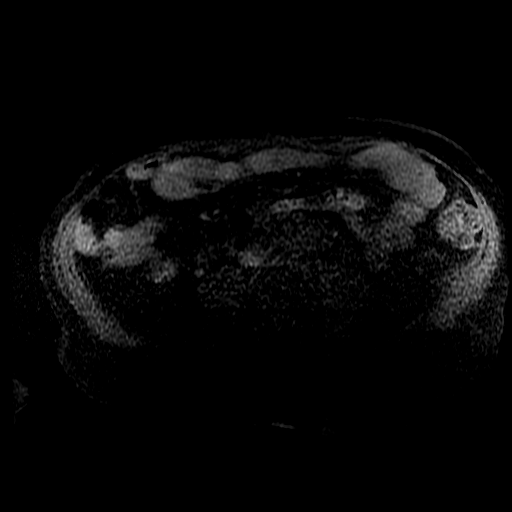
[im 48/96]
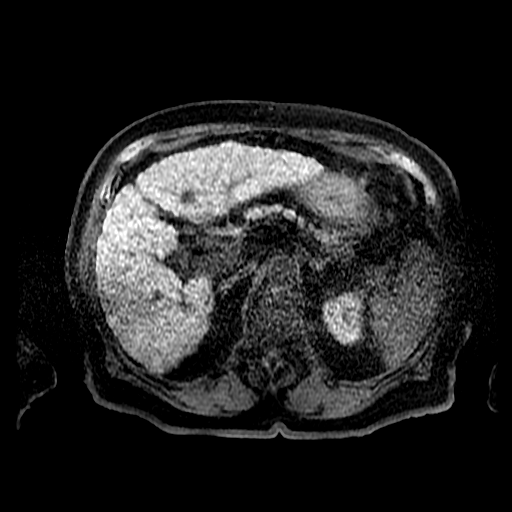
[im 96/96]
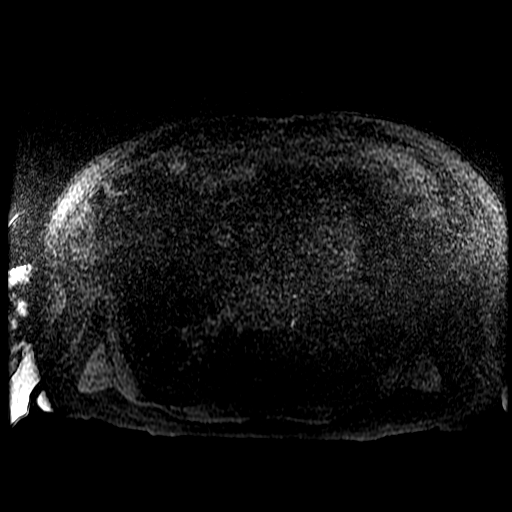

[Series 901: T1 dynamic · axial · 5.0mm · 0.78mm/px · z∈[-121,-3]mm · 2 of 96 slices shown (2 of 2)]
[im 1/96]
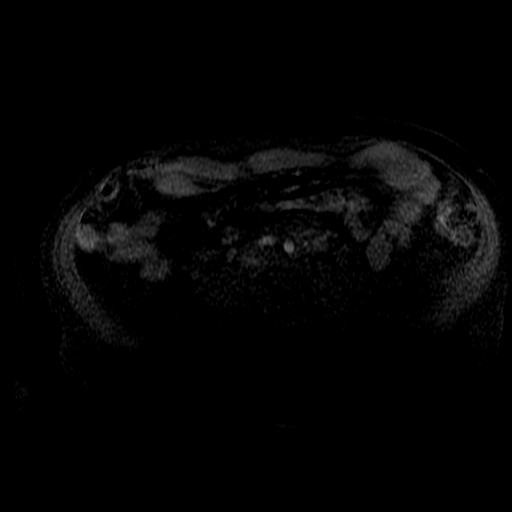
[im 48/96]
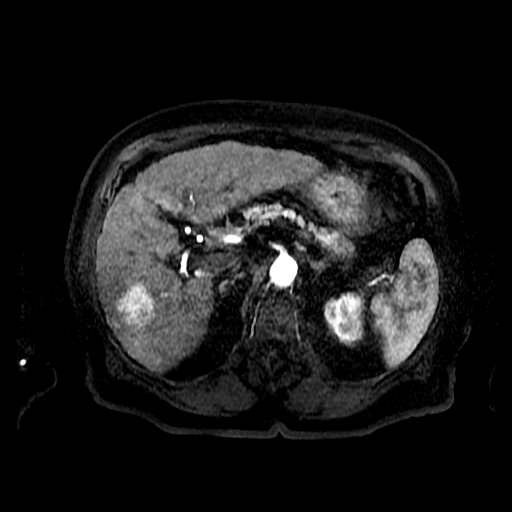

[20 of 48 positions shown; findings below may reference images not displayed]

FINDINGS: Lower chest: Grossly clear lung bases.

Hepatobiliary: Diffusely irregular liver surface and diffusely
heterogeneous liver parenchyma compatible with cirrhosis. No hepatic
steatosis. Liver lesions as follows:

- inferior right liver lobe 3.8 x 3.1 cm mass straddling segments 5
and 6 with arterial phase hyperenhancement, portal venous phase
washout and restricted diffusion, increased from 1.8 x 1.3 cm on
04/10/2016, considered Sayda Habeeb category 5 lesion

- segment 8 right liver lobe 1.3 x 1.2 cm mass with arterial phase
hyperenhancement and portal venous phase washout, previously 1.3 x
1.0 cm on 04/10/2016 and 1.3 x 1.1 cm on 07/05/2015 MRI, stable in
size, previously visualized on the 07/19/2015 abdominal sonogram,
consider Sayda Habeeb category 5us lesion

No new liver masses.

Normal gallbladder with no cholelithiasis. No biliary ductal
dilatation. Common bile duct diameter Sign mm. No
choledocholithiasis.

Pancreas: There is a 1.6 x 1.2 cm lobulated cystic pancreatic neck
mass (series 5/ image 26) without enhancement, wall thickening or
thickened internal septations, which communicates with the main
pancreatic duct, previously 1.9 x 1.4 cm on 04/10/2016 and 1.5 x
cm on 07/05/2015 MRI using similar measurement technique, slightly
decreased in size in the interval. Main pancreatic duct diameter 2
mm, within normal limits. No additional pancreatic lesions. No
pancreas divisum.

Spleen: Mild splenomegaly. Craniocaudal splenic length 13.0 cm,
stable. No splenic mass.

Adrenals/Urinary Tract: No discrete adrenal mass. No hydronephrosis.
Simple subcentimeter renal cyst in the posterior upper left kidney.
No suspicious renal masses.

Stomach/Bowel: Grossly normal stomach. Visualized small and large
bowel is normal caliber, with no bowel wall thickening.

Vascular/Lymphatic: Atherosclerotic nonaneurysmal abdominal aorta.
Patent hepatic, portal, splenic and renal veins. There is a newly
enlarged infiltrative appearing 2.2 cm lower left para-aortic node
(series 904/ image 92), incompletely visualized on this study.
Mildly enlarged 1.0 cm right pericardiophrenic lymph node (series
905/image 19), not appreciably changed back to the 04/13/2011 MRI,
suggesting a benign reactive node.

Other: No abdominal ascites or focal fluid collection. Right
mastectomy.

Musculoskeletal: No aggressive appearing focal osseous lesions.
IMPRESSION: 1. Significant interval growth of inferior right liver lobe SAMELL
category 5 lesion (definitely hepatocellular carcinoma), now 3.8 cm
and straddling segments 5 and 6 of the right liver lobe.
2. Interval stability of 1.3 cm segment 8 right liver lobe SAMELL
category 5us lesion (definitely hepatocellular carcinoma).
3. Cirrhosis.  Stable mild splenomegaly.  No ascites.
4. Partial visualization of a newly enlarged infiltrative appearing
left lower para-aortic lymph node. Consider CT abdomen/pelvis with
IV and oral contrast for further characterization.
5. Cystic pancreatic neck 1.6 cm mass without high risk MRI
features, slightly decreased in size in the interval. Annual MRI
abdomen without and with IV contrast follow-up is advised for 5
years. This recommendation follows ACR consensus guidelines:
Management of Incidental Pancreatic Cysts: A White Paper of the ACR
Incidental Findings Committee. [HOSPITAL] [id number];[DATE].
6. Aortic atherosclerosis.
